# Patient Record
Sex: Female | Born: 1990
Health system: Southern US, Community
[De-identification: ages and names within clinical notes are randomized; demographics above are authoritative.]

## PROBLEM LIST (undated history)

## (undated) DIAGNOSIS — G43909 Migraine, unspecified, not intractable, without status migrainosus: Secondary | ICD-10-CM

## (undated) DIAGNOSIS — F32A Depression, unspecified: Secondary | ICD-10-CM

## (undated) DIAGNOSIS — G479 Sleep disorder, unspecified: Secondary | ICD-10-CM

## (undated) DIAGNOSIS — F329 Major depressive disorder, single episode, unspecified: Secondary | ICD-10-CM

## (undated) DIAGNOSIS — I2699 Other pulmonary embolism without acute cor pulmonale: Secondary | ICD-10-CM

## (undated) DIAGNOSIS — D219 Benign neoplasm of connective and other soft tissue, unspecified: Secondary | ICD-10-CM

## (undated) DIAGNOSIS — M549 Dorsalgia, unspecified: Secondary | ICD-10-CM

## (undated) DIAGNOSIS — K219 Gastro-esophageal reflux disease without esophagitis: Secondary | ICD-10-CM

## (undated) DIAGNOSIS — R0602 Shortness of breath: Secondary | ICD-10-CM

## (undated) DIAGNOSIS — R7303 Prediabetes: Secondary | ICD-10-CM

## (undated) DIAGNOSIS — E119 Type 2 diabetes mellitus without complications: Secondary | ICD-10-CM

## (undated) DIAGNOSIS — H43399 Other vitreous opacities, unspecified eye: Secondary | ICD-10-CM

## (undated) DIAGNOSIS — M542 Cervicalgia: Secondary | ICD-10-CM

## (undated) DIAGNOSIS — I10 Essential (primary) hypertension: Secondary | ICD-10-CM

## (undated) DIAGNOSIS — R5383 Other fatigue: Secondary | ICD-10-CM

## (undated) DIAGNOSIS — F419 Anxiety disorder, unspecified: Secondary | ICD-10-CM

## (undated) DIAGNOSIS — B999 Unspecified infectious disease: Secondary | ICD-10-CM

## (undated) HISTORY — DX: Gastro-esophageal reflux disease without esophagitis: K21.9

## (undated) HISTORY — DX: Cervicalgia: M54.2

## (undated) HISTORY — DX: Dorsalgia, unspecified: M54.9

## (undated) HISTORY — DX: Essential (primary) hypertension: I10

## (undated) HISTORY — DX: Shortness of breath: R06.02

## (undated) HISTORY — DX: Depression, unspecified: F32.A

## (undated) HISTORY — DX: Other fatigue: R53.83

## (undated) HISTORY — DX: Major depressive disorder, single episode, unspecified: F32.9

## (undated) HISTORY — DX: Other vitreous opacities, unspecified eye: H43.399

## (undated) HISTORY — DX: Prediabetes: R73.03

## (undated) HISTORY — PX: WISDOM TOOTH EXTRACTION: SHX21

## (undated) HISTORY — DX: Sleep disorder, unspecified: G47.9

## (undated) HISTORY — DX: Anxiety disorder, unspecified: F41.9

---

## 2009-08-17 ENCOUNTER — Emergency Department (HOSPITAL_BASED_OUTPATIENT_CLINIC_OR_DEPARTMENT_OTHER): Admission: EM | Admit: 2009-08-17 | Discharge: 2009-08-17 | Payer: Self-pay | Admitting: Emergency Medicine

## 2010-10-22 ENCOUNTER — Emergency Department (HOSPITAL_BASED_OUTPATIENT_CLINIC_OR_DEPARTMENT_OTHER)
Admission: EM | Admit: 2010-10-22 | Discharge: 2010-10-22 | Disposition: A | Discharge status: Home / Self Care | Payer: Self-pay | Attending: Emergency Medicine | Admitting: Emergency Medicine

## 2010-10-22 DIAGNOSIS — J45909 Unspecified asthma, uncomplicated: Secondary | ICD-10-CM | POA: Insufficient documentation

## 2010-10-22 DIAGNOSIS — R109 Unspecified abdominal pain: Secondary | ICD-10-CM | POA: Insufficient documentation

## 2010-10-22 LAB — URINALYSIS, ROUTINE W REFLEX MICROSCOPIC
Bilirubin Urine: NEGATIVE
Glucose, UA: NEGATIVE mg/dL
Hgb urine dipstick: NEGATIVE
Ketones, ur: NEGATIVE mg/dL
Protein, ur: NEGATIVE mg/dL
pH: 6 (ref 5.0–8.0)

## 2010-10-22 LAB — WET PREP, GENITAL: Trich, Wet Prep: NONE SEEN

## 2010-10-24 LAB — GC/CHLAMYDIA PROBE AMP, GENITAL
Chlamydia, DNA Probe: NEGATIVE
GC Probe Amp, Genital: NEGATIVE

## 2011-07-22 ENCOUNTER — Encounter: Payer: Self-pay | Admitting: Emergency Medicine

## 2011-07-22 ENCOUNTER — Emergency Department (HOSPITAL_BASED_OUTPATIENT_CLINIC_OR_DEPARTMENT_OTHER)
Admission: EM | Admit: 2011-07-22 | Discharge: 2011-07-22 | Disposition: A | Discharge status: Home / Self Care | Payer: Self-pay | Attending: Emergency Medicine | Admitting: Emergency Medicine

## 2011-07-22 DIAGNOSIS — A499 Bacterial infection, unspecified: Secondary | ICD-10-CM | POA: Insufficient documentation

## 2011-07-22 DIAGNOSIS — J45909 Unspecified asthma, uncomplicated: Secondary | ICD-10-CM | POA: Insufficient documentation

## 2011-07-22 DIAGNOSIS — N760 Acute vaginitis: Secondary | ICD-10-CM | POA: Insufficient documentation

## 2011-07-22 DIAGNOSIS — B9689 Other specified bacterial agents as the cause of diseases classified elsewhere: Secondary | ICD-10-CM | POA: Insufficient documentation

## 2011-07-22 LAB — URINALYSIS, ROUTINE W REFLEX MICROSCOPIC
Bilirubin Urine: NEGATIVE
Ketones, ur: 15 mg/dL — AB
Nitrite: NEGATIVE
Protein, ur: NEGATIVE mg/dL
pH: 7 (ref 5.0–8.0)

## 2011-07-22 LAB — URINE MICROSCOPIC-ADD ON

## 2011-07-22 LAB — WET PREP, GENITAL

## 2011-07-22 MED ORDER — HYDROCODONE-ACETAMINOPHEN 5-500 MG PO TABS: 1.0000 | ORAL_TABLET | Freq: Four times a day (QID) | ORAL | Status: AC | PRN

## 2011-07-22 MED ORDER — METRONIDAZOLE 500 MG PO TABS: 500.0000 mg | ORAL_TABLET | Freq: Two times a day (BID) | ORAL | Status: AC

## 2011-07-22 NOTE — ED Notes (Signed)
Pt c/o RLQ for " a month or two"; sts pain is getting worse; +vag d/c; denies urinary sx

## 2011-07-22 NOTE — ED Provider Notes (Signed)
History     CSN: [id number]  Arrival date & time 07/22/11  1043   First MD Initiated Contact with Patient 07/22/11 1202      Chief Complaint  Patient presents with  . Abdominal Pain    (Consider location/radiation/quality/duration/timing/severity/associated sxs/prior treatment) HPI Comments: Pt states that she has had intermittent rlq abdominal pain for 2 months:pt states that the pain was bad this morning and that is what made her come in today:pt states that she is having very little pain at this time:pt denies fever,diarrhea, or anorexia:pt states that she is having a white vaginal discharge  Patient is a 21 y.o. female presenting with abdominal pain. The history is provided by the patient.  Abdominal Pain The primary symptoms of the illness include abdominal pain and vaginal discharge. The primary symptoms of the illness do not include nausea, vomiting, dysuria or vaginal bleeding. The current episode started more than 2 days ago. The onset of the illness was gradual. The problem has not changed since onset. The vaginal discharge is not associated with dysuria.  The patient states that she believes she is currently not pregnant. The patient has not had a change in bowel habit. Symptoms associated with the illness do not include urgency, hematuria, frequency or back pain. Significant associated medical issues do not include PUD or liver disease.    Past Medical History  Diagnosis Date  . Asthma     Past Surgical History  Procedure Date  . Cesarean section     No family history on file.  History  Substance Use Topics  . Smoking status: Never Smoker   . Smokeless tobacco: Not on file  . Alcohol Use: No    OB History    Grav Para Term Preterm Abortions TAB SAB Ect Mult Living                  Review of Systems  Gastrointestinal: Positive for abdominal pain. Negative for nausea and vomiting.  Genitourinary: Positive for vaginal discharge. Negative for dysuria,  urgency, frequency, hematuria and vaginal bleeding.  Musculoskeletal: Negative for back pain.  All other systems reviewed and are negative.    Allergies  Review of patient's allergies indicates no known allergies.  Home Medications  No current outpatient prescriptions on file.  BP 125/75  Pulse 77  Temp(Src) 98.5 F (36.9 C) (Oral)  Resp 18  Ht 5\' 2"  (1.575 m)  Wt 202 lb (91.627 kg)  BMI 36.95 kg/m2  SpO2 100%  Physical Exam  Nursing note and vitals reviewed. Constitutional: She is oriented to person, place, and time. She appears well-developed and well-nourished.  HENT:  Head: Normocephalic and atraumatic.  Neck: Normal range of motion. Neck supple.  Cardiovascular: Normal rate and regular rhythm.   Pulmonary/Chest: Effort normal and breath sounds normal.  Abdominal: Soft. Bowel sounds are normal. There is no tenderness. There is no rebound and no guarding.  Genitourinary: Cervix exhibits no motion tenderness. Vaginal discharge found.  Musculoskeletal: Normal range of motion.  Neurological: She is alert and oriented to person, place, and time.  Skin: Skin is warm and dry.  Psychiatric: She has a normal mood and affect.    ED Course  Procedures (including critical care time)  Labs Reviewed  URINALYSIS, ROUTINE W REFLEX MICROSCOPIC - Abnormal; Notable for the following:    Color, Urine AMBER (*) BIOCHEMICALS MAY BE AFFECTED BY COLOR   APPearance CLOUDY (*)    Specific Gravity, Urine 1.031 (*)    Ketones, ur 15 (*)  Leukocytes, UA TRACE (*)    All other components within normal limits  WET PREP, GENITAL - Abnormal; Notable for the following:    Clue Cells, Wet Prep TOO NUMEROUS TO COUNT (*)    WBC, Wet Prep HPF POC MANY (*)    All other components within normal limits  URINE MICROSCOPIC-ADD ON - Abnormal; Notable for the following:    Squamous Epithelial / LPF MANY (*)    Bacteria, UA MANY (*)    All other components within normal limits  PREGNANCY, URINE    GC/CHLAMYDIA PROBE AMP, GENITAL   No results found.   1. BV (bacterial vaginosis)       MDM  Will treat for bv,urine contaminated:abdominal exam non acute:pt can follow up with [facility] for continued symptoms        Teressa Lower, NP 07/22/11 1359

## 2011-07-23 NOTE — ED Provider Notes (Signed)
Medical screening examination/treatment/procedure(s) were performed by non-physician practitioner and as supervising physician I was immediately available for consultation/collaboration.   Allante Beane A. Riot Barrick, MD 07/23/11 0701 

## 2011-07-24 LAB — GC/CHLAMYDIA PROBE AMP, GENITAL: Chlamydia, DNA Probe: NEGATIVE

## 2011-11-06 ENCOUNTER — Emergency Department (HOSPITAL_BASED_OUTPATIENT_CLINIC_OR_DEPARTMENT_OTHER)
Admission: EM | Admit: 2011-11-06 | Discharge: 2011-11-06 | Disposition: A | Discharge status: Home / Self Care | Payer: Self-pay | Attending: Emergency Medicine | Admitting: Emergency Medicine

## 2011-11-06 ENCOUNTER — Encounter (HOSPITAL_BASED_OUTPATIENT_CLINIC_OR_DEPARTMENT_OTHER): Payer: Self-pay | Admitting: Family Medicine

## 2011-11-06 DIAGNOSIS — J45909 Unspecified asthma, uncomplicated: Secondary | ICD-10-CM | POA: Insufficient documentation

## 2011-11-06 DIAGNOSIS — R109 Unspecified abdominal pain: Secondary | ICD-10-CM | POA: Insufficient documentation

## 2011-11-06 LAB — URINALYSIS, ROUTINE W REFLEX MICROSCOPIC
Bilirubin Urine: NEGATIVE
Glucose, UA: NEGATIVE mg/dL
Hgb urine dipstick: NEGATIVE
Protein, ur: NEGATIVE mg/dL
Specific Gravity, Urine: 1.031 — ABNORMAL HIGH (ref 1.005–1.030)

## 2011-11-06 MED ORDER — OXYCODONE-ACETAMINOPHEN 5-325 MG PO TABS: 1.0000 | ORAL_TABLET | ORAL | Status: AC | PRN

## 2011-11-06 MED ORDER — IBUPROFEN 200 MG PO TABS
ORAL_TABLET | ORAL | Status: AC
  Filled 2011-11-06: qty 2

## 2011-11-06 MED ORDER — ONDANSETRON 8 MG PO TBDP
8.0000 mg | ORAL_TABLET | Freq: Once | ORAL | Status: AC
  Administered 2011-11-06: 8 mg via ORAL
  Filled 2011-11-06: qty 1

## 2011-11-06 MED ORDER — IBUPROFEN 400 MG PO TABS
ORAL_TABLET | ORAL | Status: AC
  Administered 2011-11-06: 800 mg
  Filled 2011-11-06: qty 2

## 2011-11-06 MED ORDER — OXYCODONE-ACETAMINOPHEN 5-325 MG PO TABS
2.0000 | ORAL_TABLET | Freq: Once | ORAL | Status: AC
  Administered 2011-11-06: 2 via ORAL
  Filled 2011-11-06: qty 2

## 2011-11-06 NOTE — ED Provider Notes (Signed)
History     CSN: [id number]  Arrival date & time 11/06/11  2130   First MD Initiated Contact with Patient 11/06/11 [phone]      Chief Complaint  Patient presents with  . Flank Pain     Patient is a 21 y.o. female presenting with flank pain. The history is provided by the patient.  Flank Pain This is a recurrent problem. The problem occurs hourly. The problem has not changed since onset.Associated symptoms include abdominal pain. Pertinent negatives include no chest pain, no headaches and no shortness of breath. Exacerbated by: palpation. The symptoms are relieved by nothing. She has tried nothing for the symptoms.  pt reports right flank and abdominal pain for "awhile" now worse today No vomiting/diarrhea She reports currently on menstrual cycle No fever, no dysuria   Past Medical History  Diagnosis Date  . Asthma     Past Surgical History  Procedure Date  . Cesarean section     No family history on file.  History  Substance Use Topics  . Smoking status: Never Smoker   . Smokeless tobacco: Not on file  . Alcohol Use: No    OB History    Grav Para Term Preterm Abortions TAB SAB Ect Mult Living                  Review of Systems  Respiratory: Negative for shortness of breath.   Cardiovascular: Negative for chest pain.  Gastrointestinal: Positive for abdominal pain.  Genitourinary: Positive for flank pain.  Neurological: Negative for headaches.    Allergies  Review of patient's allergies indicates no known allergies.  Home Medications   Current Outpatient Rx  Name Route Sig Dispense Refill  . IBUPROFEN 800 MG PO TABS Oral Take 800 mg by mouth every 8 (eight) hours as needed.      BP 133/85  Pulse 76  Temp(Src) 98.7 F (37.1 C) (Oral)  Resp 20  Ht 5\' 2"  (1.575 m)  Wt 194 lb (87.998 kg)  BMI 35.48 kg/m2  SpO2 100%  Physical Exam CONSTITUTIONAL: Well developed/well nourished HEAD AND FACE: Normocephalic/atraumatic EYES: EOMI/PERRL ENMT: Mucous  membranes moist NECK: supple no meningeal signs SPINE:entire spine nontender CV: S1/S2 noted, no murmurs/rubs/gallops noted LUNGS: Lungs are clear to auscultation bilaterally, no apparent distress ABDOMEN: soft, nontender, no rebound or guarding GU:no cva tenderness Chaperone present.  No cmt.  No adnexal mass/tenderness noted.  No vag bleeding NEURO: Pt is awake/alert, moves all extremitiesx4 EXTREMITIES: pulses normal, full ROM SKIN: warm, color normal PSYCH: no abnormalities of mood noted  ED Course  Procedures Labs Reviewed  URINALYSIS, ROUTINE W REFLEX MICROSCOPIC - Abnormal; Notable for the following:    Specific Gravity, Urine 1.031 (*)    All other components within normal limits  PREGNANCY, URINE   9:43 AM Pt resting comfortably watching Tv Suspicion for acute abd/gyn process is low  The patient appears reasonably screened and/or stabilized for discharge and I doubt any other medical condition or other [facility] requiring further screening, evaluation, or treatment in the ED at this time prior to discharge.  Pt did not take percocet, order changed to ibuprofen  MDM  Nursing notes reviewed and considered in documentation All labs/vitals reviewed and considered Previous records reviewed and considered         Joya Gaskins, MD 11/06/11 [phone]

## 2011-11-06 NOTE — Discharge Instructions (Signed)
Abdominal (belly) pain can be caused by many things. any cases can be observed and treated at home after initial evaluation in the emergency department. Even though you are being discharged home, abdominal pain can be unpredictable. Therefore, you need a repeated exam if your pain does not resolve, returns, or worsens. Most patients with abdominal pain don't have to be admitted to the hospital or have surgery, but serious problems like appendicitis and gallbladder attacks can start out as nonspecific pain. Many abdominal conditions cannot be diagnosed in one visit, so follow-up evaluations are very important. SEEK IMMEDIATE MEDICAL ATTENTION IF: The pain does not go away or becomes severe, particularly over the next 8-12 hours.  A temperature above 100.56F develops.  Repeated vomiting occurs (multiple episodes).  The pain becomes localized to portions of the abdomen. The right side could possibly be appendicitis. In an adult, the left lower portion of the abdomen could be colitis or diverticulitis.  Blood is being passed in stools or vomit (bright red or black tarry stools).  Return also if you develop chest pain, difficulty breathing, dizziness or fainting, or become confused, poorly responsive, or inconsolable (young children).  RESOURCE GUIDE  Dental Problems  Patients with Medicaid: [facility] [phone] [address]                                           [phone] [address] Phone:  [phone]                                                  Phone:  [phone]  If unable to pay or uninsured, contact:  [facility] or [facility]. to become qualified for the adult dental clinic.  Chronic Pain Problems Contact [facility]  [phone] Patients need to be referred by their primary care doctor.  Insufficient Money for Medicine Contact [employer]:  call "211" or [facility] [phone].  No Primary Care  Doctor Call [facility]  [phone] Other agencies that provide inexpensive medical care    [facility]  [phone]    [facility]  [phone]    [facility]  [phone]    [facility]  [phone]    [facility]  [phone]    [facility]  [phone]  Psychological Services [facility]  [phone] [facility]  [phone] [facility]   [phone] (emergency services [phone])  Substance Abuse Resources [facility]  [phone] [facility] 7058282639 [facility] [phone] Floydene Flock [phone] Residential & Outpatient Substance Abuse Program  [phone]  Abuse/Neglect [facility] Child Abuse Hotline 279-525-6061 [facility] Child Abuse Hotline [phone] (After Hours)  Emergency Shelter [facility] [phone]  Maternity Homes [facility] [place] [facility] [phone] [facility] [phone]  MRSA Hotline #:   (520) 503-5172    [facility]  [facility] Dept. [address]      [place] Phone:  [phone]                                   Phone:  [phone]                 Phone:  [place] Phone:  [place] [phone] (After Hours)

## 2011-11-06 NOTE — ED Notes (Signed)
Pt had percocet ordered package opened pt tell RN she is driving and doesn't have a ride verified with MD order changed to ibuprofen 800 mg

## 2011-11-06 NOTE — ED Notes (Signed)
Pt c/o right flank pain "for a while" but worse today. Pt sts she stopped depo and started her period on Saturday. Pt reports "cramping type pain intermittent".

## 2011-11-07 LAB — GC/CHLAMYDIA PROBE AMP, GENITAL
Chlamydia, DNA Probe: NEGATIVE
GC Probe Amp, Genital: NEGATIVE

## 2013-01-27 ENCOUNTER — Emergency Department (HOSPITAL_BASED_OUTPATIENT_CLINIC_OR_DEPARTMENT_OTHER)
Admission: EM | Admit: 2013-01-27 | Discharge: 2013-01-27 | Disposition: A | Discharge status: Home / Self Care | Payer: Self-pay | Attending: Emergency Medicine | Admitting: Emergency Medicine

## 2013-01-27 ENCOUNTER — Encounter (HOSPITAL_BASED_OUTPATIENT_CLINIC_OR_DEPARTMENT_OTHER): Payer: Self-pay | Admitting: Family Medicine

## 2013-01-27 DIAGNOSIS — N949 Unspecified condition associated with female genital organs and menstrual cycle: Secondary | ICD-10-CM | POA: Insufficient documentation

## 2013-01-27 DIAGNOSIS — M549 Dorsalgia, unspecified: Secondary | ICD-10-CM | POA: Insufficient documentation

## 2013-01-27 DIAGNOSIS — J45909 Unspecified asthma, uncomplicated: Secondary | ICD-10-CM | POA: Insufficient documentation

## 2013-01-27 DIAGNOSIS — R102 Pelvic and perineal pain: Secondary | ICD-10-CM

## 2013-01-27 DIAGNOSIS — Z9889 Other specified postprocedural states: Secondary | ICD-10-CM | POA: Insufficient documentation

## 2013-01-27 DIAGNOSIS — N898 Other specified noninflammatory disorders of vagina: Secondary | ICD-10-CM | POA: Insufficient documentation

## 2013-01-27 DIAGNOSIS — Z3202 Encounter for pregnancy test, result negative: Secondary | ICD-10-CM | POA: Insufficient documentation

## 2013-01-27 DIAGNOSIS — R112 Nausea with vomiting, unspecified: Secondary | ICD-10-CM | POA: Insufficient documentation

## 2013-01-27 LAB — WET PREP, GENITAL: Yeast Wet Prep HPF POC: NONE SEEN

## 2013-01-27 LAB — URINALYSIS, ROUTINE W REFLEX MICROSCOPIC
Nitrite: NEGATIVE
Specific Gravity, Urine: 1.031 — ABNORMAL HIGH (ref 1.005–1.030)
Urobilinogen, UA: 1 mg/dL (ref 0.0–1.0)
pH: 5.5 (ref 5.0–8.0)

## 2013-01-27 LAB — PREGNANCY, URINE: Preg Test, Ur: NEGATIVE

## 2013-01-27 MED ORDER — FLUCONAZOLE 50 MG PO TABS
150.0000 mg | ORAL_TABLET | Freq: Once | ORAL | Status: AC
  Administered 2013-01-27: 150 mg via ORAL
  Filled 2013-01-27: qty 1

## 2013-01-27 NOTE — ED Notes (Signed)
Pt c/o cramping x 3 wks to abdomen and low back.

## 2013-01-27 NOTE — ED Provider Notes (Addendum)
History     This chart was scribed for Danielle Sprout, MD by Jiles Prows, ED Scribe. The patient was seen in room MH03/MH03 and the patient's care was started at 4:02 PM.  CSN: [id number] Arrival date & time 01/27/13  1544   Chief Complaint  Patient presents with  . Abdominal Cramping   The history is provided by the patient and medical records. No language interpreter was used.   HPI Comments: Danielle Garcia is a 22 y.o. female who presents to the Emergency Department complaining of intermittent, moderate cramping to abdomen that radiates to lower back onset 3 weeks ago.  Pt reports that episodes last about 15 minutes and have about an hour between episodes.  Pt reports LMP in October after her Depo shot.  Pt reports she was supposed to get another Depo shot in January, but has not.  Pt reports she vomited once last week.  She states that she had a negative pregnancy test last week.  She has one child, (2 years ago), with normal pregnancy.  Pt denies headache, diaphoresis, fever, chills, diarrhea, weakness, cough, SOB and any other pain.   Past Medical History  Diagnosis Date  . Asthma    Past Surgical History  Procedure Laterality Date  . Cesarean section     No family history on file. History  Substance Use Topics  . Smoking status: Never Smoker   . Smokeless tobacco: Not on file  . Alcohol Use: No   OB History   Grav Para Term Preterm Abortions TAB SAB Ect Mult Living                 Review of Systems  Gastrointestinal: Positive for nausea, vomiting and abdominal pain.  Genitourinary: Positive for flank pain.  Musculoskeletal: Positive for back pain.   A complete 10 system review of systems was obtained and all systems are negative except as noted in the HPI and PMH.   Allergies  Review of patient's allergies indicates no known allergies.  Home Medications   Current Outpatient Rx  Name  Route  Sig  Dispense  Refill  . ibuprofen (ADVIL,MOTRIN) 800 MG tablet    Oral   Take 800 mg by mouth every 8 (eight) hours as needed.          BP 137/83  Pulse 88  Temp(Src) 98.6 F (37 C) (Oral)  Resp 18  Ht 5\' 2"  (1.575 m)  Wt 200 lb (90.719 kg)  BMI 36.57 kg/m2  SpO2 100% Physical Exam  Nursing note and vitals reviewed. Constitutional: She is oriented to person, place, and time. She appears well-developed and well-nourished. No distress.  HENT:  Head: Normocephalic and atraumatic.  Eyes: EOM are normal.  Neck: Neck supple. No tracheal deviation present.  Cardiovascular: Normal rate.   Pulmonary/Chest: Effort normal. No respiratory distress.  Abdominal: She exhibits no mass. There is tenderness (lower pelvic). There is no rebound.  Mild superpubic tenderness.  Genitourinary: Uterus normal. Cervix exhibits motion tenderness and friability. Right adnexum displays no mass, no tenderness and no fullness. Left adnexum displays no mass, no tenderness and no fullness. Vaginal discharge found.  Curd-like vaginal discharge  Musculoskeletal: Normal range of motion.  Neurological: She is alert and oriented to person, place, and time.  Skin: Skin is warm and dry.  Psychiatric: She has a normal mood and affect. Her behavior is normal.    ED Course  Procedures (including critical care time) DIAGNOSTIC STUDIES: Oxygen Saturation is 100% on RA, normal  by my interpretation.    COORDINATION OF CARE: 4:05 PM - Discussed ED treatment with pt at bedside including urinalysis and pelvic and pt agrees.   4:31 PM - Pelvic Exam: Chaperone present.  Irritation of vaginal canal.  Mild white curd like discharge with no bleeding.  No adnexal masses or tenderness.  No cervical motion tenderness.  Diffuse mild pelvic tenderness.  Labs Reviewed  WET PREP, GENITAL - Abnormal; Notable for the following:    WBC, Wet Prep HPF POC FEW (*)    All other components within normal limits  URINALYSIS, ROUTINE W REFLEX MICROSCOPIC - Abnormal; Notable for the following:    Specific  Gravity, Urine 1.031 (*)    All other components within normal limits  GC/CHLAMYDIA PROBE AMP  PREGNANCY, URINE   No results found. 1. Pelvic pain     MDM   Patient with intermittent lower abdominal cramping that's been ongoing for the last 3 weeks. She had depo in October and has not had a menstrual cycle since even though she did not get a repeat Depakote shot in January. She has only one sexual partner which is only occasionally productive. She denies any vaginal discharge or dysuria. On pelvic exam she has irritation in curd-like discharge in her vaginal vault consistent with yeast but no other significant findings. She has no findings suggestive of PID.  Prep without any clue cells and only few white blood cells low suspicion for STD at this time. We'll treat with Diflucan and Monistat. We'll outpatient followup with OB/GYN if symptoms continue  I personally performed the services described in this documentation, which was scribed in my presence.  The recorded information has been reviewed and considered.   Danielle Sprout, MD 01/27/13 1610  Danielle Sprout, MD 01/27/13 1710

## 2016-09-07 DIAGNOSIS — E559 Vitamin D deficiency, unspecified: Secondary | ICD-10-CM | POA: Diagnosis not present

## 2016-09-07 DIAGNOSIS — I10 Essential (primary) hypertension: Secondary | ICD-10-CM | POA: Diagnosis not present

## 2016-09-07 DIAGNOSIS — R51 Headache: Secondary | ICD-10-CM | POA: Diagnosis not present

## 2016-09-13 MED FILL — LISINOPRIL-HCTZ 20-12.5 MG: 20-12.5 | 90 days supply | Qty: 90 | Fill #0

## 2016-09-25 ENCOUNTER — Encounter: Payer: Self-pay | Admitting: Medical

## 2016-09-25 ENCOUNTER — Ambulatory Visit (INDEPENDENT_AMBULATORY_CARE_PROVIDER_SITE_OTHER): Payer: Self-pay | Admitting: Medical

## 2016-09-25 VITALS — BP 108/72 | HR 84 | Temp 98.1°F | Resp 16 | Ht 62.0 in | Wt 217.0 lb

## 2016-09-25 DIAGNOSIS — R110 Nausea: Secondary | ICD-10-CM

## 2016-09-25 DIAGNOSIS — R142 Eructation: Secondary | ICD-10-CM

## 2016-09-25 DIAGNOSIS — H9201 Otalgia, right ear: Secondary | ICD-10-CM

## 2016-09-25 DIAGNOSIS — R42 Dizziness and giddiness: Secondary | ICD-10-CM

## 2016-09-25 DIAGNOSIS — R1013 Epigastric pain: Secondary | ICD-10-CM

## 2016-09-25 LAB — CBC WITH DIFFERENTIAL/PLATELET
Basophils Absolute: 0 10*3/uL (ref 0.0–0.1)
Basophils Relative: 0.4 % (ref 0.0–3.0)
EOS PCT: 1 % (ref 0.0–5.0)
Eosinophils Absolute: 0 10*3/uL (ref 0.0–0.7)
HCT: 43 % (ref 36.0–46.0)
Hemoglobin: 14.2 g/dL (ref 12.0–15.0)
LYMPHS ABS: 2.6 10*3/uL (ref 0.7–4.0)
Lymphocytes Relative: 61.2 % — ABNORMAL HIGH (ref 12.0–46.0)
MCHC: 33.1 g/dL (ref 30.0–36.0)
MCV: 92 fl (ref 78.0–100.0)
MONO ABS: 0.5 10*3/uL (ref 0.1–1.0)
MONOS PCT: 10.9 % (ref 3.0–12.0)
NEUTROS ABS: 1.1 10*3/uL — AB (ref 1.4–7.7)
Neutrophils Relative %: 26.5 % — ABNORMAL LOW (ref 43.0–77.0)
PLATELETS: 315 10*3/uL (ref 150.0–400.0)
RBC: 4.67 Mil/uL (ref 3.87–5.11)
RDW: 13.6 % (ref 11.5–15.5)
WBC: 4.2 10*3/uL (ref 4.0–10.5)

## 2016-09-25 LAB — COMPREHENSIVE METABOLIC PANEL
ALK PHOS: 44 U/L (ref 39–117)
ALT: 13 U/L (ref 0–35)
AST: 19 U/L (ref 0–37)
Albumin: 4.3 g/dL (ref 3.5–5.2)
BUN: 11 mg/dL (ref 6–23)
CHLORIDE: 99 meq/L (ref 96–112)
CO2: 28 meq/L (ref 19–32)
Calcium: 9.6 mg/dL (ref 8.4–10.5)
Creatinine, Ser: 0.85 mg/dL (ref 0.40–1.20)
GFR: 103.51 mL/min (ref >60.00)
GLUCOSE: 105 mg/dL — AB (ref 70–99)
POTASSIUM: 3.2 meq/L — AB (ref 3.5–5.1)
SODIUM: 135 meq/L (ref 135–145)
Total Bilirubin: 0.6 mg/dL (ref 0.2–1.2)
Total Protein: 7.6 g/dL (ref 6.0–8.3)

## 2016-09-25 LAB — POC URINALSYSI DIPSTICK (AUTOMATED)
Blood, UA: NEGATIVE
GLUCOSE UA: NEGATIVE
LEUKOCYTES UA: NEGATIVE
Nitrite, UA: NEGATIVE
UROBILINOGEN UA: 4 — AB
pH, UA: 6

## 2016-09-25 LAB — POCT URINE PREGNANCY: PREG TEST UR: NEGATIVE

## 2016-09-25 LAB — LIPASE: Lipase: 26 U/L (ref 11.0–59.0)

## 2016-09-25 LAB — AMYLASE: Amylase: 39 U/L (ref 27–131)

## 2016-09-25 MED ORDER — ONDANSETRON 8 MG PO TBDP: 8.0000 mg | ORAL_TABLET | Freq: Three times a day (TID) | ORAL | 0 refills | Status: DC | PRN

## 2016-09-25 MED ORDER — RANITIDINE HCL 150 MG PO CAPS: 150.0000 mg | ORAL_CAPSULE | Freq: Two times a day (BID) | ORAL | 0 refills | Status: DC

## 2016-09-25 MED ORDER — GI COCKTAIL ~~LOC~~
30.0000 mL | Freq: Once | ORAL | Status: AC
  Administered 2016-09-25: 30 mL via ORAL

## 2016-09-25 MED ORDER — AMOXICILLIN-POT CLAVULANATE 875-125 MG PO TABS: 1.0000 | ORAL_TABLET | Freq: Two times a day (BID) | ORAL | 0 refills | Status: DC

## 2016-09-25 MED ORDER — FLUTICASONE PROPIONATE 50 MCG/ACT NA SUSP: 2.0000 | Freq: Every day | NASAL | 1 refills | Status: DC

## 2016-09-25 MED FILL — raNITIdine HCL 150 MG TABS: 150 | 30 days supply | Qty: 60 | Fill #0

## 2016-09-25 MED FILL — ONDANSETRON ODT 8 MG TABLET: 8 | 6 days supply | Qty: 20 | Fill #0

## 2016-09-25 MED FILL — FLUTICASONE PROP 50 MCG SPR: 50 | 30 days supply | Qty: 16 | Fill #0

## 2016-09-25 NOTE — Progress Notes (Signed)
Pre visit review using our clinic review tool, if applicable. No additional management support is needed unless otherwise documented below in the visit note/SLS  

## 2016-09-25 NOTE — Patient Instructions (Addendum)
Your abdomen pain has features of gerd. You mentioned getting labs to [facility] and I think may be good idea as well. Will get cbc, cmp amylase, lipase and h pylori breath test.  GI cocktail today. Rx ranitidine to take twice daily. If not improving by mid week we could switch to ppi.(eat very healthy diet)  For your ear pain this may be related to pressure in the eustachian tube as you appear to have some swelling inside nose and pnd. Rx flonase. If you have severe ear pain again would advise starting augmentin as upper 1/3 of membrane has faint red appearance(But entire membrane not red presently).  Follow up in 7-10 days or as needed

## 2016-09-25 NOTE — Progress Notes (Signed)
Subjective:    Patient ID: Danielle Garcia, female    DOB: 1991/01/07, 26 y.o.   MRN: [id number]  HPI  Pt in with some recent abdomen pain since Saturday. Pain on eating(regardless of food type). Pt feels nausea even without eating. No vomiting. Nausea started this am.(lmp- 09-18-2016 and little early/lasted about the same and seemed normal). No vomiting. Pt states pregnant(6 yrs ago) with daughter had heart burn/reflux.  Pt does states episodes of faint mild epigastric pain with burping. Today on exam burping a lot.   Pt has not taken anything otc for her symptoms.  No constipation or diarrhea. No black or bloody stools.   Some ear pain rt ear pain since yesterday with some dizziness that has been transient on and off. Worse with changing positions sitting to standing. Regains balance after 10 seconds. During exam sitting feels no dizziness. No recent nasal congestion. But then states some sneezing. No history of ear infections.  Pt states ear pain severe last night but faint now.  Review of Systems  Constitutional: Negative for chills and fever.  HENT: Positive for ear pain and sneezing. Negative for congestion and hearing loss.   Respiratory: Negative for cough, chest tightness, shortness of breath and wheezing.   Cardiovascular: Negative for chest pain and palpitations.  Gastrointestinal: Positive for abdominal pain and nausea. Negative for anal bleeding, blood in stool, constipation and diarrhea.       Belching.  Genitourinary: Negative for difficulty urinating, dyspareunia, flank pain, frequency, pelvic pain, urgency and vaginal pain.  Musculoskeletal: Negative for back pain.  Skin: Negative for rash.  Neurological: Positive for dizziness. Negative for weakness and headaches.       Faint and transient. See hpi.  Hematological: Negative for adenopathy. Does not bruise/bleed easily.  Psychiatric/Behavioral: Negative for behavioral problems and confusion.    Past Medical History:    Diagnosis Date  . Asthma   . HTN (hypertension)      Social History   Social History  . Marital status: Single    Spouse name: N/A  . Number of children: N/A  . Years of education: N/A   Occupational History  . Not on file.   Social History Main Topics  . Smoking status: Never Smoker  . Smokeless tobacco: Never Used  . Alcohol use No  . Drug use: No  . Sexual activity: Yes    Birth control/ protection: Implant   Other Topics Concern  . Not on file   Social History Narrative  . No narrative on file    Past Surgical History:  Procedure Laterality Date  . CESAREAN SECTION      Family History  Problem Relation Age of Onset  . Hypertension Mother   . Heart attack Father   . Hypertension Maternal Grandmother   . Diabetes Maternal Grandmother   . Lung cancer Maternal Grandfather   . Diabetes Paternal Grandmother   . Allergies Daughter     No Known Allergies  No current outpatient prescriptions on file prior to visit.   No current facility-administered medications on file prior to visit.     BP 108/72 (BP Location: Left Arm, Patient Position: Sitting, Cuff Size: Large)   Pulse 84   Temp 98.1 F (36.7 C) (Oral)   Resp 16   Ht 5\' 2"  (1.575 m)   Wt 217 lb (98.4 kg)   LMP 09/18/2016   SpO2 99%   BMI 39.69 kg/m       Objective:   Physical Exam  General  Mental Status - Alert. General Appearance - Well groomed. Not in acute distress.  Skin Rashes- No Rashes.  HEENT Head- Normal. Ear Auditory Canal - Left- Normal. Right - Normal.Tympanic Membrane- Left- Normal. Right- upper 1/3 portion tm faint red. Remainder normal and good light reflex. Eye Sclera/Conjunctiva- Left- Normal. Right- Normal. Nose & Sinuses Nasal Mucosa- Left-  Boggy and Congested. Right-  Boggy and  Congested.Bilateral  No maxillary and no  frontal sinus pressure. Mouth & Throat Lips: Upper Lip- Normal: no dryness, cracking, pallor, cyanosis, or vesicular eruption. Lower  Lip-Normal: no dryness, cracking, pallor, cyanosis or vesicular eruption. Buccal Mucosa- Bilateral- No Aphthous ulcers. Oropharynx- No Discharge or Erythema. Tonsils: Characteristics- Bilateral- No Erythema or Congestion. Size/Enlargement- Bilateral- No enlargement. Discharge- bilateral-None.  Neck Neck- Supple. No Masses.   Chest and Lung Exam Auscultation: Breath Sounds:-Clear even and unlabored.  Cardiovascular Auscultation:Rythm- Regular, rate and rhythm. Murmurs & Other Heart Sounds:Ausculatation of the heart reveal- No Murmurs.  Lymphatic Head & Neck General Head & Neck Lymphatics: Bilateral: Description- No Localized lymphadenopathy.    Abdomen Inspection:-Inspection Normal.  Palpation/Perucssion: Palpation and Percussion of the abdomen reveal- faint epigastric Tender(no ruq pain), No Rebound tenderness, No rigidity(Guarding) and No Palpable abdominal masses.  Liver:-Normal.  Spleen:- Normal.       Assessment & Plan:  Your abdomen pain has features of gerd. You mentioned getting labs to [facility] and I think may be good idea as well. Will get cbc, cmp amylase, lipase and h pylori breath test.  GI cocktail today. Rx ranitidine to take twice daily. If not improving by mid week we could switch to ppi.(eat very healthy diet)  For your ear pain this may be related to pressure in the eustachian tube as you appear to have some swelling inside nose and pnd. Rx flonase. If you have severe ear pain again would advise starting augmentin as upper 1/3 of membrane has faint red appearance(But entire membrane not red presently).  Follow up in 7-10 days or as needed  Considered using antivert for dizziness if you get more constant dizziness. Currently I think it would oversedate you.  Will follow labs and pain level. Consider getting [place] this week if symptoms persist.  Labron Bloodgood, Percell Miller, PA-C

## 2016-09-26 ENCOUNTER — Telehealth: Payer: Self-pay | Admitting: Medical

## 2016-09-26 ENCOUNTER — Encounter: Payer: Self-pay | Admitting: *Deleted

## 2016-09-26 LAB — CULTURE, URINE COMPREHENSIVE

## 2016-09-26 LAB — H. PYLORI BREATH TEST: H. PYLORI BREATH TEST: NOT DETECTED

## 2016-09-26 MED ORDER — POTASSIUM CHLORIDE ER 10 MEQ PO TBCR: 10.0000 meq | EXTENDED_RELEASE_TABLET | Freq: Every day | ORAL | 0 refills | Status: DC

## 2016-09-26 MED FILL — POTASSIUM CL 10 MEQ TAB SA: 10 | 5 days supply | Qty: 5 | Fill #0

## 2016-09-26 NOTE — Telephone Encounter (Signed)
Patient informed, understood & agreed/SLS 03/13

## 2016-09-26 NOTE — Telephone Encounter (Signed)
Pt wbc was not elevated. But neutrophils were decrease would recommend repeating cbc in 2 wks when feeling better. Amylase and lipase normal. H pylori was negative. Pt sugar was mild increased and k was mild low. I sent in 5 tabs of kdur to bring k up quickly. How is her stomach pain. If by wed not improved with ranitidine could switch to omeprazole. Also if abdomen pain persists by next week or if even worse now could order fasting [place] evaluate gallbladder.

## 2016-09-28 ENCOUNTER — Telehealth: Payer: Self-pay | Admitting: *Deleted

## 2016-09-28 NOTE — Telephone Encounter (Signed)
Patient requested new MyChart activation information. She misplaced AVS that was given at office visit earlier this week. MyChart activation code sent via text.

## 2016-10-19 MED FILL — PHENTERMINE 37.5 MG TABLET: 37.5 | 30 days supply | Qty: 30 | Fill #0

## 2016-10-26 ENCOUNTER — Telehealth: Payer: Self-pay | Admitting: Medical

## 2016-10-26 ENCOUNTER — Other Ambulatory Visit (INDEPENDENT_AMBULATORY_CARE_PROVIDER_SITE_OTHER): Payer: 59

## 2016-10-26 DIAGNOSIS — R739 Hyperglycemia, unspecified: Secondary | ICD-10-CM | POA: Diagnosis not present

## 2016-10-26 LAB — HEMOGLOBIN A1C: Hgb A1c MFr Bld: 5.4 % (ref 4.6–6.5)

## 2016-10-26 NOTE — Telephone Encounter (Signed)
Sugar earlier today was 160. Will get a1c. Order placed.

## 2016-11-02 MED FILL — IBUPROFEN 800 MG TABLET: 800 | 10 days supply | Qty: 30 | Fill #0

## 2016-11-10 ENCOUNTER — Ambulatory Visit (INDEPENDENT_AMBULATORY_CARE_PROVIDER_SITE_OTHER): Payer: 59 | Admitting: Family Medicine

## 2016-11-10 ENCOUNTER — Encounter: Payer: Self-pay | Admitting: Family Medicine

## 2016-11-10 VITALS — BP 121/87 | HR 60 | Temp 97.9°F | Ht 62.0 in | Wt 217.5 lb

## 2016-11-10 DIAGNOSIS — N3090 Cystitis, unspecified without hematuria: Secondary | ICD-10-CM | POA: Diagnosis not present

## 2016-11-10 DIAGNOSIS — G43809 Other migraine, not intractable, without status migrainosus: Secondary | ICD-10-CM

## 2016-11-10 LAB — POC URINALSYSI DIPSTICK (AUTOMATED)
BILIRUBIN UA: NEGATIVE
GLUCOSE UA: NEGATIVE
Ketones, UA: NEGATIVE
LEUKOCYTES UA: NEGATIVE
NITRITE UA: NEGATIVE
PH UA: 6 (ref 5.0–8.0)
Protein, UA: NEGATIVE
Spec Grav, UA: >=1.03 — AB (ref 1.010–1.025)
Urobilinogen, UA: 0.2 E.U./dL

## 2016-11-10 MED ORDER — KETOROLAC TROMETHAMINE 60 MG/2ML IM SOLN
60.0000 mg | Freq: Once | INTRAMUSCULAR | Status: AC
  Administered 2016-11-10: 60 mg via INTRAMUSCULAR

## 2016-11-10 MED ORDER — SUMATRIPTAN SUCCINATE 100 MG PO TABS: 100.0000 mg | ORAL_TABLET | ORAL | 1 refills | Status: DC | PRN

## 2016-11-10 MED ORDER — NITROFURANTOIN MONOHYD MACRO 100 MG PO CAPS: 100.0000 mg | ORAL_CAPSULE | Freq: Two times a day (BID) | ORAL | 0 refills | Status: DC

## 2016-11-10 MED FILL — NITROFURANTOIN MONO-MCR 100: 100 | 5 days supply | Qty: 10 | Fill #0

## 2016-11-10 MED FILL — SUMATRIPTAN SUCC 100 MG TAB: 100 | 30 days supply | Qty: 9 | Fill #0

## 2016-11-10 NOTE — Progress Notes (Signed)
Chief Complaint  Patient presents with  . Headache    Danielle Garcia is a 26 y.o. female here for possible UTI.  Duration: 1 day. Symptoms: urinary frequency and pain with urination Denies: hematuria, urinary retention, fever, urinary incontinence and flank pain, vaginal discharge Has had UTI's in past, this is typical presentation Hx of recurrent UTI? No Denies new sexual partners.  The patient also has a history of migraines. She has her typical bilateral frontal headache with associated nausea. No numbness, tingling, vision changes, or balance issues. She's tried over-the-counter measures without relief. She used to be on Imitrex in the past, however has not had her prescription recently.  ROS:  Constitutional: denies fever GU: As noted in HPI MSK: Denies back pain Abd: Denies constipation or abdominal pain  Past Medical History:  Diagnosis Date  . Asthma   . HTN (hypertension)    Family History  Problem Relation Age of Onset  . Hypertension Mother   . Heart attack Father   . Hypertension Maternal Grandmother   . Diabetes Maternal Grandmother   . Lung cancer Maternal Grandfather   . Diabetes Paternal Grandmother   . Allergies Daughter    Social History   Social History  . Marital status: Single   Social History Main Topics  . Smoking status: Never Smoker  . Smokeless tobacco: Never Used  . Alcohol use No  . Drug use: No  . Sexual activity: Yes    Birth control/ protection: Implant    BP 121/87   Pulse 60   Temp 97.9 F (36.6 C) (Other (Comment))   Ht 5\' 2"  (1.575 m)   Wt 217 lb 8 oz (98.7 kg)   SpO2 100%   BMI 39.78 kg/m  General: Awake, alert, appears stated age HEENT: MMM Heart: RRR, no murmurs Lungs: CTAB, normal respiratory effort, no accessory muscle usage Abd: BS+, soft, NT, ND, no masses or organomegaly MSK: No CVA tenderness, neg Lloyd's sign, no cerv paraspinal muscle TTP Psych: Age appropriate judgment and insight  Cystitis - Plan:  nitrofurantoin, macrocrystal-monohydrate, (MACROBID) 100 MG capsule  Other migraine without status migrainosus, not intractable - Plan: SUMAtriptan (IMITREX) 100 MG tablet, ketorolac (TORADOL) injection 60 mg  Orders as above. Will treat given symptoms, will culture urine for thoroughness. Toradol today, will give Imitrex for abortive therapy. If having more frequent headaches, will discuss starting prophylaxis. F/u in 7 days if symptoms fail to improve, sooner if things worsen. The patient voiced understanding and agreement to the plan.  [place], DO 11/10/16 4:07 PM

## 2016-11-10 NOTE — Progress Notes (Signed)
Pre visit review using our clinic tool,if applicable. No additional management support is needed unless otherwise documented below in the visit note.  

## 2016-11-12 LAB — URINE CULTURE

## 2016-11-18 DIAGNOSIS — R110 Nausea: Secondary | ICD-10-CM | POA: Diagnosis not present

## 2016-11-18 DIAGNOSIS — J45909 Unspecified asthma, uncomplicated: Secondary | ICD-10-CM | POA: Diagnosis not present

## 2016-11-18 DIAGNOSIS — Z7951 Long term (current) use of inhaled steroids: Secondary | ICD-10-CM | POA: Diagnosis not present

## 2016-11-18 DIAGNOSIS — Z87891 Personal history of nicotine dependence: Secondary | ICD-10-CM | POA: Diagnosis not present

## 2016-11-18 DIAGNOSIS — R42 Dizziness and giddiness: Secondary | ICD-10-CM | POA: Diagnosis not present

## 2016-11-18 DIAGNOSIS — R51 Headache: Secondary | ICD-10-CM | POA: Diagnosis not present

## 2016-11-18 DIAGNOSIS — I10 Essential (primary) hypertension: Secondary | ICD-10-CM | POA: Diagnosis not present

## 2016-11-18 DIAGNOSIS — Z79899 Other long term (current) drug therapy: Secondary | ICD-10-CM | POA: Diagnosis not present

## 2016-11-20 ENCOUNTER — Ambulatory Visit (INDEPENDENT_AMBULATORY_CARE_PROVIDER_SITE_OTHER): Payer: 59 | Admitting: Medical

## 2016-11-20 VITALS — BP 116/72 | HR 79 | Temp 97.9°F | Ht 62.0 in | Wt 216.4 lb

## 2016-11-20 DIAGNOSIS — J301 Allergic rhinitis due to pollen: Secondary | ICD-10-CM

## 2016-11-20 DIAGNOSIS — J0100 Acute maxillary sinusitis, unspecified: Secondary | ICD-10-CM

## 2016-11-20 DIAGNOSIS — H9203 Otalgia, bilateral: Secondary | ICD-10-CM | POA: Diagnosis not present

## 2016-11-20 DIAGNOSIS — R0981 Nasal congestion: Secondary | ICD-10-CM | POA: Diagnosis not present

## 2016-11-20 MED ORDER — AMOXICILLIN-POT CLAVULANATE 875-125 MG PO TABS: 1.0000 | ORAL_TABLET | Freq: Two times a day (BID) | ORAL | 0 refills | Status: DC

## 2016-11-20 MED ORDER — FLUTICASONE PROPIONATE 50 MCG/ACT NA SUSP: 2.0000 | Freq: Every day | NASAL | 0 refills | Status: DC

## 2016-11-20 MED ORDER — LEVOCETIRIZINE DIHYDROCHLORIDE 5 MG PO TABS: 5.0000 mg | ORAL_TABLET | Freq: Every evening | ORAL | 0 refills | Status: DC

## 2016-11-20 MED FILL — FLUTICASONE PROP 50 MCG SPR: 50 | 30 days supply | Qty: 16 | Fill #0

## 2016-11-20 MED FILL — LEVOCETIRIZINE 5 MG TABLET: 5 | 30 days supply | Qty: 30 | Fill #0

## 2016-11-20 MED FILL — AMOX-CLAV 875-125 MG TABLET: 875-125 | 10 days supply | Qty: 20 | Fill #0

## 2016-11-20 NOTE — Progress Notes (Signed)
Subjective:    Patient ID: Danielle Garcia, female    DOB: Oct 11, 1990, 26 y.o.   MRN: [id number]  HPI  Pt sick 4 days. Has had ear pressure , sinus pressure and post nasal drainage. Pt went to [facility]. They have steroid injection and iv hydration.   Pressure in sinus and ear is worse.  lmp- late this month. But 2 days ago at urgent care checked and preg test negative. She declines rechecking today.   Review of Systems  Constitutional: Negative for chills and fatigue.  HENT: Positive for congestion, ear pain and sinus pressure.   Respiratory: Negative for cough, chest tightness, shortness of breath and wheezing.   Cardiovascular: Negative for chest pain and palpitations.  Skin: Negative for rash.  Neurological: Negative for dizziness and headaches.  Hematological: Negative for adenopathy. Does not bruise/bleed easily.  Psychiatric/Behavioral: Negative for behavioral problems and confusion.    Past Medical History:  Diagnosis Date  . Asthma   . HTN (hypertension)      Social History   Social History  . Marital status: Single    Spouse name: N/A  . Number of children: N/A  . Years of education: N/A   Occupational History  . Not on file.   Social History Main Topics  . Smoking status: Never Smoker  . Smokeless tobacco: Never Used  . Alcohol use No  . Drug use: No  . Sexual activity: Yes    Birth control/ protection: Implant   Other Topics Concern  . Not on file   Social History Narrative  . No narrative on file    Past Surgical History:  Procedure Laterality Date  . CESAREAN SECTION      Family History  Problem Relation Age of Onset  . Hypertension Mother   . Heart attack Father   . Hypertension Maternal Grandmother   . Diabetes Maternal Grandmother   . Lung cancer Maternal Grandfather   . Diabetes Paternal Grandmother   . Allergies Daughter     No Known Allergies  Current Outpatient Prescriptions on File Prior to Visit    Medication Sig Dispense Refill  . fluticasone (FLONASE) 50 MCG/ACT nasal spray Place 2 sprays into both nostrils daily. 16 g 1  . LISINOPRIL PO Take by mouth.    . nitrofurantoin, macrocrystal-monohydrate, (MACROBID) 100 MG capsule Take 1 capsule (100 mg total) by mouth 2 (two) times daily. 10 capsule 0  . ondansetron (ZOFRAN ODT) 8 MG disintegrating tablet Take 1 tablet (8 mg total) by mouth every 8 (eight) hours as needed for nausea or vomiting. 20 tablet 0  . phentermine 37.5 MG capsule Take 37.5 mg by mouth every morning.    . potassium chloride (K-DUR) 10 MEQ tablet Take 1 tablet (10 mEq total) by mouth daily. 5 tablet 0  . ranitidine (ZANTAC) 150 MG capsule Take 1 capsule (150 mg total) by mouth 2 (two) times daily. 60 capsule 0  . SUMAtriptan (IMITREX) 100 MG tablet Take 1 tablet (100 mg total) by mouth every 2 (two) hours as needed for migraine. May repeat in 2 hours if headache persists or recurs. 10 tablet 1   No current facility-administered medications on file prior to visit.     BP 116/72 (BP Location: Right Arm, Patient Position: Sitting, Cuff Size: Large)   Pulse 79   Temp 97.9 F (36.6 C) (Oral)   Ht 5\' 2"  (1.575 m)   Wt 216 lb 6 oz (98.1 kg)   BMI 39.58 kg/m  Objective:   Physical Exam   General  Mental Status - Alert. General Appearance - Well groomed. Not in acute distress.  Skin Rashes- No Rashes.  HEENT Head- Normal. Ear Auditory Canal - Left- Normal. Right - Normal.Tympanic Membrane- Left- moderate dulll. Right- moderate dull. Eye Sclera/Conjunctiva- Left- Normal. Right- Normal. Nose & Sinuses Nasal Mucosa- .Bilateral moderate maxillary pain on palpation but no  frontal sinus pressure. Mouth & Throat Lips: Upper Lip- Normal: no dryness, cracking, pallor, cyanosis, or vesicular eruption. Lower Lip-Normal: no dryness, cracking, pallor, cyanosis or vesicular eruption. Buccal Mucosa- Bilateral- No Aphthous ulcers. Oropharynx- No Discharge or  Erythema. +pnd. Tonsils: Characteristics- Bilateral- No Erythema or Congestion. Size/Enlargement- Bilateral- No enlargement. Discharge- bilateral-None.  Neck Neck- Supple. No Masses.   Chest and Lung Exam Auscultation: Breath Sounds:-Clear even and unlabored.  Cardiovascular Auscultation:Rythm- Regular, rate and rhythm. Murmurs & Other Heart Sounds:Ausculatation of the heart reveal- No Murmurs.  Lymphatic Head & Neck General Head & Neck Lymphatics: Bilateral: Description- No Localized lymphadenopathy.      Assessment & Plan:  Probable allergic rhinitis with associated  sinus infection and ear pressure/eusutachian tube dysfunction.   Prescribed xyzal antihistamine tabs and flonase nasal spray.  Rx augmentin sent to pharmacy.   Follow up in 7-10 days or as needed  Gracielynn Birkel, Percell Miller, [employer]

## 2016-11-20 NOTE — Patient Instructions (Addendum)
Probable allergic rhinitis with associated  sinus infection and ear pressure/eusutachian tube dysfunction.   Prescribed xyzal antihistamine tabs and flonase nasal spray.  Rx augmentin sent to pharmacy.   Follow up in 7-10 days or as needed

## 2016-11-22 ENCOUNTER — Telehealth: Payer: Self-pay | Admitting: *Deleted

## 2016-11-22 NOTE — Telephone Encounter (Signed)
If augmentin too rough on her stomach. Then stop and if she ok with then send in keflex 500 mg twice daily for 10 days to her pharmacy(will you send in script for me)  Remind her to use probiotics. If get severe watery diarrhea then need to test for c dif.

## 2016-11-22 NOTE — Telephone Encounter (Signed)
Pt states she started augmentin yesterday. Started having diarrhea today and has had 3 episodes today. States stomach is constantly gurgling.  Please advise?

## 2016-11-23 MED ORDER — CEPHALEXIN 500 MG PO CAPS: 500.0000 mg | ORAL_CAPSULE | Freq: Two times a day (BID) | ORAL | 0 refills | Status: DC

## 2016-11-23 NOTE — Telephone Encounter (Signed)
SIgned Rx given to patient.

## 2016-11-23 NOTE — Telephone Encounter (Signed)
Copy of phone note given to patient. States she cannot take Augmentin WIll print Keflex Rx for signature.

## 2016-11-28 MED FILL — PHENTERMINE 37.5 MG TABLET: 37.5 | 30 days supply | Qty: 30 | Fill #1

## 2016-12-15 ENCOUNTER — Ambulatory Visit (INDEPENDENT_AMBULATORY_CARE_PROVIDER_SITE_OTHER): Payer: 59 | Admitting: Family Medicine

## 2016-12-15 VITALS — BP 117/78 | HR 92 | Temp 98.0°F | Ht 62.0 in | Wt 206.6 lb

## 2016-12-15 DIAGNOSIS — B9689 Other specified bacterial agents as the cause of diseases classified elsewhere: Secondary | ICD-10-CM

## 2016-12-15 DIAGNOSIS — Z3009 Encounter for other general counseling and advice on contraception: Secondary | ICD-10-CM | POA: Diagnosis not present

## 2016-12-15 DIAGNOSIS — N760 Acute vaginitis: Secondary | ICD-10-CM

## 2016-12-15 MED ORDER — METRONIDAZOLE 500 MG PO TABS: 500.0000 mg | ORAL_TABLET | Freq: Two times a day (BID) | ORAL | 0 refills | Status: DC

## 2016-12-15 MED FILL — metroNIDAZOLE 500 MG TABS: 500 | 7 days supply | Qty: 14 | Fill #0

## 2016-12-15 NOTE — Progress Notes (Signed)
Pre visit review using our clinic review tool, if applicable. No additional management support is needed unless otherwise documented below in the visit note. 

## 2016-12-15 NOTE — Patient Instructions (Addendum)
No alcohol while on this medication and for at least 72 hours after finishing it.  If you fail to improve or if new symptoms arise, let me know.  Let's plan on the Depo shot for now, I will reach out to a colleague to see if there are any other options. Outlook for other options that you haven't already tried is bleak.

## 2016-12-15 NOTE — Progress Notes (Signed)
Chief Complaint  Patient presents with  . Vaginitis    pt. reported vaginal discharge x 5 days; no color; pt. would like to initiate birth control    Danielle Garcia is a 26 y.o. female here for vaginal discharge.  Duration: 5 days Description of discharge: whitish Odor: Yes New sexual partner: No Urinary complaints: No IUD? No Denies fevers, bleeding, pregnancy abdominal pain.  Pt also wished to discuss birth control. She would like something that she doesn't need to remember to take. She has been on depo shot in past but didn't like associated weight gain. She has also had Implanon and the Mirena and did not like them either.  ROS:  GU: +discharge, denies pain with urination  Past Medical History:  Diagnosis Date  . Asthma   . HTN (hypertension)    Family History  Problem Relation Age of Onset  . Hypertension Mother   . Heart attack Father   . Hypertension Maternal Grandmother   . Diabetes Maternal Grandmother   . Lung cancer Maternal Grandfather   . Diabetes Paternal Grandmother   . Allergies Daughter     BP 117/78 (BP Location: Left Arm, Cuff Size: Large)   Pulse 92   Temp 98 F (36.7 C) (Oral)   Ht 5\' 2"  (1.575 m)   Wt 206 lb 9.6 oz (93.7 kg)   LMP 12/03/2016   SpO2 100%   BMI 37.79 kg/m  Gen: Awake, alert, appears stated age Heart: RRR Lungs: CTAB, no accessory muscle use Abd: BS+, soft, TTP in suprapubic region, ND, no masses or organomegaly GU: not done Psych: Age appropriate judgment and insight, nml mood and affect  Bacterial vaginosis - Plan: metroNIDAZOLE (FLAGYL) 500 MG tablet  Birth control counseling  Orders as above. Pt has episode of VD similar to prev episodes of BV. Will tx. Discussed avoiding alcohol while on this medicine. Discussed BC options, wants kids in future, LARC preferable given poor memory. Will cont with planning for depo shot for now. Recheck preg test in 2 weeks and start injection. Use protection until then. F/u in 1  week if symptoms worsen or fail to improve. Pt voiced understanding and agreement to the plan.  [place], DO 12/15/16 2:47 PM

## 2016-12-21 ENCOUNTER — Other Ambulatory Visit: Payer: Self-pay | Admitting: Family Medicine

## 2016-12-21 MED ORDER — ETONOGESTREL-ETHINYL ESTRADIOL 0.12-0.015 MG/24HR VA RING: VAGINAL_RING | VAGINAL | 12 refills | Status: DC

## 2016-12-21 MED FILL — NUVARING VAGINAL RING: 0.12-0.015 | 84 days supply | Qty: 3 | Fill #0

## 2016-12-21 NOTE — Progress Notes (Signed)
Nuvaring called in per patient's request. TY.

## 2017-01-03 ENCOUNTER — Other Ambulatory Visit (HOSPITAL_COMMUNITY)
Admission: RE | Admit: 2017-01-03 | Discharge: 2017-01-03 | Disposition: A | Discharge status: Home / Self Care | Payer: 59 | Source: Ambulatory Visit | Attending: Family Medicine | Admitting: Family Medicine

## 2017-01-03 ENCOUNTER — Other Ambulatory Visit (INDEPENDENT_AMBULATORY_CARE_PROVIDER_SITE_OTHER): Payer: 59

## 2017-01-03 ENCOUNTER — Telehealth: Payer: Self-pay | Admitting: *Deleted

## 2017-01-03 DIAGNOSIS — Z114 Encounter for screening for human immunodeficiency virus [HIV]: Secondary | ICD-10-CM

## 2017-01-03 DIAGNOSIS — Z113 Encounter for screening for infections with a predominantly sexual mode of transmission: Secondary | ICD-10-CM

## 2017-01-03 LAB — HIV ANTIBODY (ROUTINE TESTING W REFLEX): HIV: NONREACTIVE

## 2017-01-03 NOTE — Telephone Encounter (Signed)
Patient requesting to have lab orders for HIV and STD testing to be completed today prior to OV scheduled for 01/04/17; Thanks/SLS 06/20

## 2017-01-03 NOTE — Telephone Encounter (Signed)
Orders placed.

## 2017-01-04 ENCOUNTER — Ambulatory Visit (INDEPENDENT_AMBULATORY_CARE_PROVIDER_SITE_OTHER): Payer: 59 | Admitting: Family Medicine

## 2017-01-04 ENCOUNTER — Encounter: Payer: Self-pay | Admitting: Family Medicine

## 2017-01-04 ENCOUNTER — Other Ambulatory Visit: Payer: Self-pay

## 2017-01-04 VITALS — BP 124/68 | HR 86 | Temp 98.3°F | Resp 14 | Ht 62.0 in | Wt 198.0 lb

## 2017-01-04 DIAGNOSIS — Z23 Encounter for immunization: Secondary | ICD-10-CM

## 2017-01-04 DIAGNOSIS — L910 Hypertrophic scar: Secondary | ICD-10-CM | POA: Diagnosis not present

## 2017-01-04 DIAGNOSIS — B353 Tinea pedis: Secondary | ICD-10-CM | POA: Diagnosis not present

## 2017-01-04 DIAGNOSIS — B079 Viral wart, unspecified: Secondary | ICD-10-CM

## 2017-01-04 MED ORDER — KETOCONAZOLE 2 % EX CREA: 1.0000 "application " | TOPICAL_CREAM | Freq: Every day | CUTANEOUS | 0 refills | Status: AC

## 2017-01-04 MED FILL — KETOCONAZOLE 2% CREAM: 2 | 30 days supply | Qty: 60 | Fill #0

## 2017-01-04 NOTE — Patient Instructions (Signed)
File down the area on your toe. Use duct tape or compound W. Let us know if you would like [place] to freeze it.  Call [facility] at [phone] for an appointment.  They are located at [address], [place], [place], [address] (right across the hall from our office).

## 2017-01-04 NOTE — Progress Notes (Signed)
Pre visit review using our clinic review tool, if applicable. No additional management support is needed unless otherwise documented below in the visit note. 

## 2017-01-04 NOTE — Progress Notes (Signed)
Chief Complaint  Patient presents with  . Foot Pain    R foot    Subjective: Patient is a 26 y.o. female here for R foot pain and itching.  Patient has had pain on her right pinky toe and itching between her toes for several months. She will generally have to shave down the skin over the area on her pinky toe to keep it from hurting. It does not drain. She states that her feet do sweat a lot. She has never used duct tape or Compound W and has not had cryotherapy.  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 It is cosmetically bothering her and she is back to have it removed. She does not wish to see a specialist at this time. She's never had injections or try to have it removed before.   ROS: Skin: As noted in HPI MSK: +R pinky toe pain  Family History  Problem Relation Age of Onset  . Hypertension Mother   . Heart attack Father   . Hypertension Maternal Grandmother   . Diabetes Maternal Grandmother   . Lung cancer Maternal Grandfather   . Diabetes Paternal Grandmother   . Allergies Daughter    Past Medical History:  Diagnosis Date  . Asthma   . HTN (hypertension)    No Known Allergies  Current Outpatient Prescriptions:  .  ibuprofen (ADVIL,MOTRIN) 800 MG tablet, 800 mg every 8 (eight) hours as needed. , Disp: , Rfl:  .  levocetirizine (XYZAL) 5 MG tablet, Take 1 tablet (5 mg total) by mouth every evening., Disp: 30 tablet, Rfl: 0 .  LISINOPRIL PO, Take by mouth., Disp: , Rfl:  .  phentermine 37.5 MG capsule, Take 37.5 mg by mouth every morning., Disp: , Rfl:  .  ranitidine (ZANTAC) 150 MG capsule, Take 1 capsule (150 mg total) by mouth 2 (two) times daily., Disp: 60 capsule, Rfl: 0 .  ketoconazole (NIZORAL) 2 % cream, Apply 1 application topically daily., Disp: 60 g, Rfl: 0  Objective: BP 124/68 (BP Location: Left Arm, Patient Position: Sitting, Cuff Size: Normal)   Pulse 86   Temp 98.3 F (36.8 C) (Oral)   Resp 14   Ht 5\' 2"  (1.575  m)   Wt 198 lb (89.8 kg)   LMP 01/02/2017 (Exact Date)   SpO2 93%   BMI 36.21 kg/m  General: Awake, appears stated age Lungs: No accessory muscle use MSK: Gait normal Skin: Over apex of L ear helix, there is a small growth with hyperpigmented skin over the top. It is freely moveable. It is non-TTP. No fluctuance or drainage. Between her toes on R and 3-4, 4-5 digits on L, there is macerated skin and scaling. Medial R 5th digit, there is a hyperkeratinized build up that is TTP. No drainage, fluctuance, or erythema.  Psych: Age appropriate judgment and insight, normal affect and mood  Procedure note; keloid injection Verbal consent obtained. The areas of interest was cleaned with alcohol. 0.25 mL were injected at each trigger point with a 50:50 mix of 1% lidocaine w/o epi and Kenalog 40 mg/mL. I could only get in around 0.1 mL. The area was then bandaged. There were no complications noted. The patient tolerated the procedure well.  Assessment and Plan: Tinea pedis of both feet - Plan: ketoconazole (NIZORAL) 2 % cream  Keloid  Viral warts, unspecified type  Need for Tdap vaccination - Plan: Tdap vaccine greater than or equal to 7yo IM  Orders as above. 6 weeks of treatment, if no better will reconsider dx. Tried to inject keloid, but unable to get full volume in area. If no improvement, will try to remove despite high recurrence at patient's request.  For wart, shave down and use duct tape and/or Compound W. Can also freeze or try to cut out, but it is in a difficult spot.  F/u in 4 weeks.  The patient voiced understanding and agreement to the plan.  [place], DO 01/04/17  4:45 PM

## 2017-01-05 DIAGNOSIS — L910 Hypertrophic scar: Secondary | ICD-10-CM | POA: Diagnosis not present

## 2017-01-05 DIAGNOSIS — Z23 Encounter for immunization: Secondary | ICD-10-CM | POA: Diagnosis not present

## 2017-01-05 DIAGNOSIS — B353 Tinea pedis: Secondary | ICD-10-CM | POA: Diagnosis not present

## 2017-01-05 DIAGNOSIS — B079 Viral wart, unspecified: Secondary | ICD-10-CM | POA: Diagnosis not present

## 2017-01-05 LAB — URINE CYTOLOGY ANCILLARY ONLY
Chlamydia: NEGATIVE
NEISSERIA GONORRHEA: NEGATIVE
TRICH (WINDOWPATH): NEGATIVE

## 2017-01-05 MED ORDER — TRIAMCINOLONE ACETONIDE 40 MG/ML IJ SUSP
10.0000 mg | Freq: Once | INTRAMUSCULAR | Status: AC
  Administered 2017-01-05: 10 mg

## 2017-01-05 MED ORDER — LIDOCAINE HCL 1 % IJ SOLN
0.2500 mL | Freq: Once | INTRAMUSCULAR | Status: AC
  Administered 2017-01-05: 0.25 mL

## 2017-01-05 NOTE — Addendum Note (Signed)
Addended by: Harl Bowie on: 01/05/2017 09:56 AM   Modules accepted: Orders

## 2017-01-08 ENCOUNTER — Telehealth: Payer: Self-pay | Admitting: Family Medicine

## 2017-01-08 LAB — URINE CYTOLOGY ANCILLARY ONLY
Bacterial vaginitis: POSITIVE — AB
CANDIDA VAGINITIS: NEGATIVE

## 2017-01-08 MED ORDER — METRONIDAZOLE 500 MG PO TABS: 500.0000 mg | ORAL_TABLET | Freq: Two times a day (BID) | ORAL | 0 refills | Status: DC

## 2017-01-08 MED FILL — metroNIDAZOLE 500 MG TABS: 500 | 7 days supply | Qty: 14 | Fill #0

## 2017-01-08 NOTE — Telephone Encounter (Signed)
Spoke with pt again and she is having symptoms. Will tx with Flagyl 500 mg BID for 7 days. Warned pt not to consume alcohol while on this medicine. Pt voiced understanding and agreement.

## 2017-01-18 ENCOUNTER — Ambulatory Visit: Payer: 59 | Admitting: Obstetrics & Gynecology

## 2017-01-23 ENCOUNTER — Encounter: Payer: Self-pay | Admitting: Family

## 2017-01-23 ENCOUNTER — Ambulatory Visit (INDEPENDENT_AMBULATORY_CARE_PROVIDER_SITE_OTHER): Payer: 59 | Admitting: Family

## 2017-01-23 VITALS — BP 121/70 | HR 52 | Temp 98.2°F | Resp 16 | Ht 62.0 in

## 2017-01-23 DIAGNOSIS — F329 Major depressive disorder, single episode, unspecified: Secondary | ICD-10-CM | POA: Diagnosis not present

## 2017-01-23 DIAGNOSIS — F32A Depression, unspecified: Secondary | ICD-10-CM

## 2017-01-23 DIAGNOSIS — I10 Essential (primary) hypertension: Secondary | ICD-10-CM | POA: Diagnosis not present

## 2017-01-23 MED ORDER — LISINOPRIL 20 MG PO TABS: 20.0000 mg | ORAL_TABLET | Freq: Every day | ORAL | 3 refills | Status: DC

## 2017-01-23 MED ORDER — ESCITALOPRAM OXALATE 10 MG PO TABS
ORAL_TABLET | ORAL | 0 refills | Status: DC
Start: 2017-01-23 — End: 2017-03-15

## 2017-01-23 MED FILL — LISINOPRIL 20 MG TABLET: 20 | 30 days supply | Qty: 30 | Fill #0

## 2017-01-23 MED FILL — ESCITALOPRAM 10 MG TABLET: 10 | 30 days supply | Qty: 30 | Fill #0

## 2017-01-23 NOTE — Progress Notes (Signed)
Subjective:    Patient ID: Danielle Garcia, female    DOB: 11-09-1990, 26 y.o.   MRN: [id number]  HPI  Patient presents today with 2 concerns.  Depression- reports that she does not want to be around people. Everything irritates her.  Drinking 3-4 glasses of wine a night.  Had a friend pass in December. Aunt passed last year.  She is not sleeping at all.  Has used hydoxyzine in the past for sleep. She is taking phentermine but has not taken in 1 week.  Has not been treated for depression/anxiety.  No family hx of anxiety/depression. Had some uncles on the Mom's side with ETOH abuse.  Denies SI/HI.  Sister had a suicide attempt this past weekend.  Denies family hx of bipolar disorder.    Hypertension-reports that she had increased frequency of urination while taking lisinopril hydrochlorothiazide. She denies associated dysuria. She reports that when she stopped her medication for 2 weeks the urinary frequency resolved.Yesterday she reports that she developed a headache. She took 1 tablet by mouth yesterday.  BP Readings from Last 3 Encounters:  01/23/17 121/70  12/15/16 117/78  11/20/16 116/72   Reports BP yesterday was 120/95 off medication.     Review of Systems See HPI  Past Medical History:  Diagnosis Date  . Asthma   . HTN (hypertension)      Social History   Social History  . Marital status: Single    Spouse name: N/A  . Number of children: N/A  . Years of education: N/A   Occupational History  . Not on file.   Social History Main Topics  . Smoking status: Never Smoker  . Smokeless tobacco: Never Used  . Alcohol use No  . Drug use: No  . Sexual activity: Yes    Birth control/ protection: Implant   Other Topics Concern  . Not on file   Social History Narrative  . No narrative on file    Past Surgical History:  Procedure Laterality Date  . CESAREAN SECTION      Family History  Problem Relation Age of Onset  . Hypertension Mother   . Heart attack  Father   . Hypertension Maternal Grandmother   . Diabetes Maternal Grandmother   . Lung cancer Maternal Grandfather   . Diabetes Paternal Grandmother   . Allergies Daughter     No Known Allergies  Current Outpatient Prescriptions on File Prior to Visit  Medication Sig Dispense Refill  . ibuprofen (ADVIL,MOTRIN) 800 MG tablet 800 mg every 8 (eight) hours as needed.     Marland Kitchen ketoconazole (NIZORAL) 2 % cream Apply 1 application topically daily. 60 g 0  . levocetirizine (XYZAL) 5 MG tablet Take 1 tablet (5 mg total) by mouth every evening. 30 tablet 0  . phentermine 37.5 MG capsule Take 37.5 mg by mouth every morning.    . ranitidine (ZANTAC) 150 MG capsule Take 1 capsule (150 mg total) by mouth 2 (two) times daily. 60 capsule 0   No current facility-administered medications on file prior to visit.     BP 121/70 (BP Location: Right Arm, Cuff Size: Large)   Pulse (!) 52   Temp 98.2 F (36.8 C) (Oral)   Resp 16   Ht 5\' 2"  (1.575 m)   LMP 01/02/2017 (Exact Date)   SpO2 100%       Objective:   Physical Exam  Constitutional: She is oriented to person, place, and time. She appears well-developed and well-nourished.  Cardiovascular: Normal  rate, regular rhythm and normal heart sounds.   No murmur heard. Pulmonary/Chest: Effort normal and breath sounds normal. No respiratory distress. She has no wheezes.  Musculoskeletal: She exhibits no edema.  Neurological: She is alert and oriented to person, place, and time.  Psychiatric: Her behavior is normal. Judgment and thought content normal.  Slightly flat affect          Assessment & Plan:  HTN- will remove hctz component and place her on plain lisinopril 20mg  once daily.She is advised to check her blood pressure next week and let me know how it looks. She was reminded not to become pregnant while taking an ACE inhibitor. She is maintained on a Nuvaring.   Depression-new. Patient scored 17 on pH Q9.  We did discuss her use of  phentermine could interfere with her sleep. Discussed cutting back on alcohol use. We discussed establishing with a counselor either through [facility] program at work or through with our behavioral medicine. I've advised her to schedule an appointment. Will also  initiate Lexapro 10mg .  I instructed pt to start 1/2 tablet once daily for 1 week and then increase to a full tablet once daily on week two as tolerated.  We discussed common side effects such as nausea, drowsiness and weight gain.  Also discussed rare but serious side effect of suicide ideation.  She is instructed to discontinue medication go directly to ED if this occurs.  Pt verbalizes understanding.  Plan follow up in 1 month to evaluate progress.

## 2017-01-23 NOTE — Patient Instructions (Addendum)
Please begin lexapro 10mg  1/2 tab once weekly for 1 month, then increase to a full tablet once daily on week two. Stop lisinopril hctz, start lisinopril 20mg  once daily. Schedule an appointment with a counselor- either through [facility] or [place].  Please call 911 in the event that you develop thoughts of hurting yourself or others.

## 2017-01-25 ENCOUNTER — Encounter: Payer: Self-pay | Admitting: Family Medicine

## 2017-01-25 ENCOUNTER — Ambulatory Visit (INDEPENDENT_AMBULATORY_CARE_PROVIDER_SITE_OTHER): Payer: 59 | Admitting: Family Medicine

## 2017-01-25 DIAGNOSIS — M545 Low back pain, unspecified: Secondary | ICD-10-CM

## 2017-01-25 MED ORDER — NAPROXEN 500 MG PO TBEC: 500.0000 mg | DELAYED_RELEASE_TABLET | Freq: Two times a day (BID) | ORAL | 0 refills | Status: DC

## 2017-01-25 MED ORDER — KETOROLAC TROMETHAMINE 60 MG/2ML IM SOLN
60.0000 mg | Freq: Once | INTRAMUSCULAR | Status: AC
  Administered 2017-01-25: 60 mg via INTRAMUSCULAR

## 2017-01-25 MED ORDER — HYDROCODONE-ACETAMINOPHEN 5-325 MG PO TABS: 1.0000 | ORAL_TABLET | Freq: Four times a day (QID) | ORAL | 0 refills | Status: DC | PRN

## 2017-01-25 MED ORDER — CYCLOBENZAPRINE HCL 10 MG PO TABS
10.0000 mg | ORAL_TABLET | Freq: Three times a day (TID) | ORAL | 0 refills | Status: DC | PRN
Start: 2017-01-25 — End: 2017-03-15

## 2017-01-25 MED FILL — NAPROXEN 500 MG TABLET: 500 | 15 days supply | Qty: 30 | Fill #0

## 2017-01-25 MED FILL — CYCLOBENZAPRINE 10 MG TABLE: 10 | 7 days supply | Qty: 20 | Fill #0

## 2017-01-25 MED FILL — HYDROCODON-APAP 5-325: 5-325 | 3 days supply | Qty: 10 | Fill #0

## 2017-01-25 NOTE — Patient Instructions (Signed)
Ice/cold pack over area for 10-15 min every 2-3 hours while awake.  Heat (pad or rice pillow in microwave) over affected area, 10-15 minutes every 2-3 hours while awake.   Take muscle relaxant (Flexeril/cyclobenzaprine) 2-3 hours before planned bedtime. If it makes you drowsy, don't take during the day.  Let us know if you need anything.

## 2017-01-25 NOTE — Progress Notes (Signed)
Pre visit review using our clinic review tool, if applicable. No additional management support is needed unless otherwise documented below in the visit note. 
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
## 2017-01-25 NOTE — Addendum Note (Signed)
Addended by: Sharon Seller B on: 01/25/2017 02:36 PM   Modules accepted: Orders

## 2017-01-26 ENCOUNTER — Ambulatory Visit: Payer: 59 | Admitting: Family Medicine

## 2017-01-29 MED FILL — IBUPROFEN 800 MG TABLET: 800 | 10 days supply | Qty: 30 | Fill #1

## 2017-02-05 ENCOUNTER — Other Ambulatory Visit: Payer: Self-pay

## 2017-02-05 ENCOUNTER — Other Ambulatory Visit: Payer: Self-pay | Admitting: Family Medicine

## 2017-02-05 DIAGNOSIS — Z308 Encounter for other contraceptive management: Secondary | ICD-10-CM

## 2017-02-05 LAB — POCT URINE PREGNANCY: Preg Test, Ur: NEGATIVE

## 2017-02-09 ENCOUNTER — Ambulatory Visit (INDEPENDENT_AMBULATORY_CARE_PROVIDER_SITE_OTHER): Payer: 59 | Admitting: Family Medicine

## 2017-02-09 DIAGNOSIS — L910 Hypertrophic scar: Secondary | ICD-10-CM | POA: Diagnosis not present

## 2017-02-09 MED ORDER — METHYLPREDNISOLONE ACETATE 40 MG/ML IJ SUSP
20.0000 mg | Freq: Once | INTRAMUSCULAR | Status: AC
  Administered 2017-02-09: 20 mg

## 2017-02-09 NOTE — Addendum Note (Signed)
Addended by: Harl Bowie on: 02/09/2017 04:43 PM   Modules accepted: Orders

## 2017-02-09 NOTE — Progress Notes (Signed)
Pt here for f/u Keloid tx. She received steroid injection on 6/21 and reports improvement in size. She would like it again today. No issues with injection in the past.   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  Procedure: Verbal consent obtained. 50:50 mixture of Depomedrol 20 mg and 0.5 cc of 1% lidocaine without epinephrine used. Around 0.25 cc injected into keloid. A bandage was placed. The pt tolerated the procedure well. There were no complications noted.   Keloid  Injection well tolerated. F/u in 4 weeks to inject. Pt voiced understanding and agreement to the plan.  [place], DO 02/09/17 10:01 AM

## 2017-02-14 ENCOUNTER — Other Ambulatory Visit: Payer: Self-pay | Admitting: *Deleted

## 2017-02-14 ENCOUNTER — Ambulatory Visit (INDEPENDENT_AMBULATORY_CARE_PROVIDER_SITE_OTHER): Payer: 59

## 2017-02-14 DIAGNOSIS — Z3040 Encounter for surveillance of contraceptives, unspecified: Secondary | ICD-10-CM | POA: Diagnosis not present

## 2017-02-14 DIAGNOSIS — E669 Obesity, unspecified: Secondary | ICD-10-CM

## 2017-02-14 MED ORDER — MEDROXYPROGESTERONE ACETATE 150 MG/ML IM SUSP
150.0000 mg | Freq: Once | INTRAMUSCULAR | Status: AC
  Administered 2017-02-14: 150 mg via INTRAMUSCULAR

## 2017-02-14 NOTE — Progress Notes (Signed)
Pre visit review using our clinic tool,if applicable. No additional management support is needed unless otherwise documented below in the visit note.   Patient in for Depo-Provera injection and pregnancy testing per order from Dr. Deloris Ping.  Pregnancy test given results Negatve. Provera injection 150 mg IM right hip.  Patient tolerated well.

## 2017-03-12 ENCOUNTER — Ambulatory Visit: Payer: 59 | Admitting: Family Medicine

## 2017-03-14 ENCOUNTER — Other Ambulatory Visit (INDEPENDENT_AMBULATORY_CARE_PROVIDER_SITE_OTHER): Payer: 59

## 2017-03-14 ENCOUNTER — Telehealth: Payer: Self-pay

## 2017-03-14 ENCOUNTER — Ambulatory Visit: Payer: 59 | Admitting: Family Medicine

## 2017-03-14 DIAGNOSIS — Z8639 Personal history of other endocrine, nutritional and metabolic disease: Secondary | ICD-10-CM

## 2017-03-14 LAB — VITAMIN D 25 HYDROXY (VIT D DEFICIENCY, FRACTURES): VITD: 12.33 ng/mL — AB (ref 30.00–100.00)

## 2017-03-14 NOTE — Telephone Encounter (Signed)
Patient report she has been feeling fatigue for a couple days. Per provider, enter a vitamin D lab. LB

## 2017-03-15 ENCOUNTER — Other Ambulatory Visit: Payer: Self-pay | Admitting: Family

## 2017-03-15 ENCOUNTER — Other Ambulatory Visit: Payer: Self-pay | Admitting: Family Medicine

## 2017-03-15 DIAGNOSIS — M545 Low back pain, unspecified: Secondary | ICD-10-CM

## 2017-03-15 MED ORDER — CYCLOBENZAPRINE HCL 10 MG PO TABS: 10.0000 mg | ORAL_TABLET | Freq: Three times a day (TID) | ORAL | 0 refills | Status: DC | PRN

## 2017-03-15 MED ORDER — ESCITALOPRAM OXALATE 10 MG PO TABS: 10.0000 mg | ORAL_TABLET | Freq: Every day | ORAL | 1 refills | Status: DC

## 2017-03-15 MED FILL — ESCITALOPRAM 10 MG TABLET: 10 | 30 days supply | Qty: 30 | Fill #0

## 2017-03-15 MED FILL — CYCLOBENZAPRINE 10 MG TAB: 10 | 6 days supply | Qty: 20 | Fill #0

## 2017-04-03 ENCOUNTER — Telehealth: Payer: Self-pay | Admitting: Family Medicine

## 2017-04-03 ENCOUNTER — Other Ambulatory Visit (INDEPENDENT_AMBULATORY_CARE_PROVIDER_SITE_OTHER): Payer: 59

## 2017-04-03 DIAGNOSIS — N926 Irregular menstruation, unspecified: Secondary | ICD-10-CM

## 2017-04-03 DIAGNOSIS — R110 Nausea: Secondary | ICD-10-CM

## 2017-04-03 LAB — HCG, QUANTITATIVE, PREGNANCY: QUANTITATIVE HCG: 0.07 m[IU]/mL

## 2017-04-03 NOTE — Telephone Encounter (Signed)
Patient states she does not feel well. She states she is very bloated, stomach upset nausea and irregular periods, she wants to rule out pregnancy. Is it ok to order a STAT pregnancy test for patient.   Please advise.

## 2017-04-03 NOTE — Telephone Encounter (Signed)
yes

## 2017-04-10 ENCOUNTER — Encounter (INDEPENDENT_AMBULATORY_CARE_PROVIDER_SITE_OTHER): Payer: Self-pay

## 2017-04-16 ENCOUNTER — Telehealth: Payer: 59 | Admitting: Family

## 2017-04-16 ENCOUNTER — Ambulatory Visit (INDEPENDENT_AMBULATORY_CARE_PROVIDER_SITE_OTHER): Payer: 59 | Admitting: Family Medicine

## 2017-04-16 ENCOUNTER — Encounter: Payer: Self-pay | Admitting: Family Medicine

## 2017-04-16 VITALS — BP 120/78 | HR 83 | Temp 98.9°F | Ht 63.0 in | Wt 220.0 lb

## 2017-04-16 DIAGNOSIS — R0789 Other chest pain: Secondary | ICD-10-CM | POA: Diagnosis not present

## 2017-04-16 DIAGNOSIS — R102 Pelvic and perineal pain: Secondary | ICD-10-CM

## 2017-04-16 MED ORDER — METAXALONE 400 MG PO TABS: 400.0000 mg | ORAL_TABLET | Freq: Three times a day (TID) | ORAL | 0 refills | Status: DC

## 2017-04-16 MED ORDER — MELOXICAM 15 MG PO TABS: 15.0000 mg | ORAL_TABLET | Freq: Every day | ORAL | 0 refills | Status: DC

## 2017-04-16 MED ORDER — ACETAMINOPHEN-CODEINE #3 300-30 MG PO TABS: 1.0000 | ORAL_TABLET | Freq: Three times a day (TID) | ORAL | 0 refills | Status: DC | PRN

## 2017-04-16 MED FILL — METAXALONE 800 MG TABLET: 800 | 10 days supply | Qty: 15 | Fill #0

## 2017-04-16 MED FILL — MELOXICAM 15 MG TABLET: 15 | 30 days supply | Qty: 30 | Fill #0

## 2017-04-16 MED FILL — ACETAMINOPHEN/COD #3 TABLET: 300-30 | 4 days supply | Qty: 12 | Fill #0

## 2017-04-16 NOTE — Progress Notes (Signed)
Pre visit review using our clinic review tool, if applicable. No additional management support is needed unless otherwise documented below in the visit note. 

## 2017-04-16 NOTE — Progress Notes (Signed)
Chief Complaint  Patient presents with  . Back Pain  . Chest Pain    Danielle Garcia is a 26 y.o. female here for evaluation of chest pain.  Duration of issue: 2 days Quality: sharp Palliation: ibuprofen Provocation: unknown Severity: 8.5/10 Radiation: down spine Duration of chest pain: constant Associated symptoms: none Cardiac history: HTN Family heart history: mom w HTN, dad MI Smoker? No  ROS:  Cardiac: No current chest pain Lungs: No SOB  Past Medical History:  Diagnosis Date  . Asthma   . HTN (hypertension)    Family History  Problem Relation Age of Onset  . Hypertension Mother   . Heart attack Father 34  . Hypertension Maternal Grandmother   . Diabetes Maternal Grandmother   . Lung cancer Maternal Grandfather   . Diabetes Paternal Grandmother   . Allergies Daughter    Social History   Social History  . Marital status: Single   Social History Main Topics  . Smoking status: Never Smoker  . Smokeless tobacco: Never Used  . Alcohol use No  . Drug use: No  . Sexual activity: Yes   Allergies as of 04/16/2017   No Known Allergies     Medication List       Accurate as of 04/16/17  9:46 AM. Always use your most recent med list.          acetaminophen-codeine 300-30 MG tablet Commonly known as:  TYLENOL #3 Take 1 tablet by mouth every 8 (eight) hours as needed for moderate pain.   escitalopram 10 MG tablet Commonly known as:  LEXAPRO Take 1 tablet (10 mg total) by mouth daily.   etonogestrel-ethinyl estradiol 0.12-0.015 MG/24HR vaginal ring Commonly known as:  [place] 1 each vaginally every 28 (twenty-eight) days. Insert vaginally and leave in place for 3 consecutive weeks, then remove for 1 week.   levocetirizine 5 MG tablet Commonly known as:  XYZAL Take 1 tablet (5 mg total) by mouth every evening.   lisinopril 20 MG tablet Commonly known as:  PRINIVIL,ZESTRIL Take 1 tablet (20 mg total) by mouth daily.   meloxicam 15 MG  tablet Commonly known as:  MOBIC Take 1 tablet (15 mg total) by mouth daily.   metaxalone 400 MG tablet Commonly known as:  SKELAXIN Take 1 tablet (400 mg total) by mouth 3 (three) times daily.   ranitidine 150 MG capsule Commonly known as:  ZANTAC Take 1 capsule (150 mg total) by mouth 2 (two) times daily.            Discharge Care Instructions        Start     Ordered   04/16/17 0000  meloxicam (MOBIC) 15 MG tablet  Daily     04/16/17 0915   04/16/17 0000  metaxalone (SKELAXIN) 400 MG tablet  3 times daily     04/16/17 0915   04/16/17 0000  acetaminophen-codeine (TYLENOL #3) 300-30 MG tablet  Every 8 hours PRN     04/16/17 0916      BP 120/78 (BP Location: Left Arm, Patient Position: Sitting, Cuff Size: Large)   Pulse 83   Temp 98.9 F (37.2 C) (Oral)   Ht _0  (1.6 m)   Wt 220 lb (99.8 kg)   SpO2 97%   BMI 38.97 kg/m  Gen: awake, alert, appears stated age HEENT: PERRLA, MMM Neck: No masses or asymmetry Heart: RRR, no bruits, no LE edema Lungs: CTAB, no accessory muscle use MSK: chest pain is reproducible to palpation,  TTP over paraspinal musculature in lower cervical and upper thoracics Psych: Age appropriate judgment and insight, nml mood and affect  PROCEDURE NOTE Verbal consent was obtained.  Pre-procedure diagnosis: Somatic dysfunction Post-procedure diagnosis: Same Procedure: OMT  Regions treated include thoracic: MET with the tissue response noted to be slightly improved.  The patient tolerated the procedure well, and there were no complications noted.  The patient was warned of the possibility of increased pain or stiffness of up to 48 hours duration and was asked to call with any unexpected problems.   Chest wall pain - Plan: meloxicam (MOBIC) 15 MG tablet, metaxalone (SKELAXIN) 400 MG tablet, acetaminophen-codeine (TYLENOL #3) 300-30 MG tablet  Orders as above. Heat. Stretch. Tylenol. NSAIDs. T3's for nighttime. EKG normal.  F/u  prn. The patient voiced understanding and agreement to the plan.  [place], DO 04/16/17 9:46 AM

## 2017-04-16 NOTE — Progress Notes (Signed)
Based on what you shared with me it looks like you have a serious condition that should be evaluated in a face to face office visit.  NOTE: Even if you have entered your credit card information for this eVisit, you will not be charged.   If you are having a true medical emergency please call 911.  If you need an urgent face to face visit, [place] has four urgent care centers for your convenience.  If you need care fast and have a high deductible or no insurance consider:   [email or web address]  [phone]  [address], [address] [place], [place] [address] 8 am to 8 pm Monday-Friday 10 am to 4 pm Saturday-Sunday   The following sites will take your  insurance:    . [facility]  [phone] Get Driving Directions Find a Provider at this Location  [address] [place], [place] [address] . 10 am to 8 pm Monday-Friday . 12 pm to 8 pm Saturday-Sunday   . [facility]  [phone] Get Driving Directions Find a Provider at this Location  [address], [address] [place], [place] [address] . 8 am to 8 pm Monday-Friday . 9 am to 6 pm Saturday . 11 am to 6 pm Sunday   . [facility] [place]  [phone] Get Driving Directions  [address] [place], [place] [address] . 8 am to 8 pm Monday-Friday . 8 am to 4 pm Saturday-Sunday   Your e-visit answers were reviewed by a board certified advanced clinical practitioner to complete your personal care plan.  Thank you for using e-Visits.  

## 2017-04-16 NOTE — Patient Instructions (Signed)
Don't fill rx's if too expensive.  Stretch your pecs and upper back.   Practice good posture.  Let us know if you need anything.

## 2017-04-20 ENCOUNTER — Other Ambulatory Visit (INDEPENDENT_AMBULATORY_CARE_PROVIDER_SITE_OTHER): Payer: 59

## 2017-04-20 ENCOUNTER — Other Ambulatory Visit: Payer: Self-pay | Admitting: Family Medicine

## 2017-04-20 ENCOUNTER — Other Ambulatory Visit: Payer: Self-pay

## 2017-04-20 DIAGNOSIS — K629 Disease of anus and rectum, unspecified: Secondary | ICD-10-CM

## 2017-04-20 DIAGNOSIS — N9089 Other specified noninflammatory disorders of vulva and perineum: Secondary | ICD-10-CM

## 2017-04-20 MED ORDER — DIBUCAINE 1 % RE OINT: 1.0000 "application " | TOPICAL_OINTMENT | Freq: Four times a day (QID) | RECTAL | 1 refills | Status: DC | PRN

## 2017-04-20 NOTE — Progress Notes (Signed)
Patient with painful lesion along perineum. Have ordered HSV serology testing and sent in Nupercainal to help with pain qid prn.

## 2017-04-23 ENCOUNTER — Ambulatory Visit (INDEPENDENT_AMBULATORY_CARE_PROVIDER_SITE_OTHER): Payer: 59 | Admitting: Family Medicine

## 2017-04-23 ENCOUNTER — Telehealth: Payer: Self-pay | Admitting: *Deleted

## 2017-04-23 ENCOUNTER — Encounter: Payer: Self-pay | Admitting: Family Medicine

## 2017-04-23 VITALS — BP 120/70 | HR 80 | Temp 98.1°F | Ht 63.0 in | Wt 214.0 lb

## 2017-04-23 DIAGNOSIS — F419 Anxiety disorder, unspecified: Secondary | ICD-10-CM | POA: Diagnosis not present

## 2017-04-23 DIAGNOSIS — F329 Major depressive disorder, single episode, unspecified: Secondary | ICD-10-CM | POA: Diagnosis not present

## 2017-04-23 DIAGNOSIS — F32A Depression, unspecified: Secondary | ICD-10-CM

## 2017-04-23 MED ORDER — IBUPROFEN 800 MG PO TABS: 800.0000 mg | ORAL_TABLET | Freq: Three times a day (TID) | ORAL | 2 refills | Status: DC | PRN

## 2017-04-23 MED ORDER — ESCITALOPRAM OXALATE 10 MG PO TABS: 15.0000 mg | ORAL_TABLET | Freq: Every day | ORAL | 1 refills | Status: DC

## 2017-04-23 MED FILL — IBUPROFEN 800 MG TABS: 800 | 10 days supply | Qty: 30 | Fill #0

## 2017-04-23 MED FILL — ESCITALOPRAM 10 MG TABLET: 10 | 90 days supply | Qty: 135 | Fill #0

## 2017-04-23 NOTE — Telephone Encounter (Signed)
Received results from [facility]; forwarded to provider/SLS 10/08

## 2017-04-23 NOTE — Progress Notes (Signed)
Chief Complaint  Patient presents with  . Back Pain    Is better    Subjective: Patient is a 26 y.o. female here for sleep issues.  I treated her for low back pain and give her Tylenol 3 to use on an as-needed basis for breakthrough pain. It helped the pain, however it helped her sleep more than anything. She requesting some help for sleep. She has a history of anxiety with depression treated with Lexapro. It is helpful and she is tolerating medicine well. She is largely compliant. She has racing thoughts. She normally goes to bed around 9:30 PM and will wake up around 5:45 AM. She has difficulty staying asleep. She does look at her phone before bed. She does not put it on nighttime mode. She does nap throughout the day when able. She is not following with a counselor or psychologist. She was given information for cognitive behavioral therapy.   ROS: Psych: As noted in HPI Const: No unexpected weight loss  Family History  Problem Relation Age of Onset  . Hypertension Mother   . Heart attack Father 68  . Hypertension Maternal Grandmother   . Diabetes Maternal Grandmother   . Lung cancer Maternal Grandfather   . Diabetes Paternal Grandmother   . Allergies Daughter    Past Medical History:  Diagnosis Date  . Asthma   . HTN (hypertension)    No Known Allergies  Current Outpatient Prescriptions:  .  acetaminophen-codeine (TYLENOL #3) 300-30 MG tablet, Take 1 tablet by mouth every 8 (eight) hours as needed for moderate pain., Disp: 12 tablet, Rfl: 0 .  dibucaine (NUPERCAINAL) 1 % OINT, Place 1 application rectally 4 (four) times daily as needed for hemorrhoids., Disp: 56.7 g, Rfl: 1 .  etonogestrel-ethinyl estradiol (NUVARING) 0.12-0.015 MG/24HR vaginal ring, Place 1 each vaginally every 28 (twenty-eight) days. Insert vaginally and leave in place for 3 consecutive weeks, then remove for 1 week., Disp: , Rfl:  .  levocetirizine (XYZAL) 5 MG tablet, Take 1 tablet (5 mg total) by mouth  every evening., Disp: 30 tablet, Rfl: 0 .  metaxalone (SKELAXIN) 400 MG tablet, Take 1 tablet (400 mg total) by mouth 3 (three) times daily., Disp: 30 tablet, Rfl: 0 .  ranitidine (ZANTAC) 150 MG capsule, Take 1 capsule (150 mg total) by mouth 2 (two) times daily., Disp: 60 capsule, Rfl: 0 .  escitalopram (LEXAPRO) 10 MG tablet, Take 1.5 tablets (15 mg total) by mouth daily., Disp: 135 tablet, Rfl: 1 .  ibuprofen (ADVIL,MOTRIN) 800 MG tablet, Take 1 tablet (800 mg total) by mouth every 8 (eight) hours as needed., Disp: 30 tablet, Rfl: 2  Objective: BP 120/70 (BP Location: Left Arm, Patient Position: Sitting, Cuff Size: Large)   Pulse 80   Temp 98.1 F (36.7 C) (Oral)   Ht 5\' 3"  (1.6 m)   Wt 214 lb (97.1 kg)   SpO2 97%   BMI 37.91 kg/m  General: Awake, appears stated age Lungs: No accessory muscle use Psych: Age appropriate judgment and insight, normal affect and mood  Assessment and Plan: Anxiety and depression - Plan: escitalopram (LEXAPRO) 10 MG tablet  Orders as above. Increased dose of Lexapro from 10 mg daily to 50 mg daily. Encouraged her to contact behavioral health for cognitive behavioral therapy. Sleep hygiene issues also addressed. She could improve with this. It does seem that underlying anxiety with depression is the issue. F/u in 1 mo.  The patient voiced understanding and agreement to the plan.  [employer]  Nolene Ebbs, DO 04/23/17  12:07 PM

## 2017-04-23 NOTE — Progress Notes (Signed)
Pre visit review using our clinic review tool, if applicable. No additional management support is needed unless otherwise documented below in the visit note. 

## 2017-04-23 NOTE — Patient Instructions (Signed)
Sleep Hygiene Tips:  Do not watch TV or look at screens within 1 hour of going to bed. If you do, make sure there is a blue light filter (nighttime mode) involved.  Try to go to bed around the same time every night. Wake up at the same time within 1 hour of regular time. Ex: If you wake up at 7 AM for work, do not sleep past 8 AM on days that you don't work.  Do not drink alcohol before bedtime.  Do not consume caffeine-containing beverages after noon or within 9 hours of intended bedtime.  Get regular exercise/physical activity in your life, but not within 2 hours of planned bedtime.  Do not take naps.   Do not eat within 2 hours of planned bedtime.  Melatonin, 3-5 mg 30-60 minutes before planned bedtime may be helpful.   The bed should be for sleep or sex only. If after 20-30 minutes you are unable to fall asleep, get up and do something relaxing. Do this until you feel ready to go to sleep again.   Aim to do some physical exertion for 150 minutes per week. This is typically divided into 5 days per week, 30 minutes per day. The activity should be enough to get your heart rate up. Anything is better than nothing if you have time constraints.

## 2017-04-24 ENCOUNTER — Ambulatory Visit (INDEPENDENT_AMBULATORY_CARE_PROVIDER_SITE_OTHER): Payer: 59 | Admitting: Advanced Practice Midwife

## 2017-04-24 ENCOUNTER — Encounter: Payer: Self-pay | Admitting: Advanced Practice Midwife

## 2017-04-24 VITALS — BP 130/88 | HR 75 | Ht 63.0 in | Wt 225.0 lb

## 2017-04-24 DIAGNOSIS — N765 Ulceration of vagina: Secondary | ICD-10-CM | POA: Diagnosis not present

## 2017-04-24 DIAGNOSIS — S3141XD Laceration without foreign body of vagina and vulva, subsequent encounter: Secondary | ICD-10-CM

## 2017-04-24 LAB — HSV TYPE I/II IGG, IGMW/ REFLEX: HSV 2 IgM: <1:10 {titer}

## 2017-04-24 MED ORDER — CEPHALEXIN 500 MG PO CAPS: 500.0000 mg | ORAL_CAPSULE | Freq: Four times a day (QID) | ORAL | 0 refills | Status: DC

## 2017-04-24 MED ORDER — FLUCONAZOLE 150 MG PO TABS: 150.0000 mg | ORAL_TABLET | ORAL | 3 refills | Status: DC

## 2017-04-24 MED ORDER — VALACYCLOVIR HCL 1 G PO TABS: 1000.0000 mg | ORAL_TABLET | Freq: Two times a day (BID) | ORAL | 3 refills | Status: DC

## 2017-04-24 MED FILL — valACYclovir HCL 1 GM TABS: 1 | 10 days supply | Qty: 20 | Fill #0

## 2017-04-24 MED FILL — CEPHALEXIN 500 MG CAPSULE: 500 | 7 days supply | Qty: 28 | Fill #0

## 2017-04-24 NOTE — Progress Notes (Signed)
Patient ID: Danielle Garcia, female   DOB: 09-28-1990, 26 y.o.   MRN: [id number]  Vaginal laceration/ulceration  HPI Danielle Garcia is a 26 y.o. female.  Presents with c/o pain at introitus.  Had what she thought was a laceration with intercourse which has been hurting more.  Looked at it and saw ulceration extending toward anus.  Her office doctor did HSV serum testing.  Has been using topical lidocaine for pain.    Vaginal Injury  The patient's primary symptoms include genital lesions and vaginal discharge. The patient's pertinent negatives include no genital itching, genital odor, pelvic pain or vaginal bleeding. This is a new problem. The current episode started in the past 7 days. The problem occurs constantly. The problem has been gradually worsening. The pain is severe. The problem affects both sides. She is not pregnant. Pertinent negatives include no back pain, chills, fever, nausea or vomiting. The vaginal discharge was white and thick. There has been no bleeding. She has not been passing clots. She has not been passing tissue. The symptoms are aggravated by tactile pressure and activity. She has tried warm baths (Lidocaine) for the symptoms. The treatment provided mild relief. She is sexually active. No, her partner does not have an STD.   Indication: erythema, discharge, swelling and vesicle of the vulva Symptoms:   more painful than pruritic  Location:  perineum  Past Medical History:  Diagnosis Date  . Asthma   . HTN (hypertension)     Past Surgical History:  Procedure Laterality Date  . CESAREAN SECTION      Family History  Problem Relation Age of Onset  . Hypertension Mother   . Heart attack Father 59  . Hypertension Maternal Grandmother   . Diabetes Maternal Grandmother   . Lung cancer Maternal Grandfather   . Diabetes Paternal Grandmother   . Allergies Daughter     Social History Social History  Substance Use Topics  . Smoking status: Never Smoker  .  Smokeless tobacco: Never Used  . Alcohol use No    No Known Allergies  Current Outpatient Prescriptions  Medication Sig Dispense Refill  . acetaminophen-codeine (TYLENOL #3) 300-30 MG tablet Take 1 tablet by mouth every 8 (eight) hours as needed for moderate pain. 12 tablet 0  . cyclobenzaprine (FLEXERIL) 10 MG tablet   0  . escitalopram (LEXAPRO) 10 MG tablet Take 1.5 tablets (15 mg total) by mouth daily. 135 tablet 1  . ibuprofen (ADVIL,MOTRIN) 800 MG tablet Take 1 tablet (800 mg total) by mouth every 8 (eight) hours as needed. 30 tablet 2  . levocetirizine (XYZAL) 5 MG tablet Take 1 tablet (5 mg total) by mouth every evening. 30 tablet 0  . ranitidine (ZANTAC) 150 MG capsule Take 1 capsule (150 mg total) by mouth 2 (two) times daily. 60 capsule 0  . cephALEXin (KEFLEX) 500 MG capsule Take 1 capsule (500 mg total) by mouth 4 (four) times daily. 28 capsule 0  . dibucaine (NUPERCAINAL) 1 % OINT Place 1 application rectally 4 (four) times daily as needed for hemorrhoids. (Patient not taking: Reported on 04/24/2017) 56.7 g 1  . etonogestrel-ethinyl estradiol (NUVARING) 0.12-0.015 MG/24HR vaginal ring Place 1 each vaginally every 28 (twenty-eight) days. Insert vaginally and leave in place for 3 consecutive weeks, then remove for 1 week.    . fluconazole (DIFLUCAN) 150 MG tablet Take 1 tablet (150 mg total) by mouth once a week. Take once and repeat in 1 week if necessary 1 tablet 3  . metaxalone (  SKELAXIN) 400 MG tablet Take 1 tablet (400 mg total) by mouth 3 (three) times daily. 30 tablet 0  . valACYclovir (VALTREX) 1000 MG tablet Take 1 tablet (1,000 mg total) by mouth 2 (two) times daily. Take for ten days. 20 tablet 3   No current facility-administered medications for this visit.     Review of Systems Review of Systems  Constitutional: Negative for chills and fever.  Gastrointestinal: Negative for nausea and vomiting.  Genitourinary: Positive for vaginal discharge. Negative for pelvic  pain.  Musculoskeletal: Negative for back pain.    Blood pressure 130/88, pulse 75, height 5\' 3"  (1.6 m), weight 225 lb (102.1 kg).  Physical Exam Physical Exam  Constitutional: She is oriented to person, place, and time. She appears well-developed and well-nourished. No distress.  HENT:  Head: Normocephalic.  Cardiovascular: Normal rate.   Pulmonary/Chest: Effort normal. No respiratory distress.  Abdominal: Soft. There is no tenderness.  Genitourinary:  Genitourinary Comments: Kissing ulcerations at introitus with small ones extending down toward anus.  Shape is c/w superficial laceration but edges are red and center is ulcerating. ?secondary infection vs herpetic  Neurological: She is alert and oriented to person, place, and time.  Skin: Skin is warm and dry.  Psychiatric: She has a normal mood and affect.    Data Reviewed HSV IgM pending  Assessment   Vaginal laceration with secondary infection vs herpetic ulceration  Specimen appropriately identified and sent to lab and sent for HSV culture    Plan   Rx Valtrex to cover possible HSV    rx Keflex to cover superinfection   Off work 2 days   Keep area clean and dry    HSV culture sent  Follow-up:  PRN weeks      [name] 04/24/2017, 8:30 AM

## 2017-04-24 NOTE — Patient Instructions (Signed)
Care of a Perineal Tear °A perineal tear is a cut (laceration) in the tissue between the opening of the vagina and the anus (perineum). Some women naturally develop a perineal tear during a vaginal birth. This can happen as the baby emerges from the birth canal and the perineum is stretched. Perineal tears are graded based on how deep and long the laceration is. The grading for perineal tears is as follows: °· First degree. This involves a shallow tear at the edge of the vaginal opening that extends slightly into the perineal skin. °· Second degree. This involves tearing described in a first degree perineal tear and also a deeper tear of the vaginal opening and perineal tissues. It may also include tearing of a muscle just under the perineal skin. °· Third degree. This involves tearing described in a first and second degree perineal tear, with the tear extending into the muscle of the anus (anal sphincter). °· Fourth degree. This involves all levels of tear described for first, second, and third degree perineal tear, with the tear extending into the rectum. ° °First degree perineal tears may or may not be stitched closed, depending on their location and appearance. Second, third, and fourth degree perineal tears are stitched closed immediately after the baby’s birth. °What are the risks? °Depending on the type of perineal tear you have, you may be at risk for the following: °· Bleeding. °· Developing a collection of blood in the perineal tear area (hematoma). °· Pain. This may include pain with urination or bowel movements. °· Infection at the site of the tear. °· Fever. °· Trouble controlling your bowels (fecal incontinence). °· Painful sexual intercourse. ° °How to care for a perineal tear °· The first day, put ice on the area of the tear. °? Put ice in a plastic bag. °? Place a towel between your skin and the bag. °? Leave the ice on for 20 minutes, 2-3 times a day. °· Bathe using a warm sitz bath as directed by  your health care provider. This can speed up healing. Sitz baths can be performed in your bathtub or using a sitz bath kit that fits over your toilet. °? Place 3-4 in. (7.6-10 cm) of warm water in your bathtub or fill the sitz bath over-the-toilet container with warm water. Make sure the water is not too hot by placing a drop on your wrist. °? Sit in the warm water for 20-30 minutes. °? After bathing, pat your perineum dry with a clean towel. Do not scrub the perineum as this could cause pain, irritation, or open any stitches you may have. °? Keep the over-the-toilet sitz bath container clean by rinsing it thoroughly after each use. Ask for help in keeping the bathtub clean with diluted bleach and water (2 Tbsp [30 mL] of bleach to ½ gal [1.9 L] of water). °? Repeat the sitz bath as often as you would like to relieve perineal pain, itching, or discomfort. °· Apply a numbing spray to the perineal tear site as directed by your health care provider. This may help with discomfort. °· Wash your hands before and after applying medicine to the area. °· Put about 3 witch hazel-containing hemorrhoid treatment pads on top of your sanitary pad. The witch hazel in the hemorrhoid pads helps with discomfort and swelling. °· Get a squeeze bottle to squeeze warm water on your perineum when urinating, spraying the area from front to back. Pat the area to dry it. °· Sitting on an inflatable   ring or pillow may provide comfort.  Take medicines only as directed by your health care provider.  Do not have sexual intercourse or use tampons until your health care provider says it is okay. Typically, you must wait at least 6 weeks.  Keep all postpartum appointments as directed by your health care provider. Contact a health care provider if:  Your pain is not relieved with medicines.  You have painful urination.  You have a fever. Get help right away if:  You have redness, swelling, or increasing pain in the area of the  tear.  You have pus coming from the area of the tear.  You notice a bad smell coming from the area of the tear.  Your tear opens.  You notice swelling in the area of the tear that is larger than when you left the hospital.  You cannot urinate. This information is not intended to replace advice given to you by your health care provider. Make sure you discuss any questions you have with your health care provider. Document Released: 11/17/2013 Document Revised: 12/15/2015 Document Reviewed: 04/08/2013 Elsevier Interactive Patient Education  2017 [employer].

## 2017-04-24 NOTE — Progress Notes (Signed)
Patient complaining of a vaginal tear that has been there for approximately a week. Patient states that the area is swollen. Kathrene Alu RNBSN

## 2017-04-27 LAB — HERPES SIMPLEX VIRUS CULTURE

## 2017-04-30 ENCOUNTER — Other Ambulatory Visit: Payer: Self-pay

## 2017-04-30 MED ORDER — ONDANSETRON 8 MG PO TBDP: 8.0000 mg | ORAL_TABLET | Freq: Three times a day (TID) | ORAL | 0 refills | Status: DC | PRN

## 2017-04-30 MED FILL — ONDANSETRON ODT 8 MG TABLET: 8 | 6 days supply | Qty: 20 | Fill #0

## 2017-05-01 ENCOUNTER — Encounter: Payer: Self-pay | Admitting: Advanced Practice Midwife

## 2017-05-01 ENCOUNTER — Other Ambulatory Visit: Payer: Self-pay | Admitting: Advanced Practice Midwife

## 2017-05-01 DIAGNOSIS — B009 Herpesviral infection, unspecified: Secondary | ICD-10-CM | POA: Insufficient documentation

## 2017-05-01 MED ORDER — VALACYCLOVIR HCL 1 G PO TABS: 1000.0000 mg | ORAL_TABLET | Freq: Every day | ORAL | 5 refills | Status: DC

## 2017-05-01 NOTE — Progress Notes (Signed)
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

## 2017-05-02 ENCOUNTER — Other Ambulatory Visit: Payer: Self-pay | Admitting: Family Medicine

## 2017-05-02 DIAGNOSIS — R0789 Other chest pain: Secondary | ICD-10-CM

## 2017-05-03 ENCOUNTER — Ambulatory Visit (INDEPENDENT_AMBULATORY_CARE_PROVIDER_SITE_OTHER): Payer: 59 | Admitting: Family Medicine

## 2017-05-03 ENCOUNTER — Other Ambulatory Visit: Payer: Self-pay | Admitting: Family Medicine

## 2017-05-03 VITALS — BP 126/63 | HR 73 | Temp 98.4°F | Ht 63.0 in | Wt 217.0 lb

## 2017-05-03 DIAGNOSIS — Z0000 Encounter for general adult medical examination without abnormal findings: Secondary | ICD-10-CM

## 2017-05-03 DIAGNOSIS — G44209 Tension-type headache, unspecified, not intractable: Secondary | ICD-10-CM

## 2017-05-03 LAB — CBC
HEMATOCRIT: 39.6 % (ref 36.0–46.0)
HEMOGLOBIN: 13 g/dL (ref 12.0–15.0)
MCHC: 32.9 g/dL (ref 30.0–36.0)
MCV: 93.7 fl (ref 78.0–100.0)
PLATELETS: 300 10*3/uL (ref 150.0–400.0)
RBC: 4.23 Mil/uL (ref 3.87–5.11)
RDW: 12.5 % (ref 11.5–15.5)
WBC: 5.9 10*3/uL (ref 4.0–10.5)

## 2017-05-03 LAB — COMPREHENSIVE METABOLIC PANEL
ALBUMIN: 3.8 g/dL (ref 3.5–5.2)
ALK PHOS: 39 U/L (ref 39–117)
ALT: 14 U/L (ref 0–35)
AST: 18 U/L (ref 0–37)
BILIRUBIN TOTAL: 0.4 mg/dL (ref 0.2–1.2)
BUN: 15 mg/dL (ref 6–23)
CALCIUM: 9.1 mg/dL (ref 8.4–10.5)
CO2: 28 mEq/L (ref 19–32)
Chloride: 104 mEq/L (ref 96–112)
Creatinine, Ser: 0.68 mg/dL (ref 0.40–1.20)
GFR: 133.31 mL/min (ref >60.00)
GLUCOSE: 93 mg/dL (ref 70–99)
POTASSIUM: 3.8 meq/L (ref 3.5–5.1)
Sodium: 137 mEq/L (ref 135–145)
TOTAL PROTEIN: 6.9 g/dL (ref 6.0–8.3)

## 2017-05-03 LAB — LIPID PANEL
CHOLESTEROL: 134 mg/dL (ref 0–200)
HDL: 46.9 mg/dL (ref >39.00)
LDL Cholesterol: 77 mg/dL (ref 0–99)
NONHDL: 87.46
Total CHOL/HDL Ratio: 3
Triglycerides: 53 mg/dL (ref 0.0–149.0)
VLDL: 10.6 mg/dL (ref 0.0–40.0)

## 2017-05-03 MED ORDER — ACETAMINOPHEN-CODEINE #3 300-30 MG PO TABS: 1.0000 | ORAL_TABLET | Freq: Three times a day (TID) | ORAL | 0 refills | Status: DC | PRN

## 2017-05-03 MED FILL — ACETAMINOPHEN/COD #3 TABLET: 300-30 | 4 days supply | Qty: 12 | Fill #0

## 2017-05-03 MED FILL — valACYclovir HCL 1 GM TABS: 1 | 5 days supply | Qty: 5 | Fill #0

## 2017-05-03 MED FILL — FLUCONAZOLE 150 MG TABLET: 150 | 1 days supply | Qty: 1 | Fill #0

## 2017-05-03 NOTE — Patient Instructions (Signed)
EXERCISES RANGE OF MOTION (ROM) AND STRETCHING EXERCISES  These exercises may help you when beginning to rehabilitate your issue. In order to successfully resolve your symptoms, you must improve your posture. These exercises are designed to help reduce the forward-head and rounded-shoulder posture which contributes to this condition. Your symptoms may resolve with or without further involvement from your physician, physical therapist or athletic trainer. While completing these exercises, remember:   Restoring tissue flexibility helps normal motion to return to the joints. This allows healthier, less painful movement and activity.  An effective stretch should be held for at least 20 seconds, although you may need to begin with shorter hold times for comfort.  A stretch should never be painful. You should only feel a gentle lengthening or release in the stretched tissue.  Do not do any stretch or exercise that you cannot tolerate.  STRETCH- Axial Extensors  Lie on your back on the floor. You may bend your knees for comfort. Place a rolled-up hand towel or dish towel, about 2 inches in diameter, under the part of your head that makes contact with the floor.  Gently tuck your chin, as if trying to make a "double chin," until you feel a gentle stretch at the base of your head.  Hold 15-20 seconds. Repeat 2-3 times. Complete this exercise 1 time per day.   STRETCH - Axial Extension   Stand or sit on a firm surface. Assume a good posture: chest up, shoulders drawn back, abdominal muscles slightly tense, knees unlocked (if standing) and feet hip width apart.  Slowly retract your chin so your head slides back and your chin slightly lowers. Continue to look straight ahead.  You should feel a gentle stretch in the back of your head. Be certain not to feel an aggressive stretch since this can cause headaches later.  Hold for 15-20 seconds. Repeat 2-3 times. Complete this exercise 1 time per  day.  STRETCH - Cervical Side Bend   Stand or sit on a firm surface. Assume a good posture: chest up, shoulders drawn back, abdominal muscles slightly tense, knees unlocked (if standing) and feet hip width apart.  Without letting your nose or shoulders move, slowly tip your right / left ear to your shoulder until your feel a gentle stretch in the muscles on the opposite side of your neck.  Hold 15-20 seconds. Repeat 2-3 times. Complete this exercise 1-2 times per day.  STRETCH - Cervical Rotators   Stand or sit on a firm surface. Assume a good posture: chest up, shoulders drawn back, abdominal muscles slightly tense, knees unlocked (if standing) and feet hip width apart.  Keeping your eyes level with the ground, slowly turn your head until you feel a gentle stretch along the back and opposite side of your neck.  Hold 15-20 seconds. Repeat 2-3 times. Complete this exercise 1-2 times per day.  RANGE OF MOTION - Neck Circles   Stand or sit on a firm surface. Assume a good posture: chest up, shoulders drawn back, abdominal muscles slightly tense, knees unlocked (if standing) and feet hip width apart.  Gently roll your head down and around from the back of one shoulder to the back of the other. The motion should never be forced or painful.  Repeat the motion 10-20 times, or until you feel the neck muscles relax and loosen. Repeat 2-3 times. Complete the exercise 1-2 times per day. STRENGTHENING EXERCISES - Cervical Strain and Sprain These exercises may help you when beginning to  rehabilitate your injury. They may resolve your symptoms with or without further involvement from your physician, physical therapist, or athletic trainer. While completing these exercises, remember:   Muscles can gain both the endurance and the strength needed for everyday activities through controlled exercises.  Complete these exercises as instructed by your physician, physical therapist, or athletic trainer.  Progress the resistance and repetitions only as guided.  You may experience muscle soreness or fatigue, but the pain or discomfort you are trying to eliminate should never worsen during these exercises. If this pain does worsen, stop and make certain you are following the directions exactly. If the pain is still present after adjustments, discontinue the exercise until you can discuss the trouble with your clinician.  STRENGTH - Cervical Flexors, Isometric  Face a wall, standing about 6 inches away. Place a small pillow, a ball about 6-8 inches in diameter, or a folded towel between your forehead and the wall.  Slightly tuck your chin and gently push your forehead into the soft object. Push only with mild to moderate intensity, building up tension gradually. Keep your jaw and forehead relaxed.  Hold 10 to 20 seconds. Keep your breathing relaxed.  Release the tension slowly. Relax your neck muscles completely before you start the next repetition. Repeat 2-3 times. Complete this exercise 1 time per day.  STRENGTH- Cervical Lateral Flexors, Isometric   Stand about 6 inches away from a wall. Place a small pillow, a ball about 6-8 inches in diameter, or a folded towel between the side of your head and the wall.  Slightly tuck your chin and gently tilt your head into the soft object. Push only with mild to moderate intensity, building up tension gradually. Keep your jaw and forehead relaxed.  Hold 10 to 20 seconds. Keep your breathing relaxed.  Release the tension slowly. Relax your neck muscles completely before you start the next repetition. Repeat 2-3 times. Complete this exercise 1 time per day.  STRENGTH - Cervical Extensors, Isometric   Stand about 6 inches away from a wall. Place a small pillow, a ball about 6-8 inches in diameter, or a folded towel between the back of your head and the wall.  Slightly tuck your chin and gently tilt your head back into the soft object. Push only with  mild to moderate intensity, building up tension gradually. Keep your jaw and forehead relaxed.  Hold 10 to 20 seconds. Keep your breathing relaxed.  Release the tension slowly. Relax your neck muscles completely before you start the next repetition. Repeat 2-3 times. Complete this exercise 1 time per day.  POSTURE AND BODY MECHANICS CONSIDERATIONS Keeping correct posture when sitting, standing or completing your activities will reduce the stress put on different body tissues, allowing injured tissues a chance to heal and limiting painful experiences. The following are general guidelines for improved posture. Your physician or physical therapist will provide you with any instructions specific to your needs. While reading these guidelines, remember:  The exercises prescribed by your provider will help you have the flexibility and strength to maintain correct postures.  The correct posture provides the optimal environment for your joints to work. All of your joints have less wear and tear when properly supported by a spine with good posture. This means you will experience a healthier, less painful body.  Correct posture must be practiced with all of your activities, especially prolonged sitting and standing. Correct posture is as important when doing repetitive low-stress activities (typing) as it is when  doing a single heavy-load activity (lifting).  PROLONGED STANDING WHILE SLIGHTLY LEANING FORWARD When completing a task that requires you to lean forward while standing in one place for a long time, place either foot up on a stationary 2- to 4-inch high object to help maintain the best posture. When both feet are on the ground, the low back tends to lose its slight inward curve. If this curve flattens (or becomes too large), then the back and your other joints will experience too much stress, fatigue more quickly, and can cause pain.   RESTING POSITIONS Consider which positions are most painful for  you when choosing a resting position. If you have pain with flexion-based activities (sitting, bending, stooping, squatting), choose a position that allows you to rest in a less flexed posture. You would want to avoid curling into a fetal position on your side. If your pain worsens with extension-based activities (prolonged standing, working overhead), avoid resting in an extended position such as sleeping on your stomach. Most people will find more comfort when they rest with their spine in a more neutral position, neither too rounded nor too arched. Lying on a non-sagging bed on your side with a pillow between your knees, or on your back with a pillow under your knees will often provide some relief. Keep in mind, being in any one position for a prolonged period of time, no matter how correct your posture, can still lead to stiffness.  WALKING Walk with an upright posture. Your ears, shoulders, and hips should all line up. OFFICE WORK When working at a desk, create an environment that supports good, upright posture. Without extra support, muscles fatigue and lead to excessive strain on joints and other tissues.  CHAIR:  A chair should be able to slide under your desk when your back makes contact with the back of the chair. This allows you to work closely.  The chair's height should allow your eyes to be level with the upper part of your monitor and your hands to be slightly lower than your elbows.  Body position: ? Your feet should make contact with the floor. If this is not possible, use a foot rest. ? Keep your ears over your shoulders. This will reduce stress on your neck and low back.   Healthy Eating Plan Many factors influence your heart health, including eating and exercise habits. Heart (coronary) risk increases with abnormal blood fat (lipid) levels. Heart-healthy meal planning includes limiting unhealthy fats, increasing healthy fats, and making other small dietary changes. This  includes maintaining a healthy body weight to help keep lipid levels within a normal range.  WHAT IS MY PLAN?  Your health care provider recommends that you:  Drink a glass of water before meals to help with satiety.  Eat slowly.  An alternative to the water is to add Metamucil. This will help with satiety as well. It does contain calories, unlike water.  WHAT TYPES OF FAT SHOULD I CHOOSE?  Choose healthy fats more often. Choose monounsaturated and polyunsaturated fats, such as olive oil and canola oil, flaxseeds, walnuts, almonds, and seeds.  Eat more omega-3 fats. Good choices include salmon, mackerel, sardines, tuna, flaxseed oil, and ground flaxseeds. Aim to eat fish at least two times each week.  Avoid foods with partially hydrogenated oils in them. These contain trans fats. Examples of foods that contain trans fats are stick margarine, some tub margarines, cookies, crackers, and other baked goods. If you are going to avoid a fat, this  is the one to avoid!  WHAT GENERAL GUIDELINES DO I NEED TO FOLLOW?  Check food labels carefully to identify foods with trans fats. Avoid these types of options when possible.  Fill one half of your plate with vegetables and green salads. Eat 4-5 servings of vegetables per day. A serving of vegetables equals 1 cup of raw leafy vegetables,  cup of raw or cooked cut-up vegetables, or  cup of vegetable juice.  Fill one fourth of your plate with whole grains. Look for the word "whole" as the first word in the ingredient list.  Fill one fourth of your plate with lean protein foods.  Eat 4-5 servings of fruit per day. A serving of fruit equals one medium whole fruit,  cup of dried fruit,  cup of fresh, frozen, or canned fruit. Try to avoid fruits in cups/syrups as the sugar content can be high.  Eat more foods that contain soluble fiber. Examples of foods that contain this type of fiber are apples, broccoli, carrots, beans, peas, and barley. Aim to get  20-30 g of fiber per day.  Eat more home-cooked food and less restaurant, buffet, and fast food.  Limit or avoid alcohol.  Limit foods that are high in starch and sugar.  Avoid fried foods when able.  Cook foods by using methods other than frying. Baking, boiling, grilling, and broiling are all great options. Other fat-reducing suggestions include: ? Removing the skin from poultry. ? Removing all visible fats from meats. ? Skimming the fat off of stews, soups, and gravies before serving them. ? Steaming vegetables in water or broth.  Lose weight if you are overweight. Losing just 5-10% of your initial body weight can help your overall health and prevent diseases such as diabetes and heart disease.  Increase your consumption of nuts, legumes, and seeds to 4-5 servings per week. One serving of dried beans or legumes equals  cup after being cooked, one serving of nuts equals 1 ounces, and one serving of seeds equals  ounce or 1 tablespoon.  WHAT ARE GOOD FOODS CAN I EAT? Grains Grainy breads (try to find bread that is 3 g of fiber per slice or greater), oatmeal, light popcorn. Whole-grain cereals. Rice and pasta, including brown rice and those that are made with whole wheat. Edamame pasta is a great alternative to grain pasta. It has a higher protein content. Try to avoid significant consumption of white bread, sugary cereals, or pastries/baked goods.  Vegetables All vegetables. Cooked white potatoes do not count as vegetables.  Fruits All fruits, but limit pineapple and bananas as these fruits have a higher sugar content.  Meats and Other Protein Sources Lean, well-trimmed beef, veal, pork, and lamb. Chicken and [place] without skin. All fish and shellfish. Wild duck, rabbit, pheasant, and venison. Egg whites or low-cholesterol egg substitutes. Dried beans, peas, lentils, and tofu.Seeds and most nuts.  Dairy Low-fat or nonfat cheeses, including ricotta, string, and mozzarella. Skim  or 1% milk that is liquid, powdered, or evaporated. Buttermilk that is made with low-fat milk. Nonfat or low-fat yogurt. Soy/Almond milk are good alternatives if you cannot handle dairy.  Beverages Water is the best for you. Sports drinks with less sugar are more desirable unless you are a highly active athlete.  Sweets and Desserts Sherbets and fruit ices. Honey, jam, marmalade, jelly, and syrups. Dark chocolate.  Eat all sweets and desserts in moderation.  Fats and Oils Nonhydrogenated (trans-free) margarines. Vegetable oils, including soybean, sesame, sunflower, olive, peanut, safflower,  corn, canola, and cottonseed. Salad dressings or mayonnaise that are made with a vegetable oil. Limit added fats and oils that you use for cooking, baking, salads, and as spreads.  Other Cocoa powder. Coffee and tea. Most condiments.  The items listed above may not be a complete list of recommended foods or beverages. Contact your dietitian for more options.

## 2017-05-03 NOTE — Telephone Encounter (Signed)
Pt is requesting refill on Tylenol #3 

## 2017-05-03 NOTE — Progress Notes (Signed)
Chief Complaint  Patient presents with  . Annual Exam     Well Woman Danielle Garcia is here for a complete physical.   Her last physical was >1 year ago.  Current diet: in general, an "unhealthy" diet. Current exercise: none. Weight is stable and she + daytime fatigue. No LMP recorded. Patient has had an injection..  Home safety concerns? No. Seatbelt? Yes  Health Maintenance Pap/HPV- No, >3 years ago Tetanus- Yes   +HA today and neck pain. Tried Advil w little relief. Feels mostly pain when she wakes up, thinks she needs a new pillow. No neurologic s/s's.   Past Medical History:  Diagnosis Date  . Asthma   . HTN (hypertension)     Past Surgical History:  Procedure Laterality Date  . CESAREAN SECTION     Medications  Current Outpatient Prescriptions on File Prior to Visit  Medication Sig Dispense Refill  . acetaminophen-codeine (TYLENOL #3) 300-30 MG tablet Take 1 tablet by mouth every 8 (eight) hours as needed for moderate pain. 12 tablet 0  . cyclobenzaprine (FLEXERIL) 10 MG tablet   0  . dibucaine (NUPERCAINAL) 1 % OINT Place 1 application rectally 4 (four) times daily as needed for hemorrhoids. 56.7 g 1  . escitalopram (LEXAPRO) 10 MG tablet Take 1.5 tablets (15 mg total) by mouth daily. 135 tablet 1  . etonogestrel-ethinyl estradiol (NUVARING) 0.12-0.015 MG/24HR vaginal ring Place 1 each vaginally every 28 (twenty-eight) days. Insert vaginally and leave in place for 3 consecutive weeks, then remove for 1 week.    . fluconazole (DIFLUCAN) 150 MG tablet Take 1 tablet (150 mg total) by mouth once a week. Take once and repeat in 1 week if necessary 1 tablet 3  . ibuprofen (ADVIL,MOTRIN) 800 MG tablet Take 1 tablet (800 mg total) by mouth every 8 (eight) hours as needed. 30 tablet 2  . levocetirizine (XYZAL) 5 MG tablet Take 1 tablet (5 mg total) by mouth every evening. 30 tablet 0  . metaxalone (SKELAXIN) 400 MG tablet Take 1 tablet (400 mg total) by mouth 3 (three) times  daily. 30 tablet 0  . ondansetron (ZOFRAN ODT) 8 MG disintegrating tablet Take 1 tablet (8 mg total) by mouth every 8 (eight) hours as needed for nausea or vomiting. 20 tablet 0  . ranitidine (ZANTAC) 150 MG capsule Take 1 capsule (150 mg total) by mouth 2 (two) times daily. 60 capsule 0  . valACYclovir (VALTREX) 1000 MG tablet Take 1 tablet (1,000 mg total) by mouth 2 (two) times daily. Take for ten days. 20 tablet 3  . valACYclovir (VALTREX) 1000 MG tablet Take 1 tablet (1,000 mg total) by mouth daily. PRN for recurrent HSV outbreaks 5 tablet 5   Allergies No Known Allergies  Review of Systems: Constitutional:  no unexpected change in weight, no weakness, no unexplained fevers, sweats, or chills Eye:  no recent significant change in vision Ear/Nose/Mouth/Throat:  Ears:  no tinnitus or vertigo and no recent change in hearing, Nose/Mouth/Throat:  no complaints of nasal congestion or discharge, no sore throat and no recent change in voice or hoarseness Cardiovascular:  no exercise intolerance, no chest pain, no palpitations Respiratory:  no chronic cough, sputum, or hemoptysis and no shortness of breath Gastrointestinal:  no abdominal pain, no change in bowel habits, no significant change in appetite, no nausea, vomiting, diarrhea, or constipation and no black or bloody stool GU:  Female: negative for dysuria, frequency, and incontinence, Normal menses; no abnormal bleeding, pelvic pain, or discharge Musculoskeletal/Extremities:+back/neck  pain; otherwise no pain, redness, or swelling of the joints Integumentary (Skin/Breast):  no abnormal skin lesions reported, no new breast lumps or masses Neurologic: +current headache, otherwise no chronic headaches, no numbness, tingling, or tremor Psychiatric: +anxiety, no depression Endocrine:  denies fatigue, weight changes, heat/cold intolerance, bowel or skin changes, or cardiovascular system symptoms Hematologic/Lymphatic:  no abnormal  bleeding  Exam BP 126/63 (BP Location: Left Arm, Patient Position: Sitting, Cuff Size: Normal)   Pulse 73   Temp 98.4 F (36.9 C) (Oral)   Ht 5\' 3"  (1.6 m)   Wt 217 lb (98.4 kg)   SpO2 100%   BMI 38.44 kg/m  General:  well developed, well nourished, in no apparent distress Skin:  no significant moles, warts, or growths Head:  no masses, lesions, or tenderness Eyes:  pupils equal and round, sclera anicteric without injection Ears:  canals without lesions, TMs shiny without retraction, no obvious effusion, no erythema Nose:  nares patent, septum midline, mucosa normal, and no drainage or sinus tenderness Throat/Pharynx:  lips and gingiva without lesion; tongue and uvula midline; non-inflamed pharynx; no exudates or postnasal drainage Neck: neck supple without adenopathy, thyromegaly, or masses Breasts: Not done Thorax:  nontender Lungs:  clear to auscultation, breath sounds equal bilaterally, no respiratory distress Cardio:  regular rate and rhythm without murmurs, heart sounds without clicks or rubs, point of maximal impulse normal; no lifts, heaves, or thrills Abdomen:  abdomen soft, nontender; bowel sounds normal; no masses or organomegaly Genital: Defer to GYN Musculoskeletal: +TTP over thoracic paraspinal msc and cervical paraspinal msc b/l; symmetrical muscle groups noted without atrophy or deformity Extremities:  no clubbing, cyanosis, or edema, no deformities, no skin discoloration Neuro:  gait normal; deep tendon reflexes normal and symmetric Psych: well oriented with normal range of affect and appropriate judgment/insight  PROCEDURE NOTE After discussing the procedure and risks, including but not limited to increased pain or stiffness and rarely nausea or dizziness, verbal consent was obtained.  Pre-procedure diagnosis: Somatic dysfunction Post-procedure diagnosis: Same Procedure: OMT  Regions treated include cranial, cervical spine thoracic: Soft tissue and HVLA with  the tissue response noted to be Improved.  The patient tolerated the procedure well, and there were no complications noted.  The patient was warned of the possibility of increased pain or stiffness of up to 48 hours duration and was asked to call with any unexpected problems.   Assessment and Plan  Well adult exam - Plan: CBC, Comprehensive metabolic panel, Lipid panel  Tension headache   Well 26 y.o. female. Counseled on diet and exercise. Healthy diet handout given. For HA, stretches/exercises, heat, NSAIDs. Other orders as above- labs drawn this AM. Follow up as originally scheduled. The patient voiced understanding and agreement to the plan.  [place], DO 05/03/17 12:25 PM;

## 2017-05-03 NOTE — Telephone Encounter (Signed)
OK 

## 2017-05-11 ENCOUNTER — Ambulatory Visit (INDEPENDENT_AMBULATORY_CARE_PROVIDER_SITE_OTHER): Payer: 59 | Admitting: Psychology

## 2017-05-11 ENCOUNTER — Other Ambulatory Visit: Payer: Self-pay | Admitting: Family Medicine

## 2017-05-11 DIAGNOSIS — F4323 Adjustment disorder with mixed anxiety and depressed mood: Secondary | ICD-10-CM

## 2017-05-11 MED ORDER — GI COCKTAIL ~~LOC~~: 30.0000 mL | Freq: Two times a day (BID) | ORAL | 0 refills | Status: DC

## 2017-05-11 MED FILL — GERI-LANTA LIQUID: 200-200-20 | 10 days supply | Qty: 355 | Fill #0

## 2017-05-11 NOTE — Progress Notes (Unsigned)
Patient called and stated at home she was having upset stomach, abdominal pain, heartburn. States she wants GI cocktail sent to pharmacy downstairs

## 2017-05-11 NOTE — Progress Notes (Signed)
Will try a small amount of GI cocktail if no relief needs to be seen

## 2017-05-14 ENCOUNTER — Other Ambulatory Visit (INDEPENDENT_AMBULATORY_CARE_PROVIDER_SITE_OTHER): Payer: 59

## 2017-05-14 ENCOUNTER — Other Ambulatory Visit: Payer: Self-pay | Admitting: Family Medicine

## 2017-05-14 DIAGNOSIS — R5383 Other fatigue: Secondary | ICD-10-CM

## 2017-05-14 DIAGNOSIS — R42 Dizziness and giddiness: Secondary | ICD-10-CM

## 2017-05-14 DIAGNOSIS — J00 Acute nasopharyngitis [common cold]: Secondary | ICD-10-CM | POA: Diagnosis not present

## 2017-05-14 DIAGNOSIS — IMO0001 Reserved for inherently not codable concepts without codable children: Secondary | ICD-10-CM

## 2017-05-14 LAB — T4, FREE: FREE T4: 0.71 ng/dL (ref 0.60–1.60)

## 2017-05-14 LAB — TSH: TSH: 0.47 u[IU]/mL (ref 0.35–4.50)

## 2017-05-16 ENCOUNTER — Ambulatory Visit: Payer: 59

## 2017-05-17 ENCOUNTER — Ambulatory Visit: Payer: 59

## 2017-05-23 ENCOUNTER — Ambulatory Visit (INDEPENDENT_AMBULATORY_CARE_PROVIDER_SITE_OTHER): Payer: 59 | Admitting: Family Medicine

## 2017-05-23 ENCOUNTER — Encounter: Payer: Self-pay | Admitting: Family Medicine

## 2017-05-23 VITALS — BP 122/82 | HR 78 | Temp 98.6°F | Ht 63.0 in | Wt 225.0 lb

## 2017-05-23 DIAGNOSIS — L0292 Furuncle, unspecified: Secondary | ICD-10-CM

## 2017-05-23 MED ORDER — DOXYCYCLINE HYCLATE 100 MG PO TABS: 100.0000 mg | ORAL_TABLET | Freq: Two times a day (BID) | ORAL | 0 refills | Status: DC

## 2017-05-23 MED FILL — DOXYCYCLINE HYCLATE 100 MG: 100 | 10 days supply | Qty: 20 | Fill #0

## 2017-05-23 NOTE — Progress Notes (Signed)
CC: Skin lesion on face  Niamh Rada is a 26 y.o. female here for a skin complaint.  Duration: 3 days Location: chin Pruritic? No Painful? Yes Drainage? No New soaps/lotions/topicals/detergents? No Sick contacts? No Other associated symptoms: swelling, some redness Therapies tried thus far: TAO She has been picking at it  ROS:  Const: No fevers Skin: As noted in HPI  Past Medical History:  Diagnosis Date  . Asthma   . HTN (hypertension)    No Known Allergies Allergies as of 05/23/2017   No Known Allergies     Medication List        Accurate as of 05/23/17  1:53 PM. Always use your most recent med list.          doxycycline 100 MG tablet Commonly known as:  VIBRA-TABS Take 1 tablet (100 mg total) 2 (two) times daily by mouth.   escitalopram 10 MG tablet Commonly known as:  LEXAPRO Take 1.5 tablets (15 mg total) by mouth daily.   ranitidine 150 MG capsule Commonly known as:  ZANTAC Take 1 capsule (150 mg total) by mouth 2 (two) times daily.   valACYclovir 1000 MG tablet Commonly known as:  VALTREX Take 1 tablet (1,000 mg total) by mouth daily. PRN for recurrent HSV outbreaks       BP 122/82 (BP Location: Left Arm, Patient Position: Sitting, Cuff Size: Large)   Pulse 78   Temp 98.6 F (37 C) (Oral)   Ht 5\' 3"  (1.6 m)   Wt 225 lb (102.1 kg)   SpO2 98%   BMI 39.86 kg/m  Gen: awake, alert, appearing stated age Lungs: No accessory muscle use Skin: see below.  +mild warmth and TTPNo drainage, fluctuance Psych: Age appropriate judgment and insight  Media Information     Furuncle  Tx w PO abx, doxy 100 mg BID. Home care and things to look out for verbalized and written down. F/u prn. The patient voiced understanding and agreement to the plan.  [place], DO 05/23/17 1:53 PM

## 2017-05-23 NOTE — Patient Instructions (Signed)
Things to look out for: drainage (pus), increasing pain, spreading redness, fevers  Ice/cold pack over area for 10-15 min every 2-3 hours while awake.  Ibuprofen 400-600 mg (2-3 over the counter strength tabs) every 6 hours as needed for pain.  OK to take Tylenol 1000 mg (2 extra strength tabs) or 975 mg (3 regular strength tabs) every 6 hours as needed.

## 2017-06-05 ENCOUNTER — Ambulatory Visit (INDEPENDENT_AMBULATORY_CARE_PROVIDER_SITE_OTHER): Payer: 59 | Admitting: Psychology

## 2017-06-05 DIAGNOSIS — F4323 Adjustment disorder with mixed anxiety and depressed mood: Secondary | ICD-10-CM

## 2017-06-06 ENCOUNTER — Ambulatory Visit (INDEPENDENT_AMBULATORY_CARE_PROVIDER_SITE_OTHER): Payer: 59 | Admitting: Family Medicine

## 2017-06-06 ENCOUNTER — Encounter: Payer: Self-pay | Admitting: Family Medicine

## 2017-06-06 VITALS — BP 118/76 | HR 68 | Temp 98.5°F | Ht 63.0 in | Wt 225.0 lb

## 2017-06-06 DIAGNOSIS — B070 Plantar wart: Secondary | ICD-10-CM

## 2017-06-06 DIAGNOSIS — L74513 Primary focal hyperhidrosis, soles: Secondary | ICD-10-CM

## 2017-06-06 MED ORDER — CLOTRIMAZOLE 1 % EX CREA: 1.0000 "application " | TOPICAL_CREAM | Freq: Two times a day (BID) | CUTANEOUS | 0 refills | Status: DC

## 2017-06-06 MED FILL — CLOTRIMAZOLE 1% CREAM: 1 | 20 days supply | Qty: 57 | Fill #0

## 2017-06-06 NOTE — Patient Instructions (Signed)
Use Compound W or topical wart acid on toe. Try to get dead skin off top of area before applying. You may need another treatment.  You can use Drysol over the counter for sweating (Aluminum hydroxide) on your feet.  Let us know if you need anything.

## 2017-06-06 NOTE — Progress Notes (Signed)
Chief Complaint  Patient presents with  . Toe Pain    Danielle Garcia is a 26 y.o. female here for a skin complaint.  Duration: several months Location: R 5th toe Pruritic? No Painful? Yes Drainage? No Other associated symptoms: callus buildup Therapies tried thus far: none  She has also been having sweating of feet and is wondering if anything can be done.   ROS:  Const: No fevers Skin: As noted in HPI  Past Medical History:  Diagnosis Date  . Asthma   . HTN (hypertension)    No Known Allergies Allergies as of 06/06/2017   No Known Allergies     Medication List        Accurate as of 06/06/17 12:53 PM. Always use your most recent med list.          clotrimazole 1 % cream Commonly known as:  LOTRIMIN Apply 1 application topically 2 (two) times daily. Use over areas on foot twice daily for 6 weeks.   doxycycline 100 MG tablet Commonly known as:  VIBRA-TABS Take 1 tablet (100 mg total) 2 (two) times daily by mouth.   escitalopram 10 MG tablet Commonly known as:  LEXAPRO Take 1.5 tablets (15 mg total) by mouth daily.   ranitidine 150 MG capsule Commonly known as:  ZANTAC Take 1 capsule (150 mg total) by mouth 2 (two) times daily.   valACYclovir 1000 MG tablet Commonly known as:  VALTREX Take 1 tablet (1,000 mg total) by mouth daily. PRN for recurrent HSV outbreaks       BP 118/76 (BP Location: Left Arm, Patient Position: Sitting, Cuff Size: Large)   Pulse 68   Temp 98.5 F (36.9 C) (Oral)   Ht 5\' 3"  (1.6 m)   Wt 225 lb (102.1 kg)   SpO2 97%   BMI 39.86 kg/m  Gen: awake, alert, appearing stated age Lungs: No accessory muscle use Skin: Medial surface of 5th digit on R, there is a thickened area with a central umbilication. +TTP. No drainage, erythema, fluctuance, excoriation Psych: Age appropriate judgment and insight  Procedure note, cryotherapy Verbal consent obtained. The area of interest was pared down using a 15 blade scalpel. Liquid  nitrogen was used until blanching occurred. The patient tolerated the procedure well.   There were no immediate complications noted.  Plantar wart - Plan: PR DESTRUC BENIGN/PREMAL,FIRST LESION  Hyperhidrosis of feet  Orders as above. Compound W vs salicylic acid, donut pads, filing/paring down.  Drysol.  F/u prn. The patient voiced understanding and agreement to the plan.  [place], DO 06/06/17 12:53 PM

## 2017-06-06 NOTE — Progress Notes (Signed)
Pre visit review using our clinic review tool, if applicable. No additional management support is needed unless otherwise documented below in the visit note. 

## 2017-06-11 ENCOUNTER — Other Ambulatory Visit (INDEPENDENT_AMBULATORY_CARE_PROVIDER_SITE_OTHER): Payer: 59

## 2017-06-11 ENCOUNTER — Other Ambulatory Visit: Payer: Self-pay | Admitting: Family Medicine

## 2017-06-11 ENCOUNTER — Telehealth: Payer: Self-pay

## 2017-06-11 DIAGNOSIS — R110 Nausea: Secondary | ICD-10-CM

## 2017-06-11 DIAGNOSIS — R5383 Other fatigue: Secondary | ICD-10-CM

## 2017-06-11 DIAGNOSIS — R0683 Snoring: Secondary | ICD-10-CM

## 2017-06-11 LAB — HCG, QUANTITATIVE, PREGNANCY: QUANTITATIVE HCG: 0.12 m[IU]/mL

## 2017-06-11 MED FILL — valACYclovir HCL 1 GM TABS: 1 | 5 days supply | Qty: 5 | Fill #1

## 2017-06-11 NOTE — Progress Notes (Signed)
Patient states she can not keep any food down. Wants to rule out pregnancy

## 2017-06-11 NOTE — Telephone Encounter (Signed)
Order placed

## 2017-06-11 NOTE — Telephone Encounter (Signed)
PT requesting sleep study.  Please advise

## 2017-06-12 ENCOUNTER — Other Ambulatory Visit: Payer: Self-pay | Admitting: Family

## 2017-06-12 ENCOUNTER — Encounter: Payer: Self-pay | Admitting: Family

## 2017-06-12 ENCOUNTER — Ambulatory Visit (INDEPENDENT_AMBULATORY_CARE_PROVIDER_SITE_OTHER): Payer: 59 | Admitting: Family

## 2017-06-12 ENCOUNTER — Ambulatory Visit (HOSPITAL_BASED_OUTPATIENT_CLINIC_OR_DEPARTMENT_OTHER)
Admission: RE | Admit: 2017-06-12 | Discharge: 2017-06-12 | Disposition: A | Discharge status: Home / Self Care | Payer: 59 | Source: Ambulatory Visit | Attending: Family | Admitting: Family

## 2017-06-12 VITALS — BP 122/61 | HR 73 | Temp 98.6°F | Resp 16 | Ht 63.0 in | Wt 225.0 lb

## 2017-06-12 DIAGNOSIS — M2550 Pain in unspecified joint: Secondary | ICD-10-CM

## 2017-06-12 DIAGNOSIS — R0989 Other specified symptoms and signs involving the circulatory and respiratory systems: Secondary | ICD-10-CM

## 2017-06-12 DIAGNOSIS — R52 Pain, unspecified: Secondary | ICD-10-CM | POA: Diagnosis not present

## 2017-06-12 DIAGNOSIS — M5410 Radiculopathy, site unspecified: Secondary | ICD-10-CM | POA: Diagnosis not present

## 2017-06-12 DIAGNOSIS — R110 Nausea: Secondary | ICD-10-CM | POA: Diagnosis not present

## 2017-06-12 DIAGNOSIS — R05 Cough: Secondary | ICD-10-CM | POA: Diagnosis not present

## 2017-06-12 LAB — CBC WITH DIFFERENTIAL/PLATELET
BASOS ABS: 0 10*3/uL (ref 0.0–0.1)
BASOS PCT: 0.3 % (ref 0.0–3.0)
Eosinophils Absolute: 0.1 10*3/uL (ref 0.0–0.7)
Eosinophils Relative: 1.7 % (ref 0.0–5.0)
HEMATOCRIT: 42 % (ref 36.0–46.0)
Hemoglobin: 14 g/dL (ref 12.0–15.0)
LYMPHS PCT: 42.4 % (ref 12.0–46.0)
Lymphs Abs: 2.4 10*3/uL (ref 0.7–4.0)
MCHC: 33.4 g/dL (ref 30.0–36.0)
MCV: 93.4 fl (ref 78.0–100.0)
MONO ABS: 0.4 10*3/uL (ref 0.1–1.0)
Monocytes Relative: 6.2 % (ref 3.0–12.0)
NEUTROS ABS: 2.8 10*3/uL (ref 1.4–7.7)
Neutrophils Relative %: 49.4 % (ref 43.0–77.0)
PLATELETS: 298 10*3/uL (ref 150.0–400.0)
RBC: 4.49 Mil/uL (ref 3.87–5.11)
RDW: 13.1 % (ref 11.5–15.5)
WBC: 5.7 10*3/uL (ref 4.0–10.5)

## 2017-06-12 LAB — SEDIMENTATION RATE: Sed Rate: 10 mm/hr (ref 0–20)

## 2017-06-12 LAB — COMPREHENSIVE METABOLIC PANEL
ALT: 14 U/L (ref 0–35)
AST: 18 U/L (ref 0–37)
Albumin: 4 g/dL (ref 3.5–5.2)
Alkaline Phosphatase: 42 U/L (ref 39–117)
BILIRUBIN TOTAL: 0.9 mg/dL (ref 0.2–1.2)
BUN: 14 mg/dL (ref 6–23)
CHLORIDE: 104 meq/L (ref 96–112)
CO2: 29 meq/L (ref 19–32)
CREATININE: 0.82 mg/dL (ref 0.40–1.20)
Calcium: 9.6 mg/dL (ref 8.4–10.5)
GFR: 107.32 mL/min (ref >60.00)
GLUCOSE: 134 mg/dL — AB (ref 70–99)
Potassium: 3.8 mEq/L (ref 3.5–5.1)
Sodium: 138 mEq/L (ref 135–145)
Total Protein: 7.1 g/dL (ref 6.0–8.3)

## 2017-06-12 LAB — POC INFLUENZA A&B (BINAX/QUICKVUE)
INFLUENZA A, POC: NEGATIVE
Influenza B, POC: NEGATIVE

## 2017-06-12 MED ORDER — ALBUTEROL SULFATE HFA 108 (90 BASE) MCG/ACT IN AERS: 2.0000 | INHALATION_SPRAY | Freq: Four times a day (QID) | RESPIRATORY_TRACT | 0 refills | Status: DC | PRN

## 2017-06-12 MED ORDER — MELOXICAM 7.5 MG PO TABS: 7.5000 mg | ORAL_TABLET | Freq: Every day | ORAL | 0 refills | Status: DC

## 2017-06-12 NOTE — Patient Instructions (Signed)
Please begin meloxicam for leg pain/numbness.  Complete lab work prior to leaving. You may use zofran as needed for nausea. Call if new/worsening symptoms or if symptoms are not improved in 3 days.

## 2017-06-12 NOTE — Progress Notes (Signed)
Subjective:    Patient ID: Danielle Garcia, female    DOB: 1991-04-17, 26 y.o.   MRN: [id number]  HPI  Pt is a 26 yr old female who presents today with c/o body aches, HA and nausea. Reports that she has chronic body aches "for months." Reports daily HA since Saturday (frontal).  Today she woke up with numbness in the toes right foot and pain shooting down the back of the right leg. Reports mild low back pain.  Last depo due 10/31 declined. Neg preg test yesterday.  Mood is up and down. She has been taking 2 tabs of lexapro recently.  Feels "very tired all the time, like I could fall asleep right now."    Review of Systems See HPI  Past Medical History:  Diagnosis Date  . Asthma   . HTN (hypertension)      Social History   Socioeconomic History  . Marital status: Single    Spouse name: Not on file  . Number of children: Not on file  . Years of education: Not on file  . Highest education level: Not on file  Social Needs  . Financial resource strain: Not on file  . Food insecurity - worry: Not on file  . Food insecurity - inability: Not on file  . Transportation needs - medical: Not on file  . Transportation needs - non-medical: Not on file  Occupational History  . Not on file  Tobacco Use  . Smoking status: Never Smoker  . Smokeless tobacco: Never Used  Substance and Sexual Activity  . Alcohol use: No  . Drug use: No  . Sexual activity: Yes    Birth control/protection: Implant  Other Topics Concern  . Not on file  Social History Narrative  . Not on file    Past Surgical History:  Procedure Laterality Date  . CESAREAN SECTION      Family History  Problem Relation Age of Onset  . Hypertension Mother   . Heart attack Father 60  . Hypertension Maternal Grandmother   . Diabetes Maternal Grandmother   . Lung cancer Maternal Grandfather   . Diabetes Paternal Grandmother   . Allergies Daughter     No Known Allergies  Current Outpatient Medications on File  Prior to Visit  Medication Sig Dispense Refill  . clotrimazole (LOTRIMIN) 1 % cream Apply 1 application topically 2 (two) times daily. Use over areas on foot twice daily for 6 weeks. 60 g 0  . doxycycline (VIBRA-TABS) 100 MG tablet Take 1 tablet (100 mg total) 2 (two) times daily by mouth. 20 tablet 0  . escitalopram (LEXAPRO) 10 MG tablet Take 1.5 tablets (15 mg total) by mouth daily. 135 tablet 1  . ranitidine (ZANTAC) 150 MG capsule Take 1 capsule (150 mg total) by mouth 2 (two) times daily. 60 capsule 0  . valACYclovir (VALTREX) 1000 MG tablet Take 1 tablet (1,000 mg total) by mouth daily. PRN for recurrent HSV outbreaks 5 tablet 5   No current facility-administered medications on file prior to visit.     BP 122/61 (BP Location: Right Arm, Patient Position: Sitting, Cuff Size: Large)   Pulse 73   Temp 98.6 F (37 C) (Oral)   Resp 16   Ht 5\' 3"  (1.6 m)   Wt 225 lb (102.1 kg)   SpO2 100%   BMI 39.86 kg/m       Objective:   Physical Exam  Constitutional: She is oriented to person, place, and time. She appears  well-developed and well-nourished.  HENT:  Head: Normocephalic and atraumatic.  Right Ear: Tympanic membrane and ear canal normal.  Left Ear: Tympanic membrane and ear canal normal.  Neck: No thyromegaly present.  Cardiovascular: Normal rate, regular rhythm and normal heart sounds.  No murmur heard. Pulmonary/Chest: Effort normal. No respiratory distress. She has no wheezes. She has rhonchi in the left upper field, the left middle field and the left lower field.  Abdominal: Soft. There is no tenderness. There is no rigidity and no guarding.  Lymphadenopathy:    She has no cervical adenopathy.  Neurological: She is alert and oriented to person, place, and time.  Reflex Scores:      Patellar reflexes are 2+ on the right side and 2+ on the left side. Bilateral LE pain is 5/5  Psychiatric: She has a normal mood and affect. Her behavior is normal. Judgment and thought  content normal.          Assessment & Plan:  Low back pain with radiculopathy-will treat with meloxicam.  Consider further imaging if symptoms worsen or fail to improve.  Arthralgia-will repeat CBC and complete metabolic panel.  We will also check autoimmune panel including ANA, rheumatoid factor and sed rate.  Nausea-continue Zofran as needed.  Could represent viral etiology as well.  Follow-up if symptoms worsen or fail to improve.  Rhonchi- left sided- will obtain chest x ray to rule out pneumonia.  Flu swab is negative today.

## 2017-06-13 ENCOUNTER — Other Ambulatory Visit (INDEPENDENT_AMBULATORY_CARE_PROVIDER_SITE_OTHER): Payer: 59

## 2017-06-13 DIAGNOSIS — R739 Hyperglycemia, unspecified: Secondary | ICD-10-CM

## 2017-06-13 LAB — ANA: Anti Nuclear Antibody(ANA): NEGATIVE

## 2017-06-13 LAB — HEMOGLOBIN A1C: Hgb A1c MFr Bld: 5.5 % (ref 4.6–6.5)

## 2017-06-13 LAB — RHEUMATOID FACTOR: Rhuematoid fact SerPl-aCnc: <14 IU/mL (ref <14)

## 2017-06-19 ENCOUNTER — Encounter: Payer: Self-pay | Admitting: Family Medicine

## 2017-06-19 ENCOUNTER — Other Ambulatory Visit: Payer: Self-pay

## 2017-06-19 MED ORDER — VALACYCLOVIR HCL 1 G PO TABS: 1000.0000 mg | ORAL_TABLET | Freq: Every day | ORAL | 11 refills | Status: DC

## 2017-06-19 MED FILL — valACYclovir HCL 1 GM TABS: 1 | 30 days supply | Qty: 30 | Fill #0

## 2017-06-27 ENCOUNTER — Encounter: Payer: Self-pay | Admitting: Family Medicine

## 2017-06-27 ENCOUNTER — Ambulatory Visit (INDEPENDENT_AMBULATORY_CARE_PROVIDER_SITE_OTHER): Payer: 59 | Admitting: Family Medicine

## 2017-06-27 VITALS — BP 133/83 | HR 89 | Temp 98.3°F | Resp 16

## 2017-06-27 DIAGNOSIS — J020 Streptococcal pharyngitis: Secondary | ICD-10-CM

## 2017-06-27 LAB — POCT RAPID STREP A (OFFICE): Rapid Strep A Screen: POSITIVE — AB

## 2017-06-27 MED ORDER — AMOXICILLIN 500 MG PO TABS: 1000.0000 mg | ORAL_TABLET | Freq: Every day | ORAL | 0 refills | Status: DC

## 2017-06-27 MED FILL — AMOXICILLIN 500 MG CAPSULE: 500 | 10 days supply | Qty: 20 | Fill #0

## 2017-06-27 NOTE — Patient Instructions (Addendum)
Throw out toothbrush after 24 hours of being on antibiotic.  Sore Throat A sore throat is pain, burning, irritation, or scratchiness in the throat. When you have a sore throat, you may feel pain or tenderness in your throat when you swallow or talk. Many things can cause a sore throat, including:  An infection.  Seasonal allergies.  Dryness in the air.  Irritants, such as smoke or pollution.  Gastroesophageal reflux disease (GERD).  A tumor.  A sore throat is often the first sign of another sickness. It may happen with other symptoms, such as coughing, sneezing, fever, and swollen neck glands. Most sore throats go away without medical treatment. Follow these instructions at home:  Take over-the-counter medicines only as told by your health care provider.  Drink enough fluids to keep your urine clear or pale yellow.  Rest as needed.  To help with pain, try: ? Sipping warm liquids, such as broth, herbal tea, or warm water. ? Eating or drinking cold or frozen liquids, such as frozen ice pops. ? Gargling with a salt-water mixture 3-4 times a day or as needed. To make a salt-water mixture, completely dissolve -1 tsp of salt in 1 cup of warm water. ? Sucking on hard candy or throat lozenges. ? Putting a cool-mist humidifier in your bedroom at night to moisten the air. ? Sitting in the bathroom with the door closed for 5-10 minutes while you run hot water in the shower.  Do not use any tobacco products, such as cigarettes, chewing tobacco, and e-cigarettes. If you need help quitting, ask your health care provider. Contact a health care provider if:  You have a fever for more than 2-3 days.  You have symptoms that last (are persistent) for more than 2-3 days.  Your throat does not get better within 7 days.  You have a fever and your symptoms suddenly get worse. Get help right away if:  You have difficulty breathing.  You cannot swallow fluids, soft foods, or your  saliva.  You have increased swelling in your throat or neck.  You have persistent nausea and vomiting. This information is not intended to replace advice given to you by your health care provider. Make sure you discuss any questions you have with your health care provider. Document Released: 08/10/2004 Document Revised: 02/27/2016 Document Reviewed: 04/23/2015 Elsevier Interactive Patient Education  [employer].

## 2017-06-27 NOTE — Progress Notes (Signed)
SUBJECTIVE:   Danielle Garcia is a 26 y.o. female presents to the clinic for:  Chief Complaint  Patient presents with  . Sore Throat    Complains of sore throat for 4 days.  Other associated symptoms: L ear pain, myalgia and ST.  Denies: sinus congestion, sinus pain, wheezing, shortness of breath, fevers/rigors, and cough Sick Contacts: none known Therapy to date: Keflex, Ibuprofen  Social History   Tobacco Use  Smoking Status Never Smoker  Smokeless Tobacco Never Used    ROS: Pertinent items are noted in HPI  Patient's medications, allergies, past medical, surgical, social and family histories were reviewed and updated as appropriate.  OBJECTIVE:  BP 133/83 (BP Location: Left Arm, Cuff Size: Large)   Pulse 89   Temp 98.3 F (36.8 C) (Oral)   Resp 16   SpO2 100%  General: Awake, alert, appearing stated age Eyes: conjunctivae and sclerae clear Ears: normal TMs bilaterally Nose: no visible exudate Oropharynx: tonsils mildly erythematous, b/l exudate present Neck: supple, no significant adenopathy Lungs: clear to auscultation, no wheezes, rales or rhonchi, symmetric air entry, normal effort Heart: rate and rhythm regular Skin:reveals no rash Psych: Age appropriate judgment and insight  ASSESSMENT/PLAN:  Strep throat - Plan: amoxicillin (AMOXIL) 500 MG tablet, POCT rapid strep A  Orders as above. Amxn 1000 mg qd for 10 d.  Continue to practice good hand hygiene and push fluids. Ibuprofen and acetaminophen for pain. Replace toothbrush after 24 hours of being on abx. F/u prn. Pt voiced understanding and agreement to the plan.  [place], DO 06/27/17 10:13 AM

## 2017-07-12 ENCOUNTER — Encounter: Payer: Self-pay | Admitting: Obstetrics & Gynecology

## 2017-07-12 ENCOUNTER — Other Ambulatory Visit: Payer: Self-pay | Admitting: Medical

## 2017-07-12 ENCOUNTER — Other Ambulatory Visit: Payer: Self-pay | Admitting: Family Medicine

## 2017-07-12 ENCOUNTER — Ambulatory Visit (INDEPENDENT_AMBULATORY_CARE_PROVIDER_SITE_OTHER): Payer: 59 | Admitting: Obstetrics & Gynecology

## 2017-07-12 VITALS — BP 124/78 | HR 63 | Wt 242.0 lb

## 2017-07-12 DIAGNOSIS — F32A Depression, unspecified: Secondary | ICD-10-CM

## 2017-07-12 DIAGNOSIS — Z124 Encounter for screening for malignant neoplasm of cervix: Secondary | ICD-10-CM | POA: Diagnosis not present

## 2017-07-12 DIAGNOSIS — Z113 Encounter for screening for infections with a predominantly sexual mode of transmission: Secondary | ICD-10-CM | POA: Diagnosis not present

## 2017-07-12 DIAGNOSIS — F419 Anxiety disorder, unspecified: Principal | ICD-10-CM

## 2017-07-12 DIAGNOSIS — R102 Pelvic and perineal pain: Secondary | ICD-10-CM

## 2017-07-12 DIAGNOSIS — F329 Major depressive disorder, single episode, unspecified: Secondary | ICD-10-CM

## 2017-07-12 DIAGNOSIS — Z0000 Encounter for general adult medical examination without abnormal findings: Secondary | ICD-10-CM

## 2017-07-12 MED ORDER — LIDOCAINE HCL 2 % EX GEL: 1.0000 "application " | CUTANEOUS | 2 refills | Status: DC | PRN

## 2017-07-12 MED ORDER — ESCITALOPRAM OXALATE 20 MG PO TABS: 20.0000 mg | ORAL_TABLET | Freq: Every day | ORAL | 1 refills | Status: DC

## 2017-07-12 MED FILL — LIDOCAINE HCL 2% JELLY: 2 | 30 days supply | Qty: 30 | Fill #0

## 2017-07-12 MED FILL — ESCITALOPRAM 20 MG TABLET: 20 | 90 days supply | Qty: 90 | Fill #0

## 2017-07-12 MED FILL — raNITIdine HCL 150 MG TABS: 150 | 90 days supply | Qty: 180 | Fill #0

## 2017-07-12 NOTE — Telephone Encounter (Signed)
Patient needs lexapro refill, but is taking 2 per day--change instructions ??

## 2017-07-12 NOTE — Addendum Note (Signed)
Addended by: Emily Filbert on: 07/12/2017 01:32 PM   Modules accepted: Orders

## 2017-07-12 NOTE — Progress Notes (Signed)
Patient ID: Danielle Garcia, female   DOB: February 12, 1991, 26 y.o.   MRN: [id number]  Chief Complaint  Patient presents with  . Gynecologic Exam    pt states she is having vaginal burning/irritation only after intercourse.  pt states frequent urination- has dipped urine at work and has been clear.    HPI Danielle Garcia is a 26 y.o. female.  Single P1 (53 yo daughter) here today with the issue of post coital vulvar pain and a "sore" in the inside. "feels like carpet burn". 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 She is taking the valtrex daily 1 gram. She reports that she is getting lubricated. No new toys/lubricants. He does not use condoms. This started about last month. It will last about 1 hour after sex. She is not using contraception as she wants a pregnancy, taking PNVs. She has tried topical lidocaine on the vulva with the HSV outbreak. HPI  Past Medical History:  Diagnosis Date  . Asthma   . HTN (hypertension)     Past Surgical History:  Procedure Laterality Date  . CESAREAN SECTION      Family History  Problem Relation Age of Onset  . Hypertension Mother   . Heart attack Father 62  . Hypertension Maternal Grandmother   . Diabetes Maternal Grandmother   . Lung cancer Maternal Grandfather   . Diabetes Paternal Grandmother   . Allergies Daughter     Social History Social History   Tobacco Use  . Smoking status: Never Smoker  . Smokeless tobacco: Never Used  Substance Use Topics  . Alcohol use: No  . Drug use: No    No Known Allergies  Current Outpatient Medications  Medication Sig Dispense Refill  . cyclobenzaprine (FLEXERIL) 10 MG tablet Take 10 mg by mouth 3 (three) times daily as needed for muscle spasms.    Marland Kitchen escitalopram (LEXAPRO) 10 MG tablet Take 1.5 tablets (15 mg total) by mouth daily. 135 tablet 1  . ranitidine (ZANTAC) 150 MG capsule Take 1 capsule (150 mg total) by mouth 2 (two) times daily. 60 capsule 0  . valACYclovir (VALTREX)  1000 MG tablet Take 1 tablet (1,000 mg total) by mouth daily. PRN for recurrent HSV outbreaks 30 tablet 11  . albuterol (VENTOLIN HFA) 108 (90 Base) MCG/ACT inhaler Inhale 2 puffs into the lungs every 6 (six) hours as needed for wheezing or shortness of breath. 1 Inhaler 0  . clotrimazole (LOTRIMIN) 1 % cream Apply 1 application topically 2 (two) times daily. Use over areas on foot twice daily for 6 weeks. 60 g 0  . meloxicam (MOBIC) 7.5 MG tablet Take 1 tablet (7.5 mg total) by mouth daily. (Patient not taking: Reported on 07/12/2017) 14 tablet 0   No current facility-administered medications for this visit.     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  Blood pressure 124/78, pulse 63, weight 242 lb (109.8 kg).  Physical Exam Physical Exam  Well nourished, well hydrated Black female, no apparent distress Breathing, conversing, and ambulating normally Vulva, vagina, cervix appear normal  Data Reviewed   Assessment    Preventative care Post coital vulvovaginal pain- I suspect that this is due to HSV    Plan    Pap smear obtained Rec postcoital lidocaine prn       Chord Takahashi C Kenyatta Gloeckner 07/12/2017, 10:13 AM

## 2017-07-12 NOTE — Telephone Encounter (Signed)
Updated list per PCP instructions--change lexapro 10 mg to lexapro 20 mg once daily Sent in #90 to her local pharmacy

## 2017-07-13 ENCOUNTER — Telehealth: Payer: Self-pay

## 2017-07-13 MED ORDER — FLUOXETINE HCL 20 MG PO TABS: 20.0000 mg | ORAL_TABLET | Freq: Every day | ORAL | 3 refills | Status: DC

## 2017-07-13 MED FILL — FLUoxetine HCL 20 MG TABS: 20 | 30 days supply | Qty: 30 | Fill #0

## 2017-07-13 NOTE — Telephone Encounter (Signed)
Lexapro d/c'd. Prozac called in. Do not take both at same time. TY.

## 2017-07-13 NOTE — Telephone Encounter (Signed)
Pt is having side effects to Leapro pt states that she is having an decrease in her sex drive.

## 2017-07-18 ENCOUNTER — Telehealth: Payer: Self-pay | Admitting: *Deleted

## 2017-07-18 ENCOUNTER — Encounter: Payer: Self-pay | Admitting: Obstetrics & Gynecology

## 2017-07-18 LAB — CYTOLOGY - PAP
ADEQUACY: ABSENT
Chlamydia: NEGATIVE
Diagnosis: NEGATIVE
Neisseria Gonorrhea: NEGATIVE

## 2017-07-18 MED ORDER — FLUCONAZOLE 150 MG PO TABS: 150.0000 mg | ORAL_TABLET | Freq: Once | ORAL | 0 refills | Status: AC

## 2017-07-18 MED FILL — FLUCONAZOLE 150 MG TABLET: 150 | 1 days supply | Qty: 1 | Fill #0

## 2017-07-18 NOTE — Telephone Encounter (Signed)
Pt sent an email inquiring about her pap smear results.  She does have vaginal yeast and a RX for Diflucan was sent to [facility]

## 2017-07-19 ENCOUNTER — Telehealth: Payer: Self-pay

## 2017-07-19 NOTE — Telephone Encounter (Signed)
No simple thing. Would have to see a gyn and consider a treatment to stimulate ovulation. For now keep stress down, exercise regulaly, eat a heart healthy diet and minimize processed food because these things increase ability to ovulate.

## 2017-07-19 NOTE — Telephone Encounter (Signed)
Pt wants to know if there is anything she can take or do to start ovulation after being on depo. Pt an her partner are trying to conceive, and is becoming a little impatient waiting for menstrual cycle to return. Please Advise.

## 2017-07-20 ENCOUNTER — Ambulatory Visit (INDEPENDENT_AMBULATORY_CARE_PROVIDER_SITE_OTHER): Payer: 59 | Admitting: Psychology

## 2017-07-20 DIAGNOSIS — F4323 Adjustment disorder with mixed anxiety and depressed mood: Secondary | ICD-10-CM

## 2017-07-23 NOTE — Telephone Encounter (Signed)
Patient notified

## 2017-07-26 ENCOUNTER — Other Ambulatory Visit: Payer: Self-pay | Admitting: Family Medicine

## 2017-07-26 ENCOUNTER — Other Ambulatory Visit (INDEPENDENT_AMBULATORY_CARE_PROVIDER_SITE_OTHER): Payer: 59

## 2017-07-26 ENCOUNTER — Telehealth: Payer: Self-pay | Admitting: Family Medicine

## 2017-07-26 DIAGNOSIS — R195 Other fecal abnormalities: Secondary | ICD-10-CM

## 2017-07-26 DIAGNOSIS — R110 Nausea: Secondary | ICD-10-CM

## 2017-07-26 LAB — HCG, TOTAL, QUANTITATIVE: hCG, Beta Chain, Quant, S: <2 m[IU]/mL

## 2017-07-26 NOTE — Telephone Encounter (Signed)
Orders placed.

## 2017-07-26 NOTE — Addendum Note (Signed)
Addended by: Magdalene Molly A on: 07/26/2017 01:14 PM   Modules accepted: Orders

## 2017-07-26 NOTE — Addendum Note (Signed)
Addended by: Caffie Pinto on: 07/26/2017 03:55 PM   Modules accepted: Orders

## 2017-07-26 NOTE — Telephone Encounter (Signed)
Patient called in and stated she wants to have her  gallbladder checked she says shes in pain feel like she has to throw up and loss stools for about a week and a half    Please advise

## 2017-07-26 NOTE — Addendum Note (Signed)
Addended by: Caffie Pinto on: 07/26/2017 02:47 PM   Modules accepted: Orders

## 2017-07-26 NOTE — Telephone Encounter (Signed)
Without being able to examine her, I feel that a stool culture would be more beneficial than a scan.

## 2017-07-27 ENCOUNTER — Encounter: Payer: Self-pay | Admitting: Family Medicine

## 2017-07-27 ENCOUNTER — Other Ambulatory Visit (HOSPITAL_BASED_OUTPATIENT_CLINIC_OR_DEPARTMENT_OTHER): Payer: Self-pay

## 2017-07-27 ENCOUNTER — Other Ambulatory Visit: Payer: Self-pay

## 2017-07-27 ENCOUNTER — Ambulatory Visit (INDEPENDENT_AMBULATORY_CARE_PROVIDER_SITE_OTHER): Payer: 59 | Admitting: Family Medicine

## 2017-07-27 VITALS — BP 130/88 | HR 97 | Temp 98.3°F | Ht 63.0 in | Wt 242.0 lb

## 2017-07-27 DIAGNOSIS — R1011 Right upper quadrant pain: Secondary | ICD-10-CM | POA: Diagnosis not present

## 2017-07-27 DIAGNOSIS — R142 Eructation: Secondary | ICD-10-CM | POA: Diagnosis not present

## 2017-07-27 MED ORDER — OMEPRAZOLE 20 MG PO CPDR: 20.0000 mg | DELAYED_RELEASE_CAPSULE | Freq: Two times a day (BID) | ORAL | 1 refills | Status: DC

## 2017-07-27 MED ORDER — GI COCKTAIL ~~LOC~~: 30.0000 mL | Freq: Two times a day (BID) | ORAL | 0 refills | Status: DC

## 2017-07-27 MED FILL — GERI-LANTA LIQUID: 200-200-20 | 6 days supply | Qty: 355 | Fill #0

## 2017-07-27 MED FILL — OMEPRAZOLE 20 MG CAP: 20 | 30 days supply | Qty: 60 | Fill #0

## 2017-07-27 NOTE — Progress Notes (Signed)
Chief Complaint  Patient presents with  . Nausea    Subjective: Patient is a 27 y.o. female here for bloating/nausea.  With patient is a 1 month history of bloating, nausea, gas, and increased frequency of stools.  She did leave a stool culture and tested negative for pregnancy both with urine and blood.  Over the past week, her symptoms have gotten worse.  She has been on Zofran and Zantac, neither of which have been particularly helpful.  She was also given a prescription for a GI cocktail that was marginally helpful.  She has not vomited.  She is not having any bleeding, fevers, nighttime awakenings, or unintentional weight loss.  She notes the pain is worse after eating meals in the right upper quadrant.  She has never had a cholecystectomy.  She once ate fried chicken and had the worst pain in the right upper quadrant and has not been eating greasy foods since then.  She was given some information on the FODMAP elimination diet.   ROS: Const: No fevers GI: As noted in HPI  Family History  Problem Relation Age of Onset  . Hypertension Mother   . Heart attack Father 42  . Hypertension Maternal Grandmother   . Diabetes Maternal Grandmother   . Lung cancer Maternal Grandfather   . Diabetes Paternal Grandmother   . Allergies Daughter    Past Medical History:  Diagnosis Date  . Asthma   . HTN (hypertension)    No Known Allergies  Current Outpatient Medications:  .  Alum & Mag Hydroxide-Simeth (GI COCKTAIL) SUSP suspension, Take 30 mLs by mouth 2 (two) times daily. Shake well., Disp: 150 mL, Rfl: 0 .  FLUoxetine (PROZAC) 20 MG tablet, Take 1 tablet (20 mg total) by mouth daily., Disp: 30 tablet, Rfl: 3 .  lidocaine (XYLOCAINE) 2 % jelly, Apply 1 application topically as needed., Disp: 30 mL, Rfl: 2 .  valACYclovir (VALTREX) 1000 MG tablet, Take 1 tablet (1,000 mg total) by mouth daily. PRN for recurrent HSV outbreaks, Disp: 30 tablet, Rfl: 11 .  omeprazole (PRILOSEC) 20 MG capsule,  Take 1 capsule (20 mg total) by mouth 2 (two) times daily before a meal., Disp: 60 capsule, Rfl: 1  Objective: BP 130/88 (BP Location: Left Arm, Patient Position: Sitting, Cuff Size: Large)   Pulse 97   Temp 98.3 F (36.8 C) (Oral)   Ht 5\' 3"  (1.6 m)   Wt 242 lb (109.8 kg)   SpO2 98%   BMI 42.87 kg/m  General: Awake, appears stated age HEENT: MMM, EOMi Heart: RRR Lungs: CTAB, no rales, wheezes or rhonchi. No accessory muscle use Abd: BS+, soft, ttp in epigastric region, neg Murphy's, ND, no masses or organomegaly Psych: Age appropriate judgment and insight, normal affect and mood  Assessment and Plan: Postprandial RUQ pain - Plan: US Abdomen Limited RUQ  Belching  Orders as above. Ck gall bladder. Sounds like some portion of this could be related to reflux, will try PPI. Elevate HOB. Wt loss encouraged. Eructation diet info given. Try fiber and stay well hydrated with water for frequent stools.  F/u prn. The patient voiced understanding and agreement to the plan.  [place], DO 07/27/17  12:21 PM

## 2017-07-27 NOTE — Patient Instructions (Addendum)
Try some Metamucil, drink lots of water. Elevate the head of your bed. Continue with your weight loss efforts.  Stop chewing gum, drinking carbonated beverages, gulping liquids, and drinking alcohol to help with belching.  These foods may cause you to belch more: Wheat, barley, rye, onion, leek, white part of spring onion, garlic, shallots, artichokes, beetroot, fennel, peas, chicory, pistachio, cashews, legumes, lentils, and chickpeas; Milk, custard, ice cream, and yogurt; Apples, pears, mangoes, cherries, watermelon, asparagus, sugar snap peas, honey, high-fructose corn syrup; Apricots, nectarines, peaches, plums, mushrooms, cauliflower, artificially sweetened chewing gum and confectionery  Let us know if you need anything.

## 2017-07-28 ENCOUNTER — Ambulatory Visit (HOSPITAL_BASED_OUTPATIENT_CLINIC_OR_DEPARTMENT_OTHER)
Admission: RE | Admit: 2017-07-28 | Discharge: 2017-07-28 | Disposition: A | Discharge status: Home / Self Care | Payer: 59 | Source: Ambulatory Visit | Attending: Family Medicine | Admitting: Family Medicine

## 2017-07-28 DIAGNOSIS — R110 Nausea: Secondary | ICD-10-CM | POA: Diagnosis not present

## 2017-07-28 DIAGNOSIS — R1011 Right upper quadrant pain: Secondary | ICD-10-CM | POA: Insufficient documentation

## 2017-07-30 LAB — STOOL CULTURE
MICRO NUMBER: 90040562
MICRO NUMBER:: 90040560
MICRO NUMBER:: 90040561
SHIGA RESULT: NOT DETECTED
SPECIMEN QUALITY: ADEQUATE
SPECIMEN QUALITY:: ADEQUATE
SPECIMEN QUALITY:: ADEQUATE

## 2017-07-30 LAB — OVA AND PARASITE EXAMINATION
CONCENTRATE RESULT:: NONE SEEN
SPECIMEN QUALITY:: ADEQUATE
TRICHROME RESULT:: NONE SEEN
VKL: 90040572

## 2017-08-06 ENCOUNTER — Telehealth: Payer: Self-pay | Admitting: Family Medicine

## 2017-08-06 NOTE — Telephone Encounter (Signed)
We can discuss some options this week. TY.

## 2017-08-06 NOTE — Telephone Encounter (Signed)
Patient is struggling to lose weight on her own, but has not been successful. She would like to know if PCP would be willing to prescribe weight loss medication for her.  She has tried phentermine in the past, 2 years ago.  She did well on that, but had to stop due to insurance changing. Advise please

## 2017-08-07 ENCOUNTER — Ambulatory Visit (HOSPITAL_BASED_OUTPATIENT_CLINIC_OR_DEPARTMENT_OTHER): Payer: 59 | Attending: Family Medicine | Admitting: Internal Medicine

## 2017-08-07 VITALS — Ht 63.0 in | Wt 240.0 lb

## 2017-08-07 DIAGNOSIS — R0683 Snoring: Secondary | ICD-10-CM | POA: Insufficient documentation

## 2017-08-07 DIAGNOSIS — R5383 Other fatigue: Secondary | ICD-10-CM | POA: Insufficient documentation

## 2017-08-07 NOTE — Telephone Encounter (Signed)
Patient informed of PCP instructions. 

## 2017-08-09 MED FILL — PHENTERMINE HCL 15 MG CAPS: 15 | 30 days supply | Qty: 30 | Fill #0

## 2017-08-10 ENCOUNTER — Ambulatory Visit (INDEPENDENT_AMBULATORY_CARE_PROVIDER_SITE_OTHER): Payer: 59 | Admitting: Family Medicine

## 2017-08-10 ENCOUNTER — Ambulatory Visit (HOSPITAL_BASED_OUTPATIENT_CLINIC_OR_DEPARTMENT_OTHER)
Admission: RE | Admit: 2017-08-10 | Discharge: 2017-08-10 | Disposition: A | Discharge status: Home / Self Care | Payer: 59 | Source: Ambulatory Visit | Attending: Family Medicine | Admitting: Family Medicine

## 2017-08-10 ENCOUNTER — Encounter: Payer: Self-pay | Admitting: Family Medicine

## 2017-08-10 VITALS — BP 120/90 | HR 72 | Temp 99.2°F | Ht 63.0 in | Wt 247.0 lb

## 2017-08-10 DIAGNOSIS — M25572 Pain in left ankle and joints of left foot: Secondary | ICD-10-CM

## 2017-08-10 DIAGNOSIS — S99912A Unspecified injury of left ankle, initial encounter: Secondary | ICD-10-CM | POA: Diagnosis not present

## 2017-08-10 DIAGNOSIS — M7989 Other specified soft tissue disorders: Secondary | ICD-10-CM | POA: Diagnosis not present

## 2017-08-10 MED ORDER — HYDROCODONE-ACETAMINOPHEN 5-325 MG PO TABS: 1.0000 | ORAL_TABLET | Freq: Four times a day (QID) | ORAL | 0 refills | Status: DC | PRN

## 2017-08-10 NOTE — Patient Instructions (Addendum)
Ice/cold pack over area for 10-15 min every 2-3 hours while awake.  Compression.  Ibuprofen 400-600 mg (2-3 over the counter strength tabs) every 6 hours as needed for pain.  Ankle Exercises It is normal to feel mild stretching, pulling, tightness, or discomfort as you do these exercises, but you should stop right away if you feel sudden pain or your pain gets worse.  Stretching and range of motion exercises These exercises warm up your muscles and joints and improve the movement and flexibility of your ankle. These exercises also help to relieve pain, numbness, and tingling. Exercise A: Dorsiflexion/Plantar Flexion   1. Sit with your affected knee straight or bent. Do not rest your foot on anything. 2. Flex your affected ankle to tilt the top of your foot toward your shin. 3. Hold this position for 5 seconds. 4. Point your toes downward to tilt the top of your foot away from your shin. 5. Hold this position for 5 seconds. Repeat 2 times. Complete this exercise 3 times per week. Exercise B: Ankle Alphabet   1. Sit with your affected foot supported at your lower leg. ? Do not rest your foot on anything. ? Make sure your foot has room to move freely. 2. Think of your affected foot as a paintbrush, and move your foot to trace each letter of the alphabet in the air. Keep your hip and knee still while you trace. Make the letters as large as you can without increasing any discomfort. 3. Trace every letter from A to Z. Repeat 2 times. Complete this exercise 3 times per week. Strengthening exercises These exercises build strength and endurance in your ankle. Endurance is the ability to use your muscles for a long time, even after they get tired. Exercise D: Dorsiflexors   1. Secure a rubber exercise band or tube to an object, such as a table leg, that will stay still when the band is pulled. Secure the other end around your affected foot. 2. Sit on the floor, facing the object with your  affected leg extended. The band or tube should be slightly tense when your foot is relaxed. 3. Slowly flex your affected ankle and toes to bring your foot toward you. 4. Hold this position for 3 seconds.  5. Slowly return your foot to the starting position, controlling the band as you do that. Do a total of 10 repetitions. Repeat 2 times. Complete this exercise 3 times per week. Exercise E: Plantar Flexors   1. Sit on the floor with your affected leg extended. 2. Loop a rubber exercise band or tube around the ball of your affected foot. The ball of your foot is on the walking surface, right under your toes. The band or tube should be slightly tense when your foot is relaxed. 3. Slowly point your toes downward, pushing them away from you. 4. Hold this position for 3 seconds. 5. Slowly release the tension in the band or tube, controlling smoothly until your foot is back in the starting position. Repeat for a total of 10 repetitions. Repeat 2 times. Complete this exercise 3 times per week. Exercise F: Towel Curls   1. Sit in a chair on a non-carpeted surface, and put your feet on the floor. 2. Place a towel in front of your feet.  3. Keeping your heel on the floor, put your affected foot on the towel. 4. Pull the towel toward you by grabbing the towel with your toes and curling them under. Keep your  heel on the floor. 5. Let your toes relax. 6. Grab the towel again. Keep going until the towel is completely underneath your foot. Repeat for a total of 10 repetitions. Repeat 2 times. Complete this exercise 3 times per week. Exercise G: Heel Raise ( Plantar Flexors, Standing)    1. Stand with your feet shoulder-width apart. 2. Keep your weight spread evenly over the width of your feet while you rise up on your toes. Use a wall or table to steady yourself, but try not to use it for support. 3. If this exercise is too easy, try these options: ? Shift your weight toward your affected leg  until you feel challenged. ? If told by your health care provider, lift your uninjured leg off the floor. 4. Hold this position for 3 seconds. Repeat for a total of 10 repetitions. Repeat 2 times. Complete this exercise 3 times per week. Exercise H: Tandem Walking 1. Stand with one foot directly in front of the other. 2. Slowly raise your back foot up, lifting your heel before your toes, and place it directly in front of your other foot. 3. Continue to walk in this heel-to-toe way for 10 steps or for as long as told by your health care provider. Have a countertop or wall nearby to use if needed to keep your balance, but try not to hold onto anything for support. Repeat 2 times. Complete this exercises 3 times per week. Make sure you discuss any questions you have with your health care provider. Document Released: 05/17/2005 Document Revised: 03/02/2016 Document Reviewed: 03/21/2015 Elsevier Interactive Patient Education  2018 [employer].

## 2017-08-10 NOTE — Progress Notes (Addendum)
Musculoskeletal Exam  Patient: Danielle Garcia DOB: 1991-02-18  DOS: 08/10/2017  SUBJECTIVE:  Chief Complaint:   Chief Complaint  Patient presents with  . Ankle Pain    Danielle Garcia is a 27 y.o.  female for evaluation and treatment of L ankle pain.   Onset:  4 hrs ago. She suddenly fell and hit her R knee and twisted her L ankle.  Location: L lat ankle Character:  sharp  Progression of issue:  has worsened slightly Associated symptoms: swelling Treatment: to date has been ice and bracing.   Neurovascular symptoms: no  ROS: Musculoskeletal/Extremities: +L ankle pain  Past Medical History:  Diagnosis Date  . Asthma   . HTN (hypertension)     Objective: VITAL SIGNS: BP 120/90 (BP Location: Left Arm, Patient Position: Sitting, Cuff Size: Large)   Pulse 72   Temp 99.2 F (37.3 C) (Oral)   Ht 5\' 3"  (1.6 m)   Wt 247 lb (112 kg)   SpO2 99%   BMI 43.75 kg/m  Constitutional: Well formed, well developed. No acute distress. Cardiovascular: Brisk cap refill Thorax & Lungs: No accessory muscle use Musculoskeletal: L ankle.   Normal active range of motion: no.   Normal passive range of motion: no Tenderness to palpation: Yes over L lateral malleolus Deformity: no Ecchymosis: no Tests negative: Squeeze, anterior drawer Antalgic gait No TTP over prox fibular head. No TTP over medial malleolus, prox 5th MT or navicular bone Neurologic: Normal sensory function. No focal deficits noted. Psychiatric: Normal mood. Age appropriate judgment and insight. Alert & oriented x 3.    Assessment:  Acute left ankle pain - Plan: DG Ankle Complete Left  Plan: Orders as above. XR unofficially shows no acute process, await official read. Ice, NSAIDs, Tylenol, home stretches/exercises. Norco for breakthru pain.  F/u prn. The patient voiced understanding and agreement to the plan.   [place], DO 08/10/17  4:47 PM

## 2017-08-10 NOTE — Addendum Note (Signed)
Addended by: Ames Coupe on: 08/10/2017 07:51 PM   Modules accepted: Orders

## 2017-08-14 MED FILL — valACYclovir HCL 1 GM TABS: 1 | 30 days supply | Qty: 30 | Fill #1

## 2017-08-14 MED FILL — FLUoxetine HCL 20 MG TABS: 20 | 30 days supply | Qty: 30 | Fill #1

## 2017-08-17 ENCOUNTER — Ambulatory Visit (INDEPENDENT_AMBULATORY_CARE_PROVIDER_SITE_OTHER): Payer: 59 | Admitting: Psychology

## 2017-08-17 DIAGNOSIS — F4323 Adjustment disorder with mixed anxiety and depressed mood: Secondary | ICD-10-CM | POA: Diagnosis not present

## 2017-08-18 DIAGNOSIS — R0683 Snoring: Secondary | ICD-10-CM

## 2017-08-18 NOTE — Procedures (Signed)
    Patient Name: Danielle Garcia, Danielle Garcia Date: 08/07/2017 Gender: Female D.O.B: 1990/08/17 Age (years): 26 Referring Provider: Riki Sheer DO Height (inches): 30 Interpreting Physician: Baird Lyons MD, ABSM Weight (lbs): 240 RPSGT: Laren Everts BMI: 43 MRN: [id number] Neck Size: 17.00 <br> <br> CLINICAL INFORMATION Sleep Study Type: NPSG Indication for sleep study: Daytime Fatigue, Depression, Fatigue, Hypertension, Insomnia, Morning Headaches, Non-refreshing Sleep, Obesity, Restless Sleep with Limb Movments, Snoring, Witnesses Apnea / Gasping During Sleep  Epworth Sleepiness Score: 16  SLEEP STUDY TECHNIQUE As per the AASM Manual for the Scoring of Sleep and Associated Events v2.3 (April 2016) with a hypopnea requiring 4% desaturations.  The channels recorded and monitored were frontal, central and occipital EEG, electrooculogram (EOG), submentalis EMG (chin), nasal and oral airflow, thoracic and abdominal wall motion, anterior tibialis EMG, snore microphone, electrocardiogram, and pulse oximetry.  MEDICATIONS Medications self-administered by patient taken the night of the study : none reported  SLEEP ARCHITECTURE The study was initiated at 9:43:34 PM and ended at 4:42:28 AM.  Sleep onset time was 13.7 minutes and the sleep efficiency was 90.9%. The total sleep time was 380.7 minutes.  Stage REM latency was 107.5 minutes.  The patient spent 6.75% of the night in stage N1 sleep, 68.03% in stage N2 sleep, 0.00% in stage N3 and 25.22% in REM.  Alpha intrusion was absent.  Supine sleep was 22.21%.  RESPIRATORY PARAMETERS The overall apnea/hypopnea index (AHI) was 0.2 per hour. There were 0 total apneas, including 0 obstructive, 0 central and 0 mixed apneas. There were 1 hypopneas and 7 RERAs.  The AHI during Stage REM sleep was 0.0 per hour.  AHI while supine was 0.0 per hour.  The mean oxygen saturation was 96.96%. The minimum SpO2 during sleep was  93.00%.  loud snoring was noted during this study.  CARDIAC DATA The 2 lead EKG demonstrated sinus rhythm. The mean heart rate was 69.00 beats per minute. Other EKG findings include: None.  LEG MOVEMENT DATA The total PLMS were 0 with a resulting PLMS index of 0.00. Associated arousal with leg movement index was 0.0 .  IMPRESSIONS - No significant obstructive sleep apnea occurred during this study (AHI = 0.2/h). - No significant central sleep apnea occurred during this study (CAI = 0.0/h). - Insignificant oxygen desaturation was noted during this study (Min O2 = 93.00%). - The patient snored with loud snoring volume. - No cardiac abnormalities were noted during this study. - Clinically significant periodic limb movements did not occur during sleep. No significant associated arousals.  DIAGNOSIS - Primary snoring  RECOMMENDATIONS - Be careful with alcohol, sedatives and other CNS depressants that may worsen sleep apnea and disrupt normal sleep architecture. - Sleep hygiene should be reviewed to assess factors that may improve sleep quality. - Weight management and regular exercise should be initiated or continued if appropriate.  [Electronically signed] 08/18/2017 11:17 AM  Baird Lyons MD, ABSM Diplomate, American Board of Sleep Medicine   NPI: [id number]                         [place] [place] [facility]  ELECTRONICALLY SIGNED ON:  08/18/2017, 11:15 AM [place] PH: [phone]   FX: [phone] [place]

## 2017-08-31 ENCOUNTER — Ambulatory Visit (INDEPENDENT_AMBULATORY_CARE_PROVIDER_SITE_OTHER): Payer: 59 | Admitting: Psychology

## 2017-08-31 DIAGNOSIS — F4323 Adjustment disorder with mixed anxiety and depressed mood: Secondary | ICD-10-CM

## 2017-09-13 ENCOUNTER — Telehealth: Payer: Self-pay | Admitting: Family Medicine

## 2017-09-13 MED ORDER — PHENTERMINE HCL 37.5 MG PO TABS: 37.5000 mg | ORAL_TABLET | Freq: Every day | ORAL | 1 refills | Status: DC

## 2017-09-13 MED FILL — PHENTERMINE 37.5 MG TABLET: 37.5 | 30 days supply | Qty: 30 | Fill #0

## 2017-09-13 MED FILL — FLUoxetine HCL 20 MG TABS: 20 | 30 days supply | Qty: 30 | Fill #2

## 2017-09-13 MED FILL — OMEPRAZOLE 20 MG CAP: 20 | 30 days supply | Qty: 60 | Fill #1

## 2017-09-13 NOTE — Telephone Encounter (Signed)
Pt is down 13 lbs thus far, will cont for 60 more days.

## 2017-09-13 NOTE — Telephone Encounter (Signed)
Patient wants to get phentermine 37.5 mg sent to the pharmacy

## 2017-09-19 ENCOUNTER — Telehealth: Payer: Self-pay | Admitting: Family Medicine

## 2017-09-19 MED ORDER — MOMETASONE FUROATE 50 MCG/ACT NA SUSP: 2.0000 | Freq: Every day | NASAL | 12 refills | Status: DC

## 2017-09-19 MED FILL — MOMETASONE FUROATE 50 MCG S: 50 | 60 days supply | Qty: 17 | Fill #0

## 2017-09-19 NOTE — Telephone Encounter (Signed)
Patient called in stating has a lot of drainage in the back of her throat.  Mornings need to cough a lot. She has no fever and no throat pain. This has been going on for months. Please send in prescription to [facility]

## 2017-09-21 ENCOUNTER — Other Ambulatory Visit (INDEPENDENT_AMBULATORY_CARE_PROVIDER_SITE_OTHER): Payer: 59

## 2017-09-21 ENCOUNTER — Other Ambulatory Visit: Payer: Self-pay | Admitting: Family Medicine

## 2017-09-21 ENCOUNTER — Ambulatory Visit (INDEPENDENT_AMBULATORY_CARE_PROVIDER_SITE_OTHER): Payer: 59 | Admitting: Psychology

## 2017-09-21 ENCOUNTER — Telehealth: Payer: Self-pay | Admitting: Family Medicine

## 2017-09-21 DIAGNOSIS — N926 Irregular menstruation, unspecified: Secondary | ICD-10-CM

## 2017-09-21 DIAGNOSIS — F4323 Adjustment disorder with mixed anxiety and depressed mood: Secondary | ICD-10-CM | POA: Diagnosis not present

## 2017-09-21 LAB — HCG, TOTAL, QUANTITATIVE: hCG, Beta Chain, Quant, S: <2 m[IU]/mL

## 2017-09-21 NOTE — Telephone Encounter (Signed)
[facility]-   Hcg- total quant- less than 2

## 2017-10-05 ENCOUNTER — Ambulatory Visit: Payer: 59 | Admitting: Psychology

## 2017-10-08 MED FILL — valACYclovir HCL 1 GM TABS: 1 | 30 days supply | Qty: 30 | Fill #2

## 2017-10-09 ENCOUNTER — Telehealth: Payer: Self-pay | Admitting: Family Medicine

## 2017-10-09 ENCOUNTER — Other Ambulatory Visit: Payer: Self-pay | Admitting: *Deleted

## 2017-10-09 ENCOUNTER — Other Ambulatory Visit (INDEPENDENT_AMBULATORY_CARE_PROVIDER_SITE_OTHER): Payer: 59

## 2017-10-09 DIAGNOSIS — N912 Amenorrhea, unspecified: Secondary | ICD-10-CM

## 2017-10-09 LAB — HCG, TOTAL, QUANTITATIVE: hCG, Beta Chain, Quant, S: <2 m[IU]/mL

## 2017-10-09 NOTE — Telephone Encounter (Addendum)
Danielle Garcia from [facility] to give verbal report: HCG total quantitative is less than 2. Result read back. Routing to PCP office. Called PCP office and spoke to [place] and gave her results. Results are also in Epic.

## 2017-10-09 NOTE — Progress Notes (Signed)
hcg 

## 2017-10-10 ENCOUNTER — Telehealth: Payer: Self-pay | Admitting: Family Medicine

## 2017-10-10 MED ORDER — NORETHINDRONE ACETATE 5 MG PO TABS: 5.0000 mg | ORAL_TABLET | Freq: Every day | ORAL | 0 refills | Status: DC

## 2017-10-10 MED FILL — NORETHINDRONE 5 MG TABLET: 5 | 7 days supply | Qty: 7 | Fill #0

## 2017-10-10 NOTE — Telephone Encounter (Signed)
Patient states no menstrual cycle this month--all pregnancy test are negative---patient having extreme fatigue and nausea---Advise please on what

## 2017-10-11 ENCOUNTER — Other Ambulatory Visit: Payer: Self-pay | Admitting: Family Medicine

## 2017-10-11 ENCOUNTER — Ambulatory Visit (INDEPENDENT_AMBULATORY_CARE_PROVIDER_SITE_OTHER): Payer: 59 | Admitting: Family Medicine

## 2017-10-11 ENCOUNTER — Encounter: Payer: Self-pay | Admitting: Family Medicine

## 2017-10-11 VITALS — BP 116/77 | HR 85 | Temp 98.2°F | Wt 240.0 lb

## 2017-10-11 DIAGNOSIS — M791 Myalgia, unspecified site: Secondary | ICD-10-CM

## 2017-10-11 LAB — COMPREHENSIVE METABOLIC PANEL
ALT: 17 U/L (ref 0–35)
AST: 20 U/L (ref 0–37)
Albumin: 3.6 g/dL (ref 3.5–5.2)
Alkaline Phosphatase: 38 U/L — ABNORMAL LOW (ref 39–117)
BUN: 13 mg/dL (ref 6–23)
CO2: 29 meq/L (ref 19–32)
CREATININE: 0.63 mg/dL (ref 0.40–1.20)
Calcium: 8.9 mg/dL (ref 8.4–10.5)
Chloride: 104 mEq/L (ref 96–112)
GFR: 145.12 mL/min (ref >60.00)
GLUCOSE: 113 mg/dL — AB (ref 70–99)
Potassium: 3.6 mEq/L (ref 3.5–5.1)
SODIUM: 138 meq/L (ref 135–145)
Total Bilirubin: 0.4 mg/dL (ref 0.2–1.2)
Total Protein: 6.6 g/dL (ref 6.0–8.3)

## 2017-10-11 LAB — CBC
HCT: 39.1 % (ref 36.0–46.0)
Hemoglobin: 13 g/dL (ref 12.0–15.0)
MCHC: 33.3 g/dL (ref 30.0–36.0)
MCV: 94 fl (ref 78.0–100.0)
Platelets: 314 10*3/uL (ref 150.0–400.0)
RBC: 4.15 Mil/uL (ref 3.87–5.11)
RDW: 13.2 % (ref 11.5–15.5)
WBC: 5.4 10*3/uL (ref 4.0–10.5)

## 2017-10-11 LAB — VITAMIN D 25 HYDROXY (VIT D DEFICIENCY, FRACTURES): VITD: 12.12 ng/mL — ABNORMAL LOW (ref 30.00–100.00)

## 2017-10-11 MED ORDER — VITAMIN D (ERGOCALCIFEROL) 1.25 MG (50000 UNIT) PO CAPS: 50000.0000 [IU] | ORAL_CAPSULE | ORAL | 0 refills | Status: DC

## 2017-10-11 MED ORDER — IBUPROFEN 600 MG PO TABS: 600.0000 mg | ORAL_TABLET | Freq: Three times a day (TID) | ORAL | 0 refills | Status: DC | PRN

## 2017-10-11 MED FILL — VIT D2 1.25 MG (50,000 UNIT: 1.25 MG | 84 days supply | Qty: 12 | Fill #0

## 2017-10-11 MED FILL — IBUPROFEN 600 MG TABLET: 600 | 10 days supply | Qty: 30 | Fill #0

## 2017-10-11 NOTE — Progress Notes (Signed)
Chief Complaint  Patient presents with  . Generalized Body Aches    Subjective: Patient is a 27 y.o. female here for myalgias.  Over the past 2 days, the patient has been experiencing upper back, middle of the back, and lower back pain.  She also has some pain in her lower extremities bilaterally.  She denies any injury or change in activity.  It is equal on both sides.  She has not tried any medication for this other than a muscle relaxant which was not helpful.  She maintains that she is drinking adequate amounts of fluids.  She is not having any rashes, fevers, URI symptoms, abdominal pain, numbness, tingling, weakness, diarrhea, urinary complaints.  She is late for her cycle, however both serum and urine hCG is unremarkable.  She was recently prescribed a 7-day course of Aygestin given this.   ROS: MSK: +pain  Family History  Problem Relation Age of Onset  . Hypertension Mother   . Heart attack Father 67  . Hypertension Maternal Grandmother   . Diabetes Maternal Grandmother   . Lung cancer Maternal Grandfather   . Diabetes Paternal Grandmother   . Allergies Daughter    Past Medical History:  Diagnosis Date  . Asthma   . HTN (hypertension)    No Known Allergies  Current Outpatient Medications:  .  FLUoxetine (PROZAC) 20 MG tablet, Take 1 tablet (20 mg total) by mouth daily., Disp: 30 tablet, Rfl: 3 .  lidocaine (XYLOCAINE) 2 % jelly, Apply 1 application topically as needed., Disp: 30 mL, Rfl: 2 .  mometasone (NASONEX) 50 MCG/ACT nasal spray, Place 2 sprays into the nose daily., Disp: 17 g, Rfl: 12 .  norethindrone (AYGESTIN) 5 MG tablet, Take 1 tablet (5 mg total) by mouth daily for 7 days., Disp: 7 tablet, Rfl: 0 .  omeprazole (PRILOSEC) 20 MG capsule, Take 1 capsule (20 mg total) by mouth 2 (two) times daily before a meal., Disp: 60 capsule, Rfl: 1 .  valACYclovir (VALTREX) 1000 MG tablet, Take 1 tablet (1,000 mg total) by mouth daily. PRN for recurrent HSV outbreaks, Disp:  30 tablet, Rfl: 11 .  ibuprofen (ADVIL,MOTRIN) 600 MG tablet, Take 1 tablet (600 mg total) by mouth every 8 (eight) hours as needed., Disp: 30 tablet, Rfl: 0  Objective: BP 116/77 (BP Location: Left Arm, Patient Position: Sitting, Cuff Size: Normal)   Pulse 85   Temp 98.2 F (36.8 C) (Oral)   Wt 240 lb (108.9 kg)   SpO2 98%   BMI 42.51 kg/m  General: Awake, appears stated age HEENT: MMM, EOMi, nares are patent, ears negative Heart: RRR, no murmurs Lungs: CTAB, no rales, wheezes or rhonchi. No accessory muscle use Abd: BS+, soft, NT, ND, no masses or organomegaly MSK: +TTP over paraspinal msk throughout back, 5/5 strength throughout, gait normal Neuro: no cerebellar signs, sensation grossly intact, reflexes are normal Psych: Age appropriate judgment and insight, normal affect and mood  Assessment and Plan: Myalgia - Plan: CBC, Comprehensive metabolic panel, Vitamin D (25 hydroxy)  Orders as above. Push fluids, routine exercise encouraged, heat, ice, ibuprofen, Tylenol. This could be multifactorial.  Fibromyalgia is a consideration for the future. Follow-up pending the above. The patient voiced understanding and agreement to the plan.  [place], DO 10/11/17  12:24 PM

## 2017-10-11 NOTE — Patient Instructions (Addendum)
Ibuprofen 400-600 mg (2-3 over the counter strength tabs) every 6 hours as needed for pain.  OK to take Tylenol 1000 mg (2 extra strength tabs) or 975 mg (3 regular strength tabs) every 6 hours as needed.  Heat (pad or rice pillow in microwave) over affected area, 10-15 minutes twice daily.   Ice/cold pack over area for 10-15 min twice daily.  Make sure you find a good mattress. Make Sonia Side get you a Tempurpedic or some similar memory foam.    Yoga- Check out Youtube for videos.   Let us know if you need anything.

## 2017-10-17 ENCOUNTER — Encounter: Payer: Self-pay | Admitting: Obstetrics & Gynecology

## 2017-10-17 ENCOUNTER — Ambulatory Visit (INDEPENDENT_AMBULATORY_CARE_PROVIDER_SITE_OTHER): Payer: 59 | Admitting: Obstetrics & Gynecology

## 2017-10-17 VITALS — BP 134/89 | HR 100 | Ht 63.0 in | Wt 232.0 lb

## 2017-10-17 DIAGNOSIS — Z3202 Encounter for pregnancy test, result negative: Secondary | ICD-10-CM

## 2017-10-17 DIAGNOSIS — N914 Secondary oligomenorrhea: Secondary | ICD-10-CM

## 2017-10-17 LAB — POCT URINE PREGNANCY: Preg Test, Ur: NEGATIVE

## 2017-10-17 NOTE — Progress Notes (Signed)
Patient states that she missed period in March and then started having menstraul cramps two days ago. Negative pregnancy test last week and negative HCG last week.  Kathrene Alu RNBSN

## 2017-10-17 NOTE — Progress Notes (Signed)
History:  27 y.o. G1P1001 here today for eval of oligomenorrhea. LMP 09/03/2017.  Pt was on Depo Provera x 1 dose. Last dose was 02/2017.  Prev pregnancy 7 years prev. Pt now married and wants to conceive. No contraception since Depo provera.  Pt has always had irreg cycles. Prior to Depo pt was having cycles q 20-24 days. Pt definitely has one monthly but, she cannot pinpoint the day.  Pts husband has a 38 yo daughter.   Pt was not aware initially that she was pregnant with her daughter due to irreg cycles.   The following portions of the patient's history were reviewed and updated as appropriate: allergies, current medications, past family history, past medical history, past social history, past surgical history and problem list.  Review of Systems:  Pertinent items are noted in HPI.    Objective:  Physical Exam Blood pressure 134/89, pulse 100, height 5\' 3"  (1.6 m), weight 232 lb (105.2 kg), last menstrual period 09/03/2017.  CONSTITUTIONAL: Well-developed, well-nourished female in no acute distress.  HENT:  Normocephalic, atraumatic EYES: Conjunctivae and EOM are normal. No scleral icterus.  NECK: Normal range of motion; black velvety lines horizontally across the back of the neck SKIN: Skin is warm and dry. No rash noted. Not diaphoretic.No pallor. [place]: Alert and oriented to person, place, and time. Normal coordination.   Labs and Imaging 9d ago 3wk ago 55mo ago    hCG, Beta Chain, Quant, S mIU/mL <2  <2 CM <2 CM     Assessment & Plan:  Oligomenorrhea. I suspect that pt actually has PCOS however, since she was prev on Depo provera her abnormal menses is probably exacerbated by this fact. She does have a h/o irreg cycles and does have Acanthosis nigricans.   D/w pt the above rec f/u 35months Menstrual calendar to be brought in at next visit.  Total face-to-face time with patient was 20 min.  Greater than 50% was spent in counseling and coordination of care with the patient.      Pinchos Topel L. Harraway-Smith, M.D., Cherlynn June

## 2017-10-17 NOTE — Patient Instructions (Signed)
Polycystic Ovarian Syndrome °Polycystic ovarian syndrome (PCOS) is a common hormonal disorder among women of reproductive age. In most women with PCOS, many small fluid-filled sacs (cysts) grow on the ovaries, and the cysts are not part of a normal menstrual cycle. PCOS can cause problems with your menstrual periods and make it difficult to get pregnant. It can also cause an increased risk of miscarriage with pregnancy. If it is not treated, PCOS can lead to serious health problems, such as diabetes and heart disease. °What are the causes? °The cause of PCOS is not known, but it may be the result of a combination of certain factors, such as: °· Irregular menstrual cycle. °· High levels of certain hormones (androgens). °· Problems with the hormone that helps to control blood sugar (insulin resistance). °· Certain genes. ° °What increases the risk? °This condition is more likely to develop in women who have a family history of PCOS. °What are the signs or symptoms? °Symptoms of PCOS may include: °· Multiple ovarian cysts. °· Infrequent periods or no periods. °· Periods that are too frequent or too heavy. °· Unpredictable periods. °· Inability to get pregnant (infertility) because of not ovulating. °· Increased growth of hair on the face, chest, stomach, back, thumbs, thighs, or toes. °· Acne or oily skin. Acne may develop during adulthood, and it may not respond to treatment. °· Pelvic pain. °· Weight gain or obesity. °· Patches of thickened and dark brown or black skin on the neck, arms, breasts, or thighs (acanthosis nigricans). °· Excess hair growth on the face, chest, abdomen, or upper thighs (hirsutism). ° °How is this diagnosed? °This condition is diagnosed based on: °· Your medical history. °· A physical exam, including a pelvic exam. Your health care provider may look for areas of increased hair growth on your skin. °· Tests, such as: °? Ultrasound. This may be used to examine the ovaries and the lining of the  uterus (endometrium) for cysts. °? Blood tests. These may be used to check levels of sugar (glucose), female hormone (testosterone), and female hormones (estrogen and progesterone) in your blood. ° °How is this treated? °There is no cure for PCOS, but treatment can help to manage symptoms and prevent more health problems from developing. Treatment varies depending on: °· Your symptoms. °· Whether you want to have a baby or whether you need birth control (contraception). ° °Treatment may include nutrition and lifestyle changes along with: °· Progesterone hormone to start a menstrual period. °· Birth control pills to help you have regular menstrual periods. °· Medicines to make you ovulate, if you want to get pregnant. °· Medicine to reduce excessive hair growth. °· Surgery, in severe cases. This may involve making small holes in one or both of your ovaries. This decreases the amount of testosterone that your body produces. ° °Follow these instructions at home: °· Take over-the-counter and prescription medicines only as told by your health care provider. °· Follow a healthy meal plan. This can help you reduce the effects of PCOS. °? Eat a healthy diet that includes lean proteins, complex carbohydrates, fresh fruits and vegetables, low-fat dairy products, and healthy fats. Make sure to eat enough fiber. °· If you are overweight, lose weight as told by your health care provider. °? Losing 10% of your body weight may improve symptoms. °? Your health care provider can determine how much weight loss is best for you and can help you lose weight safely. °· Keep all follow-up visits as told by   your health care provider. This is important. °Contact a health care provider if: °· Your symptoms do not get better with medicine. °· You develop new symptoms. °This information is not intended to replace advice given to you by your health care provider. Make sure you discuss any questions you have with your health care  provider. °Document Released: 10/27/2004 Document Revised: 02/29/2016 Document Reviewed: 12/19/2015 °Elsevier Interactive Patient Education © 2018 [employer] ° °

## 2017-10-26 ENCOUNTER — Ambulatory Visit (INDEPENDENT_AMBULATORY_CARE_PROVIDER_SITE_OTHER): Payer: 59 | Admitting: Psychology

## 2017-10-26 DIAGNOSIS — F4323 Adjustment disorder with mixed anxiety and depressed mood: Secondary | ICD-10-CM | POA: Diagnosis not present

## 2017-10-30 ENCOUNTER — Other Ambulatory Visit: Payer: Self-pay

## 2017-10-30 MED ORDER — VALACYCLOVIR HCL 1 G PO TABS: 1000.0000 mg | ORAL_TABLET | Freq: Every day | ORAL | 1 refills | Status: DC

## 2017-11-12 ENCOUNTER — Ambulatory Visit (INDEPENDENT_AMBULATORY_CARE_PROVIDER_SITE_OTHER): Payer: 59 | Admitting: Family Medicine

## 2017-11-12 ENCOUNTER — Telehealth: Payer: Self-pay | Admitting: Family Medicine

## 2017-11-12 ENCOUNTER — Encounter: Payer: Self-pay | Admitting: Family Medicine

## 2017-11-12 VITALS — BP 118/76 | HR 82 | Temp 98.3°F | Ht 63.0 in | Wt 243.1 lb

## 2017-11-12 DIAGNOSIS — G43109 Migraine with aura, not intractable, without status migrainosus: Secondary | ICD-10-CM

## 2017-11-12 DIAGNOSIS — M542 Cervicalgia: Secondary | ICD-10-CM | POA: Diagnosis not present

## 2017-11-12 DIAGNOSIS — J301 Allergic rhinitis due to pollen: Secondary | ICD-10-CM | POA: Diagnosis not present

## 2017-11-12 MED ORDER — LEVOCETIRIZINE DIHYDROCHLORIDE 5 MG PO TABS: 5.0000 mg | ORAL_TABLET | Freq: Every evening | ORAL | 2 refills | Status: DC

## 2017-11-12 MED ORDER — METHYLPREDNISOLONE ACETATE 80 MG/ML IJ SUSP
80.0000 mg | Freq: Once | INTRAMUSCULAR | Status: AC
  Administered 2017-11-12: 80 mg via INTRAMUSCULAR

## 2017-11-12 MED ORDER — RIZATRIPTAN BENZOATE 5 MG PO TBDP: 5.0000 mg | ORAL_TABLET | ORAL | 2 refills | Status: DC | PRN

## 2017-11-12 MED FILL — LEVOCETIRIZINE 5 MG TABLET: 5 | 30 days supply | Qty: 30 | Fill #0

## 2017-11-12 MED FILL — RIZATRIPTAN 5 MG ODT: 5 | 30 days supply | Qty: 10 | Fill #0

## 2017-11-12 NOTE — Patient Instructions (Signed)
Heat (pad or rice pillow in microwave) over affected area, 10-15 minutes twice daily.   Have your husband apply pressure over that area on your neck.  Continue Xyzal and fluticasone nasal spray. The shot today should help calm things down a bit.   Mind the gum chewing.   Maxalt should only be used with your migraine type headaches.   EXERCISES RANGE OF MOTION (ROM) AND STRETCHING EXERCISES  These exercises may help you when beginning to rehabilitate your issue. In order to successfully resolve your symptoms, you must improve your posture. These exercises are designed to help reduce the forward-head and rounded-shoulder posture which contributes to this condition. Your symptoms may resolve with or without further involvement from your physician, physical therapist or athletic trainer. While completing these exercises, remember:   Restoring tissue flexibility helps normal motion to return to the joints. This allows healthier, less painful movement and activity.  An effective stretch should be held for at least 20 seconds, although you may need to begin with shorter hold times for comfort.  A stretch should never be painful. You should only feel a gentle lengthening or release in the stretched tissue.  Do not do any stretch or exercise that you cannot tolerate.  STRETCH- Axial Extensors  Lie on your back on the floor. You may bend your knees for comfort. Place a rolled-up hand towel or dish towel, about 2 inches in diameter, under the part of your head that makes contact with the floor.  Gently tuck your chin, as if trying to make a "double chin," until you feel a gentle stretch at the base of your head.  Hold 15-20 seconds. Repeat 2-3 times. Complete this exercise 1 time per day.   STRETCH - Axial Extension   Stand or sit on a firm surface. Assume a good posture: chest up, shoulders drawn back, abdominal muscles slightly tense, knees unlocked (if standing) and feet hip width  apart.  Slowly retract your chin so your head slides back and your chin slightly lowers. Continue to look straight ahead.  You should feel a gentle stretch in the back of your head. Be certain not to feel an aggressive stretch since this can cause headaches later.  Hold for 15-20 seconds. Repeat 2-3 times. Complete this exercise 1 time per day.  STRETCH - Cervical Side Bend   Stand or sit on a firm surface. Assume a good posture: chest up, shoulders drawn back, abdominal muscles slightly tense, knees unlocked (if standing) and feet hip width apart.  Without letting your nose or shoulders move, slowly tip your right / left ear to your shoulder until your feel a gentle stretch in the muscles on the opposite side of your neck.  Hold 15-20 seconds. Repeat 2-3 times. Complete this exercise 1-2 times per day.  STRETCH - Cervical Rotators   Stand or sit on a firm surface. Assume a good posture: chest up, shoulders drawn back, abdominal muscles slightly tense, knees unlocked (if standing) and feet hip width apart.  Keeping your eyes level with the ground, slowly turn your head until you feel a gentle stretch along the back and opposite side of your neck.  Hold 15-20 seconds. Repeat 2-3 times. Complete this exercise 1-2 times per day.  RANGE OF MOTION - Neck Circles   Stand or sit on a firm surface. Assume a good posture: chest up, shoulders drawn back, abdominal muscles slightly tense, knees unlocked (if standing) and feet hip width apart.  Gently roll your head  down and around from the back of one shoulder to the back of the other. The motion should never be forced or painful.  Repeat the motion 10-20 times, or until you feel the neck muscles relax and loosen. Repeat 2-3 times. Complete the exercise 1-2 times per day. STRENGTHENING EXERCISES - Cervical Strain and Sprain These exercises may help you when beginning to rehabilitate your injury. They may resolve your symptoms with or without  further involvement from your physician, physical therapist, or athletic trainer. While completing these exercises, remember:   Muscles can gain both the endurance and the strength needed for everyday activities through controlled exercises.  Complete these exercises as instructed by your physician, physical therapist, or athletic trainer. Progress the resistance and repetitions only as guided.  You may experience muscle soreness or fatigue, but the pain or discomfort you are trying to eliminate should never worsen during these exercises. If this pain does worsen, stop and make certain you are following the directions exactly. If the pain is still present after adjustments, discontinue the exercise until you can discuss the trouble with your clinician.  STRENGTH - Cervical Flexors, Isometric  Face a wall, standing about 6 inches away. Place a small pillow, a ball about 6-8 inches in diameter, or a folded towel between your forehead and the wall.  Slightly tuck your chin and gently push your forehead into the soft object. Push only with mild to moderate intensity, building up tension gradually. Keep your jaw and forehead relaxed.  Hold 10 to 20 seconds. Keep your breathing relaxed.  Release the tension slowly. Relax your neck muscles completely before you start the next repetition. Repeat 2-3 times. Complete this exercise 1 time per day.  STRENGTH- Cervical Lateral Flexors, Isometric   Stand about 6 inches away from a wall. Place a small pillow, a ball about 6-8 inches in diameter, or a folded towel between the side of your head and the wall.  Slightly tuck your chin and gently tilt your head into the soft object. Push only with mild to moderate intensity, building up tension gradually. Keep your jaw and forehead relaxed.  Hold 10 to 20 seconds. Keep your breathing relaxed.  Release the tension slowly. Relax your neck muscles completely before you start the next repetition. Repeat 2-3  times. Complete this exercise 1 time per day.  STRENGTH - Cervical Extensors, Isometric   Stand about 6 inches away from a wall. Place a small pillow, a ball about 6-8 inches in diameter, or a folded towel between the back of your head and the wall.  Slightly tuck your chin and gently tilt your head back into the soft object. Push only with mild to moderate intensity, building up tension gradually. Keep your jaw and forehead relaxed.  Hold 10 to 20 seconds. Keep your breathing relaxed.  Release the tension slowly. Relax your neck muscles completely before you start the next repetition. Repeat 2-3 times. Complete this exercise 1 time per day.  POSTURE AND BODY MECHANICS CONSIDERATIONS Keeping correct posture when sitting, standing or completing your activities will reduce the stress put on different body tissues, allowing injured tissues a chance to heal and limiting painful experiences. The following are general guidelines for improved posture. Your physician or physical therapist will provide you with any instructions specific to your needs. While reading these guidelines, remember:  The exercises prescribed by your provider will help you have the flexibility and strength to maintain correct postures.  The correct posture provides the optimal environment  for your joints to work. All of your joints have less wear and tear when properly supported by a spine with good posture. This means you will experience a healthier, less painful body.  Correct posture must be practiced with all of your activities, especially prolonged sitting and standing. Correct posture is as important when doing repetitive low-stress activities (typing) as it is when doing a single heavy-load activity (lifting).  PROLONGED STANDING WHILE SLIGHTLY LEANING FORWARD When completing a task that requires you to lean forward while standing in one place for a long time, place either foot up on a stationary 2- to 4-inch high  object to help maintain the best posture. When both feet are on the ground, the low back tends to lose its slight inward curve. If this curve flattens (or becomes too large), then the back and your other joints will experience too much stress, fatigue more quickly, and can cause pain.   RESTING POSITIONS Consider which positions are most painful for you when choosing a resting position. If you have pain with flexion-based activities (sitting, bending, stooping, squatting), choose a position that allows you to rest in a less flexed posture. You would want to avoid curling into a fetal position on your side. If your pain worsens with extension-based activities (prolonged standing, working overhead), avoid resting in an extended position such as sleeping on your stomach. Most people will find more comfort when they rest with their spine in a more neutral position, neither too rounded nor too arched. Lying on a non-sagging bed on your side with a pillow between your knees, or on your back with a pillow under your knees will often provide some relief. Keep in mind, being in any one position for a prolonged period of time, no matter how correct your posture, can still lead to stiffness.  WALKING Walk with an upright posture. Your ears, shoulders, and hips should all line up. OFFICE WORK When working at a desk, create an environment that supports good, upright posture. Without extra support, muscles fatigue and lead to excessive strain on joints and other tissues.  CHAIR:  A chair should be able to slide under your desk when your back makes contact with the back of the chair. This allows you to work closely.  The chair's height should allow your eyes to be level with the upper part of your monitor and your hands to be slightly lower than your elbows.  Body position: ? Your feet should make contact with the floor. If this is not possible, use a foot rest. ? Keep your ears over your shoulders. This will  reduce stress on your neck and low back.

## 2017-11-12 NOTE — Telephone Encounter (Signed)
Sent Xyzal in as rx.

## 2017-11-12 NOTE — Progress Notes (Signed)
Chief Complaint  Patient presents with  . Allergies    Marylou Flesher here for URI complaints.  Duration: 1 day after being outside Associated symptoms: sinus congestion, rhinorrhea, itchy watery eyes and scratchy throat Denies: sinus pain, ST, SOB, fevers, ear pain/drainage, dental pain Treatment to date: Xyzal, Flonase dose yesterday Sick contacts: No   Hx of migraines. Can usually feel them coming on. Ibuprofen to help, sometimes doesn't work. Gets around 4/mo. Will also have ha's that don't feel like migraines. Has assoc b/l neck pain and chewing gum on R.   ROS:  Const: Denies fevers HEENT: As noted in HPI Lungs: No SOB  Past Medical History:  Diagnosis Date  . Asthma   . HTN (hypertension)    Family History  Problem Relation Age of Onset  . Hypertension Mother   . Heart attack Father 79  . Hypertension Maternal Grandmother   . Diabetes Maternal Grandmother   . Lung cancer Maternal Grandfather   . Diabetes Paternal Grandmother   . Allergies Daughter     BP 118/76 (BP Location: Left Arm, Patient Position: Sitting, Cuff Size: Large)   Pulse 82   Temp 98.3 F (36.8 C) (Oral)   Ht 5\' 3"  (1.6 m)   Wt 243 lb 2 oz (110.3 kg)   SpO2 99%   BMI 43.07 kg/m  General: Awake, alert, appears stated age HEENT: AT, [place], ears patent b/l and TM's neg, nares patent w/o discharge, pharynx pink and without exudates, MMM Neck: No masses or asymmetry Heart: RRR Lungs: CTAB, no accessory muscle use Neuro: DTR's equal and symmetric b/l, no cerebellar signs MSK: 5/5 strength throughout; +TTP over subocc triangle b/l Psych: Age appropriate judgment and insight, normal mood and affect  Allergic rhinitis due to pollen, unspecified seasonality - Plan: methylPREDNISolone acetate (DEPO-MEDROL) injection 80 mg  Migraine with aura and without status migrainosus, not intractable - Plan: rizatriptan (MAXALT-MLT) 5 MG disintegrating tablet  Neck pain  Orders as above. Cont Xyzal and INCS,  this could take a while to set in. Try Maxalt as she did not do well with Imitrex. Does not seem freq enough to start a prophylactic at this time. Heat, stretches/exercises, taught subocc release for neck. Mind gum chewing. F/u 1 mo. Pt voiced understanding and agreement to the plan.  [place], DO 11/12/17 8:07 AM

## 2017-11-12 NOTE — Progress Notes (Signed)
Pre visit review using our clinic review tool, if applicable. No additional management support is needed unless otherwise documented below in the visit note. 

## 2017-11-13 ENCOUNTER — Other Ambulatory Visit: Payer: Self-pay | Admitting: Family Medicine

## 2017-11-13 MED ORDER — MONTELUKAST SODIUM 10 MG PO TABS: 10.0000 mg | ORAL_TABLET | Freq: Every day | ORAL | 0 refills | Status: DC

## 2017-11-13 MED FILL — MONTELUKAST SOD 10 MG TAB: 10 | 30 days supply | Qty: 30 | Fill #0

## 2017-11-16 ENCOUNTER — Ambulatory Visit (INDEPENDENT_AMBULATORY_CARE_PROVIDER_SITE_OTHER): Payer: 59 | Admitting: Psychology

## 2017-11-16 DIAGNOSIS — F4323 Adjustment disorder with mixed anxiety and depressed mood: Secondary | ICD-10-CM

## 2017-11-26 ENCOUNTER — Ambulatory Visit (INDEPENDENT_AMBULATORY_CARE_PROVIDER_SITE_OTHER): Payer: 59 | Admitting: Family Medicine

## 2017-11-26 ENCOUNTER — Encounter: Payer: Self-pay | Admitting: Family Medicine

## 2017-11-26 VITALS — BP 114/80 | HR 69 | Temp 98.3°F | Ht 63.0 in | Wt 243.4 lb

## 2017-11-26 DIAGNOSIS — F419 Anxiety disorder, unspecified: Secondary | ICD-10-CM | POA: Diagnosis not present

## 2017-11-26 DIAGNOSIS — F329 Major depressive disorder, single episode, unspecified: Secondary | ICD-10-CM

## 2017-11-26 DIAGNOSIS — N946 Dysmenorrhea, unspecified: Secondary | ICD-10-CM | POA: Diagnosis not present

## 2017-11-26 DIAGNOSIS — F32A Depression, unspecified: Secondary | ICD-10-CM | POA: Insufficient documentation

## 2017-11-26 MED ORDER — BUPROPION HCL ER (XL) 150 MG PO TB24: 150.0000 mg | ORAL_TABLET | Freq: Every day | ORAL | 2 refills | Status: DC

## 2017-11-26 MED ORDER — NAPROXEN 500 MG PO TABS
500.0000 mg | ORAL_TABLET | Freq: Two times a day (BID) | ORAL | 0 refills | Status: DC
Start: 2017-11-26 — End: 2018-02-05

## 2017-11-26 MED FILL — NAPROXEN 500 MG TABLET: 500 | 30 days supply | Qty: 30 | Fill #0

## 2017-11-26 MED FILL — buPROPion HCL ER (XL) 150 M: 150 | 30 days supply | Qty: 30 | Fill #0

## 2017-11-26 NOTE — Patient Instructions (Addendum)
OK to take Tylenol 1000 mg (2 extra strength tabs) or 975 mg (3 regular strength tabs) every 6 hours as needed.  Take naproxen twice daily that we have called in during your cycles. Do not take it with ibuprofen.  Let me know if the medicine is too expensive.    Let us know if you need anything.

## 2017-11-26 NOTE — Progress Notes (Signed)
Pre visit review using our clinic review tool, if applicable. No additional management support is needed unless otherwise documented below in the visit note. 

## 2017-11-26 NOTE — Progress Notes (Signed)
Chief Complaint  Patient presents with  . Back Pain  . Dysmenorrhea    Subjective: Patient is a 27 y.o. female here for dysmenorrhea.  She started her period 2 d ago and has been having awful cramping like never before. She has tried heating pads and ibuprofen with minimal relief. Bleeding is less concerning than cramping. Her GYN thinks she may have endometriosis. She does want to try to have another child. No injury.  Has been very moody, Prozac decreased her sex drive. Lexapro did the same thing. She feels her depression is bothering her more than anxiety side of things at this time. She has not failed any other therapies other than listed above. She is following with a Social worker.  ROS: Psych: As noted in HPI GU: As noted in HPI  Family History  Problem Relation Age of Onset  . Hypertension Mother   . Heart attack Father 47  . Hypertension Maternal Grandmother   . Diabetes Maternal Grandmother   . Lung cancer Maternal Grandfather   . Diabetes Paternal Grandmother   . Allergies Daughter    Past Medical History:  Diagnosis Date  . Asthma   . HTN (hypertension)    No Known Allergies  Current Outpatient Medications:  .  levocetirizine (XYZAL) 5 MG tablet, Take 1 tablet (5 mg total) by mouth every evening., Disp: 30 tablet, Rfl: 2 .  mometasone (NASONEX) 50 MCG/ACT nasal spray, Place 2 sprays into the nose daily., Disp: 17 g, Rfl: 12 .  rizatriptan (MAXALT-MLT) 5 MG disintegrating tablet, Take 1 tablet (5 mg total) by mouth as needed for migraine. May repeat in 2 hours if needed, Disp: 10 tablet, Rfl: 2 .  valACYclovir (VALTREX) 1000 MG tablet, Take 1 tablet (1,000 mg total) by mouth daily., Disp: 90 tablet, Rfl: 1 .  buPROPion (WELLBUTRIN XL) 150 MG 24 hr tablet, Take 1 tablet (150 mg total) by mouth daily., Disp: 30 tablet, Rfl: 2 .  naproxen (NAPROSYN) 500 MG tablet, Take 1 tablet (500 mg total) by mouth 2 (two) times daily with a meal., Disp: 30 tablet, Rfl: 0 .  Vitamin D,  Ergocalciferol, (DRISDOL) 50000 units CAPS capsule, Take 1 capsule (50,000 Units total) by mouth every 7 (seven) days. (Patient not taking: Reported on 11/26/2017), Disp: 12 capsule, Rfl: 0  Objective: BP 114/80 (BP Location: Left Arm, Patient Position: Sitting, Cuff Size: Large)   Pulse 69   Temp 98.3 F (36.8 C) (Oral)   Ht 5\' 3"  (1.6 m)   Wt 243 lb 6 oz (110.4 kg)   SpO2 99%   BMI 43.11 kg/m  General: Awake, appears stated age HEENT: MMM, EOMi Heart: RRR, no murmurs Lungs: CTAB, no rales, wheezes or rhonchi. No accessory muscle use Psych: Age appropriate judgment and insight, normal affect and mood  Assessment and Plan: Dysmenorrhea - Plan: naproxen (NAPROSYN) 500 MG tablet  Anxiety and depression - Plan: buPROPion (WELLBUTRIN XL) 150 MG 24 hr tablet  Orders as above. Naproxen + Tylenol for pain. If no improvement consider OCP vs discussion with GYN as she does wish to conceive. I did bring up possibility of endometriosis and it sounds like her GYN thinks this is a possibility also? Change SSRI to Wellbutrin. F/u in 6 weeks. The patient voiced understanding and agreement to the plan.  [place], DO 11/26/17  12:03 PM

## 2017-11-29 MED FILL — LIDOCAINE HCL 2% JELLY: 2 | 30 days supply | Qty: 30 | Fill #1

## 2017-11-29 MED FILL — valACYclovir HCL 1 GM TABS: 1 | 90 days supply | Qty: 90 | Fill #0

## 2017-12-06 ENCOUNTER — Other Ambulatory Visit: Payer: Self-pay

## 2017-12-06 DIAGNOSIS — R1011 Right upper quadrant pain: Secondary | ICD-10-CM

## 2017-12-17 ENCOUNTER — Telehealth: Payer: 59 | Admitting: Family

## 2017-12-17 ENCOUNTER — Telehealth: Payer: Self-pay

## 2017-12-17 DIAGNOSIS — R112 Nausea with vomiting, unspecified: Secondary | ICD-10-CM | POA: Diagnosis not present

## 2017-12-17 MED ORDER — ONDANSETRON 4 MG PO TBDP: 4.0000 mg | ORAL_TABLET | Freq: Three times a day (TID) | ORAL | 0 refills | Status: DC | PRN

## 2017-12-17 NOTE — Telephone Encounter (Signed)
Copied from [place] [id number]. Topic: Inquiry >> Dec 17, 2017  2:59 PM Conception Chancy, NT wrote: Reason for CRM: Carolyne Fiscal is calling from centralized scheduling and states that there was a order for a new med study scan and Urban Gibson signed it. She said a MD or PC needs to sign it. Please advise.   Cb# [phone]  Signed before 3pm on 12/18/17

## 2017-12-17 NOTE — Telephone Encounter (Signed)
Will you look and see if she needs hida scan

## 2017-12-17 NOTE — Telephone Encounter (Signed)
Patient no longer wishes for this test. OK to cancel. TY.

## 2017-12-17 NOTE — Progress Notes (Signed)
Thank you for the details you included in the comment boxes. Those details are very helpful in determining the best course of treatment for you and help [place] to provide the best care.  We are sorry that you are not feeling well.  Here is how we plan to help!  Based on what you have shared with me it looks like you have Acute Infectious Diarrhea.  Most cases of acute diarrhea are due to infections with virus and bacteria and are self-limited conditions lasting less than 14 days.  For your symptoms you may take Imodium 2 mg tablets that are over the counter at your local pharmacy. Take two tablet now and then one after each loose stool up to 6 a day.  Antibiotics are not needed for most people with diarrhea.  Optional: Zofran 4 mg 1 tablet every 8 hours as needed for nausea and vomiting   HOME CARE  We recommend changing your diet to help with your symptoms for the next few days.  Drink plenty of fluids that contain water salt and sugar. Sports drinks such as Gatorade may help.   You may try broths, soups, bananas, applesauce, soft breads, mashed potatoes or crackers.   You are considered infectious for as long as the diarrhea continues. Hand washing or use of alcohol based hand sanitizers is recommend.  It is best to stay out of work or school until your symptoms stop.   GET HELP RIGHT AWAY  If you have dark yellow colored urine or do not pass urine frequently you should drink more fluids.    If your symptoms worsen   If you feel like you are going to pass out (faint)  You have a new problem  MAKE SURE YOU   Understand these instructions.  Will watch your condition.  Will get help right away if you are not doing well or get worse.  Your e-visit answers were reviewed by a board certified advanced clinical practitioner to complete your personal care plan.  Depending on the condition, your plan could have included both over the counter or prescription medications.  If there is  a problem please reply  once you have received a response from your provider.  Your safety is important to [place].  If you have drug allergies check your prescription carefully.    You can use MyChart to ask questions about today's visit, request a non-urgent call back, or ask for a work or school excuse for 24 hours related to this e-Visit. If it has been greater than 24 hours you will need to follow up with your provider, or enter a new e-Visit to address those concerns.   You will get an e-mail in the next two days asking about your experience.  I hope that your e-visit has been valuable and will speed your recovery. Thank you for using e-visits.

## 2017-12-19 ENCOUNTER — Other Ambulatory Visit: Payer: Self-pay | Admitting: Family Medicine

## 2017-12-19 ENCOUNTER — Ambulatory Visit (HOSPITAL_COMMUNITY): Payer: 59

## 2017-12-19 ENCOUNTER — Other Ambulatory Visit (INDEPENDENT_AMBULATORY_CARE_PROVIDER_SITE_OTHER): Payer: 59

## 2017-12-19 DIAGNOSIS — Z3200 Encounter for pregnancy test, result unknown: Secondary | ICD-10-CM

## 2017-12-19 DIAGNOSIS — Z3490 Encounter for supervision of normal pregnancy, unspecified, unspecified trimester: Secondary | ICD-10-CM

## 2017-12-20 LAB — HCG, QUANTITATIVE, PREGNANCY: Quantitative HCG: <0.6 m[IU]/mL

## 2017-12-21 ENCOUNTER — Ambulatory Visit (INDEPENDENT_AMBULATORY_CARE_PROVIDER_SITE_OTHER): Payer: 59 | Admitting: Psychology

## 2017-12-21 DIAGNOSIS — F4323 Adjustment disorder with mixed anxiety and depressed mood: Secondary | ICD-10-CM | POA: Diagnosis not present

## 2018-01-03 ENCOUNTER — Other Ambulatory Visit: Payer: Self-pay | Admitting: Family Medicine

## 2018-01-03 ENCOUNTER — Other Ambulatory Visit (INDEPENDENT_AMBULATORY_CARE_PROVIDER_SITE_OTHER): Payer: 59

## 2018-01-03 DIAGNOSIS — B009 Herpesviral infection, unspecified: Secondary | ICD-10-CM

## 2018-01-04 ENCOUNTER — Ambulatory Visit (INDEPENDENT_AMBULATORY_CARE_PROVIDER_SITE_OTHER): Payer: 59 | Admitting: Psychology

## 2018-01-04 DIAGNOSIS — F4323 Adjustment disorder with mixed anxiety and depressed mood: Secondary | ICD-10-CM | POA: Diagnosis not present

## 2018-01-09 LAB — HSV 1/2 AB (IGM), IFA W/RFLX TITER
HSV 1 IGM SCREEN: NEGATIVE
HSV 2 IGM SCREEN: NEGATIVE

## 2018-01-09 LAB — HSV(HERPES SIMPLEX VRS) I + II AB-IGG
HAV 1 IGG,TYPE SPECIFIC AB: <0.9 index
HSV 2 IGG,TYPE SPECIFIC AB: 8.36 index — ABNORMAL HIGH

## 2018-01-14 ENCOUNTER — Encounter: Payer: Self-pay | Admitting: Obstetrics & Gynecology

## 2018-01-14 ENCOUNTER — Ambulatory Visit (INDEPENDENT_AMBULATORY_CARE_PROVIDER_SITE_OTHER): Payer: 59 | Admitting: Obstetrics & Gynecology

## 2018-01-14 VITALS — BP 108/66 | HR 75 | Resp 16 | Wt 244.1 lb

## 2018-01-14 DIAGNOSIS — N914 Secondary oligomenorrhea: Secondary | ICD-10-CM | POA: Diagnosis not present

## 2018-01-14 DIAGNOSIS — A6000 Herpesviral infection of urogenital system, unspecified: Secondary | ICD-10-CM | POA: Diagnosis not present

## 2018-01-14 MED ORDER — VALACYCLOVIR HCL 500 MG PO TABS: 500.0000 mg | ORAL_TABLET | Freq: Every day | ORAL | 12 refills | Status: DC

## 2018-01-14 MED FILL — VALACYCLOVIR HCL 500 MG TAB: 500 | 15 days supply | Qty: 30 | Fill #0

## 2018-01-14 NOTE — Progress Notes (Signed)
History:  27 y.o. G1P0 here today for eval of oligomenorrhea. Pt was prev seen for oligomenorrhea however, this was though to be due to her prev Depo Provera. Pt was asked to keep a calendar of her cycles. She is here for f/u. Since her last visit, she had reg cycles in May and Jun ~ 28 days apart. They wre 5-7 days in duration. They were a bit heavier than her prev cycles but, otherwise WNL.   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 She would like to be on suppression.    The following portions of the patient's history were reviewed and updated as appropriate: allergies, current medications, past family history, past medical history, past social history, past surgical history and problem list.  Review of Systems:  Pertinent items are noted in HPI.    Objective:  Physical Exam Blood pressure 108/66, pulse 75, resp. rate 16, weight 244 lb 1.3 oz (110.7 kg), last menstrual period 12/19/2017.  CONSTITUTIONAL: Well-developed, well-nourished female in no acute distress.  HENT:  Normocephalic, atraumatic EYES: Conjunctivae and EOM are normal. No scleral icterus.  NECK: Normal range of motion SKIN: Skin is warm and dry. No rash noted. Not diaphoretic.No pallor. [place]: Alert and oriented to person, place, and time. Normal coordination.   Labs and Imaging No results found.  Assessment & Plan:  Oligomenorrhea- improved at present. Pt declines contraception as she wants to try to conceive.   PNV 1 po q day  We discussed timing of intercourse for conception. All questions answered   h/o hsv with recurrent outbreaks. Pt desired suppression  Valtrex 500mg  daily  Total face-to-face time with patient was15 min.  Greater than 50% was spent in counseling and coordination of care with the patient.   Liddy Deam L. Harraway-Smith, M.D., Cherlynn June

## 2018-01-14 NOTE — Patient Instructions (Signed)
Preparing for Pregnancy If you are considering becoming pregnant, make an appointment to see your regular health care provider to learn how to prepare for a safe and healthy pregnancy (preconception care). During a preconception care visit, your health care provider will:  Do a complete physical exam, including a Pap test.  Take a complete medical history.  Give you information, answer your questions, and help you resolve problems.  Preconception checklist Medical history  Tell your health care provider about any current or past medical conditions. Your pregnancy or your ability to become pregnant may be affected by chronic conditions, such as diabetes, chronic hypertension, and thyroid problems.  Include your family's medical history as well as your partner's medical history.  Tell your health care provider about any history of STIs (sexually transmitted infections).These can affect your pregnancy. In some cases, they can be passed to your baby. Discuss any concerns that you have about STIs.  If indicated, discuss the benefits of genetic testing. This testing will show whether there are any genetic conditions that may be passed from you or your partner to your baby.  Tell your health care provider about: ? Any problems you have had with conception or pregnancy. ? Any medicines you take. These include vitamins, herbal supplements, and over-the-counter medicines. ? Your history of immunizations. Discuss any vaccinations that you may need.  Diet  Ask your health care provider what to include in a healthy diet that has a balance of nutrients. This is especially important when you are pregnant or preparing to become pregnant.  Ask your health care provider to help you reach a healthy weight before pregnancy. ? If you are overweight, you may be at higher risk for certain complications, such as high blood pressure, diabetes, and preterm birth. ? If you are underweight, you are more likely  to have a baby who has a low birth weight.  Lifestyle, work, and home  Let your health care provider know: ? About any lifestyle habits that you have, such as alcohol use, drug use, or smoking. ? About recreational activities that may put you at risk during pregnancy, such as downhill skiing and certain exercise programs. ? Tell your health care provider about any international travel, especially any travel to places with an active Zika virus outbreak. ? About harmful substances that you may be exposed to at work or at home. These include chemicals, pesticides, radiation, or even litter boxes. ? If you do not feel safe at home.  Mental health  Tell your health care provider about: ? Any history of mental health conditions, including feelings of depression, sadness, or anxiety. ? Any medicines that you take for a mental health condition. These include herbs and supplements.  Home instructions to prepare for pregnancy Lifestyle  Eat a balanced diet. This includes fresh fruits and vegetables, whole grains, lean meats, low-fat dairy products, healthy fats, and foods that are high in fiber. Ask to meet with a nutritionist or registered dietitian for assistance with meal planning and goals.  Get regular exercise. Try to be active for at least 30 minutes a day on most days of the week. Ask your health care provider which activities are safe during pregnancy.  Do not use any products that contain nicotine or tobacco, such as cigarettes and e-cigarettes. If you need help quitting, ask your health care provider.  Do not drink alcohol.  Do not take illegal drugs.  Maintain a healthy weight. Ask your health care provider what weight range is   right for you.  General instructions  Keep an accurate record of your menstrual periods. This makes it easier for your health care provider to determine your baby's due date.  Begin taking prenatal vitamins and folic acid supplements daily as directed by  your health care provider.  Manage any chronic conditions, such as high blood pressure and diabetes, as told by your health care provider. This is important.  How do I know that I am pregnant? You may be pregnant if you have been sexually active and you miss your period. Symptoms of early pregnancy include:  Mild cramping.  Very light vaginal bleeding (spotting).  Feeling unusually tired.  Nausea and vomiting (morning sickness).  If you have any of these symptoms and you suspect that you might be pregnant, you can take a home pregnancy test. These tests check for a hormone in your urine (human chorionic gonadotropin, or hCG). A woman's body begins to make this hormone during early pregnancy. These tests are very accurate. Wait until at least the first day after you miss your period to take one. If the test shows that you are pregnant (you get a positive result), call your health care provider to make an appointment for prenatal care. What should I do if I become pregnant?  Make an appointment with your health care provider as soon as you suspect you are pregnant.  Do not use any products that contain nicotine, such as cigarettes, chewing tobacco, and e-cigarettes. If you need help quitting, ask your health care provider.  Do not drink alcoholic beverages. Alcohol is related to a number of birth defects.  Avoid toxic odors and chemicals.  You may continue to have sexual intercourse if it does not cause pain or other problems, such as vaginal bleeding. This information is not intended to replace advice given to you by your health care provider. Make sure you discuss any questions you have with your health care provider. Document Released: 06/15/2008 Document Revised: 02/29/2016 Document Reviewed: 01/23/2016 Elsevier Interactive Patient Education  2018 [employer]  

## 2018-01-19 ENCOUNTER — Other Ambulatory Visit: Payer: Self-pay

## 2018-01-19 ENCOUNTER — Emergency Department (HOSPITAL_BASED_OUTPATIENT_CLINIC_OR_DEPARTMENT_OTHER)
Admission: EM | Admit: 2018-01-19 | Discharge: 2018-01-19 | Disposition: A | Discharge status: Home / Self Care | Payer: 59 | Attending: Emergency Medicine | Admitting: Emergency Medicine

## 2018-01-19 ENCOUNTER — Encounter (HOSPITAL_BASED_OUTPATIENT_CLINIC_OR_DEPARTMENT_OTHER): Payer: Self-pay | Admitting: Emergency Medicine

## 2018-01-19 DIAGNOSIS — J45909 Unspecified asthma, uncomplicated: Secondary | ICD-10-CM | POA: Insufficient documentation

## 2018-01-19 DIAGNOSIS — I10 Essential (primary) hypertension: Secondary | ICD-10-CM | POA: Insufficient documentation

## 2018-01-19 DIAGNOSIS — Z79899 Other long term (current) drug therapy: Secondary | ICD-10-CM | POA: Diagnosis not present

## 2018-01-19 DIAGNOSIS — R51 Headache: Secondary | ICD-10-CM | POA: Diagnosis present

## 2018-01-19 DIAGNOSIS — G43809 Other migraine, not intractable, without status migrainosus: Secondary | ICD-10-CM | POA: Insufficient documentation

## 2018-01-19 HISTORY — DX: Migraine, unspecified, not intractable, without status migrainosus: G43.909

## 2018-01-19 MED ORDER — METOCLOPRAMIDE HCL 5 MG/ML IJ SOLN
10.0000 mg | Freq: Once | INTRAMUSCULAR | Status: AC
  Administered 2018-01-19: 10 mg via INTRAVENOUS
  Filled 2018-01-19: qty 2

## 2018-01-19 MED ORDER — KETOROLAC TROMETHAMINE 30 MG/ML IJ SOLN
15.0000 mg | Freq: Once | INTRAMUSCULAR | Status: AC
  Administered 2018-01-19: 15 mg via INTRAVENOUS
  Filled 2018-01-19: qty 1

## 2018-01-19 MED ORDER — DEXAMETHASONE SODIUM PHOSPHATE 10 MG/ML IJ SOLN
10.0000 mg | Freq: Once | INTRAMUSCULAR | Status: AC
  Administered 2018-01-19: 10 mg via INTRAVENOUS
  Filled 2018-01-19: qty 1

## 2018-01-19 NOTE — ED Triage Notes (Signed)
Headache with nausea x 4 hours.

## 2018-01-19 NOTE — ED Provider Notes (Signed)
[facility] Provider Note   CSN: [id number] Arrival date & time: 01/19/18  1609     History   Chief Complaint Chief Complaint  Patient presents with  . Headache    HPI Danielle Garcia is a 27 y.o. female with history of hypertension, asthma, migraines who presents with a headache that began this morning.  She reports a gradually worsened and has been worse over the past 4 hours.  It is typical of her normal, but a little worse.  She reports her whole head hurts.  She denies any vision changes, numbness or tingling, but has had nausea but no vomiting.  She denies any chest pain, shortness of breath, abdominal pain.  She took ibuprofen 600 mg about an hour PTA.  HPI  Past Medical History:  Diagnosis Date  . Asthma   . HTN (hypertension)   . Migraine     Patient Active Problem List   Diagnosis Date Noted  . Anxiety and depression 11/26/2017  . 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 CESAREAN SECTION       OB History    Gravida  1   Para      Term      Preterm      AB      Living        SAB      TAB      Ectopic      Multiple      Live Births  1            Home Medications    Prior to Admission medications   Medication Sig Start Date End Date Taking? Authorizing Provider  buPROPion (WELLBUTRIN XL) 150 MG 24 hr tablet Take 1 tablet (150 mg total) by mouth daily. 11/26/17  Yes [name], DO  levocetirizine (XYZAL) 5 MG tablet Take 1 tablet (5 mg total) by mouth every evening. 11/12/17  Yes [name], DO  lidocaine (XYLOCAINE) 2 % jelly Apply 1 application topically as needed. 11/29/17  Yes [provider]  mometasone (NASONEX) 50 MCG/ACT nasal spray Place 2 sprays into the nose daily. 09/19/17  Yes [name], DO  valACYclovir (VALTREX) 1000 MG tablet Take 1 tablet (1,000 mg total) by mouth daily. 10/30/17   Yes Mosie Lukes, MD  naproxen (NAPROSYN) 500 MG tablet Take 1 tablet (500 mg total) by mouth 2 (two) times daily with a meal. 11/26/17   Wendling, Crosby Oyster, DO  valACYclovir (VALTREX) 500 MG tablet Take 1 tablet (500 mg total) by mouth daily. Can increase to twice a day for 5 days in the event of a recurrence 01/14/18   Lavonia Drafts, MD    Family History Family History  Problem Relation Age of Onset  . Hypertension Mother   . Heart attack Father 69  . Hypertension Maternal Grandmother   . Diabetes Maternal Grandmother   . Lung cancer Maternal Grandfather   . Diabetes Paternal Grandmother   . Allergies Daughter     Social History Social History   Tobacco Use  . Smoking status: Never Smoker  . Smokeless tobacco: Never Used  Substance Use Topics  . Alcohol use: No  . Drug use: No     Allergies   Patient has no known allergies.   Review of Systems Review of Systems  Constitutional: Negative for chills and fever.  HENT: Negative for facial swelling  and sore throat.   Respiratory: Negative for shortness of breath.   Cardiovascular: Negative for chest pain.  Gastrointestinal: Positive for nausea. Negative for abdominal pain and vomiting.  Genitourinary: Negative for dysuria.  Musculoskeletal: Negative for back pain, neck pain and neck stiffness.  Skin: Negative for rash and wound.  Neurological: Positive for headaches. Negative for dizziness and light-headedness.  Psychiatric/Behavioral: The patient is not nervous/anxious.      Physical Exam Updated Vital Signs BP (!) 135/91 (BP Location: Left Arm)   Pulse 74   Temp 98.1 F (36.7 C) (Oral)   Resp 16   Ht 5\' 3"  (1.6 m)   Wt 110.7 kg (244 lb)   SpO2 100%   BMI 43.22 kg/m   Physical Exam  Constitutional: She appears well-developed and well-nourished. No distress.  HENT:  Head: Normocephalic and atraumatic.  Mouth/Throat: Oropharynx is clear and moist. No oropharyngeal exudate.  Eyes: Pupils  are equal, round, and reactive to light. Conjunctivae and EOM are normal. Right eye exhibits no discharge. Left eye exhibits no discharge. No scleral icterus.  Neck: Normal range of motion. Neck supple. No neck rigidity. No thyromegaly present.  Chin to chest without difficulty  Cardiovascular: Normal rate, regular rhythm, normal heart sounds and intact distal pulses. Exam reveals no gallop and no friction rub.  No murmur heard. Pulmonary/Chest: Effort normal and breath sounds normal. No stridor. No respiratory distress. She has no wheezes. She has no rales.  Abdominal: Soft. Bowel sounds are normal. She exhibits no distension. There is no tenderness. There is no rebound and no guarding.  Musculoskeletal: She exhibits no edema.  Lymphadenopathy:    She has no cervical adenopathy.  Neurological: She is alert. Coordination normal. GCS eye subscore is 4. GCS verbal subscore is 5. GCS motor subscore is 6.  CN 3-12 intact; normal sensation throughout; 5/5 strength in all 4 extremities; equal bilateral grip strength; no ataxia on finger-to-nose  Skin: Skin is warm and dry. No rash noted. She is not diaphoretic. No pallor.  Psychiatric: She has a normal mood and affect.  Nursing note and vitals reviewed.    ED Treatments / Results  Labs (all labs ordered are listed, but only abnormal results are displayed) Labs Reviewed - No data to display  EKG None  Radiology No results found.  Procedures Procedures (including critical care time)  Medications Ordered in ED Medications  ketorolac (TORADOL) 30 MG/ML injection 15 mg (15 mg Intravenous Given 01/19/18 1644)  metoCLOPramide (REGLAN) injection 10 mg (10 mg Intravenous Given 01/19/18 1645)  dexamethasone (DECADRON) injection 10 mg (10 mg Intravenous Given 01/19/18 1646)     Initial Impression / Assessment and Plan / ED Course  I have reviewed the triage vital signs and the nursing notes.  Pertinent labs & imaging results that were available  during my care of the patient were reviewed by me and considered in my medical decision making (see chart for details).     Pt HA treated and improved while in ED.  Presentation is like pts typical HA and non concerning for [facility], ICH, Meningitis, or temporal arteritis. Pt is afebrile with no focal neuro deficits, nuchal rigidity, or change in vision. Pt is to follow up with PCP or neurology to discuss prophylactic medication.  Patient understands and agrees with plan.  Patient vitals stable throughout ED course and discharged in satisfactory condition.  Pt verbalizes understanding and is agreeable with plan to dc.    Final Clinical Impressions(s) / ED Diagnoses  Final diagnoses:  Other migraine without status migrainosus, not intractable    ED Discharge Orders    None       Frederica Kuster, PA-C 01/19/18 1718    Quintella Reichert, MD 01/20/18 [phone]

## 2018-01-19 NOTE — Discharge Instructions (Addendum)
Please talk to your doctor about your increasing migraines lately.  You may want to follow-up with a neurologist.  Try to keep a journal of situations surrounding your headaches such as things you are eating/drinking in the weather outside.  Please return to the emergency department if you develop any new or worsening symptoms.

## 2018-01-19 NOTE — ED Notes (Signed)
ED Provider at bedside. 

## 2018-01-21 ENCOUNTER — Encounter: Payer: Self-pay | Admitting: Family Medicine

## 2018-01-21 MED ORDER — PROPRANOLOL HCL ER 60 MG PO CP24: 60.0000 mg | ORAL_CAPSULE | Freq: Every day | ORAL | 2 refills | Status: DC

## 2018-01-21 MED FILL — PROPRANOLOL ER 60 MG CAP: 60 | 30 days supply | Qty: 30 | Fill #0

## 2018-01-21 MED FILL — buPROPion HCL ER (XL) 150 M: 150 | 30 days supply | Qty: 30 | Fill #1

## 2018-01-21 NOTE — Telephone Encounter (Signed)
Propranolol sent in for migraine prophylaxis. Pt notified.

## 2018-01-22 ENCOUNTER — Other Ambulatory Visit: Payer: Self-pay | Admitting: Family Medicine

## 2018-01-22 DIAGNOSIS — G43109 Migraine with aura, not intractable, without status migrainosus: Secondary | ICD-10-CM

## 2018-01-22 MED ORDER — RIZATRIPTAN BENZOATE 5 MG PO TBDP: 5.0000 mg | ORAL_TABLET | ORAL | 2 refills | Status: DC | PRN

## 2018-01-22 MED FILL — RIZATRIPTAN 5 MG ODT: 5 | 30 days supply | Qty: 10 | Fill #0

## 2018-01-24 ENCOUNTER — Telehealth: Payer: Self-pay | Admitting: *Deleted

## 2018-01-24 NOTE — Telephone Encounter (Signed)
Received FMLA/STD paperwork from [employer]; completed as much as possible; forwarded to provider/SLS 07/11

## 2018-01-29 ENCOUNTER — Ambulatory Visit: Payer: 59 | Admitting: Psychology

## 2018-01-31 ENCOUNTER — Encounter (INDEPENDENT_AMBULATORY_CARE_PROVIDER_SITE_OTHER): Payer: Medicaid Other

## 2018-01-31 ENCOUNTER — Other Ambulatory Visit: Payer: 59

## 2018-01-31 ENCOUNTER — Other Ambulatory Visit: Payer: Self-pay | Admitting: Family Medicine

## 2018-01-31 DIAGNOSIS — J029 Acute pharyngitis, unspecified: Secondary | ICD-10-CM

## 2018-01-31 NOTE — Progress Notes (Signed)
Patient c/o sore throat orders placed per Dr. [name]

## 2018-02-01 ENCOUNTER — Other Ambulatory Visit: Payer: Self-pay | Admitting: Family Medicine

## 2018-02-01 MED ORDER — AMOXICILLIN 500 MG PO CAPS: 500.0000 mg | ORAL_CAPSULE | Freq: Two times a day (BID) | ORAL | 0 refills | Status: DC

## 2018-02-01 MED FILL — AMOXICILLIN 500 MG CAPSULE: 500 | 10 days supply | Qty: 20 | Fill #0

## 2018-02-03 LAB — CULTURE, GROUP A STREP
MICRO NUMBER:: 90851995
SPECIMEN QUALITY:: ADEQUATE

## 2018-02-05 ENCOUNTER — Ambulatory Visit (INDEPENDENT_AMBULATORY_CARE_PROVIDER_SITE_OTHER): Payer: 59 | Admitting: Family Medicine

## 2018-02-05 ENCOUNTER — Encounter (INDEPENDENT_AMBULATORY_CARE_PROVIDER_SITE_OTHER): Payer: Self-pay | Admitting: Family Medicine

## 2018-02-05 VITALS — BP 123/79 | HR 63 | Temp 97.7°F | Ht 63.0 in | Wt 243.0 lb

## 2018-02-05 DIAGNOSIS — Z0289 Encounter for other administrative examinations: Secondary | ICD-10-CM

## 2018-02-05 DIAGNOSIS — Z9189 Other specified personal risk factors, not elsewhere classified: Secondary | ICD-10-CM | POA: Diagnosis not present

## 2018-02-05 DIAGNOSIS — Z6841 Body Mass Index (BMI) 40.0 and over, adult: Secondary | ICD-10-CM | POA: Diagnosis not present

## 2018-02-05 DIAGNOSIS — G43809 Other migraine, not intractable, without status migrainosus: Secondary | ICD-10-CM | POA: Diagnosis not present

## 2018-02-05 DIAGNOSIS — R0602 Shortness of breath: Secondary | ICD-10-CM | POA: Insufficient documentation

## 2018-02-05 DIAGNOSIS — I10 Essential (primary) hypertension: Secondary | ICD-10-CM | POA: Diagnosis not present

## 2018-02-05 DIAGNOSIS — G43909 Migraine, unspecified, not intractable, without status migrainosus: Secondary | ICD-10-CM | POA: Insufficient documentation

## 2018-02-05 DIAGNOSIS — R5383 Other fatigue: Secondary | ICD-10-CM | POA: Insufficient documentation

## 2018-02-05 DIAGNOSIS — F418 Other specified anxiety disorders: Secondary | ICD-10-CM | POA: Diagnosis not present

## 2018-02-05 NOTE — Progress Notes (Signed)
Office: [phone]  /  Fax: [phone]   Dear [name],   Thank you for referring Danielle Garcia to our clinic. The following note includes my evaluation and treatment recommendations.  HPI:   Chief Complaint: OBESITY    [name] has been referred by [name], DO for consultation regarding her obesity and obesity related comorbidities.    Danielle Garcia (MR# [id number]) is a 27 y.o. female who presents on 02/05/2018 for obesity evaluation and treatment. Current BMI is Body mass index is 43.05 kg/m.Danielle Garcia has been struggling with her weight for many years and has been unsuccessful in either losing weight, maintaining weight loss, or reaching her healthy weight goal. [name] was previously on phentermine approximately two years ago (lost around 40 lbs).     [place] attended our information session and states she is currently in the action stage of change and ready to dedicate time achieving and maintaining a healthier weight. Danielle Garcia is interested in becoming our patient and working on intensive lifestyle modifications including (but not limited to) diet, exercise and weight loss.    Danielle Garcia states her family eats meals together she thinks her family will eat healthier with  her her desired weight loss is 79 lbs she has been heavy most of  her life she started gaining weight after her baby her heaviest weight ever was 249 lbs. she has significant food cravings issues  she snacks frequently in the evenings she wakes up frequently in the middle of the night to eat she is frequently drinking liquids with calories she frequently makes poor food choices she has problems with excessive hunger  she frequently eats larger portions than normal  she has binge eating behaviors she struggles with emotional eating    Fatigue [name] feels her energy is lower than it should be. This has worsened with weight gain and has not worsened recently. [name] admits to  daytime somnolence and admits to waking up still tired. Patient is at risk for obstructive sleep apnea. Patent has a history of symptoms of daytime fatigue, morning fatigue, morning headache and hypertension. Patient generally gets 4 to 6 hours of sleep per night, and states they generally have restless sleep. Snoring is present. Apneic episodes are not present. Epworth Sleepiness Score is 8  EKG was ordered today and shows normal sinus rhythm (70 BPM) with rate variation.  Dyspnea on exertion [name] notes increasing shortness of breath with exercising and seems to be worsening over time with weight gain. She notes getting out of breath sooner with activity than she used to. This has not gotten worse recently. EKG was ordered today and shows normal sinus rhythm (70 BPM) with rate variation. [name] denies orthopnea.  Migraines [name] has a diagnosis of migraines and was just started on propanolol. She previously had migraines like, every other day.  Hypertension Danielle Garcia is a 27 y.o. female with a previous diagnosis of hypertension. She was previously on Lisinopril. Danielle Garcia denies chest pain. She is working weight loss to help control her blood pressure with the goal of decreasing her risk of heart attack and stroke. Danielle Garcia blood pressure is controlled today.  Depression with anxiety Jaydalyn was just started on Wellbutrin approximately 2 months ago by her PCP. She is seeing a Social worker. She shows no sign of suicidal or homicidal ideations.  At risk for diabetes Danielle Garcia is at higher than average risk for developing diabetes due to her obesity. She currently denies polyuria or polydipsia.  Depression screen [facility] 2/9 02/05/2018  01/23/2017  Decreased Interest 2 3  Down, Depressed, Hopeless 3 2  PHQ - 2 Score 5 5  Altered sleeping 2 3  Tired, decreased energy 3 3  Change in appetite 3 1  Feeling bad or failure about yourself  1 2  Trouble concentrating 2 2  Moving slowly or  fidgety/restless 0 1  Suicidal thoughts 0 0  PHQ-9 Score 16 17  Difficult doing work/chores Very difficult -       Depression Screen Danielle Garcia's Food and Mood (modified PHQ-9) score was  Depression screen PHQ 2/9 02/05/2018  Decreased Interest 2  Down, Depressed, Hopeless 3  PHQ - 2 Score 5  Altered sleeping 2  Tired, decreased energy 3  Change in appetite 3  Feeling bad or failure about yourself  1  Trouble concentrating 2  Moving slowly or fidgety/restless 0  Suicidal thoughts 0  PHQ-9 Score 16  Difficult doing work/chores Very difficult    ALLERGIES: No Known Allergies  MEDICATIONS: Current Outpatient Medications on File Prior to Visit  Medication Sig Dispense Refill  . buPROPion (WELLBUTRIN XL) 150 MG 24 hr tablet Take 1 tablet (150 mg total) by mouth daily. 30 tablet 2  . propranolol ER (INDERAL LA) 60 MG 24 hr capsule Take 1 capsule (60 mg total) by mouth daily. 30 capsule 2   No current facility-administered medications on file prior to visit.     PAST MEDICAL HISTORY: Past Medical History:  Diagnosis Date  . Anxiety   . Asthma   . Back pain   . Depression   . Fatigue   . Floaters   . GERD (gastroesophageal reflux disease)   . HTN (hypertension)   . Migraine   . Neck pain   . Pre-diabetes   . Shortness of breath   . Trouble in sleeping     PAST SURGICAL HISTORY: Past Surgical History:  Procedure Laterality Date  . CESAREAN SECTION      SOCIAL HISTORY: Social History   Tobacco Use  . Smoking status: Never Smoker  . Smokeless tobacco: Never Used  Substance Use Topics  . Alcohol use: No  . Drug use: No    FAMILY HISTORY: Family History  Problem Relation Age of Onset  . Hypertension Mother   . Heart attack Father 13  . Obesity Father   . Hypertension Maternal Grandmother   . Diabetes Maternal Grandmother   . Lung cancer Maternal Grandfather   . Diabetes Paternal Grandmother   . Allergies Daughter     ROS: Review of Systems    Constitutional: Positive for malaise/fatigue.  Eyes:       Wear Glasses or Contacts Floaters  Cardiovascular: Negative for chest pain and orthopnea.  Gastrointestinal: Positive for heartburn.  Musculoskeletal: Positive for neck pain.  Neurological: Positive for headaches.  Psychiatric/Behavioral: Positive for depression. Negative for suicidal ideas. The patient is nervous/anxious (anxiety) and has insomnia.     PHYSICAL EXAM: Blood pressure 123/79, pulse 63, temperature 97.7 F (36.5 C), temperature source Oral, height 5\' 3"  (1.6 m), weight 243 lb (110.2 kg), SpO2 100 %. Body mass index is 43.05 kg/m. Physical Exam  Constitutional: She is oriented to person, place, and time. She appears well-developed and well-nourished.  HENT:  Head: Normocephalic and atraumatic.  Nose: Nose normal.  Eyes: No scleral icterus.  Neck: Normal range of motion. Neck supple. No thyromegaly present.  Cardiovascular: Regular rhythm.  Positive for rate variation  Pulmonary/Chest: Effort normal. No respiratory distress.  Abdominal: Soft. There is no  tenderness.  + obesity  Musculoskeletal: Normal range of motion.  Range of Motion normal in all 4 extremities  Neurological: She is alert and oriented to person, place, and time. Coordination normal.  Skin: Skin is warm and dry.  Psychiatric: She has a normal mood and affect. Her behavior is normal.  Vitals reviewed.   RECENT LABS AND TESTS: BMET    Component Value Date/Time   NA 138 10/11/2017 1122   K 3.6 10/11/2017 1122   CL 104 10/11/2017 1122   CO2 29 10/11/2017 1122   GLUCOSE 113 (H) 10/11/2017 1122   BUN 13 10/11/2017 1122   CREATININE 0.63 10/11/2017 1122   CALCIUM 8.9 10/11/2017 1122   Lab Results  Component Value Date   HGBA1C 5.5 06/13/2017   No results found for: INSULIN CBC    Component Value Date/Time   WBC 5.4 10/11/2017 1122   RBC 4.15 10/11/2017 1122   HGB 13.0 10/11/2017 1122   HCT 39.1 10/11/2017 1122   PLT 314.0  10/11/2017 1122   MCV 94.0 10/11/2017 1122   MCHC 33.3 10/11/2017 1122   RDW 13.2 10/11/2017 1122   LYMPHSABS 2.4 06/12/2017 0927   MONOABS 0.4 06/12/2017 0927   EOSABS 0.1 06/12/2017 0927   BASOSABS 0.0 06/12/2017 0927   Iron/TIBC/Ferritin/ %Sat No results found for: IRON, TIBC, FERRITIN, IRONPCTSAT Lipid Panel     Component Value Date/Time   CHOL 134 05/03/2017 0808   TRIG 53.0 05/03/2017 0808   HDL 46.90 05/03/2017 0808   CHOLHDL 3 05/03/2017 0808   VLDL 10.6 05/03/2017 0808   LDLCALC 77 05/03/2017 0808   Hepatic Function Panel     Component Value Date/Time   PROT 6.6 10/11/2017 1122   ALBUMIN 3.6 10/11/2017 1122   AST 20 10/11/2017 1122   ALT 17 10/11/2017 1122   ALKPHOS 38 (L) 10/11/2017 1122   BILITOT 0.4 10/11/2017 1122      Component Value Date/Time   TSH 0.47 05/14/2017 1344    ECG  shows NSR with a rate of 70 BPM INDIRECT CALORIMETER done today shows a VO2 of 253 and a REE of 1761.  Her calculated basal metabolic rate is 4097 thus her basal metabolic rate is worse than expected.    ASSESSMENT AND PLAN: Other fatigue - Plan: EKG 12-Lead, Vitamin B12, CBC With Differential, Comprehensive metabolic panel, Folate, Hemoglobin A1c, Insulin, random, Lipid Panel With LDL/HDL Ratio, T3, T4, free, TSH, VITAMIN D 25 Hydroxy (Vit-D Deficiency, Fractures)  Shortness of breath on exertion - Plan: CBC With Differential  Other migraine without status migrainosus, not intractable  Essential hypertension  Depression with anxiety  At risk for diabetes mellitus  Class 3 severe obesity with serious comorbidity and body mass index (BMI) of 40.0 to 44.9 in adult, unspecified obesity type (HCC)  PLAN: Fatigue Amorette was informed that her fatigue may be related to obesity, depression or many other causes. Labs will be ordered, and in the meanwhile Arlen has agreed to work on diet, exercise and weight loss to help with fatigue. Proper sleep hygiene was discussed including  the need for 7-8 hours of quality sleep each night. A sleep study was not ordered based on symptoms and Epworth score. We will order indirect calorimetry today.  Dyspnea on exertion Saamiya's shortness of breath appears to be obesity related and exercise induced. She has agreed to work on weight loss and gradually increase exercise to treat her exercise induced shortness of breath. If Elayna follows our instructions and loses weight without  improvement of her shortness of breath, we will plan to refer to pulmonology. We will order labs and indirect calorimetry today and will monitor this condition regularly. Carynn agrees to this plan.  Migraines We will follow up at the next appointment. Maeson agrees to follow up at the agreed upon time.  Hypertension We discussed sodium restriction, working on healthy weight loss, and a regular exercise program as the means to achieve improved blood pressure control. Heavan agreed with this plan and agreed to follow up as directed. We will order EKG and CMP today and will continue to monitor her blood pressure as well as her progress with the above lifestyle modifications. She will watch for signs of hypotension as she continues her lifestyle modifications.  Depression with anxiety Marlyn will continue to take Wellbutrin XL 150 mg daily. We will follow up at the next appointment.  Depression Screen Adasyn had a strongly positive depression screening. Depression is commonly associated with obesity and often results in emotional eating behaviors. We will monitor this closely and work on CBT to help improve the non-hunger eating patterns. Referral to Psychology may be required if no improvement is seen as she continues in our clinic. Armine will continue Wellbutrin and we will follow up at the next appointment.   Diabetes risk counseling Leslee was given extended (15 minutes) diabetes prevention counseling today. She is 27 y.o. female and has risk factors  for diabetes including obesity. We discussed intensive lifestyle modifications today with an emphasis on weight loss as well as increasing exercise and decreasing simple carbohydrates in her diet.  Obesity Lanea is currently in the action stage of change and her goal is to continue with weight loss efforts. I recommend Jordan begin the structured treatment plan as follows:  She has agreed to follow the Category 3 plan Reggie has been instructed to eventually work up to a goal of 150 minutes of combined cardio and strengthening exercise per week for weight loss and overall health benefits. We discussed the following Behavioral Modification Strategies today: planning for success, increasing lean protein intake, increasing vegetables and work on meal planning and easy cooking plans   She was informed of the importance of frequent follow up visits to maximize her success with intensive lifestyle modifications for her multiple health conditions. She was informed we would discuss her lab results at her next visit unless there is a critical issue that needs to be addressed sooner. Elonda agreed to keep her next visit at the agreed upon time to discuss these results.    OBESITY BEHAVIORAL INTERVENTION VISIT  Today's visit was # 1 out of 22.  Starting weight: 243 lbs Starting date: 02/05/18 Today's weight : 243 lbs Today's date: 02/05/2018 Total lbs lost to date: 0 (Patients must lose 7 lbs in the first 6 months to continue with counseling)   ASK: We discussed the diagnosis of obesity with Marylou Flesher today and [place] agreed to give [place] permission to discuss obesity behavioral modification therapy today.  ASSESS: Ting has the diagnosis of obesity and her BMI today is 43.06 Aarthi is in the action stage of change   ADVISE: Astria was educated on the multiple health risks of obesity as well as the benefit of weight loss to improve her health. She was advised of the need for long  term treatment and the importance of lifestyle modifications.  AGREE: Multiple dietary modification options and treatment options were discussed and  Myleigh agreed to the above obesity treatment plan.  I, Doreene Nest, am acting as transcriptionist for  Eber Jones, MD   I have reviewed the above documentation for accuracy and completeness, and I agree with the above. - Ilene Qua, MD

## 2018-02-06 ENCOUNTER — Encounter (INDEPENDENT_AMBULATORY_CARE_PROVIDER_SITE_OTHER): Payer: Self-pay | Admitting: Family Medicine

## 2018-02-06 LAB — COMPREHENSIVE METABOLIC PANEL
ALBUMIN: 4 g/dL (ref 3.5–5.5)
ALT: 12 IU/L (ref 0–32)
AST: 19 IU/L (ref 0–40)
Albumin/Globulin Ratio: 1.4 (ref 1.2–2.2)
Alkaline Phosphatase: 50 IU/L (ref 39–117)
BUN / CREAT RATIO: 15 (ref 9–23)
BUN: 10 mg/dL (ref 6–20)
Bilirubin Total: 0.6 mg/dL (ref 0.0–1.2)
CALCIUM: 8.9 mg/dL (ref 8.7–10.2)
CO2: 23 mmol/L (ref 20–29)
CREATININE: 0.68 mg/dL (ref 0.57–1.00)
Chloride: 102 mmol/L (ref 96–106)
GFR, EST AFRICAN AMERICAN: 139 mL/min/{1.73_m2} (ref >59)
GFR, EST NON AFRICAN AMERICAN: 120 mL/min/{1.73_m2} (ref >59)
GLOBULIN, TOTAL: 2.8 g/dL (ref 1.5–4.5)
GLUCOSE: 93 mg/dL (ref 65–99)
Potassium: 4.2 mmol/L (ref 3.5–5.2)
Sodium: 138 mmol/L (ref 134–144)
TOTAL PROTEIN: 6.8 g/dL (ref 6.0–8.5)

## 2018-02-06 LAB — LIPID PANEL WITH LDL/HDL RATIO
CHOLESTEROL TOTAL: 138 mg/dL (ref 100–199)
HDL: 47 mg/dL (ref >39)
LDL CALC: 74 mg/dL (ref 0–99)
LDl/HDL Ratio: 1.6 ratio (ref 0.0–3.2)
Triglycerides: 85 mg/dL (ref 0–149)
VLDL Cholesterol Cal: 17 mg/dL (ref 5–40)

## 2018-02-06 LAB — CBC WITH DIFFERENTIAL
BASOS ABS: 0 10*3/uL (ref 0.0–0.2)
Basos: 0 %
EOS (ABSOLUTE): 0.1 10*3/uL (ref 0.0–0.4)
EOS: 2 %
HEMATOCRIT: 39.3 % (ref 34.0–46.6)
HEMOGLOBIN: 13.1 g/dL (ref 11.1–15.9)
IMMATURE GRANS (ABS): 0 10*3/uL (ref 0.0–0.1)
IMMATURE GRANULOCYTES: 0 %
LYMPHS ABS: 2.2 10*3/uL (ref 0.7–3.1)
LYMPHS: 40 %
MCH: 31 pg (ref 26.6–33.0)
MCHC: 33.3 g/dL (ref 31.5–35.7)
MCV: 93 fL (ref 79–97)
MONOCYTES: 8 %
Monocytes Absolute: 0.5 10*3/uL (ref 0.1–0.9)
Neutrophils Absolute: 2.7 10*3/uL (ref 1.4–7.0)
Neutrophils: 50 %
RBC: 4.22 x10E6/uL (ref 3.77–5.28)
RDW: 13.4 % (ref 12.3–15.4)
WBC: 5.5 10*3/uL (ref 3.4–10.8)

## 2018-02-06 LAB — VITAMIN B12: VITAMIN B 12: 497 pg/mL (ref 232–1245)

## 2018-02-06 LAB — HEMOGLOBIN A1C
Est. average glucose Bld gHb Est-mCnc: 117 mg/dL
Hgb A1c MFr Bld: 5.7 % — ABNORMAL HIGH (ref 4.8–5.6)

## 2018-02-06 LAB — VITAMIN D 25 HYDROXY (VIT D DEFICIENCY, FRACTURES): VIT D 25 HYDROXY: 19.1 ng/mL — AB (ref 30.0–100.0)

## 2018-02-06 LAB — T4, FREE: FREE T4: 1.04 ng/dL (ref 0.82–1.77)

## 2018-02-06 LAB — FOLATE: FOLATE: 17.6 ng/mL (ref >3.0)

## 2018-02-06 LAB — T3: T3 TOTAL: 125 ng/dL (ref 71–180)

## 2018-02-06 LAB — INSULIN, RANDOM: INSULIN: 11.8 u[IU]/mL (ref 2.6–24.9)

## 2018-02-06 LAB — TSH: TSH: 1.06 u[IU]/mL (ref 0.450–4.500)

## 2018-02-11 ENCOUNTER — Other Ambulatory Visit: Payer: Self-pay | Admitting: Family Medicine

## 2018-02-11 ENCOUNTER — Other Ambulatory Visit (INDEPENDENT_AMBULATORY_CARE_PROVIDER_SITE_OTHER): Payer: 59

## 2018-02-11 DIAGNOSIS — N926 Irregular menstruation, unspecified: Secondary | ICD-10-CM

## 2018-02-11 LAB — HCG, QUANTITATIVE, PREGNANCY: HCG, Total, QN: <2 m[IU]/mL

## 2018-02-11 NOTE — Addendum Note (Signed)
Addended by: Magdalene Molly A on: 02/11/2018 08:34 AM   Modules accepted: Orders

## 2018-02-11 NOTE — Addendum Note (Signed)
Addended by: Caffie Pinto on: 02/11/2018 08:46 AM   Modules accepted: Orders

## 2018-02-11 NOTE — Addendum Note (Signed)
Addended by: Caffie Pinto on: 02/11/2018 09:19 AM   Modules accepted: Orders

## 2018-02-13 ENCOUNTER — Other Ambulatory Visit: Payer: Self-pay

## 2018-02-13 ENCOUNTER — Other Ambulatory Visit (INDEPENDENT_AMBULATORY_CARE_PROVIDER_SITE_OTHER): Payer: 59

## 2018-02-13 DIAGNOSIS — N926 Irregular menstruation, unspecified: Secondary | ICD-10-CM

## 2018-02-13 LAB — HCG, QUANTITATIVE, PREGNANCY: HCG, TOTAL, QN: 2 m[IU]/mL

## 2018-02-15 ENCOUNTER — Ambulatory Visit (INDEPENDENT_AMBULATORY_CARE_PROVIDER_SITE_OTHER): Payer: 59 | Admitting: Psychology

## 2018-02-15 DIAGNOSIS — F4323 Adjustment disorder with mixed anxiety and depressed mood: Secondary | ICD-10-CM | POA: Diagnosis not present

## 2018-02-18 ENCOUNTER — Ambulatory Visit (INDEPENDENT_AMBULATORY_CARE_PROVIDER_SITE_OTHER): Payer: 59 | Admitting: Obstetrics & Gynecology

## 2018-02-18 ENCOUNTER — Encounter: Payer: Self-pay | Admitting: Obstetrics & Gynecology

## 2018-02-18 VITALS — BP 124/71 | HR 76 | Ht 63.0 in | Wt 243.0 lb

## 2018-02-18 DIAGNOSIS — N914 Secondary oligomenorrhea: Secondary | ICD-10-CM | POA: Diagnosis not present

## 2018-02-18 NOTE — Progress Notes (Signed)
GYNECOLOGY OFFICE VISIT NOTE  History:  27 y.o. G1P0 here today for continued evaluation of secondary oligomenorrhea.  Since last visit on 01/14/18, she reports having a short three day period (usual is six days) and having three positive UPT. She had a serum BHCG of 2 five days ago at her PCP's office. Today, she wants to repeat the test and discuss management of her oligomenorrhea knowing that she wants to conceive soon. She is taking prenatal vitamins, has started a weight loss program.  She denies any abnormal vaginal discharge, bleeding, pelvic pain or other concerns.   Past Medical History:  Diagnosis Date  . Anxiety   . Asthma   . Back pain   . Depression   . Fatigue   . Floaters   . GERD (gastroesophageal reflux disease)   . HTN (hypertension)   . Migraine   . Neck pain   . Pre-diabetes   . Shortness of breath   . Trouble in sleeping     Past Surgical History:  Procedure Laterality Date  . CESAREAN SECTION      The following portions of the patient's history were reviewed and updated as appropriate: allergies, current medications, past family history, past medical history, past social history, past surgical history and problem list.   Health Maintenance:  Normal pap on 07/12/2017.  Review of Systems:  Pertinent items noted in HPI and remainder of comprehensive ROS otherwise negative.  Objective:  Physical Exam BP 124/71   Pulse 76   Ht 5\' 3"  (1.6 m)   Wt 243 lb (110.2 kg)   BMI 43.05 kg/m  CONSTITUTIONAL: Well-developed, well-nourished female in no acute distress.  MUSCULOSKELETAL: Normal range of motion. No edema noted. NEUROLOGIC: Alert and oriented to person, place, and time. Normal muscle tone coordination. No cranial nerve deficit noted. PSYCHIATRIC: Normal mood and affect. Normal behavior. Normal judgment and thought content. CARDIOVASCULAR: Normal heart rate noted RESPIRATORY: Effort and breath sounds normal, no problems with respiration noted ABDOMEN:  Soft, no distention noted.   PELVIC: Deferred  Labs and Imaging Results for orders placed or performed in visit on 02/13/18 (from the past 168 hour(s))  hCG, quantitative, pregnancy   Collection Time: 02/13/18  8:44 AM  Result Value Ref Range   HCG, Total, QN 2 mIU/mL  Results for orders placed or performed in visit on 02/11/18 (from the past 168 hour(s))  hCG, quantitative, pregnancy   Collection Time: 02/11/18  9:20 AM  Result Value Ref Range   HCG, Total, QN <2 mIU/mL     Assessment & Plan:  1. Secondary oligomenorrhea Repeat HCG done, also did tests to evaluate for PCOS and other etiologies.  Normal TSH recently, HgA1C 5.7. - Prolactin - Testosterone,Free and Total - DHEA-sulfate - Follicle stimulating hormone - Beta hCG quant (ref lab) If patient does have PCOS, her weight loss plan will be most beneficial.  Also discussed treatment with Metformin +/- Letrozole, information given to her to review at home.  Will follow up labs and manage accordingly. Also discussed need for semen analysis and HSG in the future if she does not conceive.    Routine preventative health maintenance measures emphasized. Please refer to After Visit Summary for other counseling recommendations.   Return in about 2 weeks (around 03/04/2018) for Followup with Dr. Ihor Dow.   Total face-to-face time with patient: 15 minutes.  Over 50% of encounter was spent on counseling and coordination of care.   Verita Schneiders, MD, [place],  Product/process development scientist for [employer], [place]

## 2018-02-18 NOTE — Patient Instructions (Addendum)
   Counseled patient about management of PCOS, recommended weight loss which helps with restoring ovulatory cycles, decreases glucose intolerance with improvement of metabolic risk, improves fertility/pregnancy rates and helps with overall health.  Even modest weight loss (5 to 10 percent reduction in body weight) in women with PCOS may result in these effects.  OCPs are also the mainstay of pharmacologic therapy for women with PCOS for managing hyperandrogenism and menstrual dysfunction and for providing contraception.    PCOS is also treated with Metformin given its association with glucose intolerance and insulin resistance.  Over 50% of PCOS patients on 1500mg  of Metformin daily have been shown to ovulate successfully. Common side effects include GI intolerance, kidney and liver enzyme irregularities, lactic acidosis.  She will need baseline BUN,Cr, LFTs prior to starting therapy; CMET checked today. HgA1C and TSH also checked.  She will also be instructed to stop therapy if she anticipates major stresses, such as surgery or IVF, in order to avoid lactic acidosis  The patient was presented with management with clomiphene citrate (Clomid) or Letrozole (Femara) in addition to Metformin to maximize chances of conception.   The risls including ovarian hyperstimulation with possible risk of ovarian cancer as well as multiple gestation were discussed with patient.  Patient also advised to continue frequent intercourse especially around her ovulation date, ovulation predictor kits will help with this.    For women with PCOS and BMI ?30 kg/m2, letrozole can result in higher cumulative live birth rates than Clomid. Some experts now suggest letrozole over clomiphene for ovulation induction in PCOS patients, but not in patients with a BMI <30 kg/m2. Patents should be advised that ovulation induction is an off-label use of letrozole.    Next Steps HSG Semen Analysis

## 2018-02-19 ENCOUNTER — Ambulatory Visit (INDEPENDENT_AMBULATORY_CARE_PROVIDER_SITE_OTHER): Payer: 59 | Admitting: Family Medicine

## 2018-02-19 VITALS — BP 115/74 | HR 82 | Temp 98.0°F | Ht 63.0 in | Wt 239.0 lb

## 2018-02-19 DIAGNOSIS — Z9189 Other specified personal risk factors, not elsewhere classified: Secondary | ICD-10-CM | POA: Diagnosis not present

## 2018-02-19 DIAGNOSIS — E559 Vitamin D deficiency, unspecified: Secondary | ICD-10-CM | POA: Diagnosis not present

## 2018-02-19 DIAGNOSIS — Z6841 Body Mass Index (BMI) 40.0 and over, adult: Secondary | ICD-10-CM

## 2018-02-19 DIAGNOSIS — R7303 Prediabetes: Secondary | ICD-10-CM

## 2018-02-19 LAB — DHEA-SULFATE: DHEA SO4: 160 ug/dL (ref 84.8–378.0)

## 2018-02-19 LAB — TESTOSTERONE,FREE AND TOTAL
Testosterone, Free: 1.3 pg/mL (ref 0.0–4.2)
Testosterone: 31 ng/dL (ref 8–48)

## 2018-02-19 LAB — BETA HCG QUANT (REF LAB): hCG Quant: <1 m[IU]/mL

## 2018-02-19 LAB — FOLLICLE STIMULATING HORMONE: FSH: 4.8 m[IU]/mL

## 2018-02-19 LAB — PROLACTIN: Prolactin: 8.2 ng/mL (ref 4.8–23.3)

## 2018-02-19 MED ORDER — METFORMIN HCL 500 MG PO TABS: 500.0000 mg | ORAL_TABLET | Freq: Every day | ORAL | 0 refills | Status: DC

## 2018-02-19 MED ORDER — VITAMIN D (ERGOCALCIFEROL) 1.25 MG (50000 UNIT) PO CAPS: 50000.0000 [IU] | ORAL_CAPSULE | ORAL | 0 refills | Status: DC

## 2018-02-19 MED FILL — VIT D2 1.25 MG (50,000 UNIT: 1.25 MG | 28 days supply | Qty: 4 | Fill #0

## 2018-02-19 MED FILL — metFORMIN HCL 500 MG TABS: 500 | 30 days supply | Qty: 30 | Fill #0

## 2018-02-19 NOTE — Progress Notes (Signed)
Office: [phone]  /  Fax: [phone]   HPI:   Chief Complaint: OBESITY Danielle Garcia is here to discuss her progress with her obesity treatment plan. She is on the Category 3 plan and is following her eating plan approximately 80 to 85 % of the time. She states she is exercising 0 minutes 0 times per week. Danielle Garcia has occasional hunger between meals. She has used laughing cow on her sandwich for cheese. Danielle Garcia is getting in 6 to 7 ounces of meet at dinner. Her weight is 239 lb (108.4 kg) today and has had a weight loss of 4 pounds over a period of 2 weeks since her last visit. She has lost 4 lbs since starting treatment with [place].  Vitamin D deficiency Danielle Garcia has a diagnosis of vitamin D deficiency. She is currently taking vit D and admits fatigue, but denies nausea, vomiting or muscle weakness.  Pre-Diabetes Danielle Garcia has a diagnosis of prediabetes. This is not a new diagnosis. She has been prediabetic for three years and was informed this puts her at greater risk of developing diabetes. She is not taking metformin currently and continues to work on diet and exercise to decrease risk of diabetes. She denies nausea or hypoglycemia.  At risk for diabetes Danielle Garcia is at higher than average risk for developing diabetes due to her obesity and prediabetes. She currently denies polyuria or polydipsia.  ALLERGIES: No Known Allergies  MEDICATIONS: Current Outpatient Medications on File Prior to Visit  Medication Sig Dispense Refill  . buPROPion (WELLBUTRIN XL) 150 MG 24 hr tablet Take 1 tablet (150 mg total) by mouth daily. 30 tablet 2  . propranolol ER (INDERAL LA) 60 MG 24 hr capsule Take 1 capsule (60 mg total) by mouth daily. 30 capsule 2   No current facility-administered medications on file prior to visit.     PAST MEDICAL HISTORY: Past Medical History:  Diagnosis Date  . Anxiety   . Asthma   . Back pain   . Depression   . Fatigue   . Floaters   . GERD (gastroesophageal reflux  disease)   . HTN (hypertension)   . Migraine   . Neck pain   . Pre-diabetes   . Shortness of breath   . Trouble in sleeping     PAST SURGICAL HISTORY: Past Surgical History:  Procedure Laterality Date  . CESAREAN SECTION      SOCIAL HISTORY: Social History   Tobacco Use  . Smoking status: Never Smoker  . Smokeless tobacco: Never Used  Substance Use Topics  . Alcohol use: No  . Drug use: No    FAMILY HISTORY: Family History  Problem Relation Age of Onset  . Hypertension Mother   . Heart attack Father 39  . Obesity Father   . Hypertension Maternal Grandmother   . Diabetes Maternal Grandmother   . Lung cancer Maternal Grandfather   . Diabetes Paternal Grandmother   . Allergies Daughter     ROS: Review of Systems  Constitutional: Positive for malaise/fatigue and weight loss.  Gastrointestinal: Negative for nausea and vomiting.  Genitourinary: Negative for frequency.  Musculoskeletal:       Negative for muscle weakness  Endo/Heme/Allergies: Negative for polydipsia.    PHYSICAL EXAM: Blood pressure 115/74, pulse 82, temperature 98 F (36.7 C), temperature source Oral, height 5\' 3"  (1.6 m), weight 239 lb (108.4 kg), last menstrual period 02/11/2018, SpO2 98 %. Body mass index is 42.34 kg/m. Physical Exam  Constitutional: She is oriented to person, place, and  time. She appears well-developed and well-nourished.  Cardiovascular: Normal rate.  Pulmonary/Chest: Effort normal.  Musculoskeletal: Normal range of motion.  Neurological: She is oriented to person, place, and time.  Skin: Skin is warm and dry.  Psychiatric: She has a normal mood and affect. Her behavior is normal.  Vitals reviewed.   RECENT LABS AND TESTS: BMET    Component Value Date/Time   NA 138 02/05/2018 1120   K 4.2 02/05/2018 1120   CL 102 02/05/2018 1120   CO2 23 02/05/2018 1120   GLUCOSE 93 02/05/2018 1120   GLUCOSE 113 (H) 10/11/2017 1122   BUN 10 02/05/2018 1120   CREATININE 0.68  02/05/2018 1120   CALCIUM 8.9 02/05/2018 1120   GFRNONAA 120 02/05/2018 1120   GFRAA 139 02/05/2018 1120   Lab Results  Component Value Date   HGBA1C 5.7 (H) 02/05/2018   HGBA1C 5.5 06/13/2017   HGBA1C 5.4 10/26/2016   Lab Results  Component Value Date   INSULIN 11.8 02/05/2018   CBC    Component Value Date/Time   WBC 5.5 02/05/2018 1120   WBC 5.4 10/11/2017 1122   RBC 4.22 02/05/2018 1120   RBC 4.15 10/11/2017 1122   HGB 13.1 02/05/2018 1120   HCT 39.3 02/05/2018 1120   PLT 314.0 10/11/2017 1122   MCV 93 02/05/2018 1120   MCH 31.0 02/05/2018 1120   MCHC 33.3 02/05/2018 1120   MCHC 33.3 10/11/2017 1122   RDW 13.4 02/05/2018 1120   LYMPHSABS 2.2 02/05/2018 1120   MONOABS 0.4 06/12/2017 0927   EOSABS 0.1 02/05/2018 1120   BASOSABS 0.0 02/05/2018 1120   Iron/TIBC/Ferritin/ %Sat No results found for: IRON, TIBC, FERRITIN, IRONPCTSAT Lipid Panel     Component Value Date/Time   CHOL 138 02/05/2018 1120   TRIG 85 02/05/2018 1120   HDL 47 02/05/2018 1120   CHOLHDL 3 05/03/2017 0808   VLDL 10.6 05/03/2017 0808   LDLCALC 74 02/05/2018 1120   Hepatic Function Panel     Component Value Date/Time   PROT 6.8 02/05/2018 1120   ALBUMIN 4.0 02/05/2018 1120   AST 19 02/05/2018 1120   ALT 12 02/05/2018 1120   ALKPHOS 50 02/05/2018 1120   BILITOT 0.6 02/05/2018 1120      Component Value Date/Time   TSH 1.060 02/05/2018 1120   TSH 0.47 05/14/2017 1344   Results for Danielle, Garcia (MRN [id number]) as of 02/19/2018 16:27  Ref. Range 02/05/2018 11:20  Vitamin D, 25-Hydroxy Latest Ref Range: 30.0 - 100.0 ng/mL 19.1 (L)   ASSESSMENT AND PLAN: Vitamin D deficiency - Plan: Vitamin D, Ergocalciferol, (DRISDOL) 50000 units CAPS capsule  Prediabetes - Plan: metFORMIN (GLUCOPHAGE) 500 MG tablet  At risk for diabetes mellitus  Class 3 severe obesity with serious comorbidity and body mass index (BMI) of 40.0 to 44.9 in adult, unspecified obesity type ([place])  PLAN:  Vitamin D  Deficiency Danielle Garcia was informed that low vitamin D levels contributes to fatigue and are associated with obesity, breast, and colon cancer. She agrees to continue to take prescription Vit D @50 ,000 IU every week #4 with no refills and will follow up for routine testing of vitamin D, at least 2-3 times per year. She was informed of the risk of over-replacement of vitamin D and agrees to not increase her dose unless she discusses this with [place] first. Danielle Garcia agrees to follow up as directed.  Pre-Diabetes Danielle Garcia will continue to work on weight loss, exercise, and decreasing simple carbohydrates in her diet to help decrease  the risk of diabetes. We dicussed metformin including benefits and risks. She was informed that eating too many simple carbohydrates or too many calories at one sitting increases the likelihood of GI side effects. Danielle Garcia agrees to start  metformin 500 mg qAM #30 with no refills. She was encouraged to practice contraception management. Danielle Garcia agreed to follow up with [place] as directed to monitor her progress.  Diabetes risk counseling Danielle Garcia was given extended (30 minutes) diabetes prevention counseling today. She is 27 y.o. female and has risk factors for diabetes including obesity and pre-diabetes. We discussed intensive lifestyle modifications today with an emphasis on weight loss as well as increasing exercise and decreasing simple carbohydrates in her diet.  Obesity Danielle Garcia is currently in the action stage of change. As such, her goal is to continue with weight loss efforts She has agreed to follow the Category 3 plan Danielle Garcia has been instructed to work up to a goal of 150 minutes of combined cardio and strengthening exercise per week for weight loss and overall health benefits. We discussed the following Behavioral Modification Strategies today: planning for success, increasing lean protein intake, increasing vegetables and work on meal planning and easy cooking plans  Danielle Garcia  has agreed to follow up with our clinic in 2 weeks. She was informed of the importance of frequent follow up visits to maximize her success with intensive lifestyle modifications for her multiple health conditions.   OBESITY BEHAVIORAL INTERVENTION VISIT  Today's visit was # 2 out of 22.  Starting weight: 243 lbs Starting date: 02/05/18 Today's weight : 239 lbs  Today's date: 02/19/2018 Total lbs lost to date: 4    ASK: We discussed the diagnosis of obesity with Danielle Garcia today and Danielle Garcia agreed to give [place] permission to discuss obesity behavioral modification therapy today.  ASSESS: Danielle Garcia has the diagnosis of obesity and her BMI today is 42.35 Danielle Garcia is in the action stage of change   ADVISE: Danielle Garcia was educated on the multiple health risks of obesity as well as the benefit of weight loss to improve her health. She was advised of the need for long term treatment and the importance of lifestyle modifications.  AGREE: Multiple dietary modification options and treatment options were discussed and  Adaysha agreed to the above obesity treatment plan.  I, Danielle Garcia, am acting as transcriptionist for Eber Jones, MD  I have reviewed the above documentation for accuracy and completeness, and I agree with the above. - Danielle Qua, MD

## 2018-02-20 ENCOUNTER — Encounter: Payer: Self-pay | Admitting: Family Medicine

## 2018-02-20 ENCOUNTER — Ambulatory Visit (INDEPENDENT_AMBULATORY_CARE_PROVIDER_SITE_OTHER): Payer: 59 | Admitting: Family Medicine

## 2018-02-20 ENCOUNTER — Encounter (INDEPENDENT_AMBULATORY_CARE_PROVIDER_SITE_OTHER): Payer: Self-pay

## 2018-02-20 VITALS — BP 120/82 | HR 68 | Temp 97.9°F | Ht 63.0 in | Wt 239.0 lb

## 2018-02-20 DIAGNOSIS — F900 Attention-deficit hyperactivity disorder, predominantly inattentive type: Secondary | ICD-10-CM

## 2018-02-20 MED ORDER — AMPHETAMINE-DEXTROAMPHET ER 15 MG PO CP24: 15.0000 mg | ORAL_CAPSULE | ORAL | 0 refills | Status: DC

## 2018-02-20 MED FILL — ADDERALL XR 15 MG CAP SA: 15 | 30 days supply | Qty: 30 | Fill #0

## 2018-02-20 NOTE — Patient Instructions (Signed)
If you do not hear anything about your referral in the next 1-2 weeks, call our office and ask for an update.  Stay active.  Let us know if you need anything.

## 2018-02-20 NOTE — Progress Notes (Signed)
Pre visit review using our clinic review tool, if applicable. No additional management support is needed unless otherwise documented below in the visit note. 

## 2018-02-20 NOTE — Progress Notes (Signed)
Chief Complaint  Patient presents with  . problems staying focused    Subjective: Patient is a 27 y.o. female here for inattention.  Patient has a history of ADHD.  She was diagnosed in the early stages of elementary school.  Her mother chose not to start on any medication.  While her hyperactivity has improved, she continues to have issues with inattention.  She is not currently on any medication.  She does follow with a counselor and is currently being treated for depression.  She is well controlled with Wellbutrin 150 mg XL daily.  She does have a family history of ADHD.   ROS:  Psych: As noted in HPI  Past Medical History:  Diagnosis Date  . Anxiety   . Asthma   . Back pain   . Depression   . Fatigue   . Floaters   . GERD (gastroesophageal reflux disease)   . HTN (hypertension)   . Migraine   . Neck pain   . Pre-diabetes   . Shortness of breath   . Trouble in sleeping     Objective: BP 120/82 (BP Location: Left Arm, Patient Position: Sitting, Cuff Size: Normal)   Pulse 68   Temp 97.9 F (36.6 C) (Oral)   Ht 5\' 3"  (1.6 m)   Wt 239 lb (108.4 kg)   LMP 02/11/2018   SpO2 96%   BMI 42.34 kg/m  General: Awake, appears stated age 70: MMM, EOMi Heart: RRR, no murmurs Psych: Age appropriate judgment and insight, normal affect and mood  Assessment and Plan: Attention deficit hyperactivity disorder (ADHD), predominantly inattentive type - Plan: amphetamine-dextroamphetamine (ADDERALL XR) 15 MG 24 hr capsule, Ambulatory referral to Psychology  Patient with history of ADHD.  Likely has grown out of the hyperactivity aspect.  Will start medication empirically while awaiting formal evaluation. Follow-up in 6 weeks to recheck how we are doing on medication. The patient voiced understanding and agreement to the plan.  Pala, DO 02/20/18  12:53 PM

## 2018-03-05 ENCOUNTER — Ambulatory Visit (INDEPENDENT_AMBULATORY_CARE_PROVIDER_SITE_OTHER): Payer: 59 | Admitting: Physician Assistant

## 2018-03-05 VITALS — BP 106/69 | HR 69 | Temp 97.8°F | Ht 63.0 in | Wt 241.0 lb

## 2018-03-05 DIAGNOSIS — Z6841 Body Mass Index (BMI) 40.0 and over, adult: Secondary | ICD-10-CM | POA: Diagnosis not present

## 2018-03-05 DIAGNOSIS — E559 Vitamin D deficiency, unspecified: Secondary | ICD-10-CM | POA: Diagnosis not present

## 2018-03-05 NOTE — Progress Notes (Signed)
Office: [phone]  /  Fax: [phone]   HPI:   Chief Complaint: OBESITY Danielle Garcia is here to discuss her progress with her obesity treatment plan. She is on the Category 3 plan and is following her eating plan approximately 85 % of the time. She states she is exercising 0 minutes 0 times per week. Danielle Garcia reports struggling to follow the plan due to stress eating. She also states that she had a birthday recently and indulged. She reports being bored with the meal plan.  Her weight is 241 lb (109.3 kg) today and has gained 2 pounds since her last visit. She has lost 2 lbs since starting treatment with [place].  Vitamin D Deficiency Danielle Garcia has a diagnosis of vitamin D deficiency. She is on prescription Vit D and denies nausea, vomiting or muscle weakness.  ALLERGIES: No Known Allergies  MEDICATIONS: Current Outpatient Medications on File Prior to Visit  Medication Sig Dispense Refill  . amphetamine-dextroamphetamine (ADDERALL XR) 15 MG 24 hr capsule Take 1 capsule by mouth every morning. 30 capsule 0  . buPROPion (WELLBUTRIN XL) 150 MG 24 hr tablet Take 1 tablet (150 mg total) by mouth daily. 30 tablet 2  . metFORMIN (GLUCOPHAGE) 500 MG tablet Take 1 tablet (500 mg total) by mouth daily with breakfast. 30 tablet 0  . propranolol ER (INDERAL LA) 60 MG 24 hr capsule Take 1 capsule (60 mg total) by mouth daily. 30 capsule 2  . Vitamin D, Ergocalciferol, (DRISDOL) 50000 units CAPS capsule Take 1 capsule (50,000 Units total) by mouth every 7 (seven) days. 4 capsule 0   No current facility-administered medications on file prior to visit.     PAST MEDICAL HISTORY: Past Medical History:  Diagnosis Date  . Anxiety   . Asthma   . Back pain   . Depression   . Fatigue   . Floaters   . GERD (gastroesophageal reflux disease)   . HTN (hypertension)   . Migraine   . Neck pain   . Pre-diabetes   . Shortness of breath   . Trouble in sleeping     PAST SURGICAL HISTORY: Past Surgical  History:  Procedure Laterality Date  . CESAREAN SECTION      SOCIAL HISTORY: Social History   Tobacco Use  . Smoking status: Never Smoker  . Smokeless tobacco: Never Used  Substance Use Topics  . Alcohol use: No  . Drug use: No    FAMILY HISTORY: Family History  Problem Relation Age of Onset  . Hypertension Mother   . Heart attack Father 57  . Obesity Father   . Hypertension Maternal Grandmother   . Diabetes Maternal Grandmother   . Lung cancer Maternal Grandfather   . Diabetes Paternal Grandmother   . Allergies Daughter     ROS: Review of Systems  Constitutional: Negative for weight loss.  Gastrointestinal: Negative for nausea and vomiting.  Musculoskeletal:       Negative muscle weakness    PHYSICAL EXAM: Blood pressure 106/69, pulse 69, temperature 97.8 F (36.6 C), temperature source Oral, height 5\' 3"  (1.6 m), weight 241 lb (109.3 kg), last menstrual period 02/11/2018, SpO2 99 %. Body mass index is 42.69 kg/m. Physical Exam  Constitutional: She is oriented to person, place, and time. She appears well-developed and well-nourished.  Cardiovascular: Normal rate.  Pulmonary/Chest: Effort normal.  Musculoskeletal: Normal range of motion.  Neurological: She is oriented to person, place, and time.  Skin: Skin is warm and dry.  Psychiatric: She has a normal  mood and affect. Her behavior is normal.  Vitals reviewed.   RECENT LABS AND TESTS: BMET    Component Value Date/Time   NA 138 02/05/2018 1120   K 4.2 02/05/2018 1120   CL 102 02/05/2018 1120   CO2 23 02/05/2018 1120   GLUCOSE 93 02/05/2018 1120   GLUCOSE 113 (H) 10/11/2017 1122   BUN 10 02/05/2018 1120   CREATININE 0.68 02/05/2018 1120   CALCIUM 8.9 02/05/2018 1120   GFRNONAA 120 02/05/2018 1120   GFRAA 139 02/05/2018 1120   Lab Results  Component Value Date   HGBA1C 5.7 (H) 02/05/2018   HGBA1C 5.5 06/13/2017   HGBA1C 5.4 10/26/2016   Lab Results  Component Value Date   INSULIN 11.8  02/05/2018   CBC    Component Value Date/Time   WBC 5.5 02/05/2018 1120   WBC 5.4 10/11/2017 1122   RBC 4.22 02/05/2018 1120   RBC 4.15 10/11/2017 1122   HGB 13.1 02/05/2018 1120   HCT 39.3 02/05/2018 1120   PLT 314.0 10/11/2017 1122   MCV 93 02/05/2018 1120   MCH 31.0 02/05/2018 1120   MCHC 33.3 02/05/2018 1120   MCHC 33.3 10/11/2017 1122   RDW 13.4 02/05/2018 1120   LYMPHSABS 2.2 02/05/2018 1120   MONOABS 0.4 06/12/2017 0927   EOSABS 0.1 02/05/2018 1120   BASOSABS 0.0 02/05/2018 1120   Iron/TIBC/Ferritin/ %Sat No results found for: IRON, TIBC, FERRITIN, IRONPCTSAT Lipid Panel     Component Value Date/Time   CHOL 138 02/05/2018 1120   TRIG 85 02/05/2018 1120   HDL 47 02/05/2018 1120   CHOLHDL 3 05/03/2017 0808   VLDL 10.6 05/03/2017 0808   LDLCALC 74 02/05/2018 1120   Hepatic Function Panel     Component Value Date/Time   PROT 6.8 02/05/2018 1120   ALBUMIN 4.0 02/05/2018 1120   AST 19 02/05/2018 1120   ALT 12 02/05/2018 1120   ALKPHOS 50 02/05/2018 1120   BILITOT 0.6 02/05/2018 1120      Component Value Date/Time   TSH 1.060 02/05/2018 1120   TSH 0.47 05/14/2017 1344  Results for LAMICA, MCCART (MRN [id number]) as of 03/05/2018 16:35  Ref. Range 02/05/2018 11:20  Vitamin D, 25-Hydroxy Latest Ref Range: 30.0 - 100.0 ng/mL 19.1 (L)    ASSESSMENT AND PLAN: Vitamin D deficiency  Class 3 severe obesity with serious comorbidity and body mass index (BMI) of 40.0 to 44.9 in adult, unspecified obesity type ([facility])  PLAN:  Vitamin D Deficiency Danielle Garcia was informed that low vitamin D levels contributes to fatigue and are associated with obesity, breast, and colon cancer. Danielle Garcia agrees to continue taking prescription Vit D @50 ,000 IU every week and will follow up for routine testing of vitamin D, at least 2-3 times per year. She was informed of the risk of over-replacement of vitamin D and agrees to not increase her dose unless she discusses this with [place] first.  Danielle Garcia agrees to follow up with our clinic in 2 weeks.  We spent > than 50% of the 15 minute visit on the counseling as documented in the note.  Obesity Danielle Garcia is currently in the action stage of change. As such, her goal is to continue with weight loss efforts She has agreed to keep a food journal with 450-600 calories and 40+ grams of protein at supper daily and follow the Category 3 Danielle Garcia has been instructed to work up to a goal of 150 minutes of combined cardio and strengthening exercise per week for weight loss  and overall health benefits. We discussed the following Behavioral Modification Strategies today: work on meal planning and easy cooking plans and ways to avoid boredom eating   Danielle Garcia has agreed to follow up with our clinic in 2 weeks. She was informed of the importance of frequent follow up visits to maximize her success with intensive lifestyle modifications for her multiple health conditions.   OBESITY BEHAVIORAL INTERVENTION VISIT  Today's visit was # 3.  Starting weight: 243 lbs Starting date: 02/05/18 Today's weight : 241 lbs  Today's date: 03/05/2018 Total lbs lost to date: 2    ASK: We discussed the diagnosis of obesity with Danielle Garcia today and Danielle Garcia agreed to give [place] permission to discuss obesity behavioral modification therapy today.  ASSESS: Danielle Garcia has the diagnosis of obesity and her BMI today is 42.7 Danielle Garcia is in the action stage of change   ADVISE: Danielle Garcia was educated on the multiple health risks of obesity as well as the benefit of weight loss to improve her health. She was advised of the need for long term treatment and the importance of lifestyle modifications.  AGREE: Multiple dietary modification options and treatment options were discussed and  Danielle Garcia agreed to the above obesity treatment plan.  Wilhemena Durie, am acting as transcriptionist for Abby Potash, PA-C I, Abby Potash, PA-C have reviewed above note and  agree with its content

## 2018-03-07 ENCOUNTER — Ambulatory Visit: Payer: Self-pay | Admitting: Obstetrics & Gynecology

## 2018-03-15 ENCOUNTER — Ambulatory Visit (INDEPENDENT_AMBULATORY_CARE_PROVIDER_SITE_OTHER): Payer: 59 | Admitting: Psychology

## 2018-03-15 DIAGNOSIS — F4323 Adjustment disorder with mixed anxiety and depressed mood: Secondary | ICD-10-CM | POA: Diagnosis not present

## 2018-03-21 ENCOUNTER — Ambulatory Visit (INDEPENDENT_AMBULATORY_CARE_PROVIDER_SITE_OTHER): Payer: 59 | Admitting: Physician Assistant

## 2018-03-26 ENCOUNTER — Ambulatory Visit (INDEPENDENT_AMBULATORY_CARE_PROVIDER_SITE_OTHER): Payer: 59 | Admitting: Physician Assistant

## 2018-03-26 VITALS — BP 114/74 | HR 66 | Temp 97.7°F | Ht 63.0 in | Wt 238.0 lb

## 2018-03-26 DIAGNOSIS — Z6841 Body Mass Index (BMI) 40.0 and over, adult: Secondary | ICD-10-CM

## 2018-03-26 DIAGNOSIS — E559 Vitamin D deficiency, unspecified: Secondary | ICD-10-CM

## 2018-03-27 NOTE — Progress Notes (Signed)
Office: [phone]  /  Fax: [phone]   HPI:   Chief Complaint: OBESITY Danielle Garcia is here to discuss her progress with her obesity treatment plan. She is on the keep a food journal with 450-600 calories and 40+ grams of protein at supper daily and follow the Category 3 plan and is following her eating plan approximately 65 % of the time. She states she is walking for 60 minutes 5 times per week. Danielle Garcia did well with weight loss. She reports that she is not always hungry due to adderall but she is trying to get all the food in daily.  Her weight is 238 lb (108 kg) today and has had a weight loss of 3 pounds over a period of 3 weeks since her last visit. She has lost 5 lbs since starting treatment with [place].  Vitamin D Deficiency Danielle Garcia has a diagnosis of vitamin D deficiency. She is on prescription Vit D and denies nausea, vomiting or muscle weakness.  ALLERGIES: No Known Allergies  MEDICATIONS: Current Outpatient Medications on File Prior to Visit  Medication Sig Dispense Refill  . amphetamine-dextroamphetamine (ADDERALL XR) 15 MG 24 hr capsule Take 1 capsule by mouth every morning. 30 capsule 0  . buPROPion (WELLBUTRIN XL) 150 MG 24 hr tablet Take 1 tablet (150 mg total) by mouth daily. 30 tablet 2  . metFORMIN (GLUCOPHAGE) 500 MG tablet Take 1 tablet (500 mg total) by mouth daily with breakfast. 30 tablet 0  . propranolol ER (INDERAL LA) 60 MG 24 hr capsule Take 1 capsule (60 mg total) by mouth daily. 30 capsule 2  . Vitamin D, Ergocalciferol, (DRISDOL) 50000 units CAPS capsule Take 1 capsule (50,000 Units total) by mouth every 7 (seven) days. 4 capsule 0   No current facility-administered medications on file prior to visit.     PAST MEDICAL HISTORY: Past Medical History:  Diagnosis Date  . Anxiety   . Asthma   . Back pain   . Depression   . Fatigue   . Floaters   . GERD (gastroesophageal reflux disease)   . HTN (hypertension)   . Migraine   . Neck pain   .  Pre-diabetes   . Shortness of breath   . Trouble in sleeping     PAST SURGICAL HISTORY: Past Surgical History:  Procedure Laterality Date  . CESAREAN SECTION      SOCIAL HISTORY: Social History   Tobacco Use  . Smoking status: Never Smoker  . Smokeless tobacco: Never Used  Substance Use Topics  . Alcohol use: No  . Drug use: No    FAMILY HISTORY: Family History  Problem Relation Age of Onset  . Hypertension Mother   . Heart attack Father 36  . Obesity Father   . Hypertension Maternal Grandmother   . Diabetes Maternal Grandmother   . Lung cancer Maternal Grandfather   . Diabetes Paternal Grandmother   . Allergies Daughter     ROS: Review of Systems  Constitutional: Positive for weight loss.  Gastrointestinal: Negative for nausea and vomiting.  Musculoskeletal:       Negative muscle weakness    PHYSICAL EXAM: Blood pressure 114/74, pulse 66, temperature 97.7 F (36.5 C), temperature source Oral, height 5\' 3"  (1.6 m), weight 238 lb (108 kg), last menstrual period 03/08/2018, SpO2 100 %. Body mass index is 42.16 kg/m. Physical Exam  Constitutional: She is oriented to person, place, and time. She appears well-developed and well-nourished.  Cardiovascular: Normal rate.  Pulmonary/Chest: Effort normal.  Musculoskeletal:  Normal range of motion.  Neurological: She is oriented to person, place, and time.  Skin: Skin is warm and dry.  Psychiatric: She has a normal mood and affect. Her behavior is normal.  Vitals reviewed.   RECENT LABS AND TESTS: BMET    Component Value Date/Time   NA 138 02/05/2018 1120   K 4.2 02/05/2018 1120   CL 102 02/05/2018 1120   CO2 23 02/05/2018 1120   GLUCOSE 93 02/05/2018 1120   GLUCOSE 113 (H) 10/11/2017 1122   BUN 10 02/05/2018 1120   CREATININE 0.68 02/05/2018 1120   CALCIUM 8.9 02/05/2018 1120   GFRNONAA 120 02/05/2018 1120   GFRAA 139 02/05/2018 1120   Lab Results  Component Value Date   HGBA1C 5.7 (H) 02/05/2018    HGBA1C 5.5 06/13/2017   HGBA1C 5.4 10/26/2016   Lab Results  Component Value Date   INSULIN 11.8 02/05/2018   CBC    Component Value Date/Time   WBC 5.5 02/05/2018 1120   WBC 5.4 10/11/2017 1122   RBC 4.22 02/05/2018 1120   RBC 4.15 10/11/2017 1122   HGB 13.1 02/05/2018 1120   HCT 39.3 02/05/2018 1120   PLT 314.0 10/11/2017 1122   MCV 93 02/05/2018 1120   MCH 31.0 02/05/2018 1120   MCHC 33.3 02/05/2018 1120   MCHC 33.3 10/11/2017 1122   RDW 13.4 02/05/2018 1120   LYMPHSABS 2.2 02/05/2018 1120   MONOABS 0.4 06/12/2017 0927   EOSABS 0.1 02/05/2018 1120   BASOSABS 0.0 02/05/2018 1120   Iron/TIBC/Ferritin/ %Sat No results found for: IRON, TIBC, FERRITIN, IRONPCTSAT Lipid Panel     Component Value Date/Time   CHOL 138 02/05/2018 1120   TRIG 85 02/05/2018 1120   HDL 47 02/05/2018 1120   CHOLHDL 3 05/03/2017 0808   VLDL 10.6 05/03/2017 0808   LDLCALC 74 02/05/2018 1120   Hepatic Function Panel     Component Value Date/Time   PROT 6.8 02/05/2018 1120   ALBUMIN 4.0 02/05/2018 1120   AST 19 02/05/2018 1120   ALT 12 02/05/2018 1120   ALKPHOS 50 02/05/2018 1120   BILITOT 0.6 02/05/2018 1120      Component Value Date/Time   TSH 1.060 02/05/2018 1120   TSH 0.47 05/14/2017 1344  Results for TYLENA, PRISK (MRN [id number]) as of 03/27/2018 08:30  Ref. Range 02/05/2018 11:20  Vitamin D, 25-Hydroxy Latest Ref Range: 30.0 - 100.0 ng/mL 19.1 (L)    ASSESSMENT AND PLAN: Vitamin D deficiency  Class 3 severe obesity with serious comorbidity and body mass index (BMI) of 40.0 to 44.9 in adult, unspecified obesity type (HCC)  PLAN:  Vitamin D Deficiency Danielle Garcia was informed that low vitamin D levels contributes to fatigue and are associated with obesity, breast, and colon cancer. Danielle Garcia agrees to continue taking prescription Vit D @50 ,000 IU every week and will follow up for routine testing of vitamin D, at least 2-3 times per year. She was informed of the risk of  over-replacement of vitamin D and agrees to not increase her dose unless she discusses this with [place] first. Danielle Garcia agrees to follow up with our clinic in 3 weeks.  I spent > than 50% of the 15 minute visit on counseling as documented in the note.  Obesity Danielle Garcia is currently in the action stage of change. As such, her goal is to continue with weight loss efforts She has agreed to change to keep a food journal with 350-500 calories and 35 grams of protein at lunch daily and follow  the Category 3 plan Danielle Garcia has been instructed to work up to a goal of 150 minutes of combined cardio and strengthening exercise per week for weight loss and overall health benefits. We discussed the following Behavioral Modification Strategies today: work on meal planning and easy cooking plans and ways to avoid boredom eating   Danielle Garcia has agreed to follow up with our clinic in 3 weeks. She was informed of the importance of frequent follow up visits to maximize her success with intensive lifestyle modifications for her multiple health conditions.   OBESITY BEHAVIORAL INTERVENTION VISIT  Today's visit was # 4   Starting weight: 243 lbs Starting date: 02/05/18 Today's weight : 238 lbs  Today's date: 03/26/2018 Total lbs lost to date: 5    ASK: We discussed the diagnosis of obesity with Danielle Garcia today and [place] agreed to give [place] permission to discuss obesity behavioral modification therapy today.  ASSESS: Danielle Garcia has the diagnosis of obesity and her BMI today is 42.17 Ameliyah is in the action stage of change   ADVISE: Danielle Garcia was educated on the multiple health risks of obesity as well as the benefit of weight loss to improve her health. She was advised of the need for long term treatment and the importance of lifestyle modifications.  AGREE: Multiple dietary modification options and treatment options were discussed and  Danielle Garcia agreed to the above obesity treatment plan.  Danielle Garcia,  am acting as transcriptionist for Abby Potash, PA-C I, Abby Potash, PA-C have reviewed above note and agree with its content

## 2018-03-28 ENCOUNTER — Other Ambulatory Visit (INDEPENDENT_AMBULATORY_CARE_PROVIDER_SITE_OTHER): Payer: Self-pay | Admitting: Family Medicine

## 2018-03-28 ENCOUNTER — Other Ambulatory Visit: Payer: Self-pay | Admitting: Family Medicine

## 2018-03-28 DIAGNOSIS — R7303 Prediabetes: Secondary | ICD-10-CM

## 2018-03-28 DIAGNOSIS — F900 Attention-deficit hyperactivity disorder, predominantly inattentive type: Secondary | ICD-10-CM

## 2018-03-28 MED ORDER — METFORMIN HCL 500 MG PO TABS: 500.0000 mg | ORAL_TABLET | Freq: Every day | ORAL | 0 refills | Status: DC

## 2018-03-28 MED FILL — metFORMIN HCL 500 MG TABS: 500 | 19 days supply | Qty: 19 | Fill #0

## 2018-03-29 MED ORDER — AMPHETAMINE-DEXTROAMPHET ER 15 MG PO CP24: 15.0000 mg | ORAL_CAPSULE | ORAL | 0 refills | Status: DC

## 2018-03-29 MED FILL — ADDERALL XR 15 MG CAP SA: 15 | 30 days supply | Qty: 30 | Fill #0

## 2018-04-03 ENCOUNTER — Telehealth: Payer: Self-pay | Admitting: Family Medicine

## 2018-04-03 MED ORDER — MELOXICAM 15 MG PO TABS: 15.0000 mg | ORAL_TABLET | Freq: Every day | ORAL | 0 refills | Status: DC

## 2018-04-03 NOTE — Telephone Encounter (Signed)
The patient would like something sent in for her back pain.  Pain is in the lower back.  Advise please

## 2018-04-03 NOTE — Telephone Encounter (Signed)
Patient aware.

## 2018-04-03 NOTE — Telephone Encounter (Signed)
OK 

## 2018-04-09 MED FILL — buPROPion HCL ER (XL) 150 M: 150 | 30 days supply | Qty: 30 | Fill #2

## 2018-04-11 ENCOUNTER — Other Ambulatory Visit: Payer: Self-pay

## 2018-04-11 MED ORDER — MOMETASONE FUROATE 50 MCG/ACT NA SUSP: 2.0000 | Freq: Every day | NASAL | 2 refills | Status: DC

## 2018-04-11 MED FILL — MOMETASONE FUROATE 50 MCG S: 50 | 30 days supply | Qty: 17 | Fill #0

## 2018-04-12 ENCOUNTER — Encounter (INDEPENDENT_AMBULATORY_CARE_PROVIDER_SITE_OTHER): Payer: Self-pay | Admitting: Physician Assistant

## 2018-04-16 ENCOUNTER — Encounter (INDEPENDENT_AMBULATORY_CARE_PROVIDER_SITE_OTHER): Payer: Self-pay

## 2018-04-16 ENCOUNTER — Ambulatory Visit (INDEPENDENT_AMBULATORY_CARE_PROVIDER_SITE_OTHER): Payer: 59 | Admitting: Physician Assistant

## 2018-04-17 ENCOUNTER — Telehealth: Payer: Self-pay | Admitting: *Deleted

## 2018-04-17 ENCOUNTER — Ambulatory Visit (INDEPENDENT_AMBULATORY_CARE_PROVIDER_SITE_OTHER): Payer: 59 | Admitting: Psychology

## 2018-04-17 DIAGNOSIS — F4323 Adjustment disorder with mixed anxiety and depressed mood: Secondary | ICD-10-CM | POA: Diagnosis not present

## 2018-04-17 MED ORDER — CYCLOBENZAPRINE HCL 10 MG PO TABS: 10.0000 mg | ORAL_TABLET | Freq: Three times a day (TID) | ORAL | 0 refills | Status: DC | PRN

## 2018-04-17 MED FILL — CYCLOBENZAPRINE HCL 10 MG T: 10 | 10 days supply | Qty: 30 | Fill #0

## 2018-04-17 NOTE — Telephone Encounter (Signed)
Refill request for Cyclobenzaprine 10 mg TID PRN muscle spasms Last filled by MD on - 03/15/17, #20x0 Last AEX - 01/22/18 Please Advise on refills/SLS 10/02

## 2018-05-06 ENCOUNTER — Encounter: Payer: Self-pay | Admitting: Family Medicine

## 2018-05-06 ENCOUNTER — Ambulatory Visit (INDEPENDENT_AMBULATORY_CARE_PROVIDER_SITE_OTHER): Payer: 59 | Admitting: Family Medicine

## 2018-05-06 VITALS — BP 122/86 | HR 75 | Temp 98.1°F | Ht 62.5 in | Wt 240.0 lb

## 2018-05-06 DIAGNOSIS — G43809 Other migraine, not intractable, without status migrainosus: Secondary | ICD-10-CM

## 2018-05-06 MED ORDER — ELETRIPTAN HYDROBROMIDE 20 MG PO TABS: 20.0000 mg | ORAL_TABLET | ORAL | 0 refills | Status: DC | PRN

## 2018-05-06 MED ORDER — PROPRANOLOL HCL ER 80 MG PO CP24: 80.0000 mg | ORAL_CAPSULE | Freq: Every day | ORAL | 1 refills | Status: DC

## 2018-05-06 MED ORDER — KETOROLAC TROMETHAMINE 60 MG/2ML IM SOLN
60.0000 mg | Freq: Once | INTRAMUSCULAR | Status: AC
  Administered 2018-05-06: 60 mg via INTRAMUSCULAR

## 2018-05-06 NOTE — Progress Notes (Signed)
Chief Complaint  Patient presents with  . Migraine     Danielle Garcia is a 27 y.o. female here for evaluation of an acute headache.  Duration: 3 days Laterality: R Quality: achy Severity: 10/10- now 5-6/10 since shot Associated symptoms: jaw pain, nausea Therapies tried: Excedrin migraine, Maxalt, Toradol shot Hx of migraines- yes.  ROS:  Neuro: +HA MSK: + neck pain  Past Medical History:  Diagnosis Date  . Anxiety   . Asthma   . Back pain   . Depression   . Fatigue   . Floaters   . GERD (gastroesophageal reflux disease)   . HTN (hypertension)   . Migraine   . Neck pain   . Pre-diabetes   . Shortness of breath   . Trouble in sleeping    BP 122/86 (BP Location: Left Arm, Patient Position: Sitting, Cuff Size: Large)   Pulse 75   Temp 98.1 F (36.7 C) (Oral)   Ht 5' 2.5" (1.588 m)   Wt 240 lb (108.9 kg)   SpO2 98%   BMI 43.20 kg/m  Gen: awake, alert, appearing stated age Eyes: PERRLA, EOMi, no injection Heart: RRR Lungs: CTAB, no accessory muscle use Abd: BS+, soft, NT, ND Neuro: CN2-12 grossly intact, fluent and goal-oriented speech, DTR's equal and symmetric in UE's and LE's MSK: 5/5 strength throughout, +right sided TTP over cervical paraspinal musculature and occipital triangle region; +R sided jaw pain Psych: Age appropriate judgment and insight, normal affect and mood  Other migraine without status migrainosus, not intractable - Plan: ketorolac (TORADOL) injection 60 mg, propranolol ER (INDERAL LA) 80 MG 24 hr capsule, eletriptan (RELPAX) 20 MG tablet  Orders as above. Stretches/exercises given again. Heat. Tylenol.  Increase dose of BB to 80 mg/d from 60 mg/d. Change triptans.  F/u in 1 mo. The pt voiced understanding and agreement to the plan.  [place], DO 05/06/18 11:32 AM

## 2018-05-06 NOTE — Progress Notes (Signed)
Pre visit review using our clinic review tool, if applicable. No additional management support is needed unless otherwise documented below in the visit note. 

## 2018-05-06 NOTE — Patient Instructions (Signed)
Heat (pad or rice pillow in microwave) over affected area, 10-15 minutes twice daily.   OK to take Tylenol 1000 mg (2 extra strength tabs) or 975 mg (3 regular strength tabs) every 6 hours as needed.  Ibuprofen 400-600 mg (2-3 over the counter strength tabs) every 6 hours as needed for pain.  EXERCISES RANGE OF MOTION (ROM) AND STRETCHING EXERCISES  These exercises may help you when beginning to rehabilitate your issue. In order to successfully resolve your symptoms, you must improve your posture. These exercises are designed to help reduce the forward-head and rounded-shoulder posture which contributes to this condition. Your symptoms may resolve with or without further involvement from your physician, physical therapist or athletic trainer. While completing these exercises, remember:   Restoring tissue flexibility helps normal motion to return to the joints. This allows healthier, less painful movement and activity.  An effective stretch should be held for at least 20 seconds, although you may need to begin with shorter hold times for comfort.  A stretch should never be painful. You should only feel a gentle lengthening or release in the stretched tissue.  Do not do any stretch or exercise that you cannot tolerate.  STRETCH- Axial Extensors  Lie on your back on the floor. You may bend your knees for comfort. Place a rolled-up hand towel or dish towel, about 2 inches in diameter, under the part of your head that makes contact with the floor.  Gently tuck your chin, as if trying to make a "double chin," until you feel a gentle stretch at the base of your head.  Hold 15-20 seconds. Repeat 2-3 times. Complete this exercise 1 time per day.   STRETCH - Axial Extension   Stand or sit on a firm surface. Assume a good posture: chest up, shoulders drawn back, abdominal muscles slightly tense, knees unlocked (if standing) and feet hip width apart.  Slowly retract your chin so your head slides  back and your chin slightly lowers. Continue to look straight ahead.  You should feel a gentle stretch in the back of your head. Be certain not to feel an aggressive stretch since this can cause headaches later.  Hold for 15-20 seconds. Repeat 2-3 times. Complete this exercise 1 time per day.  STRETCH - Cervical Side Bend   Stand or sit on a firm surface. Assume a good posture: chest up, shoulders drawn back, abdominal muscles slightly tense, knees unlocked (if standing) and feet hip width apart.  Without letting your nose or shoulders move, slowly tip your right / left ear to your shoulder until your feel a gentle stretch in the muscles on the opposite side of your neck.  Hold 15-20 seconds. Repeat 2-3 times. Complete this exercise 1-2 times per day.  STRETCH - Cervical Rotators   Stand or sit on a firm surface. Assume a good posture: chest up, shoulders drawn back, abdominal muscles slightly tense, knees unlocked (if standing) and feet hip width apart.  Keeping your eyes level with the ground, slowly turn your head until you feel a gentle stretch along the back and opposite side of your neck.  Hold 15-20 seconds. Repeat 2-3 times. Complete this exercise 1-2 times per day.  RANGE OF MOTION - Neck Circles   Stand or sit on a firm surface. Assume a good posture: chest up, shoulders drawn back, abdominal muscles slightly tense, knees unlocked (if standing) and feet hip width apart.  Gently roll your head down and around from the back of one  shoulder to the back of the other. The motion should never be forced or painful.  Repeat the motion 10-20 times, or until you feel the neck muscles relax and loosen. Repeat 2-3 times. Complete the exercise 1-2 times per day. STRENGTHENING EXERCISES - Cervical Strain and Sprain These exercises may help you when beginning to rehabilitate your injury. They may resolve your symptoms with or without further involvement from your physician, physical  therapist, or athletic trainer. While completing these exercises, remember:   Muscles can gain both the endurance and the strength needed for everyday activities through controlled exercises.  Complete these exercises as instructed by your physician, physical therapist, or athletic trainer. Progress the resistance and repetitions only as guided.  You may experience muscle soreness or fatigue, but the pain or discomfort you are trying to eliminate should never worsen during these exercises. If this pain does worsen, stop and make certain you are following the directions exactly. If the pain is still present after adjustments, discontinue the exercise until you can discuss the trouble with your clinician.  STRENGTH - Cervical Flexors, Isometric  Face a wall, standing about 6 inches away. Place a small pillow, a ball about 6-8 inches in diameter, or a folded towel between your forehead and the wall.  Slightly tuck your chin and gently push your forehead into the soft object. Push only with mild to moderate intensity, building up tension gradually. Keep your jaw and forehead relaxed.  Hold 10 to 20 seconds. Keep your breathing relaxed.  Release the tension slowly. Relax your neck muscles completely before you start the next repetition. Repeat 2-3 times. Complete this exercise 1 time per day.  STRENGTH- Cervical Lateral Flexors, Isometric   Stand about 6 inches away from a wall. Place a small pillow, a ball about 6-8 inches in diameter, or a folded towel between the side of your head and the wall.  Slightly tuck your chin and gently tilt your head into the soft object. Push only with mild to moderate intensity, building up tension gradually. Keep your jaw and forehead relaxed.  Hold 10 to 20 seconds. Keep your breathing relaxed.  Release the tension slowly. Relax your neck muscles completely before you start the next repetition. Repeat 2-3 times. Complete this exercise 1 time per  day.  STRENGTH - Cervical Extensors, Isometric   Stand about 6 inches away from a wall. Place a small pillow, a ball about 6-8 inches in diameter, or a folded towel between the back of your head and the wall.  Slightly tuck your chin and gently tilt your head back into the soft object. Push only with mild to moderate intensity, building up tension gradually. Keep your jaw and forehead relaxed.  Hold 10 to 20 seconds. Keep your breathing relaxed.  Release the tension slowly. Relax your neck muscles completely before you start the next repetition. Repeat 2-3 times. Complete this exercise 1 time per day.  POSTURE AND BODY MECHANICS CONSIDERATIONS Keeping correct posture when sitting, standing or completing your activities will reduce the stress put on different body tissues, allowing injured tissues a chance to heal and limiting painful experiences. The following are general guidelines for improved posture. Your physician or physical therapist will provide you with any instructions specific to your needs. While reading these guidelines, remember:  The exercises prescribed by your provider will help you have the flexibility and strength to maintain correct postures.  The correct posture provides the optimal environment for your joints to work. All of your  joints have less wear and tear when properly supported by a spine with good posture. This means you will experience a healthier, less painful body.  Correct posture must be practiced with all of your activities, especially prolonged sitting and standing. Correct posture is as important when doing repetitive low-stress activities (typing) as it is when doing a single heavy-load activity (lifting).  PROLONGED STANDING WHILE SLIGHTLY LEANING FORWARD When completing a task that requires you to lean forward while standing in one place for a long time, place either foot up on a stationary 2- to 4-inch high object to help maintain the best posture. When  both feet are on the ground, the low back tends to lose its slight inward curve. If this curve flattens (or becomes too large), then the back and your other joints will experience too much stress, fatigue more quickly, and can cause pain.   RESTING POSITIONS Consider which positions are most painful for you when choosing a resting position. If you have pain with flexion-based activities (sitting, bending, stooping, squatting), choose a position that allows you to rest in a less flexed posture. You would want to avoid curling into a fetal position on your side. If your pain worsens with extension-based activities (prolonged standing, working overhead), avoid resting in an extended position such as sleeping on your stomach. Most people will find more comfort when they rest with their spine in a more neutral position, neither too rounded nor too arched. Lying on a non-sagging bed on your side with a pillow between your knees, or on your back with a pillow under your knees will often provide some relief. Keep in mind, being in any one position for a prolonged period of time, no matter how correct your posture, can still lead to stiffness.  WALKING Walk with an upright posture. Your ears, shoulders, and hips should all line up. OFFICE WORK When working at a desk, create an environment that supports good, upright posture. Without extra support, muscles fatigue and lead to excessive strain on joints and other tissues.  CHAIR:  A chair should be able to slide under your desk when your back makes contact with the back of the chair. This allows you to work closely.  The chair's height should allow your eyes to be level with the upper part of your monitor and your hands to be slightly lower than your elbows.  Body position: ? Your feet should make contact with the floor. If this is not possible, use a foot rest. ? Keep your ears over your shoulders. This will reduce stress on your neck and low back.

## 2018-05-14 ENCOUNTER — Ambulatory Visit (INDEPENDENT_AMBULATORY_CARE_PROVIDER_SITE_OTHER): Payer: 59 | Admitting: Advanced Practice Midwife

## 2018-05-14 ENCOUNTER — Encounter (HOSPITAL_BASED_OUTPATIENT_CLINIC_OR_DEPARTMENT_OTHER): Payer: Self-pay

## 2018-05-14 ENCOUNTER — Encounter: Payer: Self-pay | Admitting: Advanced Practice Midwife

## 2018-05-14 ENCOUNTER — Ambulatory Visit (HOSPITAL_BASED_OUTPATIENT_CLINIC_OR_DEPARTMENT_OTHER)
Admission: RE | Admit: 2018-05-14 | Discharge: 2018-05-14 | Disposition: A | Discharge status: Home / Self Care | Payer: 59 | Source: Ambulatory Visit | Attending: Advanced Practice Midwife | Admitting: Advanced Practice Midwife

## 2018-05-14 VITALS — BP 131/77 | HR 71 | Ht 62.5 in | Wt 244.0 lb

## 2018-05-14 DIAGNOSIS — R102 Pelvic and perineal pain: Secondary | ICD-10-CM

## 2018-05-14 DIAGNOSIS — B373 Candidiasis of vulva and vagina: Secondary | ICD-10-CM | POA: Diagnosis not present

## 2018-05-14 DIAGNOSIS — N898 Other specified noninflammatory disorders of vagina: Secondary | ICD-10-CM | POA: Diagnosis not present

## 2018-05-14 DIAGNOSIS — D251 Intramural leiomyoma of uterus: Secondary | ICD-10-CM | POA: Insufficient documentation

## 2018-05-14 DIAGNOSIS — Z113 Encounter for screening for infections with a predominantly sexual mode of transmission: Secondary | ICD-10-CM

## 2018-05-14 NOTE — Progress Notes (Signed)
GYNECOLOGY ANNUAL PREVENTATIVE CARE ENCOUNTER NOTE  Subjective:   Danielle Garcia is a 27 y.o. G1P0 female here for c/o pelvic pain, primarily inLLQ.  Thinks it might be an ovarian cyst.  Not very concerned about STDs but willing to be checked for them.   Had menses two weeks ago.  Denies abnormal vaginal bleeding, discharge, pelvic pain, problems with intercourse or other gynecologic concerns.    Gynecologic History Patient's last menstrual period was 04/28/2018. Contraception: none  Trying to get pregnant   Had eval for PCOS in August Last Pap: 07/12/17. Results were: normal   Obstetric History OB History  Gravida Para Term Preterm AB Living  1         1  SAB TAB Ectopic Multiple Live Births          1    # Outcome Date GA Lbr Len/2nd Weight Sex Delivery Anes PTL Lv  1 Gravida      CS-LTranv       Past Medical History:  Diagnosis Date  . Anxiety   . Asthma   . Back pain   . Depression   . Fatigue   . Floaters   . GERD (gastroesophageal reflux disease)   . HTN (hypertension)   . Migraine   . Neck pain   . Pre-diabetes   . Shortness of breath   . Trouble in sleeping     Past Surgical History:  Procedure Laterality Date  . CESAREAN SECTION      Current Outpatient Medications on File Prior to Visit  Medication Sig Dispense Refill  . buPROPion (WELLBUTRIN XL) 150 MG 24 hr tablet Take 1 tablet (150 mg total) by mouth daily. 30 tablet 2  . cyclobenzaprine (FLEXERIL) 10 MG tablet Take 1 tablet (10 mg total) by mouth 3 (three) times daily as needed for muscle spasms. 30 tablet 0  . ibuprofen (ADVIL,MOTRIN) 800 MG tablet     . lisinopril-hydrochlorothiazide (PRINZIDE,ZESTORETIC) 20-12.5 MG tablet     . metFORMIN (GLUCOPHAGE) 500 MG tablet Take 1 tablet (500 mg total) by mouth daily with breakfast. 19 tablet 0  . mometasone (NASONEX) 50 MCG/ACT nasal spray Place 2 sprays into the nose daily. 17 g 2  . predniSONE (DELTASONE) 20 MG tablet 3 po daily for 2 days, then 2 po  daily for 2 days, then 1 po daily for 2 days    . Vitamin D, Ergocalciferol, (DRISDOL) 50000 units CAPS capsule Take 1 capsule (50,000 Units total) by mouth every 7 (seven) days. 4 capsule 0  . eletriptan (RELPAX) 20 MG tablet Take 1 tablet (20 mg total) by mouth as needed for migraine or headache. May repeat in 2 hours if headache persists or recurs. (Patient not taking: Reported on 05/14/2018) 10 tablet 0  . meloxicam (MOBIC) 15 MG tablet Take 1 tablet (15 mg total) by mouth daily. (Patient not taking: Reported on 05/14/2018) 30 tablet 0  . propranolol ER (INDERAL LA) 80 MG 24 hr capsule Take 1 capsule (80 mg total) by mouth daily. (Patient not taking: Reported on 05/14/2018) 30 capsule 1   No current facility-administered medications on file prior to visit.     No Known Allergies  Social History   Socioeconomic History  . Marital status: Single    Spouse name: Peri Jefferson  . Number of children: 1  . Years of education: Not on file  . Highest education level: Not on file  Occupational History  . Occupation: CMA    Employer: [employer]  Social Needs  . Financial resource strain: Not on file  . Food insecurity:    Worry: Not on file    Inability: Not on file  . Transportation needs:    Medical: Not on file    Non-medical: Not on file  Tobacco Use  . Smoking status: Never Smoker  . Smokeless tobacco: Never Used  Substance and Sexual Activity  . Alcohol use: No  . Drug use: No  . Sexual activity: Yes    Birth control/protection: Implant  Lifestyle  . Physical activity:    Days per week: Not on file    Minutes per session: Not on file  . Stress: Not on file  Relationships  . Social connections:    Talks on phone: Not on file    Gets together: Not on file    Attends religious service: Not on file    Active member of club or organization: Not on file    Attends meetings of clubs or organizations: Not on file    Relationship status: Not on file  . Intimate partner  violence:    Fear of current or ex partner: Not on file    Emotionally abused: Not on file    Physically abused: Not on file    Forced sexual activity: Not on file  Other Topics Concern  . Not on file  Social History Narrative  . Not on file    Family History  Problem Relation Age of Onset  . Hypertension Mother   . Heart attack Father 78  . Obesity Father   . Hypertension Maternal Grandmother   . Diabetes Maternal Grandmother   . Lung cancer Maternal Grandfather   . Diabetes Paternal Grandmother   . Allergies Daughter     The following portions of the patient's history were reviewed and updated as appropriate: allergies, current medications, past family history, past medical history, past social history, past surgical history and problem list.  Review of Systems Pertinent items are noted in HPI.   Objective:  BP 131/77   Pulse 71   Ht 5' 2.5" (1.588 m)   Wt 110.7 kg   LMP 04/28/2018   BMI 43.92 kg/m  CONSTITUTIONAL: Well-developed, well-nourished female in no acute distress.  SKIN: Skin is warm and dry. No rash noted. Not diaphoretic. No erythema. No pallor. CARDIOVASCULAR: Normal heart rate noted, regular rhythm RESPIRATORY: Effort and breath sounds normal, no problems with respiration noted.  ABDOMEN: Soft, normal bowel sounds, no distention noted.  No tenderness, rebound or guarding.  PELVIC: Normal appearing external genitalia; normal appearing vaginal mucosa and cervix.  No abnormal discharge noted.  Normal uterine size, no other palpable masses, mild to moderate tenderness of uterus and adnexae, L>R.  No acute findings however    Assessment:  Pelvic pain, mostly LLQ History of ovarian cysts.   Plan:  Will follow up results of ancillary cytology and manage accordingly. Will order pelvic US to evaluate for source of pain  Please refer to After Visit Summary for other counseling recommendations.

## 2018-05-14 NOTE — Patient Instructions (Signed)
Pelvic Pain, Female Pelvic pain is pain in your lower abdomen, below your belly button and between your hips. The pain may start suddenly (acute), keep coming back (recurring), or last a long time (chronic). Pelvic pain that lasts longer than six months is considered chronic. Pelvic pain may affect your:  Reproductive organs.  Urinary system.  Digestive tract.  Musculoskeletal system.  There are many potential causes of pelvic pain. Sometimes, the pain can be a result of digestive or urinary conditions, strained muscles or ligaments, or even reproductive conditions. Sometimes the cause of pelvic pain is not known. Follow these instructions at home:  Take over-the-counter and prescription medicines only as told by your health care provider.  Rest as told by your health care provider.  Do not have sex it if hurts.  Keep a journal of your pelvic pain. Write down: ? When the pain started. ? Where the pain is located. ? What seems to make the pain better or worse, such as food or your menstrual cycle. ? Any symptoms you have along with the pain.  Keep all follow-up visits as told by your health care provider. This is important. Contact a health care provider if:  Medicine does not help your pain.  Your pain comes back.  You have new symptoms.  You have abnormal vaginal discharge or bleeding, including bleeding after menopause.  You have a fever or chills.  You are constipated.  You have blood in your urine or stool.  You have foul-smelling urine.  You feel weak or lightheaded. Get help right away if:  You have sudden severe pain.  Your pain gets steadily worse.  You have severe pain along with fever, nausea, vomiting, or excessive sweating.  You lose consciousness. This information is not intended to replace advice given to you by your health care provider. Make sure you discuss any questions you have with your health care provider. Document Released: 05/30/2004  Document Revised: 07/28/2015 Document Reviewed: 04/23/2015 Elsevier Interactive Patient Education  2018 [place] is pain in the lower abdomen that is felt between periods.  The pain affects one side of the abdomen. The side that it affects may change from month to month.  The pain may be mild or severe.  The pain may last from minutes to hours. It does not last longer than 1-2 days.  The pain can happen with nausea and light vaginal bleeding.  [place] is common among women. It is caused by the growth and release of an egg from an ovary, and it is a natural part of the ovulation cycle. It often happens about two weeks after a woman's period ends. Follow these instructions at home: Pay attention to any changes in your condition. Take these actions to help with your pain:  Try soaking in a hot bath.  Take over-the-counter and prescription medicines only as told by your health care provider.  Keep all follow-up visits as told by your health care provider. This is important.  Contact a health care provider if:  You have very bad pain most months.  You have abdominal pain that lasts longer than 24 hours.  Your pain medicine is not helping.  You have a fever.  You have nausea or vomiting that will not go away.  You miss your period.  You have vaginal bleeding between your periods that is heavier than spotting. This information is not intended to replace advice given to you by your health care provider. Make sure you discuss  any questions you have with your health care provider. Document Released: 06/23/2002 Document Revised: 12/09/2015 Document Reviewed: 09/28/2014 Elsevier Interactive Patient Education  [employer].

## 2018-05-15 LAB — CERVICOVAGINAL ANCILLARY ONLY
CHLAMYDIA, DNA PROBE: NEGATIVE
Candida vaginitis: POSITIVE — AB
NEISSERIA GONORRHEA: NEGATIVE
TRICH (WINDOWPATH): NEGATIVE

## 2018-05-17 ENCOUNTER — Encounter (HOSPITAL_COMMUNITY): Payer: Self-pay

## 2018-05-17 ENCOUNTER — Other Ambulatory Visit: Payer: Self-pay

## 2018-05-17 ENCOUNTER — Encounter: Payer: Self-pay | Admitting: Advanced Practice Midwife

## 2018-05-17 DIAGNOSIS — B379 Candidiasis, unspecified: Secondary | ICD-10-CM

## 2018-05-17 MED ORDER — FLUCONAZOLE 100 MG PO TABS: 100.0000 mg | ORAL_TABLET | Freq: Every day | ORAL | 0 refills | Status: DC

## 2018-05-17 MED FILL — FLUCONAZOLE 100 MG TAB: 100 | 1 days supply | Qty: 1 | Fill #0

## 2018-05-18 ENCOUNTER — Other Ambulatory Visit: Payer: Self-pay | Admitting: Family Medicine

## 2018-05-18 ENCOUNTER — Encounter: Payer: Self-pay | Admitting: Family Medicine

## 2018-05-18 DIAGNOSIS — B379 Candidiasis, unspecified: Secondary | ICD-10-CM

## 2018-05-18 MED ORDER — FLUCONAZOLE 100 MG PO TABS: ORAL_TABLET | ORAL | 0 refills | Status: DC

## 2018-05-21 ENCOUNTER — Other Ambulatory Visit: Payer: Self-pay

## 2018-05-21 ENCOUNTER — Other Ambulatory Visit (INDEPENDENT_AMBULATORY_CARE_PROVIDER_SITE_OTHER): Payer: Self-pay | Admitting: Physician Assistant

## 2018-05-21 DIAGNOSIS — R7303 Prediabetes: Secondary | ICD-10-CM

## 2018-05-21 MED ORDER — LEVOCETIRIZINE DIHYDROCHLORIDE 5 MG PO TABS: 5.0000 mg | ORAL_TABLET | Freq: Every evening | ORAL | 2 refills | Status: DC

## 2018-05-21 MED FILL — LEVOCETIRIZINE 5 MG TABLET: 5 | 30 days supply | Qty: 30 | Fill #0

## 2018-05-22 ENCOUNTER — Ambulatory Visit (INDEPENDENT_AMBULATORY_CARE_PROVIDER_SITE_OTHER): Payer: 59 | Admitting: Psychology

## 2018-05-22 DIAGNOSIS — F4323 Adjustment disorder with mixed anxiety and depressed mood: Secondary | ICD-10-CM | POA: Diagnosis not present

## 2018-05-23 ENCOUNTER — Encounter: Payer: Self-pay | Admitting: Family Medicine

## 2018-05-23 ENCOUNTER — Ambulatory Visit (INDEPENDENT_AMBULATORY_CARE_PROVIDER_SITE_OTHER): Payer: 59 | Admitting: Family Medicine

## 2018-05-23 VITALS — BP 120/84 | HR 77 | Temp 98.4°F | Ht 62.0 in | Wt 244.0 lb

## 2018-05-23 DIAGNOSIS — J0100 Acute maxillary sinusitis, unspecified: Secondary | ICD-10-CM

## 2018-05-23 MED ORDER — MOMETASONE FUROATE 50 MCG/ACT NA SUSP: 2.0000 | Freq: Every day | NASAL | 2 refills | Status: DC

## 2018-05-23 MED ORDER — METHYLPREDNISOLONE ACETATE 80 MG/ML IJ SUSP
80.0000 mg | Freq: Once | INTRAMUSCULAR | Status: AC
  Administered 2018-05-23: 80 mg via INTRAMUSCULAR

## 2018-05-23 MED ORDER — FLUCONAZOLE 150 MG PO TABS: 150.0000 mg | ORAL_TABLET | Freq: Once | ORAL | 0 refills | Status: DC

## 2018-05-23 MED ORDER — AMOXICILLIN-POT CLAVULANATE 875-125 MG PO TABS: 1.0000 | ORAL_TABLET | Freq: Two times a day (BID) | ORAL | 0 refills | Status: DC

## 2018-05-23 NOTE — Addendum Note (Signed)
Addended by: Sharon Seller B on: 05/23/2018 10:33 AM   Modules accepted: Orders

## 2018-05-23 NOTE — Progress Notes (Signed)
Chief Complaint  Patient presents with  . Allergies    facial pressure    Marylou Flesher here for URI complaints.  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 Anxiety   . Asthma   . Back pain   . Depression   . Fatigue   . Floaters   . GERD (gastroesophageal reflux disease)   . HTN (hypertension)   . Migraine   . Neck pain   . Pre-diabetes   . Shortness of breath   . Trouble in sleeping     BP 120/84 (BP Location: Right Arm, Patient Position: Sitting, Cuff Size: Large)   Pulse 77   Temp 98.4 F (36.9 C) (Oral)   Ht 5\' 2"  (1.575 m)   Wt 244 lb (110.7 kg)   LMP 04/28/2018   SpO2 98%   BMI 44.63 kg/m  General: Awake, alert, appears stated age 27: AT, [place], ears patent b/l and TM's neg, +ttp over max sinuses, nares patent w/o discharge, pharynx pink and without exudates, MMM Neck: No masses or asymmetry Heart: RRR Lungs: CTAB, no accessory muscle use Psych: Age appropriate judgment and insight, normal mood and affect  Acute maxillary sinusitis, recurrence not specified - Plan: mometasone (NASONEX) 50 MCG/ACT nasal spray, amoxicillin-clavulanate (AUGMENTIN) 875-125 MG tablet, fluconazole (DIFLUCAN) 150 MG tablet  Orders as above. Continue to push fluids, practice good hand hygiene, cover mouth when coughing. F/u prn. If starting to experience fevers, shaking, or shortness of breath, seek immediate care. Pt voiced understanding and agreement to the plan.  [place], DO 05/23/18 10:22 AM

## 2018-05-24 ENCOUNTER — Other Ambulatory Visit: Payer: Self-pay | Admitting: *Deleted

## 2018-05-24 ENCOUNTER — Other Ambulatory Visit (INDEPENDENT_AMBULATORY_CARE_PROVIDER_SITE_OTHER): Payer: 59

## 2018-05-24 ENCOUNTER — Other Ambulatory Visit: Payer: Self-pay | Admitting: Family Medicine

## 2018-05-24 DIAGNOSIS — N926 Irregular menstruation, unspecified: Secondary | ICD-10-CM | POA: Diagnosis not present

## 2018-05-24 LAB — HCG, QUANTITATIVE, PREGNANCY: QUANTITATIVE HCG: 22.29 m[IU]/mL

## 2018-05-24 NOTE — Progress Notes (Signed)
Pt reportedly pregnant, reviewed medication safety for pregnancy.

## 2018-06-10 ENCOUNTER — Ambulatory Visit (INDEPENDENT_AMBULATORY_CARE_PROVIDER_SITE_OTHER): Payer: 59 | Admitting: Obstetrics & Gynecology

## 2018-06-10 ENCOUNTER — Ambulatory Visit (INDEPENDENT_AMBULATORY_CARE_PROVIDER_SITE_OTHER): Payer: 59 | Admitting: Psychology

## 2018-06-10 ENCOUNTER — Encounter: Payer: Self-pay | Admitting: Obstetrics & Gynecology

## 2018-06-10 VITALS — BP 126/55 | HR 77 | Wt 248.0 lb

## 2018-06-10 DIAGNOSIS — O200 Threatened abortion: Secondary | ICD-10-CM

## 2018-06-10 DIAGNOSIS — F4323 Adjustment disorder with mixed anxiety and depressed mood: Secondary | ICD-10-CM

## 2018-06-10 NOTE — Patient Instructions (Signed)
Vaginal Bleeding During Pregnancy, First Trimester °A small amount of bleeding (spotting) from the vagina is common in early pregnancy. Sometimes the bleeding is normal and is not a problem, and sometimes it is a sign of something serious. Be sure to tell your doctor about any bleeding from your vagina right away. °Follow these instructions at home: °· Watch your condition for any changes. °· Follow your doctor's instructions about how active you can be. °· If you are on bed rest: °? You may need to stay in bed and only get up to use the bathroom. °? You may be allowed to do some activities. °? If you need help, make plans for someone to help you. °· Write down: °? The number of pads you use each day. °? How often you change pads. °? How soaked (saturated) your pads are. °· Do not use tampons. °· Do not douche. °· Do not have sex or orgasms until your doctor says it is okay. °· If you pass any tissue from your vagina, save the tissue so you can show it to your doctor. °· Only take medicines as told by your doctor. °· Do not take aspirin because it can make you bleed. °· Keep all follow-up visits as told by your doctor. °Contact a doctor if: °· You bleed from your vagina. °· You have cramps. °· You have labor pains. °· You have a fever that does not go away after you take medicine. °Get help right away if: °· You have very bad cramps in your back or belly (abdomen). °· You pass large clots or tissue from your vagina. °· You bleed more. °· You feel light-headed or weak. °· You pass out (faint). °· You have chills. °· You are leaking fluid or have a gush of fluid from your vagina. °· You pass out while pooping (having a bowel movement). °This information is not intended to replace advice given to you by your health care provider. Make sure you discuss any questions you have with your health care provider. °Document Released: 11/17/2013 Document Revised: 12/09/2015 Document Reviewed: 03/10/2013 °Elsevier Interactive  Patient Education © 2018 Elsevier Inc. ° °

## 2018-06-10 NOTE — Progress Notes (Signed)
History:  27 y.o. G2P1 here today for eval of bleeding in 1st trimester. LMP Oct 13th. Pt reports that she took a UPT 2 weeks prev. She took the UPT due to a late cycle and nausea similar to her prev pregnancy. She reports recent vaginal bleeding. She had nausea that is persistent and has not changed since she had the bleeding.  She denies pelvic pain. The bleeding was a bit heavy initially then was assoc with old blood. No bleeding currently.    The following portions of the patient's history were reviewed and updated as appropriate: allergies, current medications, past family history, past medical history, past social history, past surgical history and problem list.  Review of Systems:  Pertinent items are noted in HPI.    Objective:  Physical Exam Blood pressure (!) 126/55, pulse 77, weight 248 lb (112.5 kg), last menstrual period 04/28/2018.  CONSTITUTIONAL: Well-developed, well-nourished female in no acute distress.  HENT:  Normocephalic, atraumatic EYES: Conjunctivae and EOM are normal. No scleral icterus.  NECK: Normal range of motion SKIN: Skin is warm and dry. No rash noted. Not diaphoretic.No pallor. [place]: Alert and oriented to person, place, and time. Normal coordination.  Abd: Soft, nontender and nondistended Pelvic: no blood in vault.    Labs and Imaging US Pelvis Transvanginal Non-ob (tv Only)  Result Date: 05/15/2018 CLINICAL DATA:  Left-sided pelvic pain for 2 weeks. EXAM: TRANSABDOMINAL AND TRANSVAGINAL ULTRASOUND OF PELVIS TECHNIQUE: Both transabdominal and transvaginal ultrasound examinations of the pelvis were performed. Transabdominal technique was performed for global imaging of the pelvis including uterus, ovaries, adnexal regions, and pelvic cul-de-sac. It was necessary to proceed with endovaginal exam following the transabdominal exam to visualize the endometrium and ovaries. COMPARISON:  04/16/2014 FINDINGS: Uterus Measurements: 7.8 x 3.8 x 4.5 cm (volume = 70  cm^3). Small intramural fibroid is seen in the right posterior corpus measuring 1.4 cm. Endometrium Thickness: 6 mm.  No focal abnormality visualized. Right ovary Measurements: 3.0 x 1.9 x 2.4 cm (volume = 7.2 cm^3). Normal appearance/no adnexal mass. Left ovary Measurements: 3.8 x 2.5 x 3.2 cm (volume = 16 cm^3). A small corpus luteum is noted. Normal appearance/no adnexal mass. Other findings No abnormal free fluid. IMPRESSION: 1.4 cm intramural fibroid in the right posterior uterine corpus. Normal appearance of both ovaries.  No adnexal mass identified. Electronically Signed   By: Earle Gell M.D.   On: 05/15/2018 08:26   US Pelvis (transabdominal Only)  Result Date: 05/15/2018 CLINICAL DATA:  Left-sided pelvic pain for 2 weeks. EXAM: TRANSABDOMINAL AND TRANSVAGINAL ULTRASOUND OF PELVIS TECHNIQUE: Both transabdominal and transvaginal ultrasound examinations of the pelvis were performed. Transabdominal technique was performed for global imaging of the pelvis including uterus, ovaries, adnexal regions, and pelvic cul-de-sac. It was necessary to proceed with endovaginal exam following the transabdominal exam to visualize the endometrium and ovaries. COMPARISON:  04/16/2014 FINDINGS: Uterus Measurements: 7.8 x 3.8 x 4.5 cm (volume = 70 cm^3). Small intramural fibroid is seen in the right posterior corpus measuring 1.4 cm. Endometrium Thickness: 6 mm.  No focal abnormality visualized. Right ovary Measurements: 3.0 x 1.9 x 2.4 cm (volume = 7.2 cm^3). Normal appearance/no adnexal mass. Left ovary Measurements: 3.8 x 2.5 x 3.2 cm (volume = 16 cm^3). A small corpus luteum is noted. Normal appearance/no adnexal mass. Other findings No abnormal free fluid. IMPRESSION: 1.4 cm intramural fibroid in the right posterior uterine corpus. Normal appearance of both ovaries.  No adnexal mass identified. Electronically Signed   By: Jenny Reichmann  Kris Hartmann M.D.   On: 05/15/2018 08:26    Assessment & Plan:  threatened AB. [place] results as  noted. +GS and yolk sac Need to r/o TAB  Need Quant HCG today and f/u in 48 hours.  F/u in 1 week or sooner prn Reviewed a SAB with pt.   Total face-to-face time with patient was 15 min.  Greater than 50% was spent in counseling and coordination of care with the patient.   Demondre Aguas L. Harraway-Smith, M.D., Cherlynn June

## 2018-06-10 NOTE — Progress Notes (Signed)
Patient stated she had some spotting over the weekend that started on Saturday. On Sunday 06-09-18 she had some light bleeding she only notice when wiping. Patient is also complaining of cramping feeling. Kathrene Alu RN   Bedside ultrasound reveals gestational sac measuring 0.654 cm consistent with 5w 2d performed by Dr. Ihor Dow. Kathrene Alu RN

## 2018-06-11 LAB — OBSTETRIC PANEL, INCLUDING HIV
ANTIBODY SCREEN: NEGATIVE
BASOS: 0 %
Basophils Absolute: 0 10*3/uL (ref 0.0–0.2)
EOS (ABSOLUTE): 0.3 10*3/uL (ref 0.0–0.4)
Eos: 4 %
HEMATOCRIT: 38.4 % (ref 34.0–46.6)
HIV SCREEN 4TH GENERATION: NONREACTIVE
Hemoglobin: 12.8 g/dL (ref 11.1–15.9)
Hepatitis B Surface Ag: NEGATIVE
Immature Grans (Abs): 0 10*3/uL (ref 0.0–0.1)
Immature Granulocytes: 0 %
Lymphocytes Absolute: 2.5 10*3/uL (ref 0.7–3.1)
Lymphs: 33 %
MCH: 30.3 pg (ref 26.6–33.0)
MCHC: 33.3 g/dL (ref 31.5–35.7)
MCV: 91 fL (ref 79–97)
MONOCYTES: 5 %
Monocytes Absolute: 0.4 10*3/uL (ref 0.1–0.9)
NEUTROS ABS: 4.5 10*3/uL (ref 1.4–7.0)
Neutrophils: 58 %
Platelets: 346 10*3/uL (ref 150–450)
RBC: 4.23 x10E6/uL (ref 3.77–5.28)
RDW: 12.1 % — AB (ref 12.3–15.4)
RPR Ser Ql: NONREACTIVE
RUBELLA: 1.38 {index} (ref >0.99)
Rh Factor: POSITIVE
WBC: 7.7 10*3/uL (ref 3.4–10.8)

## 2018-06-11 LAB — BETA HCG QUANT (REF LAB): hCG Quant: 7089 m[IU]/mL

## 2018-06-12 ENCOUNTER — Other Ambulatory Visit: Payer: 59

## 2018-06-12 DIAGNOSIS — O200 Threatened abortion: Secondary | ICD-10-CM

## 2018-06-12 NOTE — Progress Notes (Signed)
Pt was sent to 3rd floor to have Beta Quant drawn.

## 2018-06-12 NOTE — Progress Notes (Signed)
I have reviewed the chart and agree with nursing staff's documentation of this patient's encounter. Early IUP, threatened abortion.  Verita Schneiders, MD 06/12/2018 2:02 PM

## 2018-06-13 LAB — BETA HCG QUANT (REF LAB): hCG Quant: 10499 m[IU]/mL

## 2018-06-17 ENCOUNTER — Telehealth: Payer: Self-pay | Admitting: Obstetrics & Gynecology

## 2018-06-17 NOTE — Telephone Encounter (Signed)
Called pt earlier today then received a return text to call pt back. Pt has a rising but, not doubling or rising appropriately quant HCG.  I reviewed this with her. she has had no further bleeding and she reports cont'd pregnancy sx. Pt to f/u in 1 week to have a repeat US.   All questions answered.   Tyyonna Soucy L. Harraway-Smith, M.D., Cherlynn June

## 2018-06-19 ENCOUNTER — Ambulatory Visit (INDEPENDENT_AMBULATORY_CARE_PROVIDER_SITE_OTHER): Payer: 59 | Admitting: Obstetrics & Gynecology

## 2018-06-19 VITALS — BP 120/82 | HR 82 | Wt 251.0 lb

## 2018-06-19 DIAGNOSIS — O200 Threatened abortion: Secondary | ICD-10-CM | POA: Diagnosis not present

## 2018-06-19 NOTE — Progress Notes (Signed)
Bedside ultrasound performed and gestational sac measures 5 weeks 4 days. No change from previous ultrasound.

## 2018-06-19 NOTE — Progress Notes (Signed)
History:  27 y.o. G2P0 here today for f/u of threatened abortion. Pt reports still having pregnancy sx of nausea.  She denies any bleeding since her first visit. She denies pelvic pain. She had a f/u HCG 48 hours after her first level that did not rise appropriately.   The following portions of the patient's history were reviewed and updated as appropriate: allergies, current medications, past family history, past medical history, past social history, past surgical history and problem list.  Review of Systems:  Pertinent items are noted in HPI.    Objective:  Physical Exam Blood pressure 120/82, pulse 82, weight 251 lb 0.6 oz (113.9 kg), last menstrual period 04/28/2018.  CONSTITUTIONAL: Well-developed, well-nourished female in no acute distress.  HENT:  Normocephalic, atraumatic EYES: Conjunctivae and EOM are normal. No scleral icterus.  NECK: Normal range of motion SKIN: Skin is warm and dry. No rash noted. Not diaphoretic.No pallor. [place]: Alert and oriented to person, place, and time. Normal coordination.   Abd: Soft, nontender and nondistended Pelvic: Normal appearing external genitalia; normal appearing vaginal mucosa and cervix.  Normal discharge.  Small uterus, no other palpable masses, no uterine or adnexal tenderness  Labs and Imaging Ofc [place] still measures 5 weeks 2/7 days. No change from exam 1 week prev. Yolk sac still noted.    Assessment & Plan:  threatened ab  I have explained to pt that I am concerned that this is not a normal gestation. The [facility] is rising abnormally and the GS is the same size with no fetal pole.   Rec repeat qHCG. Pt would like results via telephone.     Total face-to-face time with patient was 15 min.  Greater than 50% was spent in counseling and coordination of care with the patient.   Adeel Guiffre L. Harraway-Smith, M.D., Cherlynn June

## 2018-06-20 ENCOUNTER — Telehealth: Payer: Self-pay | Admitting: Obstetrics & Gynecology

## 2018-06-20 DIAGNOSIS — O200 Threatened abortion: Secondary | ICD-10-CM

## 2018-06-20 LAB — BETA HCG QUANT (REF LAB): hCG Quant: 26960 m[IU]/mL

## 2018-06-20 MED ORDER — MISOPROSTOL 200 MCG PO TABS: ORAL_TABLET | ORAL | 0 refills | Status: DC

## 2018-06-20 NOTE — Telephone Encounter (Signed)
TC to pt to review quant HCG results. Discussed with pt all of he signs that this is not a normal gestation. Pt offered cytotec vs expectant management. Pt wants to discuss with her husband. Cytotec sent to pharmacy instructions reviewed with pt. She will send a MyChart message with her decision.  She will f/u in the ofc in ~10 days.  All questions answered.   Della Scrivener L. Harraway-Smith, M.D., Cherlynn June

## 2018-06-21 ENCOUNTER — Telehealth: Payer: Self-pay

## 2018-06-21 ENCOUNTER — Encounter: Payer: Self-pay | Admitting: Obstetrics & Gynecology

## 2018-06-21 NOTE — Telephone Encounter (Signed)
Called pt and informed her that a message was sent to her. Pt states that she hasn't seen message but after she reads message she will call the office for an appointment if needed.

## 2018-06-21 NOTE — Telephone Encounter (Signed)
-----   Message from Lavonia Drafts, MD sent at 06/21/2018 10:53 AM EST ----- Please call pt. I sent her a note this am. However,  If she is still concerned. We can certainly repeat her [place] in 1 week.   clh-S

## 2018-06-25 ENCOUNTER — Ambulatory Visit (INDEPENDENT_AMBULATORY_CARE_PROVIDER_SITE_OTHER): Payer: 59

## 2018-06-25 DIAGNOSIS — O200 Threatened abortion: Secondary | ICD-10-CM

## 2018-06-25 NOTE — Progress Notes (Signed)
Patient present for follow up ultrasound. Threatened miscarriage. DATING AND VIABILITY SONOGRAM   Danielle Garcia is a 27 y.o. year old G2P0 with LMP Patient's last menstrual period was 04/28/2018 (exact date). which would correlate to  [redacted]w[redacted]d weeks gestation.  She has regular menstrual cycles.   She is here today for a follow up ultrasound. HCG quant is still rising.   GESTATION: none    GESTATIONAL AGE AND  BIOMETRICS:  Gestational criteria: Estimated Date of Delivery: 02/02/19 by LMP now at [redacted]w[redacted]d  Previous Scans:2  GESTATIONAL SAC           3.34 cm        8-[redacted] weeks                                   Gestational sac visualized and growth noted. Now measuring 8-3 weeks.      TECHNICIAN COMMENTS:  Patient informed that the ultrasound is considered a limited obstetric ultrasound and is not intended to be a complete ultrasound exam. Patient also informed that the ultrasound is not being completed with the intent of assessing for fetal or placental anomalies or any pelvic abnormalities. Explained that the purpose of today's ultrasound is to assess for fetal heart rate. Patient acknowledges the purpose of the exam and the limitations of the study.     Kathrene Alu 06/25/2018 10:14 AM

## 2018-06-26 ENCOUNTER — Telehealth: Payer: Self-pay | Admitting: Obstetrics & Gynecology

## 2018-06-26 NOTE — Telephone Encounter (Signed)
TC to pt. She is still debating whether she wants to use cytotec vs having a D&C.   All of her questions were answered. She will call tomorrow with her decision. She was also offered an office aspiration but, she declined.   Webb Weed L. Harraway-Smith, M.D., Cherlynn June

## 2018-06-26 NOTE — Telephone Encounter (Signed)
TC to pt re f/u US. No answer. LMTCB.  Violeta Lecount L. Harraway-Smith, M.D., Cherlynn June

## 2018-06-28 ENCOUNTER — Telehealth (HOSPITAL_COMMUNITY): Payer: Self-pay

## 2018-06-28 ENCOUNTER — Telehealth: Payer: Self-pay | Admitting: Obstetrics & Gynecology

## 2018-06-28 NOTE — Telephone Encounter (Signed)
TC to pt. LMTCB.  Mishelle Hassan L. Harraway-Smith, M.D., Cherlynn June

## 2018-06-28 NOTE — Telephone Encounter (Signed)
-----   Message from Lavonia Drafts, MD sent at 06/28/2018  8:34 AM EST ----- Please schedule pt for D&E on Monday with the OR person or the 2nd call.   Thx, Clh-S

## 2018-06-28 NOTE — Telephone Encounter (Signed)
Called patient, given surgery date/time, and pre-op instructions. Arrive at [facility] at 11:30am, surgery scheduled for 1:30pm NPO after midnight, no lotion, no powder, no perfume, a little bit of deodorant is okay. Have someone available to drive you home. Patient expressed understanding, no questions, advised patient to call back if she had any questions

## 2018-07-01 ENCOUNTER — Other Ambulatory Visit: Payer: Self-pay | Admitting: Obstetrics & Gynecology

## 2018-07-01 ENCOUNTER — Encounter: Payer: Self-pay | Admitting: Family Medicine

## 2018-07-02 ENCOUNTER — Ambulatory Visit (HOSPITAL_COMMUNITY): Payer: 59 | Admitting: Certified Registered Nurse Anesthetist

## 2018-07-02 ENCOUNTER — Encounter: Payer: Self-pay | Admitting: Obstetrics and Gynecology

## 2018-07-02 ENCOUNTER — Encounter (HOSPITAL_COMMUNITY)
Admission: RE | Disposition: A | Discharge status: Home / Self Care | Payer: Self-pay | Source: Home / Self Care | Attending: Obstetrics and Gynecology

## 2018-07-02 ENCOUNTER — Ambulatory Visit (HOSPITAL_COMMUNITY)
Admission: RE | Admit: 2018-07-02 | Discharge: 2018-07-02 | Disposition: A | Discharge status: Home / Self Care | Payer: 59 | Attending: Obstetrics and Gynecology | Admitting: Obstetrics and Gynecology

## 2018-07-02 ENCOUNTER — Other Ambulatory Visit: Payer: Self-pay

## 2018-07-02 DIAGNOSIS — Z6841 Body Mass Index (BMI) 40.0 and over, adult: Secondary | ICD-10-CM | POA: Insufficient documentation

## 2018-07-02 DIAGNOSIS — J45909 Unspecified asthma, uncomplicated: Secondary | ICD-10-CM | POA: Insufficient documentation

## 2018-07-02 DIAGNOSIS — I10 Essential (primary) hypertension: Secondary | ICD-10-CM | POA: Insufficient documentation

## 2018-07-02 DIAGNOSIS — O034 Incomplete spontaneous abortion without complication: Secondary | ICD-10-CM

## 2018-07-02 DIAGNOSIS — R7303 Prediabetes: Secondary | ICD-10-CM | POA: Insufficient documentation

## 2018-07-02 DIAGNOSIS — O021 Missed abortion: Secondary | ICD-10-CM | POA: Diagnosis not present

## 2018-07-02 HISTORY — PX: DILATION AND EVACUATION: SHX1459

## 2018-07-02 LAB — BASIC METABOLIC PANEL
ANION GAP: 7 (ref 5–15)
BUN: 12 mg/dL (ref 6–20)
CO2: 23 mmol/L (ref 22–32)
Calcium: 8.8 mg/dL — ABNORMAL LOW (ref 8.9–10.3)
Chloride: 103 mmol/L (ref 98–111)
Creatinine, Ser: 0.61 mg/dL (ref 0.44–1.00)
GFR calc Af Amer: >60 mL/min (ref >60)
GFR calc non Af Amer: >60 mL/min (ref >60)
Glucose, Bld: 87 mg/dL (ref 70–99)
Potassium: 3.5 mmol/L (ref 3.5–5.1)
Sodium: 133 mmol/L — ABNORMAL LOW (ref 135–145)

## 2018-07-02 LAB — CBC
HCT: 40.1 % (ref 36.0–46.0)
Hemoglobin: 13.4 g/dL (ref 12.0–15.0)
MCH: 30.9 pg (ref 26.0–34.0)
MCHC: 33.4 g/dL (ref 30.0–36.0)
MCV: 92.6 fL (ref 80.0–100.0)
Platelets: 328 10*3/uL (ref 150–400)
RBC: 4.33 MIL/uL (ref 3.87–5.11)
RDW: 13 % (ref 11.5–15.5)
WBC: 8.1 10*3/uL (ref 4.0–10.5)
nRBC: 0 % (ref 0.0–0.2)

## 2018-07-02 SURGERY — DILATION AND EVACUATION, UTERUS
Anesthesia: Monitor Anesthesia Care | Site: Vagina

## 2018-07-02 MED ORDER — SCOPOLAMINE 1 MG/3DAYS TD PT72
MEDICATED_PATCH | TRANSDERMAL | Status: AC
  Administered 2018-07-02: 1.5 mg via TRANSDERMAL
  Filled 2018-07-02: qty 1

## 2018-07-02 MED ORDER — FENTANYL CITRATE (PF) 100 MCG/2ML IJ SOLN
INTRAMUSCULAR | Status: AC
  Filled 2018-07-02: qty 2

## 2018-07-02 MED ORDER — LACTATED RINGERS IV SOLN
INTRAVENOUS | Status: DC
  Administered 2018-07-02: 125 mL/h via INTRAVENOUS

## 2018-07-02 MED ORDER — ACETAMINOPHEN 500 MG PO TABS: 1000.0000 mg | ORAL_TABLET | Freq: Four times a day (QID) | ORAL | 0 refills | Status: DC | PRN

## 2018-07-02 MED ORDER — HYDROMORPHONE HCL 1 MG/ML IJ SOLN
INTRAMUSCULAR | Status: AC
  Filled 2018-07-02: qty 1

## 2018-07-02 MED ORDER — SUCCINYLCHOLINE CHLORIDE 200 MG/10ML IV SOSY
PREFILLED_SYRINGE | INTRAVENOUS | Status: DC | PRN
  Administered 2018-07-02: 100 mg via INTRAVENOUS

## 2018-07-02 MED ORDER — OXYCODONE HCL 5 MG PO TABS: 5.0000 mg | ORAL_TABLET | Freq: Once | ORAL | Status: DC | PRN

## 2018-07-02 MED ORDER — DEXAMETHASONE SODIUM PHOSPHATE 10 MG/ML IJ SOLN
INTRAMUSCULAR | Status: AC
  Filled 2018-07-02: qty 1

## 2018-07-02 MED ORDER — PROMETHAZINE HCL 25 MG/ML IJ SOLN: 6.2500 mg | INTRAMUSCULAR | Status: DC | PRN

## 2018-07-02 MED ORDER — PROPOFOL 10 MG/ML IV BOLUS
INTRAVENOUS | Status: DC | PRN
  Administered 2018-07-02: 200 mg via INTRAVENOUS

## 2018-07-02 MED ORDER — DEXAMETHASONE SODIUM PHOSPHATE 10 MG/ML IJ SOLN
INTRAMUSCULAR | Status: DC | PRN
  Administered 2018-07-02: 10 mg via INTRAVENOUS

## 2018-07-02 MED ORDER — KETOROLAC TROMETHAMINE 30 MG/ML IJ SOLN
INTRAMUSCULAR | Status: AC
  Filled 2018-07-02: qty 1

## 2018-07-02 MED ORDER — MIDAZOLAM HCL 5 MG/5ML IJ SOLN
INTRAMUSCULAR | Status: DC | PRN
  Administered 2018-07-02: 2 mg via INTRAVENOUS

## 2018-07-02 MED ORDER — DOXYCYCLINE HYCLATE 100 MG IV SOLR
200.0000 mg | INTRAVENOUS | Status: AC
  Administered 2018-07-02: 200 mg via INTRAVENOUS
  Filled 2018-07-02: qty 200

## 2018-07-02 MED ORDER — LACTATED RINGERS IV SOLN: INTRAVENOUS | Status: DC

## 2018-07-02 MED ORDER — ONDANSETRON HCL 4 MG/2ML IJ SOLN
INTRAMUSCULAR | Status: DC | PRN
  Administered 2018-07-02: 4 mg via INTRAVENOUS

## 2018-07-02 MED ORDER — LIDOCAINE HCL 1 % IJ SOLN
INTRAMUSCULAR | Status: DC | PRN
  Administered 2018-07-02: 20 mL

## 2018-07-02 MED ORDER — FENTANYL CITRATE (PF) 250 MCG/5ML IJ SOLN
INTRAMUSCULAR | Status: DC | PRN
  Administered 2018-07-02: 100 ug via INTRAVENOUS

## 2018-07-02 MED ORDER — ONDANSETRON HCL 4 MG/2ML IJ SOLN
INTRAMUSCULAR | Status: AC
  Filled 2018-07-02: qty 2

## 2018-07-02 MED ORDER — LIDOCAINE HCL (CARDIAC) PF 100 MG/5ML IV SOSY
PREFILLED_SYRINGE | INTRAVENOUS | Status: AC
  Filled 2018-07-02: qty 5

## 2018-07-02 MED ORDER — SCOPOLAMINE 1 MG/3DAYS TD PT72
1.0000 | MEDICATED_PATCH | Freq: Once | TRANSDERMAL | Status: DC
  Administered 2018-07-02: 1.5 mg via TRANSDERMAL

## 2018-07-02 MED ORDER — LIDOCAINE HCL 1 % IJ SOLN
INTRAMUSCULAR | Status: AC
  Filled 2018-07-02: qty 20

## 2018-07-02 MED ORDER — HYDROMORPHONE HCL 1 MG/ML IJ SOLN
0.2500 mg | INTRAMUSCULAR | Status: DC | PRN
  Administered 2018-07-02: 0.25 mg via INTRAVENOUS

## 2018-07-02 MED ORDER — MIDAZOLAM HCL 2 MG/2ML IJ SOLN
INTRAMUSCULAR | Status: AC
  Filled 2018-07-02: qty 2

## 2018-07-02 MED ORDER — LIDOCAINE 2% (20 MG/ML) 5 ML SYRINGE
INTRAMUSCULAR | Status: DC | PRN
  Administered 2018-07-02: 60 mg via INTRAVENOUS

## 2018-07-02 MED ORDER — OXYCODONE-ACETAMINOPHEN 5-325 MG PO TABS: 1.0000 | ORAL_TABLET | Freq: Four times a day (QID) | ORAL | 0 refills | Status: DC | PRN

## 2018-07-02 MED ORDER — OXYCODONE HCL 5 MG/5ML PO SOLN: 5.0000 mg | Freq: Once | ORAL | Status: DC | PRN

## 2018-07-02 MED ORDER — KETOROLAC TROMETHAMINE 30 MG/ML IJ SOLN
INTRAMUSCULAR | Status: DC | PRN
  Administered 2018-07-02: 30 mg via INTRAVENOUS

## 2018-07-02 MED ORDER — PROPOFOL 10 MG/ML IV BOLUS
INTRAVENOUS | Status: AC
  Filled 2018-07-02: qty 20

## 2018-07-02 MED ORDER — IBUPROFEN 600 MG PO TABS: 600.0000 mg | ORAL_TABLET | Freq: Four times a day (QID) | ORAL | 3 refills | Status: DC | PRN

## 2018-07-02 MED ORDER — SUCCINYLCHOLINE CHLORIDE 200 MG/10ML IV SOSY
PREFILLED_SYRINGE | INTRAVENOUS | Status: AC
  Filled 2018-07-02: qty 10

## 2018-07-02 MED FILL — OXYCODONE-ACETAMINOPHEN 5-3: 5-325 | 1 days supply | Qty: 4 | Fill #0

## 2018-07-02 SURGICAL SUPPLY — 21 items
CATH ROBINSON RED A/P 16FR (CATHETERS) ×2 IMPLANT
DECANTER SPIKE VIAL GLASS SM (MISCELLANEOUS) ×2 IMPLANT
GLOVE BIOGEL PI IND STRL 7.0 (GLOVE) ×1 IMPLANT
GLOVE BIOGEL PI IND STRL 7.5 (GLOVE) ×1 IMPLANT
GLOVE BIOGEL PI INDICATOR 7.0 (GLOVE) ×1
GLOVE BIOGEL PI INDICATOR 7.5 (GLOVE) ×1
GLOVE NEODERM STER SZ 7 (GLOVE) ×2 IMPLANT
GOWN STRL REUS W/TWL LRG LVL3 (GOWN DISPOSABLE) ×2 IMPLANT
GOWN STRL REUS W/TWL XL LVL3 (GOWN DISPOSABLE) ×2 IMPLANT
HIBICLENS CHG 4% 4OZ BTL (MISCELLANEOUS) ×2 IMPLANT
KIT BERKELEY 1ST TRIMESTER 3/8 (MISCELLANEOUS) ×2 IMPLANT
NS IRRIG 1000ML POUR BTL (IV SOLUTION) ×2 IMPLANT
PACK VAGINAL MINOR WOMEN LF (CUSTOM PROCEDURE TRAY) ×2 IMPLANT
PAD OB MATERNITY 4.3X12.25 (PERSONAL CARE ITEMS) ×2 IMPLANT
PAD PREP 24X48 CUFFED NSTRL (MISCELLANEOUS) ×2 IMPLANT
SET BERKELEY SUCTION TUBING (SUCTIONS) ×2 IMPLANT
TOWEL OR 17X24 6PK STRL BLUE (TOWEL DISPOSABLE) ×4 IMPLANT
VACURETTE 10 RIGID CVD (CANNULA) IMPLANT
VACURETTE 7MM CVD STRL WRAP (CANNULA) ×2 IMPLANT
VACURETTE 8 RIGID CVD (CANNULA) IMPLANT
VACURETTE 9 RIGID CVD (CANNULA) IMPLANT

## 2018-07-02 NOTE — Op Note (Signed)
Operative Note   07/02/2018  PRE-OP DIAGNOSIS: Incomplete abortion at approximately 8 weeks   POST-OP DIAGNOSIS: Same.  SURGEON: Surgeon(s) and Role:    * Shaka Cardin, Eduard Clos, MD - Primary  ASSISTANT: None  PROCEDURE:  Suction dilation and curettage  ANESTHESIA: General and paracervical block  ESTIMATED BLOOD LOSS: 58mL  DRAINS: I/O cath of 65mL UOP  TOTAL IV FLUIDS: per anesthesia note  SPECIMENS: products of conception to pathology  VTE PROPHYLAXIS: SCDs to the bilateral lower extremities  ANTIBIOTICS: Doxycycline 100mg  IV x 1 pre op  COMPLICATIONS: none  DISPOSITION: PACU - hemodynamically stable.  CONDITION: stable  BLOOD TYPE: A positive. Rhogam given:not applicable  FINDINGS: Exam under anesthesia revealed 6-8 week sized uterus with no masses and bilateral adnexa without masses or fullness. Necrotic appearing products of conception were seen, with gritty texture in all four quadrants.  PROCEDURE IN DETAIL:  After informed consent was obtained, the patient was taken to the operating room where anesthesia was obtained without difficulty. The patient was positioned in the dorsal lithotomy position in [place]. The patient was examined under anesthesia, with the above noted findings.  The bi-valved speculum was placed inside the patient's vagina, and the the anterior lip of the cervix was seen and grasped with the tenaculum.  A paracervical block was achieved with 14mL of 1% lidocaine and then the cervix was progressively dilated to a 19 French-Pratt dilator.  The suction was then calibrated to 34mmHg and connected to the number 7 cannula, which was then introduced with the above noted findings. A gentle curettage was done at the end and yield no products of conception.   The suction was then done one more time to remove any remaining curettage material.   Excellent hemostasis was noted, and all instruments were removed, with excellent hemostasis noted throughout.  She  was then taken out of dorsal lithotomy. The patient tolerated the procedure well.  Sponge, lap and instrument counts were correct x2.  The patient was taken to recovery room in excellent condition.  Durene Romans MD Attending Center for [employer])

## 2018-07-02 NOTE — Discharge Instructions (Addendum)
We will discuss your surgery once again in detail at your post-op visit in two to four weeks. If you havent already done so, please call to make your appointment as soon as possible.   Post Anesthesia Home Care Instructions  Activity: Get plenty of rest for the remainder of the day. A responsible individual must stay with you for 24 hours following the procedure.  For the next 24 hours, DO NOT: -Drive a car -Paediatric nurse -Drink alcoholic beverages -Take any medication unless instructed by your physician -Make any legal decisions or sign important papers.  Meals: Start with liquid foods such as gelatin or soup. Progress to regular foods as tolerated. Avoid greasy, spicy, heavy foods. If nausea and/or vomiting occur, drink only clear liquids until the nausea and/or vomiting subsides. Call your physician if vomiting continues.  Special Instructions/Symptoms: Your throat may feel dry or sore from the anesthesia or the breathing tube placed in your throat during surgery. If this causes discomfort, gargle with warm salt water. The discomfort should disappear within 24 hours.  If you had a scopolamine patch placed behind your ear for the management of post- operative nausea and/or vomiting:  1. The medication in the patch is effective for 72 hours, after which it should be removed.  Wrap patch in a tissue and discard in the trash. Wash hands thoroughly with soap and water. 2. You may remove the patch earlier than 72 hours if you experience unpleasant side effects which may include dry mouth, dizziness or visual disturbances. 3. Avoid touching the patch. Wash your hands with soap and water after contact with the patch.  DISCHARGE INSTRUCTIONS: D&C / D&E The following instructions have been prepared to help you care for yourself upon your return home.   Personal hygiene:  Use sanitary pads for vaginal drainage, not tampons.  Shower the day after your procedure.  NO tub baths,  pools or Jacuzzis for 2-3 weeks.  Wipe front to back after using the bathroom.  Activity and limitations:  Do NOT drive or operate any equipment for 24 hours. The effects of anesthesia are still present and drowsiness may result.  Do NOT rest in bed all day.  Walking is encouraged.  Walk up and down stairs slowly.  You may resume your normal activity in one to two days or as indicated by your physician.  Sexual activity: NO intercourse for at least 2 weeks after the procedure, or as indicated by your physician.  Diet: Eat a light meal as desired this evening. You may resume your usual diet tomorrow.  Return to work: You may resume your work activities in one to two days or as indicated by your doctor.  What to expect after your surgery: Expect to have vaginal bleeding/discharge for 2-3 days and spotting for up to 10 days. It is not unusual to have soreness for up to 1-2 weeks. You may have a slight burning sensation when you urinate for the first day. Mild cramps may continue for a couple of days. You may have a regular period in 2-6 weeks.  Call your doctor for any of the following:  Excessive vaginal bleeding, saturating and changing one pad every hour.  Inability to urinate 6 hours after discharge from hospital.  Pain not relieved by pain medication.  Fever of 100.4 F or greater.  Unusual vaginal discharge or odor.  Dilation and Curettage or Vacuum Curettage, Care After These instructions give you information on caring for yourself after your procedure. Your doctor  may also give you more specific instructions. Call your doctor if you have any problems or questions after your procedure. HOME CARE  Do not drive for 24 hours.  Wait 1 week before doing any activities that wear you out.  Do not stand for a long time.  Limit stair climbing to once or twice a day.  Rest often.  Continue with your usual diet.  Drink enough fluids to keep your pee (urine) clear or pale  yellow.  If you have a hard time pooping (constipation), you may:  Take a medicine to help you go poop (laxative) as told by your doctor.  Eat more fruit and bran.  Drink more fluids.  Take showers, not baths, for as long as told by your doctor.  Do not swim or use a hot tub until your doctor says it is okay.  Have someone with you for 1day after the procedure.  Do not douche, use tampons, or have sex (intercourse) until seen by your doctor  Only take medicines as told by your doctor. Do not take aspirin. It can cause bleeding.  Keep all doctor visits. GET HELP IF:  You have cramps or pain not helped by medicine.  You have new pain in the belly (abdomen).  You have a bad smelling fluid coming from your vagina.  You have a rash.  You have problems with any medicine. GET HELP RIGHT AWAY IF:   You start to bleed more than a regular period.  You have a fever.  You have chest pain.  You have trouble breathing.  You feel dizzy or feel like passing out (fainting).  You pass out.  You have pain in the tops of your shoulders.  You have vaginal bleeding with or without clumps of blood (blood clots). MAKE SURE YOU:  Understand these instructions.  Will watch your condition.  Will get help right away if you are not doing well or get worse. Document Released: 04/11/2008 Document Revised: 07/08/2013 Document Reviewed: 01/30/2013 [facility] Patient Information 2015 [place], [place]. This information is not intended to replace advice given to you by your health care provider. Make sure you discuss any questions you have with your health care provider.

## 2018-07-02 NOTE — H&P (Signed)
Obstetrics & Gynecology H&P   Date of Surgery: 07/02/2018    Primary OBGYN: [facility] Primary Care Provider: [name]  Reason for Admission: scheduled surgery  History of Present Illness: Danielle Garcia is a 27 y.o. G2P1011 (Patient's last menstrual period was 04/28/2018 (exact date).), with the above CC. PMHx is significant for HTN, migraine, GERD, depression, c-section, BMI 40s.    Patient dx with missed AB on 12/10 and eventually decided on OR management. Had some bleeding for an hour last week but no other s/s since.   ROS: A 12-point review of systems was performed and negative, except as stated in the above HPI.  OBGYN History: As per HPI. OB History  Gravida Para Term Preterm AB Living  2 1 1    0 1  SAB TAB Ectopic Multiple Live Births          1    # Outcome Date GA Lbr Len/2nd Weight Sex Delivery Anes PTL Lv  2 Current           1 Term    4139 g F CS-LTranv   LIV     Past Medical History: Past Medical History:  Diagnosis Date  . Anxiety   . Asthma   . Back pain   . Depression   . Fatigue   . Floaters   . GERD (gastroesophageal reflux disease)   . HTN (hypertension)   . Migraine   . Neck pain   . Pre-diabetes   . Shortness of breath   . Trouble in sleeping     Past Surgical History: Past Surgical History:  Procedure Laterality Date  . CESAREAN SECTION    . WISDOM TOOTH EXTRACTION      Family History:  Family History  Problem Relation Age of Onset  . Hypertension Mother   . Heart attack Father 4  . Obesity Father   . Hypertension Maternal Grandmother   . Diabetes Maternal Grandmother   . Lung cancer Maternal Grandfather   . Diabetes Paternal Grandmother   . Allergies Daughter     Social History:  Social History   Socioeconomic History  . Marital status: Married    Spouse name: Danielle Garcia  . Number of children: 1  . Years of education: Not on file  . Highest education level: Not on file  Occupational History  . Occupation:  CMA    Employer: [employer]  . Financial resource strain: Not on file  . Food insecurity:    Worry: Not on file    Inability: Not on file  . Transportation needs:    Medical: Not on file    Non-medical: Not on file  Tobacco Use  . Smoking status: Never Smoker  . Smokeless tobacco: Never Used  Substance and Sexual Activity  . Alcohol use: No  . Drug use: No  . Sexual activity: Yes    Birth control/protection: Implant  Lifestyle  . Physical activity:    Days per week: Not on file    Minutes per session: Not on file  . Stress: Not on file  Relationships  . Social connections:    Talks on phone: Not on file    Gets together: Not on file    Attends religious service: Not on file    Active member of club or organization: Not on file    Attends meetings of clubs or organizations: Not on file    Relationship status: Not on file  . Intimate partner violence:  Fear of current or ex partner: Not on file    Emotionally abused: Not on file    Physically abused: Not on file    Forced sexual activity: Not on file  Other Topics Concern  . Not on file  Social History Narrative  . Not on file    Allergy: No Known Allergies  Current Outpatient Medications: Medications Prior to Admission  Medication Sig Dispense Refill Last Dose  . buPROPion (WELLBUTRIN XL) 150 MG 24 hr tablet Take 1 tablet (150 mg total) by mouth daily. (Patient not taking: Reported on 06/10/2018) 30 tablet 2 Not Taking  . eletriptan (RELPAX) 20 MG tablet Take 1 tablet (20 mg total) by mouth as needed for migraine or headache. May repeat in 2 hours if headache persists or recurs. (Patient not taking: Reported on 06/10/2018) 10 tablet 0 Not Taking  . levocetirizine (XYZAL) 5 MG tablet Take 1 tablet (5 mg total) by mouth every evening. (Patient not taking: Reported on 06/10/2018) 30 tablet 2 Not Taking  . misoprostol (CYTOTEC) 200 MCG tablet Place four tablets in the vagina. Repeat in 8-12 hours if no  bleeding noted. (Patient not taking: Reported on 07/01/2018) 8 tablet 0 Not Taking at Unknown time  . propranolol ER (INDERAL LA) 80 MG 24 hr capsule Take 1 capsule (80 mg total) by mouth daily. (Patient not taking: Reported on 06/10/2018) 30 capsule 1 Not Taking     Hospital Medications: Current Facility-Administered Medications  Medication Dose Route Frequency Provider Last Rate Last Dose  . doxycycline (VIBRAMYCIN) 200 mg in dextrose 5 % 250 mL IVPB  200 mg Intravenous On Call to OR Lavonia Drafts, MD      . lactated ringers infusion   Intravenous Continuous Montez Hageman, MD      . lactated ringers infusion   Intravenous Continuous Lavonia Drafts, MD 125 mL/hr at 07/02/18 1220 125 mL/hr at 07/02/18 1220  . scopolamine (TRANSDERM-SCOP) 1 MG/3DAYS 1.5 mg  1 patch Transdermal Once Montez Hageman, MD   1.5 mg at 07/02/18 1215     Physical Exam:  Current Vital Signs 24h Vital Sign Ranges  T 98.3 F (36.8 C) Temp  Avg: 98.3 F (36.8 C)  Min: 98.3 F (36.8 C)  Max: 98.3 F (36.8 C)  BP 133/87 BP  Min: 133/87  Max: 133/87  HR 83 Pulse  Avg: 83  Min: 83  Max: 83  RR 16 Resp  Avg: 16  Min: 16  Max: 16  SaO2 100 % Room Air SpO2  Avg: 100 %  Min: 100 %  Max: 100 %       24 Hour I/O Current Shift I/O  Time Ins Outs No intake/output data recorded. No intake/output data recorded.   Patient Vitals for the past 24 hrs:  BP Temp Temp src Pulse Resp SpO2  07/02/18 1216 133/87 98.3 F (36.8 C) Oral 83 16 100 %    Body mass index is 44.29 kg/m. General appearance: Well nourished, well developed female in no acute distress.  Neck:  Supple, normal appearance, and no thyromegaly  Cardiovascular: S1, S2 normal, no murmur, rub or gallop, regular rate and rhythm Respiratory:  Clear to auscultation bilateral. Normal respiratory effort Abdomen: positive bowel sounds and no masses, hernias; diffusely non tender to palpation, non distended Neuro/Psych:  Normal mood and affect.   Skin:  Warm and dry.  Extremities: no clubbing, cyanosis, or edema.   Laboratory: Recent Labs  Lab 07/02/18 1205  WBC 8.1  HGB 13.4  HCT 40.1  PLT 328   Recent Labs  Lab 07/02/18 1205  NA 133*  K 3.5  CL 103  CO2 23  BUN 12  CREATININE 0.61  CALCIUM 8.8*  GLUCOSE 87   A pos  Imaging:  Bedside u/s with GS in uterus approximately 3cm  Assessment: Danielle Garcia is a 27 y.o. G2P1011 with incomplete AB, pt stable Plan: D/w her re: options and she'd like to proceed with OR management. Can proceed when OR is ready  Durene Romans MD Attending Center for [facility])

## 2018-07-02 NOTE — Anesthesia Procedure Notes (Signed)
Procedure Name: Intubation Date/Time: 07/02/2018 1:39 PM Performed by: Talbot Grumbling, CRNA Pre-anesthesia Checklist: Patient identified, Emergency Drugs available, Suction available and Patient being monitored Patient Re-evaluated:Patient Re-evaluated prior to induction Oxygen Delivery Method: Circle system utilized Preoxygenation: Pre-oxygenation with 100% oxygen Induction Type: IV induction Ventilation: Mask ventilation without difficulty Laryngoscope Size: Mac and 4 Grade View: Grade I Tube type: Oral Tube size: 7.0 mm Number of attempts: 1 Airway Equipment and Method: Stylet Placement Confirmation: ETT inserted through vocal cords under direct vision,  positive ETCO2 and breath sounds checked- equal and bilateral Secured at: 22 cm Tube secured with: Tape Dental Injury: Teeth and Oropharynx as per pre-operative assessment

## 2018-07-02 NOTE — Anesthesia Preprocedure Evaluation (Addendum)
Anesthesia Evaluation  Patient identified by MRN, date of birth, ID band Patient awake    Reviewed: Allergy & Precautions, NPO status , Patient's Chart, lab work & pertinent test results  Airway Mallampati: II  TM Distance: >3 FB Neck ROM: Full    Dental no notable dental hx.  Unable to remove piercing to right ear:   Pulmonary asthma (Childhood) ,    Pulmonary exam normal breath sounds clear to auscultation       Cardiovascular hypertension, Normal cardiovascular exam Rhythm:Regular Rate:Normal  ECG: SR, rate 70   Neuro/Psych  Headaches, PSYCHIATRIC DISORDERS Anxiety Depression    GI/Hepatic Neg liver ROS, GERD  Controlled,  Endo/Other  Morbid obesity  Renal/GU negative Renal ROS     Musculoskeletal Back pain   Abdominal (+) + obese,   Peds  Hematology negative hematology ROS (+)   Anesthesia Other Findings MISSED AB  Reproductive/Obstetrics 8 wga                           Anesthesia Physical Anesthesia Plan  ASA: III  Anesthesia Plan: General   Post-op Pain Management:    Induction: Intravenous  PONV Risk Score and Plan: 3 and Ondansetron, Treatment may vary due to age or medical condition, Dexamethasone and Midazolam  Airway Management Planned: Oral ETT  Additional Equipment:   Intra-op Plan:   Post-operative Plan: Extubation in OR  Informed Consent: I have reviewed the patients History and Physical, chart, labs and discussed the procedure including the risks, benefits and alternatives for the proposed anesthesia with the patient or authorized representative who has indicated his/her understanding and acceptance.   Dental advisory given  Plan Discussed with: CRNA  Anesthesia Plan Comments:       Anesthesia Quick Evaluation

## 2018-07-02 NOTE — Anesthesia Postprocedure Evaluation (Signed)
Anesthesia Post Note  Patient: Danielle Garcia  Procedure(s) Performed: DILATATION AND EVACUATION (N/A Vagina )     Patient location during evaluation: PACU Anesthesia Type: General Level of consciousness: awake and alert Pain management: pain level controlled Vital Signs Assessment: post-procedure vital signs reviewed and stable Respiratory status: spontaneous breathing, nonlabored ventilation, respiratory function stable and patient connected to nasal cannula oxygen Cardiovascular status: blood pressure returned to baseline and stable Postop Assessment: no apparent nausea or vomiting Anesthetic complications: no    Last Vitals:  Vitals:   07/02/18 1445 07/02/18 1548  BP: (!) 116/59 135/71  Pulse: 84 74  Resp: (!) 21 18  Temp:  36.4 C  SpO2: 96% 100%    Last Pain:  Vitals:   07/02/18 1445  TempSrc:   PainSc: 4    Pain Goal: Patients Stated Pain Goal: 4 (07/02/18 1445)               Thurmond Butts P Ellender

## 2018-07-02 NOTE — Transfer of Care (Signed)
Immediate Anesthesia Transfer of Care Note  Patient: Danielle Garcia  Procedure(s) Performed: DILATATION AND EVACUATION (N/A Vagina )  Patient Location: PACU  Anesthesia Type:General  Level of Consciousness: awake, alert  and oriented  Airway & Oxygen Therapy: Patient Spontanous Breathing and Patient connected to nasal cannula oxygen  Post-op Assessment: Report given to RN and Post -op Vital signs reviewed and stable  Post vital signs: Reviewed and stable  Last Vitals:  Vitals Value Taken Time  BP    Temp    Pulse 112 07/02/2018  2:11 PM  Resp 19 07/02/2018  2:11 PM  SpO2 100 % 07/02/2018  2:11 PM  Vitals shown include unvalidated device data.  Last Pain:  Vitals:   07/02/18 1216  TempSrc: Oral  PainSc: 0-No pain      Patients Stated Pain Goal: 4 (22/29/79 8921)  Complications: No apparent anesthesia complications

## 2018-07-03 ENCOUNTER — Ambulatory Visit (INDEPENDENT_AMBULATORY_CARE_PROVIDER_SITE_OTHER): Payer: 59 | Admitting: Psychology

## 2018-07-03 ENCOUNTER — Encounter (HOSPITAL_COMMUNITY): Payer: Self-pay | Admitting: Obstetrics and Gynecology

## 2018-07-03 DIAGNOSIS — F4323 Adjustment disorder with mixed anxiety and depressed mood: Secondary | ICD-10-CM | POA: Diagnosis not present

## 2018-07-04 ENCOUNTER — Other Ambulatory Visit: Payer: Self-pay | Admitting: Obstetrics & Gynecology

## 2018-07-04 ENCOUNTER — Telehealth: Payer: Self-pay

## 2018-07-04 DIAGNOSIS — Z9889 Other specified postprocedural states: Secondary | ICD-10-CM

## 2018-07-04 MED ORDER — OXYCODONE-ACETAMINOPHEN 5-325 MG PO TABS: 1.0000 | ORAL_TABLET | Freq: Four times a day (QID) | ORAL | 0 refills | Status: DC | PRN

## 2018-07-04 NOTE — Telephone Encounter (Signed)
Patient came to office today complaining that she is still cramping and having pain.  Patient requests note for work and requesting more pain medication.   Informed patient I will route to provider to see if she is willing to refill pain medication. I recommend that she take the 800mg  of ibuprofen every 6-8 hours to help with cramping.   Patient also complaining of sore throat where she was intubated during surgery. Will route to provider for any recommendations.

## 2018-07-05 ENCOUNTER — Encounter (HOSPITAL_BASED_OUTPATIENT_CLINIC_OR_DEPARTMENT_OTHER): Payer: Self-pay | Admitting: Emergency Medicine

## 2018-07-05 ENCOUNTER — Emergency Department (HOSPITAL_BASED_OUTPATIENT_CLINIC_OR_DEPARTMENT_OTHER)
Admission: EM | Admit: 2018-07-05 | Discharge: 2018-07-06 | Disposition: A | Discharge status: Home / Self Care | Payer: 59 | Attending: Emergency Medicine | Admitting: Emergency Medicine

## 2018-07-05 ENCOUNTER — Other Ambulatory Visit: Payer: Self-pay

## 2018-07-05 DIAGNOSIS — F419 Anxiety disorder, unspecified: Secondary | ICD-10-CM | POA: Insufficient documentation

## 2018-07-05 DIAGNOSIS — F329 Major depressive disorder, single episode, unspecified: Secondary | ICD-10-CM | POA: Insufficient documentation

## 2018-07-05 DIAGNOSIS — Z8759 Personal history of other complications of pregnancy, childbirth and the puerperium: Secondary | ICD-10-CM | POA: Insufficient documentation

## 2018-07-05 DIAGNOSIS — N939 Abnormal uterine and vaginal bleeding, unspecified: Secondary | ICD-10-CM | POA: Diagnosis not present

## 2018-07-05 DIAGNOSIS — I10 Essential (primary) hypertension: Secondary | ICD-10-CM | POA: Insufficient documentation

## 2018-07-05 DIAGNOSIS — Z9889 Other specified postprocedural states: Secondary | ICD-10-CM

## 2018-07-05 NOTE — ED Triage Notes (Signed)
Pt states she had a D&C on Tuesday  Pt states tonight she started cramping and has passed three large blood clots  Pt states the pain is radiating down into her legs

## 2018-07-06 DIAGNOSIS — I10 Essential (primary) hypertension: Secondary | ICD-10-CM | POA: Diagnosis not present

## 2018-07-06 DIAGNOSIS — F419 Anxiety disorder, unspecified: Secondary | ICD-10-CM | POA: Diagnosis not present

## 2018-07-06 DIAGNOSIS — Z8759 Personal history of other complications of pregnancy, childbirth and the puerperium: Secondary | ICD-10-CM | POA: Diagnosis not present

## 2018-07-06 DIAGNOSIS — N939 Abnormal uterine and vaginal bleeding, unspecified: Secondary | ICD-10-CM | POA: Diagnosis not present

## 2018-07-06 DIAGNOSIS — F329 Major depressive disorder, single episode, unspecified: Secondary | ICD-10-CM | POA: Diagnosis not present

## 2018-07-06 LAB — CBC WITH DIFFERENTIAL/PLATELET
ABS IMMATURE GRANULOCYTES: 0.02 10*3/uL (ref 0.00–0.07)
Basophils Absolute: 0 10*3/uL (ref 0.0–0.1)
Basophils Relative: 0 %
Eosinophils Absolute: 0.1 10*3/uL (ref 0.0–0.5)
Eosinophils Relative: 1 %
HEMATOCRIT: 34.4 % — AB (ref 36.0–46.0)
HEMOGLOBIN: 10.9 g/dL — AB (ref 12.0–15.0)
Immature Granulocytes: 0 %
Lymphocytes Relative: 34 %
Lymphs Abs: 3.1 10*3/uL (ref 0.7–4.0)
MCH: 30 pg (ref 26.0–34.0)
MCHC: 31.7 g/dL (ref 30.0–36.0)
MCV: 94.8 fL (ref 80.0–100.0)
Monocytes Absolute: 0.7 10*3/uL (ref 0.1–1.0)
Monocytes Relative: 8 %
NEUTROS ABS: 5.3 10*3/uL (ref 1.7–7.7)
Neutrophils Relative %: 57 %
Platelets: 289 10*3/uL (ref 150–400)
RBC: 3.63 MIL/uL — ABNORMAL LOW (ref 3.87–5.11)
RDW: 13 % (ref 11.5–15.5)
WBC: 9.3 10*3/uL (ref 4.0–10.5)
nRBC: 0 % (ref 0.0–0.2)

## 2018-07-06 LAB — BASIC METABOLIC PANEL
Anion gap: 5 (ref 5–15)
BUN: 19 mg/dL (ref 6–20)
CO2: 26 mmol/L (ref 22–32)
Calcium: 8.4 mg/dL — ABNORMAL LOW (ref 8.9–10.3)
Chloride: 106 mmol/L (ref 98–111)
Creatinine, Ser: 0.89 mg/dL (ref 0.44–1.00)
GFR calc Af Amer: >60 mL/min (ref >60)
GFR calc non Af Amer: >60 mL/min (ref >60)
Glucose, Bld: 115 mg/dL — ABNORMAL HIGH (ref 70–99)
Potassium: 3.5 mmol/L (ref 3.5–5.1)
Sodium: 137 mmol/L (ref 135–145)

## 2018-07-06 MED ORDER — TRAMADOL HCL 50 MG PO TABS: 50.0000 mg | ORAL_TABLET | Freq: Four times a day (QID) | ORAL | 0 refills | Status: DC | PRN

## 2018-07-06 NOTE — ED Notes (Signed)
ED Provider at bedside. 

## 2018-07-06 NOTE — ED Provider Notes (Signed)
[facility] EMERGENCY DEPARTMENT Provider Note   CSN: [id number] Arrival date & time: 07/05/18  2339     History   Chief Complaint Chief Complaint  Patient presents with  . Abdominal Pain    HPI Danielle Garcia is a 27 y.o. female.  Patient is a 27 year old female with past medical history of hypertension, anxiety, depression.  She presents today for evaluation of vaginal bleeding.  She reports having a D&C performed 2 days ago at [facility].  This evening she began having some lower abdominal cramping, then passed 3 golf ball sized clots.  She denies any fevers or chills.  The history is provided by the patient.  Abdominal Pain   This is a new problem. The current episode started 3 to 5 hours ago. The problem occurs constantly. The problem has not changed since onset.The pain is located in the suprapubic region. The quality of the pain is cramping. The pain is moderate. Pertinent negatives include fever. Nothing aggravates the symptoms. Nothing relieves the symptoms.    Past Medical History:  Diagnosis Date  . Anxiety   . Asthma   . Back pain   . Depression   . Fatigue   . Floaters   . GERD (gastroesophageal reflux disease)   . HTN (hypertension)   . Migraine   . Neck pain   . Pre-diabetes   . Shortness of breath   . Trouble in sleeping     Patient Active Problem List   Diagnosis Date Noted  . Other fatigue 02/05/2018  . Shortness of breath on exertion 02/05/2018  . Migraine 02/05/2018  . Essential hypertension 02/05/2018  . Anxiety and depression 11/26/2017  . 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 CESAREAN SECTION    . DILATION AND EVACUATION N/A 07/02/2018   Procedure: DILATATION AND EVACUATION;  Surgeon: Aletha Halim, MD;  Location: [facility];  Service: Gynecology;  Laterality: N/A;  . WISDOM TOOTH EXTRACTION       OB History    Gravida  2   Para  1   Term  1    Preterm      AB  0   Living  1     SAB      TAB      Ectopic      Multiple      Live Births  1            Home Medications    Prior to Admission medications   Medication Sig Start Date End Date Taking? Authorizing Provider  acetaminophen (TYLENOL) 500 MG tablet Take 2 tablets (1,000 mg total) by mouth every 6 (six) hours as needed for moderate pain or fever. 07/02/18   Aletha Halim, MD  buPROPion (WELLBUTRIN XL) 150 MG 24 hr tablet Take 1 tablet (150 mg total) by mouth daily. Patient not taking: Reported on 06/10/2018 11/26/17   [name], DO  eletriptan (RELPAX) 20 MG tablet Take 1 tablet (20 mg total) by mouth as needed for migraine or headache. May repeat in 2 hours if headache persists or recurs. Patient not taking: Reported on 06/10/2018 05/06/18   [name], DO  ibuprofen (ADVIL,MOTRIN) 600 MG tablet Take 1 tablet (600 mg total) by mouth every 6 (six) hours as needed. 07/02/18   Aletha Halim, MD  levocetirizine (XYZAL) 5 MG tablet Take 1 tablet (5 mg total) by mouth every evening. Patient not taking: Reported  on 06/10/2018 05/21/18   [name], DO  oxyCODONE-acetaminophen (PERCOCET/ROXICET) 5-325 MG tablet Take 1 tablet by mouth every 6 (six) hours as needed for severe pain. 07/04/18   Lavonia Drafts, MD  propranolol ER (INDERAL LA) 80 MG 24 hr capsule Take 1 capsule (80 mg total) by mouth daily. Patient not taking: Reported on 06/10/2018 05/06/18   [name], DO    Family History Family History  Problem Relation Age of Onset  . Hypertension Mother   . Heart attack Father 53  . Obesity Father   . Hypertension Maternal Grandmother   . Diabetes Maternal Grandmother   . Lung cancer Maternal Grandfather   . Diabetes Paternal Grandmother   . Allergies Daughter     Social History Social History   Tobacco Use  . Smoking status: Never Smoker  . Smokeless tobacco: Never Used  Substance  Use Topics  . Alcohol use: No  . Drug use: No     Allergies   Patient has no known allergies.   Review of Systems Review of Systems  Constitutional: Negative for fever.  Gastrointestinal: Positive for abdominal pain.  All other systems reviewed and are negative.    Physical Exam Updated Vital Signs BP 115/71 (BP Location: Left Arm)   Pulse 90   Temp 98.5 F (36.9 C) (Oral)   Resp 18   LMP 04/28/2018 (Exact Date)   SpO2 100%   Breastfeeding Unknown Comment: Had D&C on Tuesday  Physical Exam Vitals signs and nursing note reviewed.  Constitutional:      General: She is not in acute distress.    Appearance: She is well-developed. She is not diaphoretic.  HENT:     Head: Normocephalic and atraumatic.  Neck:     Musculoskeletal: Normal range of motion and neck supple.  Cardiovascular:     Rate and Rhythm: Normal rate and regular rhythm.     Heart sounds: No murmur. No friction rub. No gallop.   Pulmonary:     Effort: Pulmonary effort is normal. No respiratory distress.     Breath sounds: Normal breath sounds. No wheezing.  Abdominal:     General: Bowel sounds are normal. There is no distension.     Palpations: Abdomen is soft.     Tenderness: There is no abdominal tenderness.  Musculoskeletal: Normal range of motion.  Skin:    General: Skin is warm and dry.  Neurological:     Mental Status: She is alert and oriented to person, place, and time.      ED Treatments / Results  Labs (all labs ordered are listed, but only abnormal results are displayed) Labs Reviewed  BASIC METABOLIC PANEL  CBC WITH DIFFERENTIAL/PLATELET    EKG None  Radiology No results found.  Procedures Procedures (including critical care time)  Medications Ordered in ED Medications - No data to display   Initial Impression / Assessment and Plan / ED Course  I have reviewed the triage vital signs and the nursing notes.  Pertinent labs & imaging results that were available during  my care of the patient were reviewed by me and considered in my medical decision making (see chart for details).  Patient presents here with lower abdominal cramping and passing clots.  She is 3 days status post D&C.  Her hemoglobin today is 11, down from 13.7.  Speculum exam reveals a small clot in the vaginal vault, however no other abnormalities.  This was discussed with Dr. Kennon Rounds from OB/GYN.  She agrees  with my assessment that the patient is appropriate for discharge and outpatient follow-up.  Final Clinical Impressions(s) / ED Diagnoses   Final diagnoses:  None    ED Discharge Orders    None       Veryl Speak, MD 07/06/18 [phone]

## 2018-07-06 NOTE — Discharge Instructions (Addendum)
Tramadol as prescribed as needed for pain.  Follow-up with your OB/GYN in the next few days.  Go to [facility] if your bleeding worsens or you develop severe abdominal pain, high fever, difficulty breathing, dizziness, or other new and concerning symptoms.

## 2018-07-15 ENCOUNTER — Ambulatory Visit (INDEPENDENT_AMBULATORY_CARE_PROVIDER_SITE_OTHER): Payer: 59 | Admitting: Obstetrics & Gynecology

## 2018-07-15 ENCOUNTER — Encounter: Payer: Self-pay | Admitting: Obstetrics & Gynecology

## 2018-07-15 VITALS — BP 129/70 | HR 73 | Wt 250.0 lb

## 2018-07-15 DIAGNOSIS — O021 Missed abortion: Secondary | ICD-10-CM

## 2018-07-15 NOTE — Progress Notes (Signed)
History:  27 y.o. G2P1001 here today for 2 week post op check following a tD&C for a missed AB. She reports some mild cramping requiring occ motrin. She last took NSAIDS 3 days. Perv. She reports very light spotting.   The following portions of the patient's history were reviewed and updated as appropriate: allergies, current medications, past family history, past medical history, past social history, past surgical history and problem list.  Review of Systems:  Pertinent items are noted in HPI.    Objective:  Physical Exam Blood pressure 129/70, pulse 73, weight 250 lb (113.4 kg), last menstrual period 04/28/2018, not currently breastfeeding.  CONSTITUTIONAL: Well-developed, well-nourished female in no acute distress.  HENT:  Normocephalic, atraumatic EYES: Conjunctivae and EOM are normal. No scleral icterus.  NECK: Normal range of motion SKIN: Skin is warm and dry. No rash noted. Not diaphoretic.No pallor. [place]: Alert and oriented to person, place, and time. Normal coordination.  Abd: Soft, nontender and nondistended Pelvic: not indicated  Labs and Imaging 07/02/2018 Diagnosis Products of Conception - CHORIONIC VILLI CONSISTENT WITH PRODUCTS OF CONCEPTION.  Assessment & Plan:  2 week post op check. Pt doing well.   Pt declines contraception  Rec continued PNV  F/u for annual in 1 year or sooner prn  Alessandro Griep L. Harraway-Smith, M.D., Cherlynn June

## 2018-07-15 NOTE — Progress Notes (Signed)
Patient post op D & E - 13 days post op. Kathrene Alu RN

## 2018-08-05 ENCOUNTER — Telehealth: Payer: Self-pay

## 2018-08-05 MED ORDER — VALACYCLOVIR HCL 1 G PO TABS: ORAL_TABLET | ORAL | 1 refills | Status: DC

## 2018-08-05 MED FILL — valACYclovir HCL 1 GM TABS: 1 | 30 days supply | Qty: 30 | Fill #0

## 2018-08-05 NOTE — Telephone Encounter (Signed)
Patient would like refill on her valtrex  Please advise

## 2018-08-07 ENCOUNTER — Telehealth: Payer: Self-pay | Admitting: *Deleted

## 2018-08-07 NOTE — Telephone Encounter (Signed)
Received FMLA/STD paperwork from [employer]; completed as much as possible; forwarded to provider/SLS

## 2018-08-08 ENCOUNTER — Ambulatory Visit: Payer: 59 | Admitting: Psychology

## 2018-08-16 NOTE — Telephone Encounter (Signed)
Faxed with confirmation to [employer] on 08/09/18//SLS

## 2018-08-19 ENCOUNTER — Ambulatory Visit (INDEPENDENT_AMBULATORY_CARE_PROVIDER_SITE_OTHER): Payer: 59 | Admitting: Family Medicine

## 2018-08-19 ENCOUNTER — Encounter: Payer: Self-pay | Admitting: Family Medicine

## 2018-08-19 VITALS — BP 128/80 | HR 68 | Temp 98.4°F | Ht 63.0 in | Wt 253.0 lb

## 2018-08-19 DIAGNOSIS — G43809 Other migraine, not intractable, without status migrainosus: Secondary | ICD-10-CM | POA: Diagnosis not present

## 2018-08-19 MED ORDER — KETOROLAC TROMETHAMINE 60 MG/2ML IM SOLN
60.0000 mg | Freq: Once | INTRAMUSCULAR | Status: AC
  Administered 2018-08-19: 60 mg via INTRAMUSCULAR

## 2018-08-19 MED ORDER — ELETRIPTAN HYDROBROMIDE 20 MG PO TABS: 20.0000 mg | ORAL_TABLET | ORAL | 0 refills | Status: DC | PRN

## 2018-08-19 NOTE — Progress Notes (Signed)
Chief Complaint  Patient presents with  . Headache     Danielle Garcia is a 28 y.o. female here for evaluation of an acute headache.  Duration: 1 week Laterality: right Quality: Currently dull Severity: 3/10 Associated symptoms: Nausea, sensitivity to light Therapies tried: Tylenol Hx of migraines- yes.  ROS:  Neuro: +HA  Past Medical History:  Diagnosis Date  . Anxiety   . Asthma   . Back pain   . Depression   . Fatigue   . Floaters   . GERD (gastroesophageal reflux disease)   . HTN (hypertension)   . Migraine   . Neck pain   . Pre-diabetes   . Shortness of breath   . Trouble in sleeping     BP 128/80 (BP Location: Left Arm, Patient Position: Sitting, Cuff Size: Large)   Pulse 68   Temp 98.4 F (36.9 C) (Oral)   Ht 5\' 3"  (1.6 m)   Wt 253 lb 0.6 oz (114.8 kg)   SpO2 98%   BMI 44.82 kg/m  Gen: awake, alert, appearing stated age Eyes: PERRLA, EOMi, no injection Lungs: no accessory muscle use Neuro: CN2-12 grossly intact, fluent and goal-oriented speech, DTR's equal and symmetric in UE's and LE's MSK: 5/5 strength throughout, no TTP over cervical paraspinal musculature or occipital triangle region Psych: Age appropriate judgment and insight, normal affect and mood  Other migraine without status migrainosus, not intractable - Plan: ketorolac (TORADOL) injection 60 mg, eletriptan (RELPAX) 20 MG tablet  Less frequency of headaches, will hold off on prophylactic medication.  Abortive therapy reordered.  Toradol injection today.  No red flag signs/symptoms. F/u prn. The pt voiced understanding and agreement to the plan.  [place], DO 08/19/18 12:16 PM

## 2018-08-19 NOTE — Patient Instructions (Signed)
Use the eletriptan for migraine type headaches only.   OK to take Tylenol 1000 mg (2 extra strength tabs) or 975 mg (3 regular strength tabs) every 6 hours as needed.  Ibuprofen 400-600 mg (2-3 over the counter strength tabs) every 6 hours as needed for pain.  Let us know if you need anything.

## 2018-08-20 ENCOUNTER — Telehealth: Payer: Self-pay

## 2018-08-20 NOTE — Telephone Encounter (Signed)
Patient contacted the office requesting to try Qsymia for weight loss. She would like this sent to [facility] downstairs. Routed to PCP.

## 2018-08-21 ENCOUNTER — Telehealth: Payer: Self-pay

## 2018-08-21 MED ORDER — PHENTERMINE-TOPIRAMATE ER 3.75-23 MG PO CP24: 1.0000 | ORAL_CAPSULE | Freq: Every day | ORAL | 0 refills | Status: DC

## 2018-08-21 NOTE — Telephone Encounter (Signed)
PA initiated via Covermymeds; KEY: [id number]. Awaiting determination.

## 2018-08-21 NOTE — Addendum Note (Signed)
Addended by: Ames Coupe on: 08/21/2018 11:13 AM   Modules accepted: Orders

## 2018-08-21 NOTE — Telephone Encounter (Signed)
Discussed w pt. Will call in. She knows to avoid pregnancy on this. 1 mo of lowest dose and will increase if she is doing well.

## 2018-08-23 ENCOUNTER — Other Ambulatory Visit: Payer: Self-pay | Admitting: Family Medicine

## 2018-08-23 MED ORDER — METFORMIN HCL ER 500 MG PO TB24: 500.0000 mg | ORAL_TABLET | Freq: Every day | ORAL | 2 refills | Status: DC

## 2018-08-23 MED FILL — metFORMIN HCL ER 500 MG TB2: 500 | 30 days supply | Qty: 30 | Fill #0

## 2018-08-23 NOTE — Progress Notes (Signed)
Spoke w pt about recent issues of increased urination and early satiety that was improved when she was placed on Metformin in past. Had diarrhea, will try XR version. Called in.

## 2018-08-26 MED FILL — QSYMIA 3.75 MG-23 MG CAP: 3.75-23 | 14 days supply | Qty: 14 | Fill #0

## 2018-08-26 NOTE — Telephone Encounter (Signed)
PA approved. Effective 08/30/2018 to 11/27/2018.

## 2018-08-29 ENCOUNTER — Ambulatory Visit (INDEPENDENT_AMBULATORY_CARE_PROVIDER_SITE_OTHER): Payer: Commercial Managed Care - PPO | Admitting: Physician Assistant

## 2018-08-29 ENCOUNTER — Encounter (INDEPENDENT_AMBULATORY_CARE_PROVIDER_SITE_OTHER): Payer: Self-pay | Admitting: Physician Assistant

## 2018-08-29 VITALS — BP 111/73 | HR 68 | Ht 63.0 in | Wt 248.0 lb

## 2018-08-29 DIAGNOSIS — R7303 Prediabetes: Secondary | ICD-10-CM

## 2018-08-29 DIAGNOSIS — Z6841 Body Mass Index (BMI) 40.0 and over, adult: Secondary | ICD-10-CM

## 2018-08-29 NOTE — Progress Notes (Signed)
Office: [phone]  /  Fax: [phone]   HPI:   Chief Complaint: OBESITY Rayleigh is here to discuss her progress with her obesity treatment plan. She is the Category 3 plan and journaling lunch and dinner. She is following her eating plan approximately 0% of the time. She states she is exercising 0 minutes 0 times per week. Syndi reports that she has been off the plan recently due to personal issues. She is now ready to get back on track.  Her weight is 248 lb (112.5 kg) today and has had a weight gain of 10 pounds since her last visit. She has lost 0 lbs since starting treatment with [place].  Pre-Diabetes Lelah has a diagnosis of prediabetes based on her elevated Hgb A1c and was informed this puts her at greater risk of developing diabetes. She is taking metformin currently and continues to work on diet and exercise to decrease risk of diabetes. She denies nausea, vomiting, diarrhea, or polyphagia.   ASSESSMENT AND PLAN:  Prediabetes  Class 3 severe obesity with serious comorbidity and body mass index (BMI) of 40.0 to 44.9 in adult, unspecified obesity type [facility])  PLAN:  Pre-Diabetes Tishara will continue to work on weight loss, exercise, and decreasing simple carbohydrates in her diet to help decrease the risk of diabetes. We dicussed metformin including benefits and risks. She was informed that eating too many simple carbohydrates or too many calories at one sitting increases the likelihood of GI side effects. Ellenie will continue on metformin for now and a prescription was not written today. Tacha agrees to follow-up with our clinic in 2 weeks. .  I spent > than 50% of the 15 minute visit on counseling as documented in the note.  Obesity Jameelah is currently in the action stage of change. As such, her goal is to get back to weightloss efforts. She has agreed to follow the Category 3 plan only. Amberlie has been instructed to work up to a goal of 150 minutes of combined  cardio and strengthening exercise per week for weight loss and overall health benefits. We discussed the following Behavioral Modification Strategies today: work on meal planning and easy cooking plans and keeping healthy foods in the home.,  Lesta has agreed to follow up with our clinic in 2 weeks. She was informed of the importance of frequent follow up visits to maximize her success with intensive lifestyle modifications for her multiple health conditions.  ALLERGIES: No Known Allergies  MEDICATIONS: Current Outpatient Medications on File Prior to Visit  Medication Sig Dispense Refill  . acetaminophen (TYLENOL) 500 MG tablet Take 2 tablets (1,000 mg total) by mouth every 6 (six) hours as needed for moderate pain or fever. 30 tablet 0  . eletriptan (RELPAX) 20 MG tablet Take 1 tablet (20 mg total) by mouth as needed for migraine or headache. May repeat in 2 hours if headache persists or recurs. 10 tablet 0  . levocetirizine (XYZAL) 5 MG tablet Take 1 tablet (5 mg total) by mouth every evening. 30 tablet 2  . metFORMIN (GLUCOPHAGE-XR) 500 MG 24 hr tablet Take 1 tablet (500 mg total) by mouth daily with breakfast. 30 tablet 2  . Phentermine-Topiramate 3.75-23 MG CP24 Take 1 capsule by mouth daily. 30 capsule 0  . valACYclovir (VALTREX) 1000 MG tablet Take 1 tab daily for 5 days during outbreaks. 30 tablet 1   No current facility-administered medications on file prior to visit.     PAST MEDICAL HISTORY: Past Medical History:  Diagnosis Date  . Anxiety   . Asthma   . Back pain   . Depression   . Fatigue   . Floaters   . GERD (gastroesophageal reflux disease)   . HTN (hypertension)   . Migraine   . Neck pain   . Pre-diabetes   . Shortness of breath   . Trouble in sleeping     PAST SURGICAL HISTORY: Past Surgical History:  Procedure Laterality Date  . CESAREAN SECTION    . DILATION AND EVACUATION N/A 07/02/2018   Procedure: DILATATION AND EVACUATION;  Surgeon: Aletha Halim, MD;  Location: [facility];  Service: Gynecology;  Laterality: N/A;  . WISDOM TOOTH EXTRACTION      SOCIAL HISTORY: Social History   Tobacco Use  . Smoking status: Never Smoker  . Smokeless tobacco: Never Used  Substance Use Topics  . Alcohol use: No  . Drug use: No    FAMILY HISTORY: Family History  Problem Relation Age of Onset  . Hypertension Mother   . Heart attack Father 17  . Obesity Father   . Hypertension Maternal Grandmother   . Diabetes Maternal Grandmother   . Lung cancer Maternal Grandfather   . Diabetes Paternal Grandmother   . Allergies Daughter    ROS: Review of Systems  Constitutional: Negative for weight loss.  Gastrointestinal: Negative for diarrhea, nausea and vomiting.  Endo/Heme/Allergies:       Negative for polyphagia.   PHYSICAL EXAM: Blood pressure 111/73, pulse 68, height 5\' 3"  (1.6 m), weight 248 lb (112.5 kg), last menstrual period 08/08/2018, SpO2 99 %. Body mass index is 43.93 kg/m. Physical Exam Vitals signs reviewed.  Constitutional:      Appearance: Normal appearance. She is obese.  Cardiovascular:     Rate and Rhythm: Normal rate.     Pulses: Normal pulses.  Pulmonary:     Effort: Pulmonary effort is normal.     Breath sounds: Normal breath sounds.  Musculoskeletal: Normal range of motion.  Skin:    General: Skin is warm and dry.  Neurological:     Mental Status: She is alert and oriented to person, place, and time.  Psychiatric:        Behavior: Behavior normal.   RECENT LABS AND TESTS: BMET    Component Value Date/Time   NA 137 07/06/2018 0030   NA 138 02/05/2018 1120   K 3.5 07/06/2018 0030   CL 106 07/06/2018 0030   CO2 26 07/06/2018 0030   GLUCOSE 115 (H) 07/06/2018 0030   BUN 19 07/06/2018 0030   BUN 10 02/05/2018 1120   CREATININE 0.89 07/06/2018 0030   CALCIUM 8.4 (L) 07/06/2018 0030   GFRNONAA >60 07/06/2018 0030   GFRAA >60 07/06/2018 0030   Lab Results  Component Value Date   HGBA1C 5.7 (H)  02/05/2018   HGBA1C 5.5 06/13/2017   HGBA1C 5.4 10/26/2016   Lab Results  Component Value Date   INSULIN 11.8 02/05/2018   CBC    Component Value Date/Time   WBC 9.3 07/06/2018 0030   RBC 3.63 (L) 07/06/2018 0030   HGB 10.9 (L) 07/06/2018 0030   HGB 12.8 06/10/2018 0929   HCT 34.4 (L) 07/06/2018 0030   HCT 38.4 06/10/2018 0929   PLT 289 07/06/2018 0030   PLT 346 06/10/2018 0929   MCV 94.8 07/06/2018 0030   MCV 91 06/10/2018 0929   MCH 30.0 07/06/2018 0030   MCHC 31.7 07/06/2018 0030   RDW 13.0 07/06/2018 0030   RDW 12.1 (  L) 06/10/2018 0929   LYMPHSABS 3.1 07/06/2018 0030   LYMPHSABS 2.5 06/10/2018 0929   MONOABS 0.7 07/06/2018 0030   EOSABS 0.1 07/06/2018 0030   EOSABS 0.3 06/10/2018 0929   BASOSABS 0.0 07/06/2018 0030   BASOSABS 0.0 06/10/2018 0929   Iron/TIBC/Ferritin/ %Sat No results found for: IRON, TIBC, FERRITIN, IRONPCTSAT Lipid Panel     Component Value Date/Time   CHOL 138 02/05/2018 1120   TRIG 85 02/05/2018 1120   HDL 47 02/05/2018 1120   CHOLHDL 3 05/03/2017 0808   VLDL 10.6 05/03/2017 0808   LDLCALC 74 02/05/2018 1120   Hepatic Function Panel     Component Value Date/Time   PROT 6.8 02/05/2018 1120   ALBUMIN 4.0 02/05/2018 1120   AST 19 02/05/2018 1120   ALT 12 02/05/2018 1120   ALKPHOS 50 02/05/2018 1120   BILITOT 0.6 02/05/2018 1120      Component Value Date/Time   TSH 1.060 02/05/2018 1120   TSH 0.47 05/14/2017 1344   Results for FELESHIA, ZUNDEL (MRN [id number]) as of 08/29/2018 16:14  Ref. Range 02/05/2018 11:20  Vitamin D, 25-Hydroxy Latest Ref Range: 30.0 - 100.0 ng/mL 19.1 (L)   OBESITY BEHAVIORAL INTERVENTION VISIT  Today's visit was #5  Starting weight: 243 lbs Starting date: 02/05/2018 Today's weight: 248 lbs Today's date: 08/29/2018 Total lbs lost to date: 0  ASK: We discussed the diagnosis of obesity with Marylou Flesher today and [place] agreed to give [place] permission to discuss obesity behavioral modification therapy  today.  ASSESS: Billy has the diagnosis of obesity and her BMI today is 43.93. Alleyah is in the action stage of change.   ADVISE: Finola was educated on the multiple health risks of obesity as well as the benefit of weight loss to improve her health. She was advised of the need for long term treatment and the importance of lifestyle modifications to improve her current health and to decrease her risk of future health problems.  AGREE: Multiple dietary modification options and treatment options were discussed and  Temima agreed to follow the recommendations documented in the above note.  ARRANGE: Richardine was educated on the importance of frequent visits to treat obesity as outlined per CMS and USPSTF guidelines and agreed to schedule her next follow up appointment today.  Migdalia Dk, am acting as transcriptionist for Abby Potash, PA-C I, Abby Potash, PA-C have reviewed above note and agree with its content

## 2018-09-04 ENCOUNTER — Ambulatory Visit (INDEPENDENT_AMBULATORY_CARE_PROVIDER_SITE_OTHER): Payer: 59 | Admitting: Psychology

## 2018-09-04 DIAGNOSIS — F4323 Adjustment disorder with mixed anxiety and depressed mood: Secondary | ICD-10-CM | POA: Diagnosis not present

## 2018-09-08 ENCOUNTER — Other Ambulatory Visit: Payer: Self-pay | Admitting: Family Medicine

## 2018-09-09 ENCOUNTER — Other Ambulatory Visit: Payer: Self-pay | Admitting: Family Medicine

## 2018-09-09 MED ORDER — PHENTERMINE-TOPIRAMATE ER 3.75-23 MG PO CP24: 1.0000 | ORAL_CAPSULE | Freq: Every day | ORAL | 0 refills | Status: DC

## 2018-09-12 ENCOUNTER — Ambulatory Visit (INDEPENDENT_AMBULATORY_CARE_PROVIDER_SITE_OTHER): Payer: 59 | Admitting: Physician Assistant

## 2018-09-12 ENCOUNTER — Encounter (INDEPENDENT_AMBULATORY_CARE_PROVIDER_SITE_OTHER): Payer: Self-pay | Admitting: Physician Assistant

## 2018-09-12 VITALS — BP 120/74 | HR 68 | Temp 98.1°F | Ht 63.0 in | Wt 246.0 lb

## 2018-09-12 DIAGNOSIS — R7303 Prediabetes: Secondary | ICD-10-CM | POA: Diagnosis not present

## 2018-09-12 DIAGNOSIS — Z9189 Other specified personal risk factors, not elsewhere classified: Secondary | ICD-10-CM | POA: Diagnosis not present

## 2018-09-12 DIAGNOSIS — E559 Vitamin D deficiency, unspecified: Secondary | ICD-10-CM

## 2018-09-12 DIAGNOSIS — Z6841 Body Mass Index (BMI) 40.0 and over, adult: Secondary | ICD-10-CM

## 2018-09-12 MED ORDER — VITAMIN D (ERGOCALCIFEROL) 1.25 MG (50000 UNIT) PO CAPS: 50000.0000 [IU] | ORAL_CAPSULE | ORAL | 0 refills | Status: DC

## 2018-09-12 MED FILL — VIT D2 1.25 MG (50,000 UNIT: 1.25 MG | 28 days supply | Qty: 4 | Fill #0

## 2018-09-15 NOTE — Progress Notes (Signed)
Office: [phone]  /  Fax: [phone]   HPI:   Chief Complaint: OBESITY Naysa is here to discuss her progress with her obesity treatment plan. She is on the Category 3 plan and is following her eating plan approximately 85 % of the time. She states she is exercising 0 minutes 0 times per week. Heidemarie did very well with weight loss. She reports that she is enjoying being on the plan. She is asking about adding mayo and wine to her plan.  Her weight is 246 lb (111.6 kg) today and has had a weight loss of 2 pounds over a period of 2 weeks since her last visit. She has lost 0 lbs since starting treatment with [place].  Vitamin D Deficiency Jacky has a diagnosis of vitamin D deficiency. She is not currently taking Vit D and denies nausea, vomiting or muscle weakness.  At risk for osteopenia and osteoporosis Shana is at higher risk of osteopenia and osteoporosis due to vitamin D deficiency.   Pre-Diabetes Devery has a diagnosis of pre-diabetes based on her elevated Hgb A1c and was informed this puts her at greater risk of developing diabetes. She is taking metformin currently and denies nausea, vomiting, or diarrhea. She continues to work on diet and exercise to decrease risk of diabetes. She denies polyphagia or hypoglycemia.  ALLERGIES: No Known Allergies  MEDICATIONS: Current Outpatient Medications on File Prior to Visit  Medication Sig Dispense Refill  . acetaminophen (TYLENOL) 500 MG tablet Take 2 tablets (1,000 mg total) by mouth every 6 (six) hours as needed for moderate pain or fever. 30 tablet 0  . eletriptan (RELPAX) 20 MG tablet Take 1 tablet (20 mg total) by mouth as needed for migraine or headache. May repeat in 2 hours if headache persists or recurs. 10 tablet 0  . levocetirizine (XYZAL) 5 MG tablet Take 1 tablet (5 mg total) by mouth every evening. 30 tablet 2  . metFORMIN (GLUCOPHAGE-XR) 500 MG 24 hr tablet Take 1 tablet (500 mg total) by mouth daily with breakfast.  30 tablet 2  . Phentermine-Topiramate 3.75-23 MG CP24 Take 1 capsule by mouth daily. 30 capsule 0  . valACYclovir (VALTREX) 1000 MG tablet Take 1 tab daily for 5 days during outbreaks. 30 tablet 1   No current facility-administered medications on file prior to visit.     PAST MEDICAL HISTORY: Past Medical History:  Diagnosis Date  . Anxiety   . Asthma   . Back pain   . Depression   . Fatigue   . Floaters   . GERD (gastroesophageal reflux disease)   . HTN (hypertension)   . Migraine   . Neck pain   . Pre-diabetes   . Shortness of breath   . Trouble in sleeping     PAST SURGICAL HISTORY: Past Surgical History:  Procedure Laterality Date  . CESAREAN SECTION    . DILATION AND EVACUATION N/A 07/02/2018   Procedure: DILATATION AND EVACUATION;  Surgeon: Aletha Halim, MD;  Location: [facility];  Service: Gynecology;  Laterality: N/A;  . WISDOM TOOTH EXTRACTION      SOCIAL HISTORY: Social History   Tobacco Use  . Smoking status: Never Smoker  . Smokeless tobacco: Never Used  Substance Use Topics  . Alcohol use: No  . Drug use: No    FAMILY HISTORY: Family History  Problem Relation Age of Onset  . Hypertension Mother   . Heart attack Father 47  . Obesity Father   . Hypertension Maternal Grandmother   .  Diabetes Maternal Grandmother   . Lung cancer Maternal Grandfather   . Diabetes Paternal Grandmother   . Allergies Daughter     ROS: Review of Systems  Constitutional: Positive for weight loss.  Gastrointestinal: Negative for diarrhea, nausea and vomiting.  Musculoskeletal:       Negative muscle weakness  Endo/Heme/Allergies:       Negative polyphagia Negative hypoglycemia    PHYSICAL EXAM: Blood pressure 120/74, pulse 68, temperature 98.1 F (36.7 C), height 5\' 3"  (1.6 m), weight 246 lb (111.6 kg), last menstrual period 09/03/2018, SpO2 99 %. Body mass index is 43.58 kg/m. Physical Exam Vitals signs reviewed.  Constitutional:      Appearance: Normal  appearance. She is obese.  Cardiovascular:     Rate and Rhythm: Normal rate.     Pulses: Normal pulses.  Pulmonary:     Effort: Pulmonary effort is normal.     Breath sounds: Normal breath sounds.  Musculoskeletal: Normal range of motion.  Skin:    General: Skin is warm and dry.  Neurological:     Mental Status: She is alert and oriented to person, place, and time.  Psychiatric:        Mood and Affect: Mood normal.        Behavior: Behavior normal.     RECENT LABS AND TESTS: BMET    Component Value Date/Time   NA 137 07/06/2018 0030   NA 138 02/05/2018 1120   K 3.5 07/06/2018 0030   CL 106 07/06/2018 0030   CO2 26 07/06/2018 0030   GLUCOSE 115 (H) 07/06/2018 0030   BUN 19 07/06/2018 0030   BUN 10 02/05/2018 1120   CREATININE 0.89 07/06/2018 0030   CALCIUM 8.4 (L) 07/06/2018 0030   GFRNONAA >60 07/06/2018 0030   GFRAA >60 07/06/2018 0030   Lab Results  Component Value Date   HGBA1C 5.7 (H) 02/05/2018   HGBA1C 5.5 06/13/2017   HGBA1C 5.4 10/26/2016   Lab Results  Component Value Date   INSULIN 11.8 02/05/2018   CBC    Component Value Date/Time   WBC 9.3 07/06/2018 0030   RBC 3.63 (L) 07/06/2018 0030   HGB 10.9 (L) 07/06/2018 0030   HGB 12.8 06/10/2018 0929   HCT 34.4 (L) 07/06/2018 0030   HCT 38.4 06/10/2018 0929   PLT 289 07/06/2018 0030   PLT 346 06/10/2018 0929   MCV 94.8 07/06/2018 0030   MCV 91 06/10/2018 0929   MCH 30.0 07/06/2018 0030   MCHC 31.7 07/06/2018 0030   RDW 13.0 07/06/2018 0030   RDW 12.1 (L) 06/10/2018 0929   LYMPHSABS 3.1 07/06/2018 0030   LYMPHSABS 2.5 06/10/2018 0929   MONOABS 0.7 07/06/2018 0030   EOSABS 0.1 07/06/2018 0030   EOSABS 0.3 06/10/2018 0929   BASOSABS 0.0 07/06/2018 0030   BASOSABS 0.0 06/10/2018 0929   Iron/TIBC/Ferritin/ %Sat No results found for: IRON, TIBC, FERRITIN, IRONPCTSAT Lipid Panel     Component Value Date/Time   CHOL 138 02/05/2018 1120   TRIG 85 02/05/2018 1120   HDL 47 02/05/2018 1120    CHOLHDL 3 05/03/2017 0808   VLDL 10.6 05/03/2017 0808   LDLCALC 74 02/05/2018 1120   Hepatic Function Panel     Component Value Date/Time   PROT 6.8 02/05/2018 1120   ALBUMIN 4.0 02/05/2018 1120   AST 19 02/05/2018 1120   ALT 12 02/05/2018 1120   ALKPHOS 50 02/05/2018 1120   BILITOT 0.6 02/05/2018 1120      Component Value Date/Time  TSH 1.060 02/05/2018 1120   TSH 0.47 05/14/2017 1344    ASSESSMENT AND PLAN: Vitamin D deficiency - Plan: Vitamin D, Ergocalciferol, (DRISDOL) 1.25 MG (50000 UT) CAPS capsule  Prediabetes  At risk for osteoporosis  Class 3 severe obesity with serious comorbidity and body mass index (BMI) of 40.0 to 44.9 in adult, unspecified obesity type ([place])  PLAN:  Vitamin D Deficiency Pheobe was informed that low vitamin D levels contributes to fatigue and are associated with obesity, breast, and colon cancer. Camella agrees to restart prescription Vit D @50 ,000 IU every week #4 with no refills. She will follow up for routine testing of vitamin D, at least 2-3 times per year. She was informed of the risk of over-replacement of vitamin D and agrees to not increase her dose unless she discusses this with [place] first. Docia agrees to follow up with our clinic in 2 to 3 weeks.  At risk for osteopenia and osteoporosis Velmer was given extended (15 minutes) osteoporosis prevention counseling today. Lauralyn is at risk for osteopenia and osteoporsis due to her vitamin D deficiency. She was encouraged to take her vitamin D and follow her higher calcium diet and increase strengthening exercise to help strengthen her bones and decrease her risk of osteopenia and osteoporosis.  Pre-Diabetes Arryanna will continue to work on weight loss, diet, exercise, and decreasing simple carbohydrates in her diet to help decrease the risk of diabetes. We dicussed metformin including benefits and risks. She was informed that eating too many simple carbohydrates or too many calories at  one sitting increases the likelihood of GI side effects. Dejanay agrees to continue taking metformin, and she agrees to follow up with our clinic in 2 to 3 weeks as directed to monitor her progress.  Obesity Carlinda is currently in the action stage of change. As such, her goal is to continue with weight loss efforts She has agreed to follow the Category 3 plan Preslie has been instructed to work up to a goal of 150 minutes of combined cardio and strengthening exercise per week for weight loss and overall health benefits. We discussed the following Behavioral Modification Strategies today: work on meal planning and easy cooking plans and ways to avoid boredom eating   Keyari has agreed to follow up with our clinic in 2 to 3 weeks. She was informed of the importance of frequent follow up visits to maximize her success with intensive lifestyle modifications for her multiple health conditions.   OBESITY BEHAVIORAL INTERVENTION VISIT  Today's visit was # 6   Starting weight: 243 lbs Starting date: 02/05/18 Today's weight : 246 lbs  Today's date: 09/12/2018 Total lbs lost to date: 0    09/12/2018  Height 5\' 3"  (1.6 m)  Weight 246 lb (111.6 kg)  BMI (Calculated) 43.59  BLOOD PRESSURE - SYSTOLIC 224  BLOOD PRESSURE - DIASTOLIC 74   Body Fat % 82.5 %  Total Body Water (lbs) 93.4 lbs     ASK: We discussed the diagnosis of obesity with Marylou Flesher today and Josepha agreed to give [place] permission to discuss obesity behavioral modification therapy today.  ASSESS: Terissa has the diagnosis of obesity and her BMI today is 43.59 Ester is in the action stage of change   ADVISE: Tally was educated on the multiple health risks of obesity as well as the benefit of weight loss to improve her health. She was advised of the need for long term treatment and the importance of lifestyle modifications.  AGREE: Multiple dietary  modification options and treatment options were discussed and   Yassmin agreed to the above obesity treatment plan.  Wilhemena Durie, am acting as transcriptionist for Abby Potash, PA-C I, Abby Potash, PA-C have reviewed above note and agree with its content

## 2018-09-18 ENCOUNTER — Telehealth: Payer: Self-pay | Admitting: *Deleted

## 2018-09-18 MED ORDER — PIMECROLIMUS 1 % EX CREA: TOPICAL_CREAM | Freq: Two times a day (BID) | CUTANEOUS | 5 refills | Status: DC

## 2018-09-18 NOTE — Telephone Encounter (Signed)
Will try bid topical for 6 mo. Sent. Pt informed.

## 2018-09-18 NOTE — Telephone Encounter (Signed)
Pt reports face is breaking out and black spots getting darker pt wants to know if something can be sent it or what can she do to clear face up. Please advise.

## 2018-09-19 ENCOUNTER — Ambulatory Visit (INDEPENDENT_AMBULATORY_CARE_PROVIDER_SITE_OTHER): Payer: 59 | Admitting: Psychology

## 2018-09-19 DIAGNOSIS — F4323 Adjustment disorder with mixed anxiety and depressed mood: Secondary | ICD-10-CM | POA: Diagnosis not present

## 2018-09-24 ENCOUNTER — Encounter: Payer: Self-pay | Admitting: Family Medicine

## 2018-09-24 ENCOUNTER — Ambulatory Visit (INDEPENDENT_AMBULATORY_CARE_PROVIDER_SITE_OTHER): Payer: 59 | Admitting: Family Medicine

## 2018-09-24 VITALS — BP 120/72 | HR 79 | Temp 98.2°F | Resp 18 | Wt 246.0 lb

## 2018-09-24 DIAGNOSIS — I10 Essential (primary) hypertension: Secondary | ICD-10-CM | POA: Diagnosis not present

## 2018-09-24 DIAGNOSIS — M549 Dorsalgia, unspecified: Secondary | ICD-10-CM

## 2018-09-24 DIAGNOSIS — R350 Frequency of micturition: Secondary | ICD-10-CM | POA: Diagnosis not present

## 2018-09-24 LAB — CBC WITH DIFFERENTIAL/PLATELET
BASOS PCT: 0.3 % (ref 0.0–3.0)
Basophils Absolute: 0 10*3/uL (ref 0.0–0.1)
Eosinophils Absolute: 0.1 10*3/uL (ref 0.0–0.7)
Eosinophils Relative: 2.1 % (ref 0.0–5.0)
HCT: 39.6 % (ref 36.0–46.0)
Hemoglobin: 13.2 g/dL (ref 12.0–15.0)
Lymphocytes Relative: 48.9 % — ABNORMAL HIGH (ref 12.0–46.0)
Lymphs Abs: 3 10*3/uL (ref 0.7–4.0)
MCHC: 33.4 g/dL (ref 30.0–36.0)
MCV: 91.9 fl (ref 78.0–100.0)
MONOS PCT: 10.2 % (ref 3.0–12.0)
Monocytes Absolute: 0.6 10*3/uL (ref 0.1–1.0)
Neutro Abs: 2.4 10*3/uL (ref 1.4–7.7)
Neutrophils Relative %: 38.5 % — ABNORMAL LOW (ref 43.0–77.0)
Platelets: 271 10*3/uL (ref 150.0–400.0)
RBC: 4.31 Mil/uL (ref 3.87–5.11)
RDW: 12.7 % (ref 11.5–15.5)
WBC: 6.2 10*3/uL (ref 4.0–10.5)

## 2018-09-24 LAB — URINALYSIS
Bilirubin Urine: NEGATIVE
Hgb urine dipstick: NEGATIVE
Ketones, ur: NEGATIVE
Leukocytes,Ua: NEGATIVE
Nitrite: NEGATIVE
Specific Gravity, Urine: 1.025 (ref 1.000–1.030)
Total Protein, Urine: NEGATIVE
UROBILINOGEN UA: 1 (ref 0.0–1.0)
Urine Glucose: 250 — AB
pH: 8 (ref 5.0–8.0)

## 2018-09-24 LAB — COMPREHENSIVE METABOLIC PANEL
ALT: 13 U/L (ref 0–35)
AST: 18 U/L (ref 0–37)
Albumin: 4.1 g/dL (ref 3.5–5.2)
Alkaline Phosphatase: 41 U/L (ref 39–117)
BUN: 12 mg/dL (ref 6–23)
CHLORIDE: 105 meq/L (ref 96–112)
CO2: 27 mEq/L (ref 19–32)
Calcium: 9.2 mg/dL (ref 8.4–10.5)
Creatinine, Ser: 0.66 mg/dL (ref 0.40–1.20)
GFR: 128.51 mL/min (ref >60.00)
Glucose, Bld: 92 mg/dL (ref 70–99)
POTASSIUM: 4 meq/L (ref 3.5–5.1)
Sodium: 139 mEq/L (ref 135–145)
Total Bilirubin: 0.5 mg/dL (ref 0.2–1.2)
Total Protein: 6.6 g/dL (ref 6.0–8.3)

## 2018-09-24 MED ORDER — NAPROXEN 500 MG PO TABS: 500.0000 mg | ORAL_TABLET | Freq: Two times a day (BID) | ORAL | 0 refills | Status: DC

## 2018-09-24 MED ORDER — TIZANIDINE HCL 4 MG PO TABS: 2.0000 mg | ORAL_TABLET | Freq: Three times a day (TID) | ORAL | 1 refills | Status: DC | PRN

## 2018-09-24 MED FILL — NAPROXEN 500 MG TABLET: 500 | 15 days supply | Qty: 30 | Fill #0

## 2018-09-24 MED FILL — tiZANidine HCL 4 MG TABS: 4 | 10 days supply | Qty: 30 | Fill #0

## 2018-09-24 NOTE — Patient Instructions (Signed)
Moist heat, followed by massage and Lidociane patch 4% to affected area Acute Back Pain, Adult Acute back pain is sudden and usually short-lived. It is often caused by an injury to the muscles and tissues in the back. The injury may result from:  A muscle or ligament getting overstretched or torn (strained). Ligaments are tissues that connect bones to each other. Lifting something improperly can cause a back strain.  Wear and tear (degeneration) of the spinal disks. Spinal disks are circular tissue that provides cushioning between the bones of the spine (vertebrae).  Twisting motions, such as while playing sports or doing yard work.  A hit to the back.  Arthritis. You may have a physical exam, lab tests, and imaging tests to find the cause of your pain. Acute back pain usually goes away with rest and home care. Follow these instructions at home: Managing pain, stiffness, and swelling  Take over-the-counter and prescription medicines only as told by your health care provider.  Your health care provider may recommend applying ice during the first 24-48 hours after your pain starts. To do this: ? Put ice in a plastic bag. ? Place a towel between your skin and the bag. ? Leave the ice on for 20 minutes, 2-3 times a day.  If directed, apply heat to the affected area as often as told by your health care provider. Use the heat source that your health care provider recommends, such as a moist heat pack or a heating pad. ? Place a towel between your skin and the heat source. ? Leave the heat on for 20-30 minutes. ? Remove the heat if your skin turns bright red. This is especially important if you are unable to feel pain, heat, or cold. You have a greater risk of getting burned. Activity   Do not stay in bed. Staying in bed for more than 1-2 days can delay your recovery.  Sit up and stand up straight. Avoid leaning forward when you sit, or hunching over when you stand. ? If you work at a  desk, sit close to it so you do not need to lean over. Keep your chin tucked in. Keep your neck drawn back, and keep your elbows bent at a right angle. Your arms should look like the letter "L." ? Sit high and close to the steering wheel when you drive. Add lower back (lumbar) support to your car seat, if needed.  Take short walks on even surfaces as soon as you are able. Try to increase the length of time you walk each day.  Do not sit, drive, or stand in one place for more than 30 minutes at a time. Sitting or standing for long periods of time can put stress on your back.  Do not drive or use heavy machinery while taking prescription pain medicine.  Use proper lifting techniques. When you bend and lift, use positions that put less stress on your back: ? [place] your knees. ? Keep the load close to your body. ? Avoid twisting.  Exercise regularly as told by your health care provider. Exercising helps your back heal faster and helps prevent back injuries by keeping muscles strong and flexible.  Work with a physical therapist to make a safe exercise program, as recommended by your health care provider. Do any exercises as told by your physical therapist. Lifestyle  Maintain a healthy weight. Extra weight puts stress on your back and makes it difficult to have good posture.  Avoid activities or situations  that make you feel anxious or stressed. Stress and anxiety increase muscle tension and can make back pain worse. Learn ways to manage anxiety and stress, such as through exercise. General instructions  Sleep on a firm mattress in a comfortable position. Try lying on your side with your knees slightly bent. If you lie on your back, put a pillow under your knees.  Follow your treatment plan as told by your health care provider. This may include: ? Cognitive or behavioral therapy. ? Acupuncture or massage therapy. ? Meditation or yoga. Contact a health care provider if:  You have pain that  is not relieved with rest or medicine.  You have increasing pain going down into your legs or buttocks.  Your pain does not improve after 2 weeks.  You have pain at night.  You lose weight without trying.  You have a fever or chills. Get help right away if:  You develop new bowel or bladder control problems.  You have unusual weakness or numbness in your arms or legs.  You develop nausea or vomiting.  You develop abdominal pain.  You feel faint. Summary  Acute back pain is sudden and usually short-lived.  Use proper lifting techniques. When you bend and lift, use positions that put less stress on your back.  Take over-the-counter and prescription medicines and apply heat or ice as directed by your health care provider. This information is not intended to replace advice given to you by your health care provider. Make sure you discuss any questions you have with your health care provider. Document Released: 07/03/2005 Document Revised: 02/07/2018 Document Reviewed: 02/14/2017 Elsevier Interactive Patient Education  2019 [employer].

## 2018-09-25 DIAGNOSIS — M549 Dorsalgia, unspecified: Secondary | ICD-10-CM | POA: Insufficient documentation

## 2018-09-25 DIAGNOSIS — R350 Frequency of micturition: Secondary | ICD-10-CM | POA: Insufficient documentation

## 2018-09-25 LAB — URINE CULTURE
MICRO NUMBER:: 299546
SPECIMEN QUALITY: ADEQUATE

## 2018-09-25 NOTE — Assessment & Plan Note (Signed)
Well controlled, no changes to meds. Encouraged heart healthy diet such as the DASH diet and exercise as tolerated.  °

## 2018-09-25 NOTE — Assessment & Plan Note (Signed)
Check labs including urine culture.

## 2018-09-25 NOTE — Progress Notes (Signed)
Subjective:    Patient ID: Danielle Garcia, female    DOB: April 21, 1991, 28 y.o.   MRN: [id number]  No chief complaint on file.   HPI Patient is in today for evaluation of back pain.  It has been bothering her for roughly a week started on the left side but has now moved to the right side.  It is bilateral but worse on the right.  It is worse with movement.  No recent falls or trauma.  No fevers or chills.  No change in bowel habits.  She does note some urinary frequency but denies dysuria or hematuria.  She is not taking any medications to manage at this time.  Past Medical History:  Diagnosis Date  . Anxiety   . Asthma   . Back pain   . Depression   . Fatigue   . Floaters   . GERD (gastroesophageal reflux disease)   . HTN (hypertension)   . Migraine   . Neck pain   . Pre-diabetes   . Shortness of breath   . Trouble in sleeping     Past Surgical History:  Procedure Laterality Date  . CESAREAN SECTION    . DILATION AND EVACUATION N/A 07/02/2018   Procedure: DILATATION AND EVACUATION;  Surgeon: Aletha Halim, MD;  Location: [facility];  Service: Gynecology;  Laterality: N/A;  . WISDOM TOOTH EXTRACTION      Family History  Problem Relation Age of Onset  . Hypertension Mother   . Heart attack Father 55  . Obesity Father   . Hypertension Maternal Grandmother   . Diabetes Maternal Grandmother   . Lung cancer Maternal Grandfather   . Diabetes Paternal Grandmother   . Allergies Daughter     Social History   Socioeconomic History  . Marital status: Married    Spouse name: Peri Jefferson  . Number of children: 1  . Years of education: Not on file  . Highest education level: Not on file  Occupational History  . Occupation: CMA    Employer: [employer]  . Financial resource strain: Not on file  . Food insecurity:    Worry: Not on file    Inability: Not on file  . Transportation needs:    Medical: Not on file    Non-medical: Not on file  Tobacco Use   . Smoking status: Never Smoker  . Smokeless tobacco: Never Used  Substance and Sexual Activity  . Alcohol use: No  . Drug use: No  . Sexual activity: Yes    Birth control/protection: Implant  Lifestyle  . Physical activity:    Days per week: Not on file    Minutes per session: Not on file  . Stress: Not on file  Relationships  . Social connections:    Talks on phone: Not on file    Gets together: Not on file    Attends religious service: Not on file    Active member of club or organization: Not on file    Attends meetings of clubs or organizations: Not on file    Relationship status: Not on file  . Intimate partner violence:    Fear of current or ex partner: Not on file    Emotionally abused: Not on file    Physically abused: Not on file    Forced sexual activity: Not on file  Other Topics Concern  . Not on file  Social History Narrative  . Not on file    Outpatient Medications Prior  to Visit  Medication Sig Dispense Refill  . acetaminophen (TYLENOL) 500 MG tablet Take 2 tablets (1,000 mg total) by mouth every 6 (six) hours as needed for moderate pain or fever. 30 tablet 0  . eletriptan (RELPAX) 20 MG tablet Take 1 tablet (20 mg total) by mouth as needed for migraine or headache. May repeat in 2 hours if headache persists or recurs. 10 tablet 0  . levocetirizine (XYZAL) 5 MG tablet Take 1 tablet (5 mg total) by mouth every evening. 30 tablet 2  . metFORMIN (GLUCOPHAGE-XR) 500 MG 24 hr tablet Take 1 tablet (500 mg total) by mouth daily with breakfast. 30 tablet 2  . Phentermine-Topiramate 3.75-23 MG CP24 Take 1 capsule by mouth daily. 30 capsule 0  . pimecrolimus (ELIDEL) 1 % cream Apply topically 2 (two) times daily. 30 g 5  . valACYclovir (VALTREX) 1000 MG tablet Take 1 tab daily for 5 days during outbreaks. 30 tablet 1  . Vitamin D, Ergocalciferol, (DRISDOL) 1.25 MG (50000 UT) CAPS capsule Take 1 capsule (50,000 Units total) by mouth every 7 (seven) days. 4 capsule 0   No  facility-administered medications prior to visit.     No Known Allergies  Review of Systems  Constitutional: Negative for fever and malaise/fatigue.  HENT: Negative for congestion.   Eyes: Negative for blurred vision.  Respiratory: Negative for shortness of breath.   Cardiovascular: Negative for chest pain, palpitations and leg swelling.  Gastrointestinal: Negative for abdominal pain, blood in stool and nausea.  Genitourinary: Positive for frequency. Negative for dysuria.  Musculoskeletal: Positive for back pain. Negative for falls.  Skin: Negative for rash.  Neurological: Negative for dizziness, loss of consciousness and headaches.  Endo/Heme/Allergies: Negative for environmental allergies.  Psychiatric/Behavioral: Negative for depression. The patient is not nervous/anxious.        Objective:    Physical Exam Vitals signs and nursing note reviewed.  Constitutional:      General: She is not in acute distress.    Appearance: She is well-developed.  HENT:     Head: Normocephalic and atraumatic.     Nose: Nose normal.  Eyes:     General:        Right eye: No discharge.        Left eye: No discharge.  Neck:     Musculoskeletal: Normal range of motion and neck supple.  Cardiovascular:     Rate and Rhythm: Normal rate and regular rhythm.     Heart sounds: No murmur.  Pulmonary:     Effort: Pulmonary effort is normal.     Breath sounds: Normal breath sounds.  Abdominal:     General: Bowel sounds are normal.     Palpations: Abdomen is soft.     Tenderness: There is no abdominal tenderness.  Musculoskeletal:        General: Tenderness present.     Comments: Spasm over back muscles laterally around L1  Skin:    General: Skin is warm and dry.  Neurological:     Mental Status: She is alert and oriented to person, place, and time.     BP 120/72 (BP Location: Left Arm, Patient Position: Sitting, Cuff Size: Normal)   Pulse 79   Temp 98.2 F (36.8 C) (Oral)   Resp 18    Wt 246 lb (111.6 kg)   LMP 09/03/2018   SpO2 98%   BMI 43.58 kg/m  Wt Readings from Last 3 Encounters:  09/24/18 246 lb (111.6 kg)  09/12/18 246 lb (  111.6 kg)  08/29/18 248 lb (112.5 kg)     Lab Results  Component Value Date   WBC 6.2 09/24/2018   HGB 13.2 09/24/2018   HCT 39.6 09/24/2018   PLT 271.0 09/24/2018   GLUCOSE 92 09/24/2018   CHOL 138 02/05/2018   TRIG 85 02/05/2018   HDL 47 02/05/2018   LDLCALC 74 02/05/2018   ALT 13 09/24/2018   AST 18 09/24/2018   NA 139 09/24/2018   K 4.0 09/24/2018   CL 105 09/24/2018   CREATININE 0.66 09/24/2018   BUN 12 09/24/2018   CO2 27 09/24/2018   TSH 1.060 02/05/2018   HGBA1C 5.7 (H) 02/05/2018    Lab Results  Component Value Date   TSH 1.060 02/05/2018   Lab Results  Component Value Date   WBC 6.2 09/24/2018   HGB 13.2 09/24/2018   HCT 39.6 09/24/2018   MCV 91.9 09/24/2018   PLT 271.0 09/24/2018   Lab Results  Component Value Date   NA 139 09/24/2018   K 4.0 09/24/2018   CO2 27 09/24/2018   GLUCOSE 92 09/24/2018   BUN 12 09/24/2018   CREATININE 0.66 09/24/2018   BILITOT 0.5 09/24/2018   ALKPHOS 41 09/24/2018   AST 18 09/24/2018   ALT 13 09/24/2018   PROT 6.6 09/24/2018   ALBUMIN 4.1 09/24/2018   CALCIUM 9.2 09/24/2018   ANIONGAP 5 07/06/2018   GFR 128.51 09/24/2018   Lab Results  Component Value Date   CHOL 138 02/05/2018   Lab Results  Component Value Date   HDL 47 02/05/2018   Lab Results  Component Value Date   LDLCALC 74 02/05/2018   Lab Results  Component Value Date   TRIG 85 02/05/2018   Lab Results  Component Value Date   CHOLHDL 3 05/03/2017   Lab Results  Component Value Date   HGBA1C 5.7 (H) 02/05/2018       Assessment & Plan:   Problem List Items Addressed This Visit    Essential hypertension    Well controlled, no changes to meds. Encouraged heart healthy diet such as the DASH diet and exercise as tolerated.       Back pain    Started on left but is now worse on the  right. Worse with movement and significant spasm noted on palpation. Encouraged moist heat and gentle stretching as tolerated. May try NSAIDs and prescription meds as directed and report if symptoms worsen or seek immediate care. Topical lidocaine patches. Naproxen 500 mg bid and Tizanidine prn. Report if worsens.       Relevant Medications   tiZANidine (ZANAFLEX) 4 MG tablet   naproxen (NAPROSYN) 500 MG tablet   Other Relevant Orders   CBC with Differential/Platelet (Completed)   Comprehensive metabolic panel (Completed)   Urinalysis (Completed)   Urine Culture   Urinary frequency - Primary    Check labs including urine culture.      Relevant Orders   CBC with Differential/Platelet (Completed)   Comprehensive metabolic panel (Completed)   Urinalysis (Completed)   Urine Culture      I am having Sury Avey start on tiZANidine and naproxen. I am also having her maintain her levocetirizine, acetaminophen, valACYclovir, eletriptan, metFORMIN, Phentermine-Topiramate, Vitamin D (Ergocalciferol), and pimecrolimus.  Meds ordered this encounter  Medications  . tiZANidine (ZANAFLEX) 4 MG tablet    Sig: Take 0.5-1 tablets (2-4 mg total) by mouth every 8 (eight) hours as needed for muscle spasms.    Dispense:  30 tablet  Refill:  1  . naproxen (NAPROSYN) 500 MG tablet    Sig: Take 1 tablet (500 mg total) by mouth 2 (two) times daily with a meal.    Dispense:  30 tablet    Refill:  0     Penni Homans, MD

## 2018-09-25 NOTE — Assessment & Plan Note (Signed)
Started on left but is now worse on the right. Worse with movement and significant spasm noted on palpation. Encouraged moist heat and gentle stretching as tolerated. May try NSAIDs and prescription meds as directed and report if symptoms worsen or seek immediate care. Topical lidocaine patches. Naproxen 500 mg bid and Tizanidine prn. Report if worsens.

## 2018-10-03 ENCOUNTER — Ambulatory Visit: Payer: 59 | Admitting: Psychology

## 2018-10-04 ENCOUNTER — Encounter (INDEPENDENT_AMBULATORY_CARE_PROVIDER_SITE_OTHER): Payer: Self-pay | Admitting: Physician Assistant

## 2018-10-07 ENCOUNTER — Encounter (INDEPENDENT_AMBULATORY_CARE_PROVIDER_SITE_OTHER): Payer: Self-pay

## 2018-10-07 ENCOUNTER — Ambulatory Visit (INDEPENDENT_AMBULATORY_CARE_PROVIDER_SITE_OTHER): Payer: 59 | Admitting: Physician Assistant

## 2018-10-10 ENCOUNTER — Encounter (INDEPENDENT_AMBULATORY_CARE_PROVIDER_SITE_OTHER): Payer: Self-pay

## 2018-10-10 ENCOUNTER — Ambulatory Visit: Payer: Self-pay | Admitting: Psychology

## 2018-10-18 ENCOUNTER — Ambulatory Visit: Payer: Self-pay | Admitting: Psychology

## 2018-10-29 ENCOUNTER — Ambulatory Visit (INDEPENDENT_AMBULATORY_CARE_PROVIDER_SITE_OTHER): Payer: 59 | Admitting: Psychology

## 2018-10-29 DIAGNOSIS — F4323 Adjustment disorder with mixed anxiety and depressed mood: Secondary | ICD-10-CM

## 2018-11-07 ENCOUNTER — Other Ambulatory Visit: Payer: Self-pay

## 2018-11-07 ENCOUNTER — Telehealth: Payer: 59 | Admitting: Medical

## 2018-11-19 ENCOUNTER — Ambulatory Visit (INDEPENDENT_AMBULATORY_CARE_PROVIDER_SITE_OTHER): Payer: 59 | Admitting: Psychology

## 2018-11-19 DIAGNOSIS — F4323 Adjustment disorder with mixed anxiety and depressed mood: Secondary | ICD-10-CM

## 2018-11-27 ENCOUNTER — Ambulatory Visit (INDEPENDENT_AMBULATORY_CARE_PROVIDER_SITE_OTHER): Payer: 59 | Admitting: Family Medicine

## 2018-11-27 ENCOUNTER — Encounter: Payer: Self-pay | Admitting: Family Medicine

## 2018-11-27 ENCOUNTER — Other Ambulatory Visit: Payer: Self-pay

## 2018-11-27 VITALS — BP 108/68 | HR 67 | Temp 98.6°F | Ht 63.0 in | Wt 253.5 lb

## 2018-11-27 DIAGNOSIS — R142 Eructation: Secondary | ICD-10-CM

## 2018-11-27 DIAGNOSIS — R194 Change in bowel habit: Secondary | ICD-10-CM

## 2018-11-27 DIAGNOSIS — K219 Gastro-esophageal reflux disease without esophagitis: Secondary | ICD-10-CM | POA: Insufficient documentation

## 2018-11-27 MED ORDER — ONDANSETRON HCL 4 MG PO TABS: 4.0000 mg | ORAL_TABLET | Freq: Three times a day (TID) | ORAL | 0 refills | Status: DC | PRN

## 2018-11-27 MED ORDER — ONDANSETRON HCL 4 MG/2ML IJ SOLN
4.0000 mg | Freq: Once | INTRAMUSCULAR | Status: AC
  Administered 2018-11-27: 4 mg via INTRAMUSCULAR

## 2018-11-27 MED ORDER — OMEPRAZOLE 20 MG PO CPDR: 20.0000 mg | DELAYED_RELEASE_CAPSULE | Freq: Two times a day (BID) | ORAL | 1 refills | Status: DC

## 2018-11-27 MED FILL — ONDANSETRON HCL 4 MG TABLET: 4 | 10 days supply | Qty: 30 | Fill #0

## 2018-11-27 MED FILL — OMEPRAZOLE 20 MG CPDR: 20 | 30 days supply | Qty: 60 | Fill #0

## 2018-11-27 NOTE — Patient Instructions (Signed)
Stay hydrated.  I am concerned you may have IBS. Consider keeping a food/symptom journal.  Take Metamucil daily.  If pain wakes you up, let me know. We have a low threshold to send you to a specialist for further evaluation.  Let us know if you need anything.

## 2018-11-27 NOTE — Progress Notes (Signed)
Chief Complaint  Patient presents with  . GI Problem    Danielle Garcia is here for epigastric abdominal pain.  Duration: 1 mo Nighttime awakenings? Yes- had one incident around 1 week ago Bleeding? No Weight loss? No Palliation: time Provocation: eating Associated symptoms: nausea and alternating loose/hard stool Denies: vomiting, fever Treatment to date: none Stress levels have increased over past month. Grandma has IBS.  ROS: Constitutional: No fevers GI: No vomiting, no bleeding + pain  Past Medical History:  Diagnosis Date  . Anxiety   . Asthma   . Back pain   . Depression   . Fatigue   . Floaters   . GERD (gastroesophageal reflux disease)   . HTN (hypertension)   . Migraine   . Neck pain   . Pre-diabetes   . Shortness of breath   . Trouble in sleeping    Family History  Problem Relation Age of Onset  . Hypertension Mother   . Heart attack Father 67  . Obesity Father   . Hypertension Maternal Grandmother   . Diabetes Maternal Grandmother   . Lung cancer Maternal Grandfather   . Diabetes Paternal Grandmother   . Allergies Daughter    Past Surgical History:  Procedure Laterality Date  . CESAREAN SECTION    . DILATION AND EVACUATION N/A 07/02/2018   Procedure: DILATATION AND EVACUATION;  Surgeon: Aletha Halim, MD;  Location: [facility];  Service: Gynecology;  Laterality: N/A;  . WISDOM TOOTH EXTRACTION      BP 108/68 (BP Location: Left Arm, Patient Position: Sitting, Cuff Size: Large)   Pulse 67   Temp 98.6 F (37 C) (Oral)   Ht 5\' 3"  (1.6 m)   Wt 253 lb 8 oz (115 kg)   SpO2 95%   BMI 44.91 kg/m  Gen.: Awake, alert, appears stated age 28: Mucous membranes moist without mucosal lesions Heart: Regular rate and rhythm without murmurs Lungs: Clear auscultation bilaterally, no rales or wheezing, normal effort without accessory muscle use. Abdomen: Bowel sounds are present. Abdomen is soft, mild ttp in epigastric region, nondistended, no masses  or organomegaly. Negative Murphy's, Rovsing's, McBurney's, and Carnett's sign. Psych: Age appropriate judgment and insight. Normal mood and affect.  Bowel habit changes - Plan: ondansetron (ZOFRAN) injection 4 mg  Gastroesophageal reflux disease, esophagitis presence not specified - Plan: ondansetron (ZOFRAN) injection 4 mg  Belching - Plan: omeprazole (PRILOSEC) 20 MG capsule, ondansetron (ZOFRAN) injection 4 mg  Orders as above. Zofran today for nausea. Restart PPI. Daily metamucil. Food/symptom journal. If more nighttime awakenings occur, will refer to GI.  F/u in 4-6 weeks. Pt voiced understanding and agreement to the plan.  [place], DO 11/27/18 10:28 AM

## 2018-12-10 IMAGING — US US ABDOMEN LIMITED
1 series · 14 of 25 positions shown · non-contrast
Comparison: None.

CLINICAL DATA: Right upper quadrant pain and nausea.

EXAM:
ULTRASOUND ABDOMEN LIMITED RIGHT UPPER QUADRANT

[Series 1: us abdomen limited · 0.20mm/px · 14 of 30 slices shown]
[im 1/30]
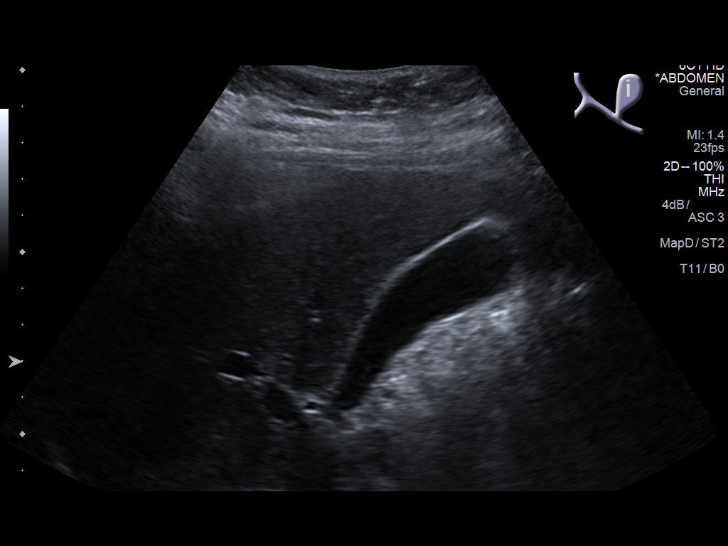
[im 3/30]
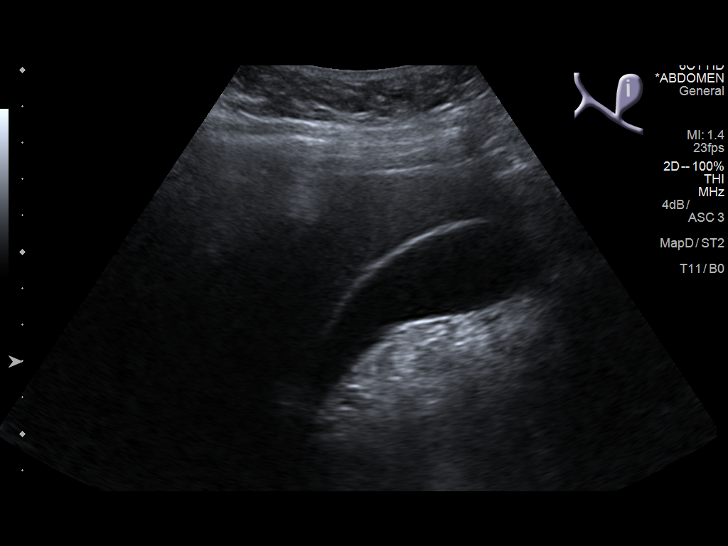
[im 5/30]
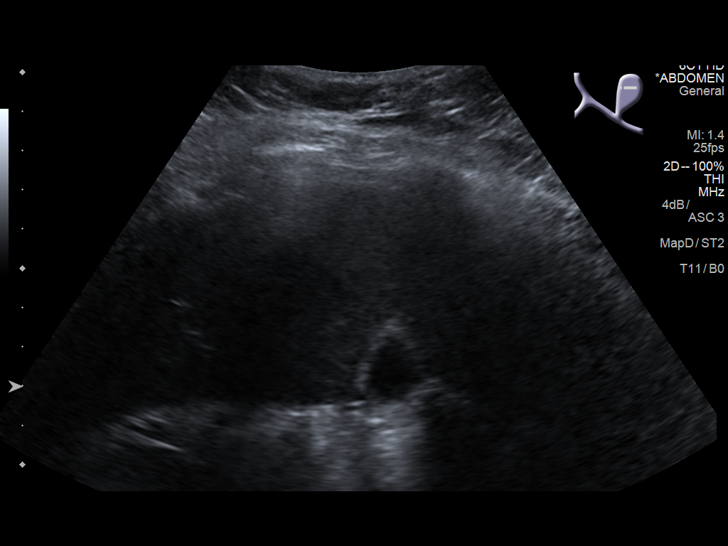
[im 8/30]
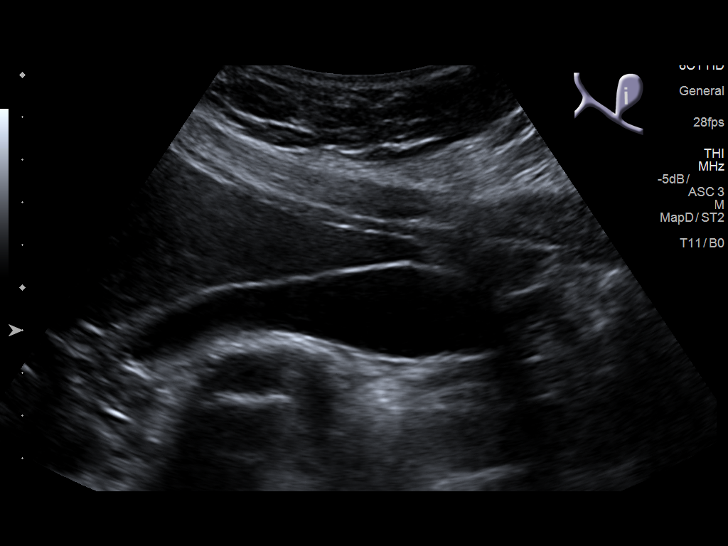
[im 10/30]
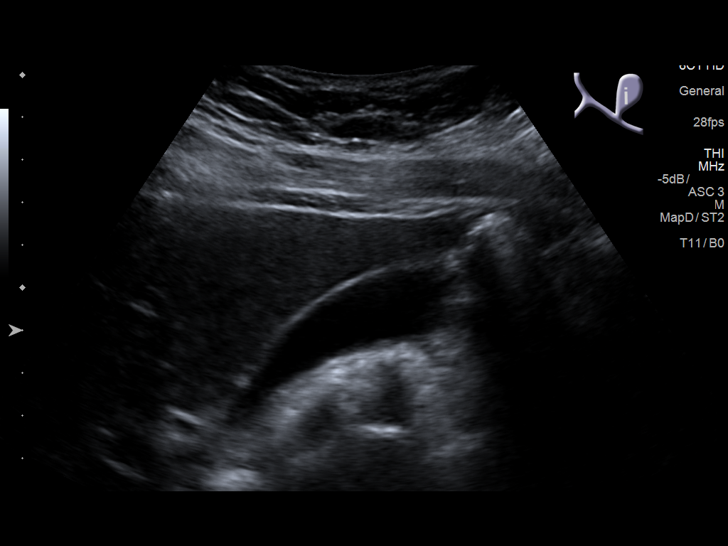
[im 11/30]
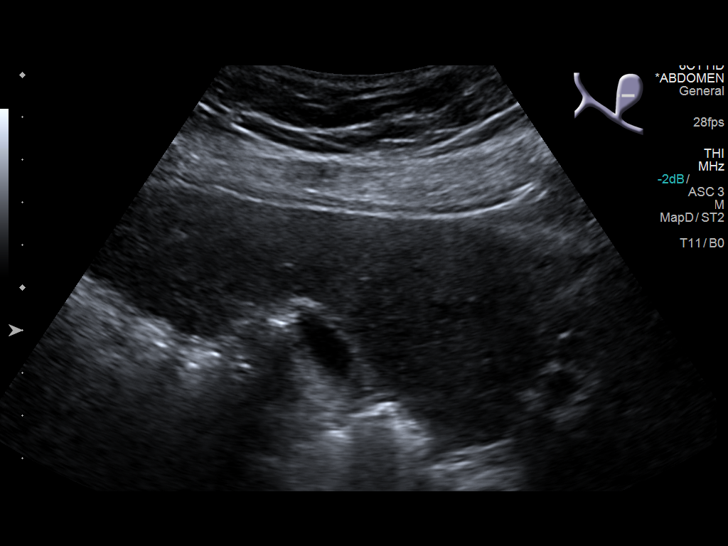
[im 14/30]
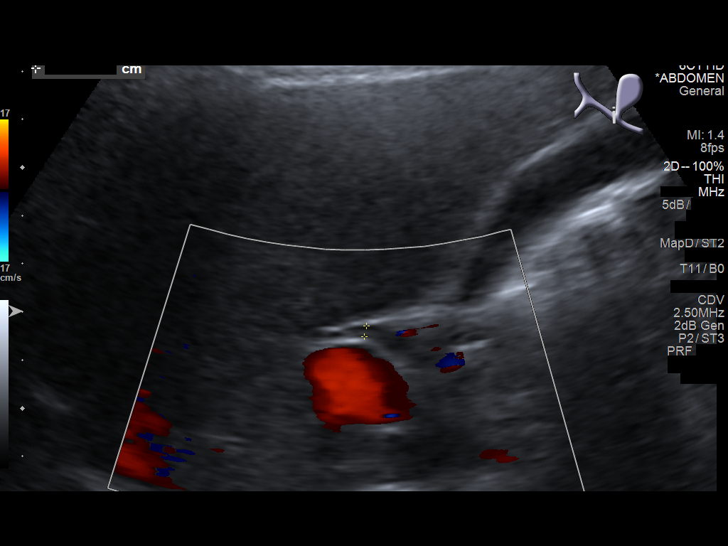
[im 16/30]
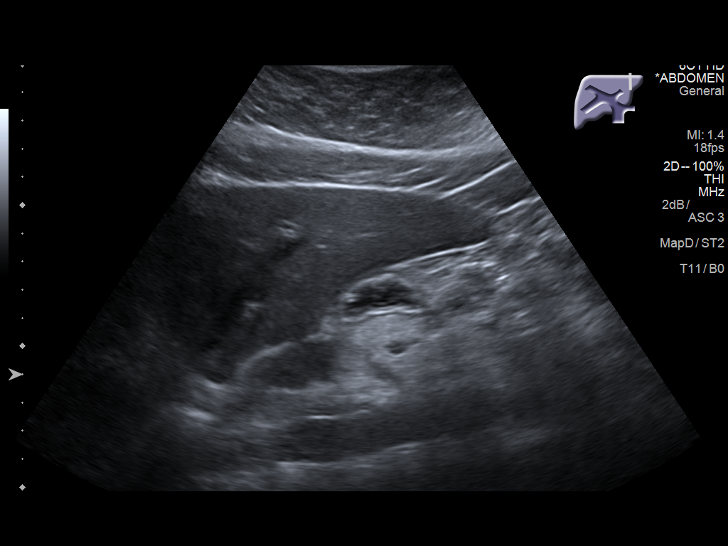
[im 19/30]
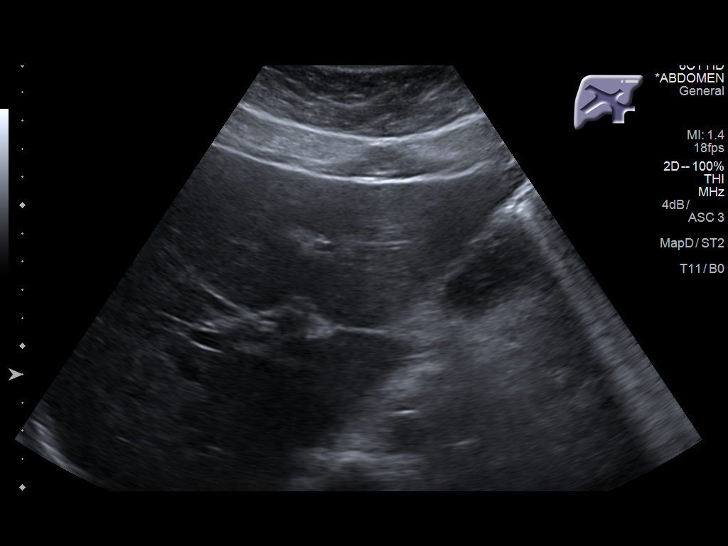
[im 20/30]
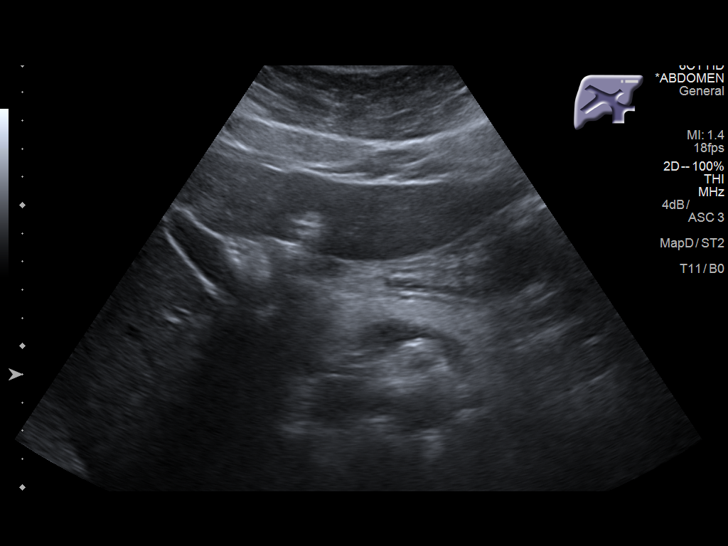
[im 22/30]
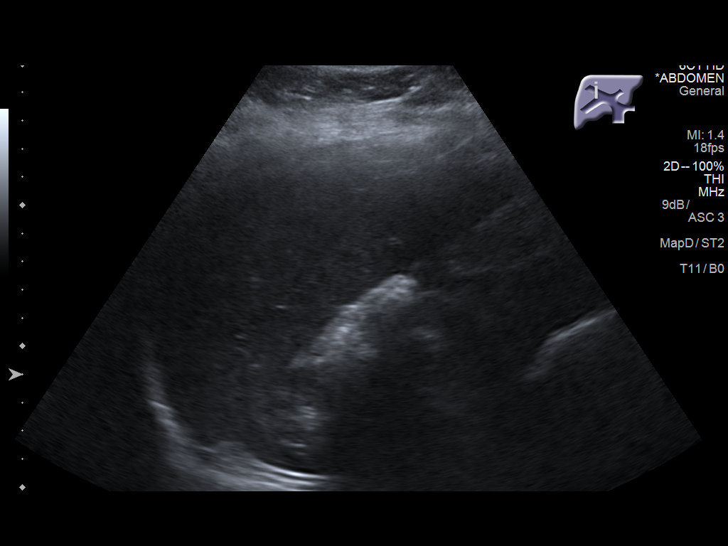
[im 25/30]
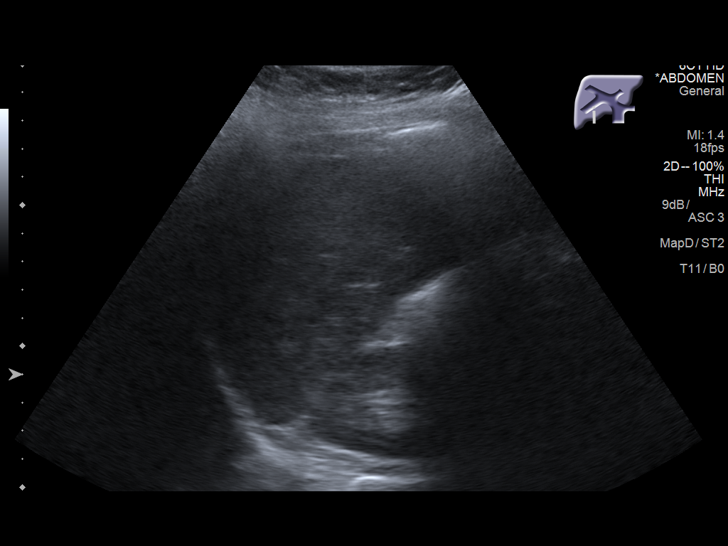
[im 27/30]
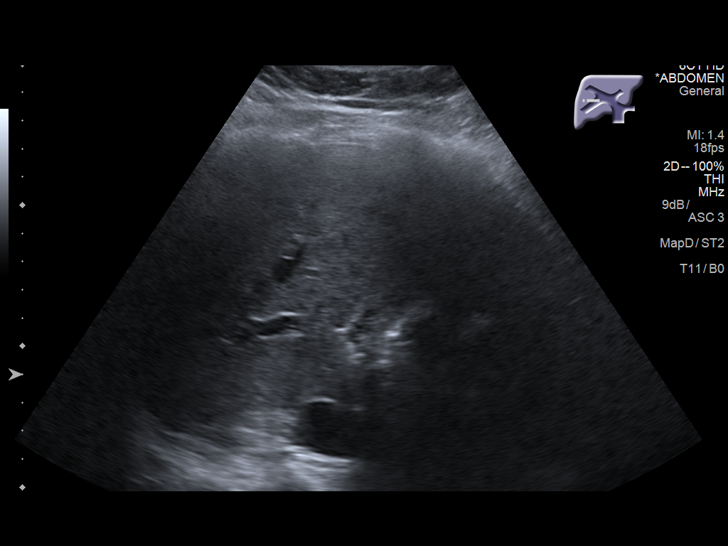
[im 30/30]
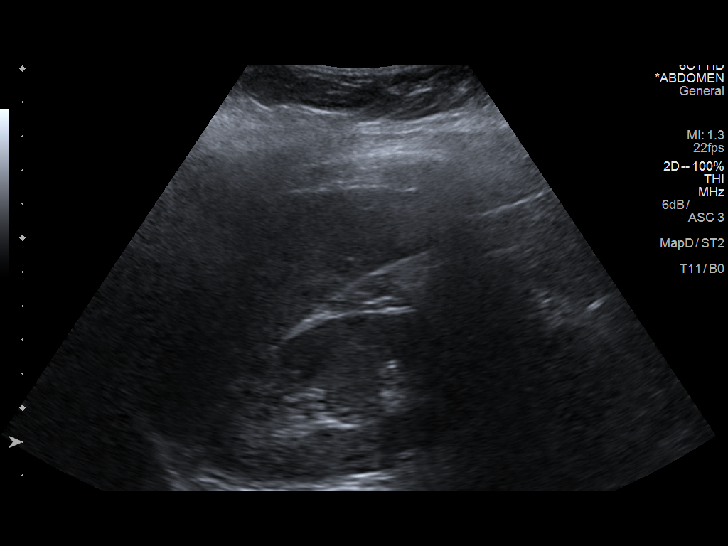

[14 of 25 positions shown; findings below may reference images not displayed]

FINDINGS: Gallbladder:

No gallstones or wall thickening visualized. No sonographic Murphy
sign noted by sonographer.

Common bile duct:

Diameter: 2 mm

Liver:

No focal lesion identified. Within normal limits in parenchymal
echogenicity. Portal vein is patent on color Doppler imaging with
normal direction of blood flow towards the liver.
IMPRESSION: Normal study.

## 2018-12-17 ENCOUNTER — Ambulatory Visit (INDEPENDENT_AMBULATORY_CARE_PROVIDER_SITE_OTHER): Payer: 59 | Admitting: Psychology

## 2018-12-17 DIAGNOSIS — F4323 Adjustment disorder with mixed anxiety and depressed mood: Secondary | ICD-10-CM

## 2019-01-02 ENCOUNTER — Telehealth: Payer: Self-pay | Admitting: Family Medicine

## 2019-01-02 DIAGNOSIS — G43109 Migraine with aura, not intractable, without status migrainosus: Secondary | ICD-10-CM

## 2019-01-02 NOTE — Telephone Encounter (Signed)
OK to place referral to neurology. Ty.

## 2019-01-02 NOTE — Telephone Encounter (Signed)
Referral done. Patient notified.

## 2019-01-02 NOTE — Addendum Note (Signed)
Addended by: Sharon Seller B on: 01/02/2019 02:55 PM   Modules accepted: Orders

## 2019-01-02 NOTE — Telephone Encounter (Signed)
The patient would like a referral to a Headache Specialist. The patient states Medication is not helping? The patient has been taking excedrine for headaches daily for this week (taking in the morning and at night a total of 4 tablets)

## 2019-01-04 ENCOUNTER — Other Ambulatory Visit: Payer: Self-pay

## 2019-01-04 ENCOUNTER — Emergency Department (HOSPITAL_BASED_OUTPATIENT_CLINIC_OR_DEPARTMENT_OTHER)
Admission: EM | Admit: 2019-01-04 | Discharge: 2019-01-04 | Disposition: A | Discharge status: Home / Self Care | Payer: 59 | Attending: Emergency Medicine | Admitting: Emergency Medicine

## 2019-01-04 ENCOUNTER — Encounter (HOSPITAL_BASED_OUTPATIENT_CLINIC_OR_DEPARTMENT_OTHER): Payer: Self-pay | Admitting: *Deleted

## 2019-01-04 DIAGNOSIS — Z79899 Other long term (current) drug therapy: Secondary | ICD-10-CM | POA: Diagnosis not present

## 2019-01-04 DIAGNOSIS — J45909 Unspecified asthma, uncomplicated: Secondary | ICD-10-CM | POA: Diagnosis not present

## 2019-01-04 DIAGNOSIS — G43809 Other migraine, not intractable, without status migrainosus: Secondary | ICD-10-CM | POA: Insufficient documentation

## 2019-01-04 DIAGNOSIS — G43909 Migraine, unspecified, not intractable, without status migrainosus: Secondary | ICD-10-CM | POA: Diagnosis present

## 2019-01-04 DIAGNOSIS — I10 Essential (primary) hypertension: Secondary | ICD-10-CM | POA: Insufficient documentation

## 2019-01-04 MED ORDER — PROCHLORPERAZINE EDISYLATE 10 MG/2ML IJ SOLN
10.0000 mg | Freq: Once | INTRAMUSCULAR | Status: AC
  Administered 2019-01-04: 10 mg via INTRAVENOUS
  Filled 2019-01-04: qty 2

## 2019-01-04 MED ORDER — DIPHENHYDRAMINE HCL 50 MG/ML IJ SOLN
12.5000 mg | Freq: Once | INTRAMUSCULAR | Status: AC
  Administered 2019-01-04: 12.5 mg via INTRAVENOUS
  Filled 2019-01-04: qty 1

## 2019-01-04 MED ORDER — KETOROLAC TROMETHAMINE 30 MG/ML IJ SOLN
30.0000 mg | Freq: Once | INTRAMUSCULAR | Status: AC
  Administered 2019-01-04: 30 mg via INTRAVENOUS
  Filled 2019-01-04: qty 1

## 2019-01-04 NOTE — Discharge Instructions (Addendum)
Continue your current medications, follow-up with your doctor if you continue to have persistent symptoms, return as needed to the emergency room

## 2019-01-04 NOTE — ED Notes (Signed)
ED Provider at bedside. 

## 2019-01-04 NOTE — ED Notes (Signed)
Pt called out stating her headache is gone. EDP notified.

## 2019-01-04 NOTE — ED Notes (Addendum)
MD notified of pt's bp.  

## 2019-01-04 NOTE — ED Triage Notes (Signed)
Pt reports mha since 0800 today. She has taken 3 doses of Excedrin without relief (last dose 15 mins pta). Denies n/v. Hx of mha

## 2019-01-04 NOTE — ED Provider Notes (Signed)
[facility] EM[facility]ENC[facility] DEPARTMENT Provider Note   CSN: [id number] Arrival date & time: 01/04/19  2122    History   Chief Complaint Chief Complaint  Patient presents with  . Migraine    HPI Danielle Garcia is a 28 y.o. female.     HPI Patient presents emergency room for evaluation of a migraine headache.  She states her headache started this morning.  She has tried taking Excedrin throughout the day without significant relief.  Patient states the headache is a typical migraine headache for her.  She is not having trouble with vomiting.  She denies any fever or chills.  No neck pain or neck stiffness.  No numbness or weakness. Past Medical History:  Diagnosis Date  . Anxiety   . Asthma   . Back pain   . Depression   . Fatigue   . Floaters   . GERD (gastroesophageal reflux disease)   . HTN (hypertension)   . Migraine   . Neck pain   . Pre-diabetes   . Shortness of breath   . Trouble in sleeping     Patient Active Problem List   Diagnosis Date Noted  . Gastroesophageal reflux disease 11/27/2018  . Back pain 09/25/2018  . Urinary frequency 09/25/2018  . Other fatigue 02/05/2018  . Shortness of breath on exertion 02/05/2018  . Migraine 02/05/2018  . Essential hypertension 02/05/2018  . Anxiety and depression 11/26/2017  . 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 CESAREAN SECTION    . DILATION AND EVACUATION N/A 07/02/2018   Procedure: DILATATION AND EVACUATION;  Surgeon: Aletha Halim, MD;  Location: [facility];  Service: Gynecology;  Laterality: N/A;  . WISDOM TOOTH EXTRACTION       OB History    Gravida  2   Para  1   Term  1   Preterm      AB  0   Living  1     SAB      TAB      Ectopic      Multiple      Live Births  1            Home Medications    Prior to Admission medications   Medication Sig Start Date End Date Taking? Authorizing Provider   acetaminophen (TYLENOL) 500 MG tablet Take 2 tablets (1,000 mg total) by mouth every 6 (six) hours as needed for moderate pain or fever. 07/02/18   Aletha Halim, MD  eletriptan (RELPAX) 20 MG tablet Take 1 tablet (20 mg total) by mouth as needed for migraine or headache. May repeat in 2 hours if headache persists or recurs. 08/19/18   [name], DO  levocetirizine (XYZAL) 5 MG tablet Take 1 tablet (5 mg total) by mouth every evening. 05/21/18   [name], DO  metFORMIN (GLUCOPHAGE-XR) 500 MG 24 hr tablet Take 1 tablet (500 mg total) by mouth daily with breakfast. 08/23/18   [name], Crosby Oyster, DO  naproxen (NAPROSYN) 500 MG tablet Take 1 tablet (500 mg total) by mouth 2 (two) times daily with a meal. 09/24/18   Mosie Lukes, MD  omeprazole (PRILOSEC) 20 MG capsule Take 1 capsule (20 mg total) by mouth 2 (two) times daily before a meal. 11/27/18   Wendling, Crosby Oyster, DO  ondansetron (ZOFRAN) 4 MG tablet Take 1 tablet (4 mg total) by mouth every 8 (eight) hours as needed for  nausea. 11/27/18   [name], DO  Phentermine-Topiramate 3.75-23 MG CP24 Take 1 capsule by mouth daily. 09/09/18   [name], DO  pimecrolimus (ELIDEL) 1 % cream Apply topically 2 (two) times daily. 09/18/18 03/21/19  [name], DO  tiZANidine (ZANAFLEX) 4 MG tablet Take 0.5-1 tablets (2-4 mg total) by mouth every 8 (eight) hours as needed for muscle spasms. 09/24/18   Mosie Lukes, MD  valACYclovir (VALTREX) 1000 MG tablet Take 1 tab daily for 5 days during outbreaks. 08/05/18   [name], DO  Vitamin D, Ergocalciferol, (DRISDOL) 1.25 MG (50000 UT) CAPS capsule Take 1 capsule (50,000 Units total) by mouth every 7 (seven) days. 09/12/18   Abby Potash, PA-C    Family History Family History  Problem Relation Age of Onset  . Hypertension Mother   . Heart attack Father 17  . Obesity Father   . Hypertension Maternal Grandmother   . Diabetes  Maternal Grandmother   . Lung cancer Maternal Grandfather   . Diabetes Paternal Grandmother   . Allergies Daughter     Social History Social History   Tobacco Use  . Smoking status: Never Smoker  . Smokeless tobacco: Never Used  Substance Use Topics  . Alcohol use: Yes    Comment: occasionally  . Drug use: No     Allergies   Patient has no known allergies.   Review of Systems Review of Systems  All other systems reviewed and are negative.    Physical Exam Updated Vital Signs BP (!) 106/47 (BP Location: Right Arm)   Pulse 66   Temp 99.3 F (37.4 C) (Oral)   Resp 16   Ht 1.6 m (5\' 3" )   Wt 113.4 kg   LMP 12/16/2018   SpO2 100%   BMI 44.29 kg/m   Physical Exam Vitals signs and nursing note reviewed.  Constitutional:      General: She is not in acute distress.    Appearance: She is well-developed.  HENT:     Head: Normocephalic and atraumatic.     Right Ear: External ear normal.     Left Ear: External ear normal.  Eyes:     General: No scleral icterus.       Right eye: No discharge.        Left eye: No discharge.     Conjunctiva/sclera: Conjunctivae normal.  Neck:     Musculoskeletal: Neck supple.     Trachea: No tracheal deviation.  Cardiovascular:     Rate and Rhythm: Normal rate and regular rhythm.  Pulmonary:     Effort: Pulmonary effort is normal. No respiratory distress.     Breath sounds: Normal breath sounds. No stridor. No wheezing or rales.  Abdominal:     General: Bowel sounds are normal. There is no distension.     Palpations: Abdomen is soft.     Tenderness: There is no abdominal tenderness. There is no guarding or rebound.  Musculoskeletal:        General: No tenderness.  Skin:    General: Skin is warm and dry.     Findings: No rash.  Neurological:     Mental Status: She is alert.     Cranial Nerves: No cranial nerve deficit (no facial droop, extraocular movements intact, no slurred speech).     Sensory: No sensory deficit.      Motor: No abnormal muscle tone or seizure activity.     Coordination: Coordination normal.  ED Treatments / Results  Labs (all labs ordered are listed, but only abnormal results are displayed) Labs Reviewed - No data to display  EKG None  Radiology No results found.  Procedures Procedures (including critical care time)  Medications Ordered in ED Medications  prochlorperazine (COMPAZINE) injection 10 mg (10 mg Intravenous Given 01/04/19 2146)  diphenhydrAMINE (BENADRYL) injection 12.5 mg (12.5 mg Intravenous Given 01/04/19 2146)  ketorolac (TORADOL) 30 MG/ML injection 30 mg (30 mg Intravenous Given 01/04/19 2146)     Initial Impression / Assessment and Plan / ED Course  I have reviewed the triage vital signs and the nursing notes.  Pertinent labs & imaging results that were available during my care of the patient were reviewed by me and considered in my medical decision making (see chart for details).    Patient presented to the emergency room for evaluation of a migraine headache.  Findings suggest meningitis.  No sudden onset to suggest subarachnoid hemorrhage.  Patient was treated with a migraine cocktail.  She had significant improvement.  At 10:45 PM the patient states she was feeling much better and was ready to go home.    Final Clinical Impressions(s) / ED Diagnoses   Final diagnoses:  Other migraine without status migrainosus, not intractable    ED Discharge Orders    None       Dorie Rank, MD 01/04/19 2258

## 2019-01-07 NOTE — Progress Notes (Addendum)
Virtual Visit via Video Note The purpose of this virtual visit is to provide medical care while limiting exposure to the novel coronavirus.    Consent was obtained for video visit:  Yes.   Answered questions that patient had about telehealth interaction:  Yes.   I discussed the limitations, risks, security and privacy concerns of performing an evaluation and management service by telemedicine. I also discussed with the patient that there may be a patient responsible charge related to this service. The patient expressed understanding and agreed to proceed.  Pt location: Home Physician Location: Home Name of referring provider:  [name]* I connected with Marylou Flesher at patients initiation/request on 01/08/2019 at  9:10 AM EDT by video enabled telemedicine application and verified that I am speaking with the correct person using two identifiers. Pt MRN:  [id number] Pt DOB:  04/02/1991 Video Participants:  Marylou Flesher   History of Present Illness:  Danielle Garcia is a 28 year old right-handed black woman who presents for migraines.  History supplemented by referring provider and ED notes.  Onset:  2013 Location:  Starts behind right eye and radiates to entire right side Quality:  Stabbing/pressure Intensity:  Usually 6/10 but lately 10/10.  She denies new headache, thunderclap headache or severe headache that wakes her from sleep. Aura:  no Premonitory Phase:  Dull ache Postdrome:  Some head sensitivity Associated symptoms:  Nausea, photophobia, phonophobia, osmophobia, .  She denies associated vomiting, visual disturbance or unilateral numbness or weakness. Duration:  1 day Frequency:  Initially 2 days a month but has steadily increased and almost daily for the past month Frequency of abortive medication: Excedrin 4 days a week Triggers:  Unknown.   Relieving factors:  rest Activity:  aggravates  She did present to the ED at [facility] on 01/04/19  for an intractable migraine lasting 2 days where she was treated with a headache cocktail of Toradol 30mg , Compazine 10mg  and Benadryl 12.5mg .  This headache was associated with blurred vision and neck pain  She reports that she is currently trying to get pregnant.  Current NSAIDS:  None Current analgesics:  Excedrin Migraine Current triptans:  none Current ergotamine:  none Current anti-emetic:  none Current muscle relaxants:  none Current anti-anxiolytic:  none Current sleep aide:  none Current Antihypertensive medications:  none Current Antidepressant medications:  none Current Anticonvulsant medications:  none Current anti-CGRP:  none Current Vitamins/Herbal/Supplements:  none Current Antihistamines/Decongestants:  Xyzal Other therapy:  none Hormone/birth control:  none  Past NSAIDS:  Ibuprofen, Mobic, naproxen Past analgesics:  Tylenol #3, tramadol, Tylenol Past abortive triptans:  Maxalt, sumatriptan 100mg , Relpax (triptans typically make her not feel well) Past abortive ergotamine:  none Past muscle relaxants:  Flexeril, Skelaxin, tizanidine Past anti-emetic:  Zofran Past antihypertensive medications:  propranolol ER 80mg , Lisinopril-HCTZ, metoprolol Past antidepressant medications:  Lexapro, Prozac Past anticonvulsant medications:  none Past anti-CGRP:  none Past vitamins/Herbal/Supplements:  none Past antihistamines/decongestants:  Flonase, Nasonex Other past therapies:  none  Caffeine:  1 cup coffee 3 times a week Diet:  Cut down on sweets. Exercise:  Not routine Depression:  no; Anxiety:  no Other pain:  no Sleep hygiene:  poor Family history of headache:  Maternal grandmother  CBC and CMP from 09/24/18 were unremarkable.  Past Medical History: Past Medical History:  Diagnosis Date  . Anxiety   . Asthma   . Back pain   . Depression   . Fatigue   . Floaters   .  GERD (gastroesophageal reflux disease)   . HTN (hypertension)   . Migraine   . Neck pain    . Pre-diabetes   . Shortness of breath   . Trouble in sleeping     Medications: Outpatient Encounter Medications as of 01/08/2019  Medication Sig  . acetaminophen (TYLENOL) 500 MG tablet Take 2 tablets (1,000 mg total) by mouth every 6 (six) hours as needed for moderate pain or fever.  . eletriptan (RELPAX) 20 MG tablet Take 1 tablet (20 mg total) by mouth as needed for migraine or headache. May repeat in 2 hours if headache persists or recurs.  Marland Kitchen levocetirizine (XYZAL) 5 MG tablet Take 1 tablet (5 mg total) by mouth every evening.  . metFORMIN (GLUCOPHAGE-XR) 500 MG 24 hr tablet Take 1 tablet (500 mg total) by mouth daily with breakfast.  . naproxen (NAPROSYN) 500 MG tablet Take 1 tablet (500 mg total) by mouth 2 (two) times daily with a meal.  . omeprazole (PRILOSEC) 20 MG capsule Take 1 capsule (20 mg total) by mouth 2 (two) times daily before a meal.  . ondansetron (ZOFRAN) 4 MG tablet Take 1 tablet (4 mg total) by mouth every 8 (eight) hours as needed for nausea.  . Phentermine-Topiramate 3.75-23 MG CP24 Take 1 capsule by mouth daily.  . pimecrolimus (ELIDEL) 1 % cream Apply topically 2 (two) times daily.  Marland Kitchen tiZANidine (ZANAFLEX) 4 MG tablet Take 0.5-1 tablets (2-4 mg total) by mouth every 8 (eight) hours as needed for muscle spasms.  . valACYclovir (VALTREX) 1000 MG tablet Take 1 tab daily for 5 days during outbreaks.  . Vitamin D, Ergocalciferol, (DRISDOL) 1.25 MG (50000 UT) CAPS capsule Take 1 capsule (50,000 Units total) by mouth every 7 (seven) days.   No facility-administered encounter medications on file as of 01/08/2019.     Allergies: No Known Allergies  Family History: Family History  Problem Relation Age of Onset  . Hypertension Mother   . Heart attack Father 44  . Obesity Father   . Hypertension Maternal Grandmother   . Diabetes Maternal Grandmother   . Lung cancer Maternal Grandfather   . Diabetes Paternal Grandmother   . Allergies Daughter     Social History:  Social History   Socioeconomic History  . Marital status: Married    Spouse name: Peri Jefferson  . Number of children: 1  . Years of education: Not on file  . Highest education level: Not on file  Occupational History  . Occupation: CMA    Employer: [employer]  . Financial resource strain: Not on file  . Food insecurity    Worry: Not on file    Inability: Not on file  . Transportation needs    Medical: Not on file    Non-medical: Not on file  Tobacco Use  . Smoking status: Never Smoker  . Smokeless tobacco: Never Used  Substance and Sexual Activity  . Alcohol use: Yes    Comment: occasionally  . Drug use: No  . Sexual activity: Yes    Birth control/protection: None  Lifestyle  . Physical activity    Days per week: Not on file    Minutes per session: Not on file  . Stress: Not on file  Relationships  . Social Herbalist on phone: Not on file    Gets together: Not on file    Attends religious service: Not on file    Active member of club or organization: Not on  file    Attends meetings of clubs or organizations: Not on file    Relationship status: Not on file  . Intimate partner violence    Fear of current or ex partner: Not on file    Emotionally abused: Not on file    Physically abused: Not on file    Forced sexual activity: Not on file  Other Topics Concern  . Not on file  Social History Narrative  . Not on file    Observations/Objective:   Height 5\' 3"  (1.6 m), weight 254 lb (115.2 kg), last menstrual period 12/16/2018. No acute distress. Alert and oriented. Speech fluent and not dysarthric.  Language intact.  Face symmetric    Assessment and Plan:   Migraine without aura, without status migrainosus, not intractable.  Worsening headaches over past month (from 2 times a month to almost daily).    1. As she has suddenly had near daily headaches, will check MRI of brain with and without contrast  2. For preventative management,  advised to start magnesium citrate 400mg  daily.  I do not want to start topiramate as she is currently trying to get pregnant. I will reach out to her OBGYN to see if we can start nortriptyline.  ADDENDUM:  As per her OBGYN, we can start nortriptyline 10mg  at bedtime.  If she should become pregnant we may need to adjust dose.  Start nortriptyline 10mg  at bedtime.  We can increase dose to 25mg  at bedtime in 4 weeks if needed. 3.  For abortive therapy, Excedrin 4.  Limit use of pain relievers to no more than 2 days out of week to prevent risk of rebound or medication-overuse headache. 5.  Keep headache diary 6.  Exercise, hydration, caffeine cessation, sleep hygiene, monitor for and avoid triggers 7.  Consider:  magnesium citrate 400mg  daily, riboflavin 400mg  daily, and coenzyme Q10 100mg  three times daily 8. Always keep in mind that currently taking a hormone or birth control may be a possible trigger or aggravating factor for migraine. 9. Follow up 4 months   Follow Up Instructions:    -I discussed the assessment and treatment plan with the patient. The patient was provided an opportunity to ask questions and all were answered. The patient agreed with the plan and demonstrated an understanding of the instructions.   The patient was advised to call back or seek an in-person evaluation if the symptoms worsen or if the condition fails to improve as anticipated.   Dudley Major, DO

## 2019-01-08 ENCOUNTER — Telehealth: Payer: Self-pay | Admitting: Neurology

## 2019-01-08 ENCOUNTER — Other Ambulatory Visit: Payer: Self-pay

## 2019-01-08 ENCOUNTER — Telehealth (INDEPENDENT_AMBULATORY_CARE_PROVIDER_SITE_OTHER): Payer: 59 | Admitting: Neurology

## 2019-01-08 ENCOUNTER — Encounter: Payer: Self-pay | Admitting: Neurology

## 2019-01-08 ENCOUNTER — Encounter: Payer: Self-pay | Admitting: Family Medicine

## 2019-01-08 ENCOUNTER — Ambulatory Visit (INDEPENDENT_AMBULATORY_CARE_PROVIDER_SITE_OTHER): Payer: 59 | Admitting: Family Medicine

## 2019-01-08 VITALS — Ht 63.0 in | Wt 254.0 lb

## 2019-01-08 VITALS — BP 128/82 | HR 82 | Temp 98.7°F | Ht 63.0 in

## 2019-01-08 DIAGNOSIS — R51 Headache: Secondary | ICD-10-CM

## 2019-01-08 DIAGNOSIS — B373 Candidiasis of vulva and vagina: Secondary | ICD-10-CM

## 2019-01-08 DIAGNOSIS — R519 Headache, unspecified: Secondary | ICD-10-CM

## 2019-01-08 DIAGNOSIS — H6981 Other specified disorders of Eustachian tube, right ear: Secondary | ICD-10-CM | POA: Diagnosis not present

## 2019-01-08 DIAGNOSIS — B3731 Acute candidiasis of vulva and vagina: Secondary | ICD-10-CM

## 2019-01-08 DIAGNOSIS — G43009 Migraine without aura, not intractable, without status migrainosus: Secondary | ICD-10-CM

## 2019-01-08 MED ORDER — PREDNISONE 20 MG PO TABS: 40.0000 mg | ORAL_TABLET | Freq: Every day | ORAL | 0 refills | Status: AC

## 2019-01-08 MED ORDER — LEVOCETIRIZINE DIHYDROCHLORIDE 5 MG PO TABS: 5.0000 mg | ORAL_TABLET | Freq: Every evening | ORAL | 2 refills | Status: DC

## 2019-01-08 MED ORDER — MOMETASONE FUROATE 50 MCG/ACT NA SUSP: 2.0000 | Freq: Every day | NASAL | 12 refills | Status: DC

## 2019-01-08 MED ORDER — FLUCONAZOLE 150 MG PO TABS: 150.0000 mg | ORAL_TABLET | Freq: Once | ORAL | 0 refills | Status: DC

## 2019-01-08 MED FILL — FLUCONAZOLE 150 MG TABS: 150 | 1 days supply | Qty: 1 | Fill #0

## 2019-01-08 MED FILL — predniSONE 20 MG TABS: 20 | 5 days supply | Qty: 10 | Fill #0

## 2019-01-08 MED FILL — LEVOCETIRIZINE 5 MG TABLET: 5 | 30 days supply | Qty: 30 | Fill #0

## 2019-01-08 NOTE — Telephone Encounter (Signed)
Pt requesting MRI scheduled at [facility] and Do Not schedule at GI. Sooner appts available at [facility]. Pt ph [phone].

## 2019-01-08 NOTE — Telephone Encounter (Signed)
New MRI order placed

## 2019-01-08 NOTE — Telephone Encounter (Signed)
New order to the [place] for [facility]. They need a new order through Epic since the other one was already scheduled with [facility]. [facility] than the [facility] so she wants to change appt. Thanks!

## 2019-01-08 NOTE — Addendum Note (Signed)
Addended by: Amado Coe on: 01/08/2019 09:45 AM   Modules accepted: Orders

## 2019-01-08 NOTE — Addendum Note (Signed)
Addended by: Amado Coe on: 01/08/2019 12:59 PM   Modules accepted: Orders

## 2019-01-08 NOTE — Patient Instructions (Signed)
1. We will check MRI of brain with and without contrast  2. For preventative management, advised to start magnesium citrate 400mg  daily.  I do not want to start topiramate as she is currently trying to get pregnant. I will reach out to your OBGYN to see if we can start nortriptyline. 3.  For abortive therapy, Excedrin 4.  Limit use of pain relievers to no more than 2 days out of week to prevent risk of rebound or medication-overuse headache. 5.  Keep headache diary 6.  Exercise, hydration, caffeine cessation, sleep hygiene, monitor for and avoid triggers 7.  Consider:  magnesium citrate 400mg  daily, riboflavin 400mg  daily, and coenzyme Q10 100mg  three times daily 8. Always keep in mind that currently taking a hormone or birth control may be a possible trigger or aggravating factor for migraine. 9. Follow up 4 months

## 2019-01-08 NOTE — Telephone Encounter (Signed)
New order placed

## 2019-01-08 NOTE — Patient Instructions (Addendum)
Do not take the prednisone if you are pregnant.  Use the Xyzal daily with the nasal spray. The nasal spray works better if taken daily.   If the d/c does not get better, please let me know.

## 2019-01-08 NOTE — Progress Notes (Signed)
Chief Complaint  Patient presents with  . Follow-up    Subjective: Patient is a 28 y.o. female here for 2 things.  1- thinks she has a yeast infxn. Going on for a few days. Thick and white d/c, itching. No foul odor, bleeding, urinary complaints. 2- R ear pain off and on for 1 week. No inj, drainage, fevers, URI s/s's, Has not tried anything. No jaw pain or excessive gum chewing. Has allergies, but not on any meds.    ROS: Const: No fevers  Past Medical History:  Diagnosis Date  . Anxiety   . Asthma   . Back pain   . Depression   . Fatigue   . Floaters   . GERD (gastroesophageal reflux disease)   . HTN (hypertension)   . Migraine   . Neck pain   . Pre-diabetes   . Shortness of breath   . Trouble in sleeping     Objective: BP 128/82 (BP Location: Left Arm, Patient Position: Sitting, Cuff Size: Normal)   Pulse 82   Temp 98.7 F (37.1 C) (Oral)   Ht 5\' 3"  (1.6 m)   LMP 12/16/2018   BMI 44.99 kg/m  General: Awake, appears stated age HEENT: MMM, EOMi, ears neg b/l, canals patent Heart: RRR, no murmurs Lungs: CTAB, no rales, wheezes or rhonchi. No accessory muscle use Psych: Age appropriate judgment and insight, normal affect and mood  Assessment and Plan: Yeast vaginitis - Plan: fluconazole (DIFLUCAN) 150 MG tablet  Dysfunction of right eustachian tube - Plan: levocetirizine (XYZAL) 5 MG tablet, mometasone (NASONEX) 50 MCG/ACT nasal spray, predniSONE (DELTASONE) 20 MG tablet  Don't take pred if pregnant.  The patient voiced understanding and agreement to the plan.  [place], DO 01/08/19  12:03 PM

## 2019-01-13 ENCOUNTER — Other Ambulatory Visit: Payer: Self-pay

## 2019-01-13 ENCOUNTER — Telehealth: Payer: Self-pay | Admitting: Family Medicine

## 2019-01-13 ENCOUNTER — Ambulatory Visit (INDEPENDENT_AMBULATORY_CARE_PROVIDER_SITE_OTHER): Payer: 59

## 2019-01-13 ENCOUNTER — Telehealth: Payer: Self-pay

## 2019-01-13 DIAGNOSIS — R51 Headache: Secondary | ICD-10-CM | POA: Diagnosis not present

## 2019-01-13 DIAGNOSIS — B373 Candidiasis of vulva and vagina: Secondary | ICD-10-CM

## 2019-01-13 DIAGNOSIS — R519 Headache, unspecified: Secondary | ICD-10-CM

## 2019-01-13 DIAGNOSIS — G8929 Other chronic pain: Secondary | ICD-10-CM

## 2019-01-13 DIAGNOSIS — G43009 Migraine without aura, not intractable, without status migrainosus: Secondary | ICD-10-CM | POA: Diagnosis not present

## 2019-01-13 DIAGNOSIS — B3731 Acute candidiasis of vulva and vagina: Secondary | ICD-10-CM

## 2019-01-13 DIAGNOSIS — G43709 Chronic migraine without aura, not intractable, without status migrainosus: Secondary | ICD-10-CM | POA: Diagnosis not present

## 2019-01-13 MED ORDER — FLUCONAZOLE 150 MG PO TABS: 150.0000 mg | ORAL_TABLET | Freq: Once | ORAL | 0 refills | Status: AC

## 2019-01-13 MED ORDER — GADOBUTROL 1 MMOL/ML IV SOLN
10.0000 mL | Freq: Once | INTRAVENOUS | Status: AC | PRN
  Administered 2019-01-13: 10 mL via INTRAVENOUS

## 2019-01-13 MED ORDER — NORTRIPTYLINE HCL 10 MG PO CAPS: 10.0000 mg | ORAL_CAPSULE | Freq: Every day | ORAL | 3 refills | Status: DC

## 2019-01-13 MED FILL — NORTRIPTYLINE HCL 10 MG CAP: 10 | 30 days supply | Qty: 30 | Fill #0

## 2019-01-13 MED FILL — FLUCONAZOLE 150 MG TABS: 150 | 1 days supply | Qty: 1 | Fill #0

## 2019-01-13 NOTE — Telephone Encounter (Signed)
Patient is requesting one more difucan

## 2019-01-13 NOTE — Addendum Note (Signed)
Addended byTomi Likens, Jazmin Vensel R on: 01/13/2019 08:57 AM   Modules accepted: Orders

## 2019-01-13 NOTE — Telephone Encounter (Signed)
Called and spoke with Pt, advised her ok to start nortriptyline and to contact us in 4 weeks if headaches not any better

## 2019-01-13 NOTE — Telephone Encounter (Signed)
-----   Message from Pieter Partridge, DO sent at 01/13/2019  8:55 AM EDT ----- Would you contact patient to let her know that I contacted her OBGYN and she said it would be okay to start nortriptyline.  I sent in prescription to [facility].  If headaches not improved in 4 weeks, she may contact us and we can increase dose.  Thank you.

## 2019-01-14 ENCOUNTER — Ambulatory Visit (INDEPENDENT_AMBULATORY_CARE_PROVIDER_SITE_OTHER): Payer: 59 | Admitting: Psychology

## 2019-01-14 DIAGNOSIS — F4323 Adjustment disorder with mixed anxiety and depressed mood: Secondary | ICD-10-CM

## 2019-01-16 ENCOUNTER — Telehealth: Payer: Self-pay

## 2019-01-16 NOTE — Telephone Encounter (Signed)
Pt called to give MRI results MRI normal, pt verbalized understanding

## 2019-01-24 ENCOUNTER — Telehealth: Payer: Self-pay | Admitting: Family Medicine

## 2019-01-24 NOTE — Telephone Encounter (Signed)
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  She was advised not to have test today.  She states health at work advised they will be in touch with her tomorrow, and will schedule COVID 19 test if needed.

## 2019-01-24 NOTE — Telephone Encounter (Signed)
Please call this patient to be tested for Covid

## 2019-01-28 ENCOUNTER — Ambulatory Visit (INDEPENDENT_AMBULATORY_CARE_PROVIDER_SITE_OTHER): Payer: 59 | Admitting: Psychology

## 2019-01-28 DIAGNOSIS — F4323 Adjustment disorder with mixed anxiety and depressed mood: Secondary | ICD-10-CM

## 2019-02-01 ENCOUNTER — Other Ambulatory Visit: Payer: 59

## 2019-02-07 ENCOUNTER — Other Ambulatory Visit: Payer: Self-pay | Admitting: Family Medicine

## 2019-02-07 ENCOUNTER — Other Ambulatory Visit (INDEPENDENT_AMBULATORY_CARE_PROVIDER_SITE_OTHER): Payer: 59

## 2019-02-07 ENCOUNTER — Ambulatory Visit (INDEPENDENT_AMBULATORY_CARE_PROVIDER_SITE_OTHER): Payer: 59 | Admitting: Family Medicine

## 2019-02-07 ENCOUNTER — Other Ambulatory Visit: Payer: Self-pay

## 2019-02-07 ENCOUNTER — Encounter: Payer: Self-pay | Admitting: Family Medicine

## 2019-02-07 VITALS — BP 120/70 | HR 73 | Temp 98.5°F | Ht 63.0 in | Wt 250.0 lb

## 2019-02-07 DIAGNOSIS — Z0000 Encounter for general adult medical examination without abnormal findings: Secondary | ICD-10-CM

## 2019-02-07 DIAGNOSIS — R739 Hyperglycemia, unspecified: Secondary | ICD-10-CM

## 2019-02-07 DIAGNOSIS — E559 Vitamin D deficiency, unspecified: Secondary | ICD-10-CM

## 2019-02-07 LAB — COMPREHENSIVE METABOLIC PANEL
ALT: 15 U/L (ref 0–35)
AST: 15 U/L (ref 0–37)
Albumin: 4 g/dL (ref 3.5–5.2)
Alkaline Phosphatase: 43 U/L (ref 39–117)
BUN: 12 mg/dL (ref 6–23)
CO2: 27 mEq/L (ref 19–32)
Calcium: 9.1 mg/dL (ref 8.4–10.5)
Chloride: 105 mEq/L (ref 96–112)
Creatinine, Ser: 0.69 mg/dL (ref 0.40–1.20)
GFR: 121.76 mL/min (ref >60.00)
Glucose, Bld: 111 mg/dL — ABNORMAL HIGH (ref 70–99)
Potassium: 3.7 mEq/L (ref 3.5–5.1)
Sodium: 139 mEq/L (ref 135–145)
Total Bilirubin: 0.7 mg/dL (ref 0.2–1.2)
Total Protein: 6.3 g/dL (ref 6.0–8.3)

## 2019-02-07 LAB — CBC
HCT: 40.6 % (ref 36.0–46.0)
Hemoglobin: 13.4 g/dL (ref 12.0–15.0)
MCHC: 32.9 g/dL (ref 30.0–36.0)
MCV: 92.8 fl (ref 78.0–100.0)
Platelets: 308 10*3/uL (ref 150.0–400.0)
RBC: 4.37 Mil/uL (ref 3.87–5.11)
RDW: 12.6 % (ref 11.5–15.5)
WBC: 4.8 10*3/uL (ref 4.0–10.5)

## 2019-02-07 LAB — LIPID PANEL
Cholesterol: 137 mg/dL (ref 0–200)
HDL: 43.1 mg/dL (ref >39.00)
LDL Cholesterol: 77 mg/dL (ref 0–99)
NonHDL: 93.8
Total CHOL/HDL Ratio: 3
Triglycerides: 82 mg/dL (ref 0.0–149.0)
VLDL: 16.4 mg/dL (ref 0.0–40.0)

## 2019-02-07 LAB — HEMOGLOBIN A1C: Hgb A1c MFr Bld: 5.9 % (ref 4.6–6.5)

## 2019-02-07 LAB — MAGNESIUM: Magnesium: 1.7 mg/dL (ref 1.5–2.5)

## 2019-02-07 LAB — VITAMIN D 25 HYDROXY (VIT D DEFICIENCY, FRACTURES): VITD: 12.58 ng/mL — ABNORMAL LOW (ref 30.00–100.00)

## 2019-02-07 MED ORDER — ERGOCALCIFEROL 1.25 MG (50000 UT) PO CAPS: 50000.0000 [IU] | ORAL_CAPSULE | ORAL | 3 refills | Status: DC

## 2019-02-07 MED ORDER — PANTOPRAZOLE SODIUM 40 MG PO TBEC: 40.0000 mg | DELAYED_RELEASE_TABLET | Freq: Two times a day (BID) | ORAL | 3 refills | Status: DC

## 2019-02-07 MED FILL — VIT D2 1.25 MG (50,000 UNIT: 1.25 MG | 84 days supply | Qty: 12 | Fill #0

## 2019-02-07 MED FILL — PANTOPRAZOLE SOD DR 40 MG T: 40 | 90 days supply | Qty: 180 | Fill #0

## 2019-02-07 NOTE — Progress Notes (Signed)
Chief Complaint  Patient presents with  . Annual Exam     Well Woman Danielle Garcia is here for a complete physical.   Her last physical was >1 year ago.  Current diet: in general, diet has been improving Current exercise: none. Contraception? No, interested in getting pregnant No LMP recorded. Seatbelt? Yes  Health Maintenance Pap/HPV- Yes Tetanus- Yes HIV screening- Yes  Past Medical History:  Diagnosis Date  . Anxiety   . Asthma   . Back pain   . Depression   . Fatigue   . Floaters   . GERD (gastroesophageal reflux disease)   . HTN (hypertension)   . Migraine   . Neck pain   . Pre-diabetes   . Shortness of breath   . Trouble in sleeping      Past Surgical History:  Procedure Laterality Date  . CESAREAN SECTION    . DILATION AND EVACUATION N/A 07/02/2018   Procedure: DILATATION AND EVACUATION;  Surgeon: Aletha Halim, MD;  Location: [facility];  Service: Gynecology;  Laterality: N/A;  . WISDOM TOOTH EXTRACTION      Medications  Current Outpatient Medications on File Prior to Visit  Medication Sig Dispense Refill  . levocetirizine (XYZAL) 5 MG tablet Take 1 tablet (5 mg total) by mouth every evening. 30 tablet 2  . mometasone (NASONEX) 50 MCG/ACT nasal spray Place 2 sprays into the nose daily. 17 g 12  . nortriptyline (PAMELOR) 10 MG capsule Take 1 capsule (10 mg total) by mouth at bedtime. 30 capsule 3  . omeprazole (PRILOSEC) 20 MG capsule Take 1 capsule (20 mg total) by mouth 2 (two) times daily before a meal. 60 capsule 1  . valACYclovir (VALTREX) 1000 MG tablet Take 1 tab daily for 5 days during outbreaks. 30 tablet 1   Allergies No Known Allergies  Review of Systems: Constitutional:  no unexpected weight changes Eye:  no recent significant change in vision Ear/Nose/Mouth/Throat:  Ears:  no tinnitus or vertigo and no recent change in hearing Nose/Mouth/Throat:  no complaints of nasal congestion, no sore throat Cardiovascular: no chest  pain Respiratory:  no cough and no shortness of breath Gastrointestinal:  no abdominal pain, no change in bowel habits GU:  Female: negative for dysuria or pelvic pain Musculoskeletal/Extremities: +L thumb, shoulder and hand pain; otherwise no pain of the joints Integumentary (Skin/Breast):  no abnormal skin lesions reported Neurologic:  No current headaches Endocrine:  denies fatigue Hematologic/Lymphatic:  No areas of easy bleeding  Exam BP 120/70 (BP Location: Left Arm, Patient Position: Sitting, Cuff Size: Large)   Pulse 73   Temp 98.5 F (36.9 C) (Oral)   Ht 5\' 3"  (1.6 m)   Wt 250 lb (113.4 kg)   SpO2 96%   BMI 44.29 kg/m  General:  well developed, well nourished, in no apparent distress Skin:  no significant moles, warts, or growths Head:  no masses, lesions, or tenderness Eyes:  pupils equal and round, sclera anicteric without injection Ears:  canals without lesions, TMs shiny without retraction, no obvious effusion, no erythema Nose:  nares patent, septum midline, mucosa normal, and no drainage or sinus tenderness Throat/Pharynx:  lips and gingiva without lesion; tongue and uvula midline; non-inflamed pharynx; no exudates or postnasal drainage Neck: neck supple without adenopathy, thyromegaly, or masses Lungs:  clear to auscultation, breath sounds equal bilaterally, no respiratory distress Cardio:  regular rate and rhythm, no bruits, no LE edema Abdomen:  abdomen soft, nontender; bowel sounds normal; no masses or organomegaly Genital:  Defer to GYN Musculoskeletal: +ttp over [facility] jt on L, neg [place], Neer's, Cross over, [place] over Obrien's, lift off, speed's; +ttp over 1st MCP, symmetrical muscle groups noted without atrophy or deformity Extremities:  no clubbing, cyanosis, or edema, no deformities, no skin discoloration Neuro:  gait normal; deep tendon reflexes normal and symmetric; neg Spurling's, Tinel's of wrist, Phalen's of wrist Psych: well oriented with normal range of  affect and appropriate judgment/insight  Assessment and Plan  Well adult exam - Plan: CBC, Comprehensive metabolic panel, Lipid panel, Vitamin D (25 hydroxy), Magnesium  Well 28 y.o. female. Counseled on diet and exercise. Other orders as above. Follow up in 1 yr or prn. The patient voiced understanding and agreement to the plan.  [place], DO 02/07/19 7:53 AM

## 2019-02-07 NOTE — Patient Instructions (Addendum)
Give [place] 2-3 business days to get the results of your labs back.   Keep the diet clean and stay active.  Consider a spoonful of pickle juice before bedtime.  Stay hydrated. Consider something with electrolytes like low calorie Gatorade to see if it helps with your cramping.   Invest in a thumb spica splint to wear at night and if you are using your left hand more freq. If no improvement over the next several weeks, let me know and we will get you in with the sports med team.   Let us know if you need anything.   Acromioclavicular Separation Rehab It is normal to feel mild stretching, pulling, tightness, or discomfort as you do these exercises, but you should stop right away if you feel sudden pain or your pain gets worse.  Stretching and range of motion exercises These exercises warm up your muscles and joints and improve the movement and flexibility of your shoulder. These exercises also help to relieve pain and stiffness. Exercise A: Pendulum  1. Stand near a wall or a surface that you can hold onto for balance. 2. Bend forward at the waist and let your left / right arm hang straight down. Use your other arm to keep your balance. 3. Relax your arm and shoulder muscles, and move your hips and your trunk so your left / right arm swings freely. Your arm should swing because of the motion of your body, not because you are using your arm or shoulder muscles. 4. Keep moving so your arm swings in the following directions, as told by your health care provider: ? Side to side. ? Forward and backward. ? In clockwise and counterclockwise circles. 5. Slowly return to the starting position. Repeat 2 times, or for 30 seconds per direction. Complete this exercise 3 times per week. Exercise B: Flexion, seated  1. Sit in a stable chair and rest your left / right forearm on a flat surface. Your elbow should rest at a height that keeps your upper arm next to your body. 2. Keeping your left / right  shoulder relaxed, lean forward at the waist and slide your hand forward until you feel a stretch in your shoulder. You can move your chair farther away from the surface to increase the stretch, if needed. 3. Hold for 30 seconds. 4. Slowly return to the starting position. Repeat 2 times. Complete this exercise 3 times per week.  Strengthening exercises These exercises build strength and endurance in your shoulder. Endurance is the ability to use your muscles for a long time, even after they get tired. Exercise C: Scapular retraction  1. Sit in a stable chair without armrests, or stand. 2. Secure an exercise band to a stable object in front of you so the band is at shoulder height. 3. Hold one end of the exercise band in each hand. 4. Squeeze your shoulder blades together and move your elbows slightly behind you. Do not shrug your shoulders while you do this. 5. Hold for 3 seconds. 6. Slowly return to the starting position. Repeat 2 times. Complete this exercise 3 times per week. Exercise D: Shoulder abduction 1. Sit in a stable chair without arm rests, or stand. 2. Hold a 5 weight in your left / right hand. 3. Start with your arms straight down. Turn your left / right hand so your palm faces in, toward your body. 4. Slowly lift your right / left hand out to your side. Do not lift your hand  above shoulder height. ? Keep your arms straight. ? Avoid shrugging your shoulder while you do this movement. Keep your shoulder blade tucked down toward the middle of your back. 5. Hold for 3 seconds. 6. Slowly lower your arm and return to the starting position. Repeat 2 times. Complete this exercise 3 times per week. Exercise E: Scapular protraction, standing  1. Stand so you are facing a wall. Place your feet about one arm-length away from the wall. 2. Place your hands on the wall and straighten your elbows. 3. Keep your hands on the wall as you push your upper back away from the wall. You should  feel your shoulder blades sliding forward around your rib cage. Keep your elbows and your head still. 4. Hold for 3 seconds. 5. Slowly return to the starting position. Let your muscles relax completely before you repeat this exercise. Repeat 2 times. Complete this exercise 3 times per week.  This information is not intended to replace advice given to you by your health care provider. Make sure you discuss any questions you have with your health care provider. Document Released: 07/03/2005 Document Revised: 03/09/2016 Document Reviewed: 06/05/2015 Elsevier Interactive Patient Education  [employer].

## 2019-02-07 NOTE — Progress Notes (Signed)
Vitamin D

## 2019-02-10 ENCOUNTER — Other Ambulatory Visit: Payer: Self-pay | Admitting: Family Medicine

## 2019-02-10 DIAGNOSIS — R358 Other polyuria: Secondary | ICD-10-CM

## 2019-02-10 DIAGNOSIS — R3589 Other polyuria: Secondary | ICD-10-CM

## 2019-02-13 ENCOUNTER — Ambulatory Visit (INDEPENDENT_AMBULATORY_CARE_PROVIDER_SITE_OTHER): Payer: 59 | Admitting: Psychology

## 2019-02-13 DIAGNOSIS — F4323 Adjustment disorder with mixed anxiety and depressed mood: Secondary | ICD-10-CM

## 2019-02-27 ENCOUNTER — Ambulatory Visit: Payer: 59 | Admitting: Psychology

## 2019-03-10 ENCOUNTER — Ambulatory Visit (INDEPENDENT_AMBULATORY_CARE_PROVIDER_SITE_OTHER): Payer: 59 | Admitting: Psychology

## 2019-03-10 DIAGNOSIS — F4323 Adjustment disorder with mixed anxiety and depressed mood: Secondary | ICD-10-CM

## 2019-03-12 ENCOUNTER — Ambulatory Visit: Payer: 59 | Admitting: Obstetrics & Gynecology

## 2019-03-12 DIAGNOSIS — Z01419 Encounter for gynecological examination (general) (routine) without abnormal findings: Secondary | ICD-10-CM

## 2019-04-04 ENCOUNTER — Ambulatory Visit (INDEPENDENT_AMBULATORY_CARE_PROVIDER_SITE_OTHER): Payer: 59 | Admitting: Psychology

## 2019-04-04 DIAGNOSIS — F4323 Adjustment disorder with mixed anxiety and depressed mood: Secondary | ICD-10-CM

## 2019-04-14 ENCOUNTER — Other Ambulatory Visit: Payer: Self-pay | Admitting: Family Medicine

## 2019-04-14 MED ORDER — CETIRIZINE HCL 10 MG PO TABS: 10.0000 mg | ORAL_TABLET | Freq: Every day | ORAL | 3 refills | Status: DC

## 2019-04-14 MED ORDER — FLUTICASONE PROPIONATE 50 MCG/ACT NA SUSP: 2.0000 | Freq: Every day | NASAL | 3 refills | Status: DC

## 2019-04-14 MED FILL — FLUTICASONE PROP 50 MCG SPR: 50 | 30 days supply | Qty: 16 | Fill #0

## 2019-04-14 MED FILL — CETIRIZINE HCL 10 MG TABS: 10 | 100 days supply | Qty: 100 | Fill #0

## 2019-04-15 ENCOUNTER — Encounter: Payer: Self-pay | Admitting: Family Medicine

## 2019-04-15 ENCOUNTER — Other Ambulatory Visit: Payer: Self-pay

## 2019-04-15 ENCOUNTER — Ambulatory Visit (INDEPENDENT_AMBULATORY_CARE_PROVIDER_SITE_OTHER): Payer: 59 | Admitting: Family Medicine

## 2019-04-15 VITALS — BP 118/80 | HR 90 | Temp 99.0°F | Ht 63.0 in | Wt 261.2 lb

## 2019-04-15 DIAGNOSIS — R0789 Other chest pain: Secondary | ICD-10-CM | POA: Diagnosis not present

## 2019-04-15 MED ORDER — ALBUTEROL SULFATE HFA 108 (90 BASE) MCG/ACT IN AERS: 2.0000 | INHALATION_SPRAY | Freq: Four times a day (QID) | RESPIRATORY_TRACT | 2 refills | Status: DC | PRN

## 2019-04-15 MED ORDER — FAMOTIDINE 20 MG PO TABS: 20.0000 mg | ORAL_TABLET | Freq: Two times a day (BID) | ORAL | 1 refills | Status: DC

## 2019-04-15 NOTE — Patient Instructions (Addendum)
Let's add Pepcid 20 mg twice daily to the Protonix.  The only lifestyle changes that have data behind them are weight loss for the overweight/obese and elevating the head of the bed. Finding out which foods/positions are triggers is important.  Let's try an inhaler as well.  This is unlikely to be related to your heart.                                                                       Let us know if you need anything.

## 2019-04-15 NOTE — Progress Notes (Signed)
Chief Complaint  Patient presents with  . Chest Pain    Danielle Garcia is a 28 y.o. female here for evaluation of chest pain.  Duration of issue: 1 month Quality: dull Palliation: nothing Provocation: walking,  Severity: 4/10 Radiation: to back Duration of chest pain: 1-2 hrs Associated symptoms: sob/panting Cardiac history: none Family heart history: dad w MI at 25 Smoker? No  No recent immobility or calf pain.  She does not routinely exercise but notes that this is not exacerbated by exertion.  She has gained weight recently unfortunately.  ROS:  Cardiac: +chest pain  Past Medical History:  Diagnosis Date  . Anxiety   . Asthma   . Back pain   . Depression   . Fatigue   . Floaters   . GERD (gastroesophageal reflux disease)   . HTN (hypertension)   . Migraine   . Neck pain   . Pre-diabetes   . Shortness of breath   . Trouble in sleeping    BP 118/80 (BP Location: Left Arm, Patient Position: Sitting, Cuff Size: Large)   Pulse 90   Temp 99 F (37.2 C) (Oral)   Ht 5\' 3"  (1.6 m)   Wt 261 lb 4 oz (118.5 kg)   SpO2 98%   BMI 46.28 kg/m  Gen: awake, alert, appears stated age HEENT: PERRLA, MMM Neck: No masses or asymmetry Heart: RRR, no bruits, no LE edema Lungs: CTAB, no accessory muscle use Abd: Soft, NT, ND, no masses or organomegaly MSK: chest pain is not reproducible to palpation; no calf pain bilaterally Psych: Age appropriate judgment and insight, nml mood and affect  Atypical chest pain - Plan: EKG 12-Lead, famotidine (PEPCID) 20 MG tablet, albuterol (VENTOLIN HFA) 108 (90 Base) MCG/ACT inhaler  With recent weight gain, likely related to exacerbation of reflux.  We will add Pepcid.  This may help with her asthma as well if the reflux is flaring that up.  We will add a short acting beta agonist in the meanwhile. If no improvement will consider echo versus cardiology referral. F/u prn. The patient voiced understanding and agreement to the  plan.  [place], DO 04/15/19 4:40 PM

## 2019-04-24 ENCOUNTER — Other Ambulatory Visit: Payer: Self-pay

## 2019-04-24 ENCOUNTER — Encounter (INDEPENDENT_AMBULATORY_CARE_PROVIDER_SITE_OTHER): Payer: Self-pay | Admitting: Physician Assistant

## 2019-04-24 ENCOUNTER — Ambulatory Visit (INDEPENDENT_AMBULATORY_CARE_PROVIDER_SITE_OTHER): Payer: 59 | Admitting: Physician Assistant

## 2019-04-24 VITALS — BP 132/83 | HR 69 | Temp 98.4°F | Ht 63.0 in | Wt 254.0 lb

## 2019-04-24 DIAGNOSIS — Z6841 Body Mass Index (BMI) 40.0 and over, adult: Secondary | ICD-10-CM

## 2019-04-24 DIAGNOSIS — E559 Vitamin D deficiency, unspecified: Secondary | ICD-10-CM | POA: Diagnosis not present

## 2019-04-24 NOTE — Progress Notes (Signed)
Office: [phone]  /  Fax: [phone]   HPI:   Chief Complaint: OBESITY Danielle Garcia is here to discuss her progress with her obesity treatment plan. She is on the Category 3 plan and is following her eating plan approximately 0% of the time. She states she is exercising 0 minutes 0 times per week. Danielle Garcia has not been following the plan since February. She is ready to restart.  Her weight is 254 lb (115.2 kg) today and has had a weight gain of 8 lbs since her last visit 09/12/2018. She has lost 0 lbs since starting treatment with [place].  Vitamin D deficiency Danielle Garcia has a diagnosis of Vitamin D deficiency. She is currently taking Vit D and denies nausea, vomiting or muscle weakness.  ASSESSMENT AND PLAN:  Vitamin D deficiency  Class 3 severe obesity with serious comorbidity and body mass index (BMI) of 45.0 to 49.9 in adult, unspecified obesity type ([place])  PLAN:  Vitamin D Deficiency Danielle Garcia was informed that low Vitamin D levels contributes to fatigue and are associated with obesity, breast, and colon cancer. She agrees to continue taking Vit D and will follow-up for routine testing of Vitamin D, at least 2-3 times per year. She was informed of the risk of over-replacement of Vitamin D and agrees to not increase her dose unless she discusses this with [place] first. Danielle Garcia agrees to follow-up with our clinic in 2 weeks.  I spent > than 50% of the 25 minute visit on counseling as documented in the note.  Obesity Danielle Garcia is currently in the action stage of change. As such, her goal is to continue with weight loss efforts. She has agreed to follow the Category 3 plan. Danielle Garcia has been instructed to work up to a goal of 150 minutes of combined cardio and strengthening exercise per week for weight loss and overall health benefits. We discussed the following Behavioral Modification Strategies today: work on meal planning and easy cooking plans, and keeping healthy foods in the  home.  Danielle Garcia has agreed to follow-up with our clinic in 2 weeks. She was informed of the importance of frequent follow-up visits to maximize her success with intensive lifestyle modifications for her multiple health conditions.  I spent > than 50% of the 25 minute visit on counseling as documented in the note.    ALLERGIES: No Known Allergies  MEDICATIONS: Current Outpatient Medications on File Prior to Visit  Medication Sig Dispense Refill   albuterol (VENTOLIN HFA) 108 (90 Base) MCG/ACT inhaler Inhale 2 puffs into the lungs every 6 (six) hours as needed for wheezing or shortness of breath. 18 g 2   cetirizine (ZYRTEC) 10 MG tablet Take 1 tablet (10 mg total) by mouth daily. 90 tablet 3   ergocalciferol (VITAMIN D2) 1.25 MG (50000 UT) capsule Take 1 capsule (50,000 Units total) by mouth once a week. 12 capsule 3   famotidine (PEPCID) 20 MG tablet Take 1 tablet (20 mg total) by mouth 2 (two) times daily. 60 tablet 1   fluticasone (FLONASE) 50 MCG/ACT nasal spray Place 2 sprays into both nostrils daily. 16 g 3   levocetirizine (XYZAL) 5 MG tablet Take 1 tablet (5 mg total) by mouth every evening. 30 tablet 2   mometasone (NASONEX) 50 MCG/ACT nasal spray Place 2 sprays into the nose daily. 17 g 12   nortriptyline (PAMELOR) 10 MG capsule Take 1 capsule (10 mg total) by mouth at bedtime. 30 capsule 3   pantoprazole (PROTONIX) 40 MG tablet Take 1  tablet (40 mg total) by mouth 2 (two) times daily before a meal. 60 tablet 3   valACYclovir (VALTREX) 1000 MG tablet Take 1 tab daily for 5 days during outbreaks. 30 tablet 1   No current facility-administered medications on file prior to visit.     PAST MEDICAL HISTORY: Past Medical History:  Diagnosis Date   Anxiety    Asthma    Back pain    Depression    Fatigue    Floaters    GERD (gastroesophageal reflux disease)    HTN (hypertension)    Migraine    Neck pain    Pre-diabetes    Shortness of breath     Trouble in sleeping     PAST SURGICAL HISTORY: Past Surgical History:  Procedure Laterality Date   CESAREAN SECTION     DILATION AND EVACUATION N/A 07/02/2018   Procedure: DILATATION AND EVACUATION;  Surgeon: Aletha Halim, MD;  Location: [facility];  Service: Gynecology;  Laterality: N/A;   WISDOM TOOTH EXTRACTION      SOCIAL HISTORY: Social History   Tobacco Use   Smoking status: Never Smoker   Smokeless tobacco: Never Used  Substance Use Topics   Alcohol use: Yes    Comment: occasionally   Drug use: No    FAMILY HISTORY: Family History  Problem Relation Age of Onset   Hypertension Mother    Heart attack Father 5   Obesity Father    Hypertension Maternal Grandmother    Diabetes Maternal Grandmother    Lung cancer Maternal Grandfather    Diabetes Paternal Grandmother    Allergies Daughter    ROS: Review of Systems  Gastrointestinal: Negative for nausea and vomiting.  Musculoskeletal:       Negative for muscle weakness.   PHYSICAL EXAM: Blood pressure 132/83, pulse 69, temperature 98.4 F (36.9 C), temperature source Oral, height 5\' 3"  (1.6 m), weight 254 lb (115.2 kg), SpO2 98 %. Body mass index is 44.99 kg/m. Physical Exam Vitals signs reviewed.  Constitutional:      Appearance: Normal appearance. She is obese.  Cardiovascular:     Rate and Rhythm: Normal rate.     Pulses: Normal pulses.  Pulmonary:     Effort: Pulmonary effort is normal.     Breath sounds: Normal breath sounds.  Musculoskeletal: Normal range of motion.  Skin:    General: Skin is warm and dry.  Neurological:     Mental Status: She is alert and oriented to person, place, and time.  Psychiatric:        Behavior: Behavior normal.   RECENT LABS AND TESTS: BMET    Component Value Date/Time   NA 139 02/07/2019 0752   NA 138 02/05/2018 1120   K 3.7 02/07/2019 0752   CL 105 02/07/2019 0752   CO2 27 02/07/2019 0752   GLUCOSE 111 (H) 02/07/2019 0752   BUN 12 02/07/2019  0752   BUN 10 02/05/2018 1120   CREATININE 0.69 02/07/2019 0752   CALCIUM 9.1 02/07/2019 0752   GFRNONAA >60 07/06/2018 0030   GFRAA >60 07/06/2018 0030   Lab Results  Component Value Date   HGBA1C 5.9 02/07/2019   HGBA1C 5.7 (H) 02/05/2018   HGBA1C 5.5 06/13/2017   HGBA1C 5.4 10/26/2016   Lab Results  Component Value Date   INSULIN 11.8 02/05/2018   CBC    Component Value Date/Time   WBC 4.8 02/07/2019 0752   RBC 4.37 02/07/2019 0752   HGB 13.4 02/07/2019 0752   HGB  12.8 06/10/2018 0929   HCT 40.6 02/07/2019 0752   HCT 38.4 06/10/2018 0929   PLT 308.0 02/07/2019 0752   PLT 346 06/10/2018 0929   MCV 92.8 02/07/2019 0752   MCV 91 06/10/2018 0929   MCH 30.0 07/06/2018 0030   MCHC 32.9 02/07/2019 0752   RDW 12.6 02/07/2019 0752   RDW 12.1 (L) 06/10/2018 0929   LYMPHSABS 3.0 09/24/2018 1246   LYMPHSABS 2.5 06/10/2018 0929   MONOABS 0.6 09/24/2018 1246   EOSABS 0.1 09/24/2018 1246   EOSABS 0.3 06/10/2018 0929   BASOSABS 0.0 09/24/2018 1246   BASOSABS 0.0 06/10/2018 0929   Iron/TIBC/Ferritin/ %Sat No results found for: IRON, TIBC, FERRITIN, IRONPCTSAT Lipid Panel     Component Value Date/Time   CHOL 137 02/07/2019 0752   CHOL 138 02/05/2018 1120   TRIG 82.0 02/07/2019 0752   HDL 43.10 02/07/2019 0752   HDL 47 02/05/2018 1120   CHOLHDL 3 02/07/2019 0752   VLDL 16.4 02/07/2019 0752   LDLCALC 77 02/07/2019 0752   LDLCALC 74 02/05/2018 1120   Hepatic Function Panel     Component Value Date/Time   PROT 6.3 02/07/2019 0752   PROT 6.8 02/05/2018 1120   ALBUMIN 4.0 02/07/2019 0752   ALBUMIN 4.0 02/05/2018 1120   AST 15 02/07/2019 0752   ALT 15 02/07/2019 0752   ALKPHOS 43 02/07/2019 0752   BILITOT 0.7 02/07/2019 0752   BILITOT 0.6 02/05/2018 1120      Component Value Date/Time   TSH 1.060 02/05/2018 1120   TSH 0.47 05/14/2017 1344   Results for ENA, FRICKEY (MRN [id number]) as of 04/24/2019 10:32  Ref. Range 02/07/2019 07:52  VITD Latest Ref Range:  30.00 - 100.00 ng/mL 12.58 (L)   OBESITY BEHAVIORAL INTERVENTION VISIT  Today's visit was #7  Starting weight: 243 lbs Starting date: 02/05/2018 Today's weight: 254 lbs Today's date: 04/24/2019 Total lbs lost to date: 0    04/24/2019  Height 5\' 3"  (1.6 m)  Weight 254 lb (115.2 kg)  BMI (Calculated) 45.01  BLOOD PRESSURE - SYSTOLIC [id number]  BLOOD PRESSURE - DIASTOLIC 83   Body Fat % 123XX123 %  Total Body Water (lbs) 94.4 lbs   ASK: We discussed the diagnosis of obesity with Danielle Garcia today and Danielle Garcia agreed to give [place] permission to discuss obesity behavioral modification therapy today.  ASSESS: Danielle Garcia has the diagnosis of obesity and her BMI today is 45.1. Danielle Garcia is in the action stage of change.   ADVISE: Danielle Garcia was educated on the multiple health risks of obesity as well as the benefit of weight loss to improve her health. She was advised of the need for long term treatment and the importance of lifestyle modifications to improve her current health and to decrease her risk of future health problems.  AGREE: Multiple dietary modification options and treatment options were discussed and  Danielle Garcia agreed to follow the recommendations documented in the above note.  ARRANGE: Danielle Garcia was educated on the importance of frequent visits to treat obesity as outlined per CMS and USPSTF guidelines and agreed to schedule her next follow up appointment today.  Migdalia Dk, am acting as transcriptionist for Abby Potash, PA-C I, Abby Potash, PA-C have reviewed above note and agree with its content

## 2019-04-30 ENCOUNTER — Ambulatory Visit (INDEPENDENT_AMBULATORY_CARE_PROVIDER_SITE_OTHER): Payer: 59 | Admitting: Psychology

## 2019-04-30 DIAGNOSIS — F4323 Adjustment disorder with mixed anxiety and depressed mood: Secondary | ICD-10-CM | POA: Diagnosis not present

## 2019-05-08 MED FILL — valACYclovir HCL 1 GM TABS: 1 | 30 days supply | Qty: 30 | Fill #1

## 2019-05-11 ENCOUNTER — Encounter (INDEPENDENT_AMBULATORY_CARE_PROVIDER_SITE_OTHER): Payer: Self-pay | Admitting: Physician Assistant

## 2019-05-12 ENCOUNTER — Ambulatory Visit (INDEPENDENT_AMBULATORY_CARE_PROVIDER_SITE_OTHER): Payer: 59 | Admitting: Physician Assistant

## 2019-05-14 ENCOUNTER — Ambulatory Visit (INDEPENDENT_AMBULATORY_CARE_PROVIDER_SITE_OTHER): Payer: 59 | Admitting: Psychology

## 2019-05-14 DIAGNOSIS — F4323 Adjustment disorder with mixed anxiety and depressed mood: Secondary | ICD-10-CM | POA: Diagnosis not present

## 2019-06-02 ENCOUNTER — Ambulatory Visit (INDEPENDENT_AMBULATORY_CARE_PROVIDER_SITE_OTHER): Payer: 59 | Admitting: Psychology

## 2019-06-02 DIAGNOSIS — F4323 Adjustment disorder with mixed anxiety and depressed mood: Secondary | ICD-10-CM

## 2019-06-04 ENCOUNTER — Other Ambulatory Visit: Payer: Self-pay | Admitting: Family Medicine

## 2019-06-04 ENCOUNTER — Other Ambulatory Visit: Payer: Self-pay | Admitting: Obstetrics & Gynecology

## 2019-06-04 MED ORDER — KETOCONAZOLE 2 % EX CREA: 1.0000 "application " | TOPICAL_CREAM | Freq: Every day | CUTANEOUS | 3 refills | Status: DC

## 2019-06-04 MED ORDER — LIDOCAINE HCL 2 % EX GEL: 1.0000 "application " | CUTANEOUS | 2 refills | Status: DC | PRN

## 2019-06-04 MED FILL — KETOCONAZOLE 2% CREAM: 2 | 15 days supply | Qty: 30 | Fill #0

## 2019-06-04 MED FILL — LIDOCAINE HCL 2% JELLY: 2 | 15 days supply | Qty: 30 | Fill #0

## 2019-06-26 ENCOUNTER — Ambulatory Visit: Payer: 59 | Admitting: Psychology

## 2019-07-04 ENCOUNTER — Other Ambulatory Visit: Payer: Self-pay

## 2019-07-04 ENCOUNTER — Encounter: Payer: Self-pay | Admitting: Family Medicine

## 2019-07-04 ENCOUNTER — Ambulatory Visit (INDEPENDENT_AMBULATORY_CARE_PROVIDER_SITE_OTHER): Payer: 59 | Admitting: Psychology

## 2019-07-04 ENCOUNTER — Ambulatory Visit (INDEPENDENT_AMBULATORY_CARE_PROVIDER_SITE_OTHER): Payer: 59 | Admitting: Family Medicine

## 2019-07-04 VITALS — BP 130/84 | HR 91 | Temp 97.6°F | Wt 256.0 lb

## 2019-07-04 DIAGNOSIS — F4323 Adjustment disorder with mixed anxiety and depressed mood: Secondary | ICD-10-CM

## 2019-07-04 DIAGNOSIS — M7751 Other enthesopathy of right foot: Secondary | ICD-10-CM

## 2019-07-04 MED ORDER — MELOXICAM 15 MG PO TABS: 15.0000 mg | ORAL_TABLET | Freq: Every day | ORAL | 0 refills | Status: DC

## 2019-07-04 MED FILL — MELOXICAM 15 MG TABLET: 15 | 30 days supply | Qty: 30 | Fill #0

## 2019-07-04 NOTE — Patient Instructions (Addendum)
Ice/cold pack over area for 10-15 min twice daily.  Heat (pad or rice pillow in microwave) over affected area, 10-15 minutes twice daily.   OK to take Tylenol 1000 mg (2 extra strength tabs) or 975 mg (3 regular strength tabs) every 6 hours as needed.  Ankle Exercises It is normal to feel mild stretching, pulling, tightness, or discomfort as you do these exercises, but you should stop right away if you feel sudden pain or your pain gets worse.  Stretching and range of motion exercises These exercises warm up your muscles and joints and improve the movement and flexibility of your ankle. These exercises also help to relieve pain, numbness, and tingling. Exercise A: Dorsiflexion/Plantar Flexion   1. Sit with your affected knee straight or bent. Do not rest your foot on anything. 2. Flex your affected ankle to tilt the top of your foot toward your shin. 3. Hold this position for 5 seconds. 4. Point your toes downward to tilt the top of your foot away from your shin. 5. Hold this position for 5 seconds. Repeat 2 times. Complete this exercise 3 times per week. Exercise B: Ankle Alphabet   1. Sit with your affected foot supported at your lower leg. ? Do not rest your foot on anything. ? Make sure your foot has room to move freely. 2. Think of your affected foot as a paintbrush, and move your foot to trace each letter of the alphabet in the air. Keep your hip and knee still while you trace. Make the letters as large as you can without increasing any discomfort. 3. Trace every letter from A to Z. Repeat 2 times. Complete this exercise 3 times per week. Strengthening exercises These exercises build strength and endurance in your ankle. Endurance is the ability to use your muscles for a long time, even after they get tired. Exercise D: Dorsiflexors   1. Secure a rubber exercise band or tube to an object, such as a table leg, that will stay still when the band is pulled. Secure the other end  around your affected foot. 2. Sit on the floor, facing the object with your affected leg extended. The band or tube should be slightly tense when your foot is relaxed. 3. Slowly flex your affected ankle and toes to bring your foot toward you. 4. Hold this position for 3 seconds.  5. Slowly return your foot to the starting position, controlling the band as you do that. Do a total of 10 repetitions. Repeat 2 times. Complete this exercise 3 times per week. Exercise E: Plantar Flexors   1. Sit on the floor with your affected leg extended. 2. Loop a rubber exercise band or tube around the ball of your affected foot. The ball of your foot is on the walking surface, right under your toes. The band or tube should be slightly tense when your foot is relaxed. 3. Slowly point your toes downward, pushing them away from you. 4. Hold this position for 3 seconds. 5. Slowly release the tension in the band or tube, controlling smoothly until your foot is back in the starting position. Repeat for a total of 10 repetitions. Repeat 2 times. Complete this exercise 3 times per week. Exercise F: Towel Curls   1. Sit in a chair on a non-carpeted surface, and put your feet on the floor. 2. Place a towel in front of your feet.  3. Keeping your heel on the floor, put your affected foot on the towel. 4. Pull  the towel toward you by grabbing the towel with your toes and curling them under. Keep your heel on the floor. 5. Let your toes relax. 6. Grab the towel again. Keep going until the towel is completely underneath your foot. Repeat for a total of 10 repetitions. Repeat 2 times. Complete this exercise 3 times per week. Exercise G: Heel Raise ( Plantar Flexors, Standing)    1. Stand with your feet shoulder-width apart. 2. Keep your weight spread evenly over the width of your feet while you rise up on your toes. Use a wall or table to steady yourself, but try not to use it for support. 3. If this exercise is  too easy, try these options: ? Shift your weight toward your affected leg until you feel challenged. ? If told by your health care provider, lift your uninjured leg off the floor. 4. Hold this position for 3 seconds. Repeat for a total of 10 repetitions. Repeat 2 times. Complete this exercise 3 times per week. Exercise H: Tandem Walking 1. Stand with one foot directly in front of the other. 2. Slowly raise your back foot up, lifting your heel before your toes, and place it directly in front of your other foot. 3. Continue to walk in this heel-to-toe way for 10 steps or for as long as told by your health care provider. Have a countertop or wall nearby to use if needed to keep your balance, but try not to hold onto anything for support. Repeat 2 times. Complete this exercises 3 times per week. Make sure you discuss any questions you have with your health care provider. Document Released: 05/17/2005 Document Revised: 03/02/2016 Document Reviewed: 03/21/2015 Elsevier Interactive Patient Education  2018 [employer].

## 2019-07-04 NOTE — Progress Notes (Signed)
Musculoskeletal Exam  Patient: Danielle Garcia DOB: 11-Mar-1991  DOS: 07/04/2019  SUBJECTIVE:  Chief Complaint:   Chief Complaint  Patient presents with  . Foot Pain    Danielle Garcia is a 28 y.o.  female for evaluation and treatment of R foot pain.   Onset:  1 month ago. No inj or change in activity.  Location: outer ankle circumferentially Character:  aching and sharp  Progression of issue:  has worsened Associated symptoms: sometimes gets stuck, pain with inversion; no bruising, redness or swelling Treatment: to date has been OTC NSAIDS.   Neurovascular symptoms: no  ROS: Musculoskeletal/Extremities: +foot pain  Past Medical History:  Diagnosis Date  . Anxiety   . Asthma   . Back pain   . Depression   . Fatigue   . Floaters   . GERD (gastroesophageal reflux disease)   . HTN (hypertension)   . Migraine   . Neck pain   . Pre-diabetes   . Shortness of breath   . Trouble in sleeping     Objective: VITAL SIGNS: BP 130/84 (BP Location: Left Arm, Patient Position: Sitting, Cuff Size: Large)   Pulse 91   Temp 97.6 F (36.4 C) (Oral)   Wt 256 lb (116.1 kg)   SpO2 94%   BMI 45.35 kg/m  Constitutional: Well formed, well developed. No acute distress. Cardiovascular: Brisk cap refill Thorax & Lungs: No accessory muscle use Musculoskeletal: R ankle.   Normal active range of motion: yes.   Normal passive range of motion: yes Tenderness to palpation: yes over extensor tendons at ankle; no other ttp, including prox fib Deformity: no Ecchymosis: no Tests positive: none Tests negative: squeeze, ant drawer Neurologic: Normal sensory function.  Psychiatric: Normal mood. Age appropriate judgment and insight. Alert & oriented x 3.    Assessment:  Right ankle tendonitis - Plan: meloxicam (MOBIC) 15 MG tablet  Plan: Orders as above. Tylenol, ice, heat, stretches/exercises. If no improvement over next several weeks, will consider CAM walker and PT. F/u prn. The  patient voiced understanding and agreement to the plan.   [place], DO 07/04/19  10:55 AM

## 2019-07-16 MED FILL — NORTRIPTYLINE HCL 10 MG CAP: 10 | 30 days supply | Qty: 30 | Fill #1

## 2019-08-04 ENCOUNTER — Ambulatory Visit (INDEPENDENT_AMBULATORY_CARE_PROVIDER_SITE_OTHER): Payer: 59 | Admitting: Family Medicine

## 2019-08-04 ENCOUNTER — Encounter: Payer: Self-pay | Admitting: Family Medicine

## 2019-08-04 ENCOUNTER — Other Ambulatory Visit: Payer: Self-pay

## 2019-08-04 ENCOUNTER — Ambulatory Visit: Payer: 59 | Admitting: Family Medicine

## 2019-08-04 ENCOUNTER — Telehealth: Payer: Self-pay | Admitting: Family Medicine

## 2019-08-04 VITALS — BP 120/84 | HR 83 | Temp 96.9°F | Ht 63.0 in | Wt 250.0 lb

## 2019-08-04 DIAGNOSIS — G43009 Migraine without aura, not intractable, without status migrainosus: Secondary | ICD-10-CM | POA: Diagnosis not present

## 2019-08-04 DIAGNOSIS — E559 Vitamin D deficiency, unspecified: Secondary | ICD-10-CM | POA: Diagnosis not present

## 2019-08-04 NOTE — Patient Instructions (Addendum)
Give [place] 2-3 business days to get the results of your labs back.   Keep the diet clean and stay active.  Goal weight: 240 lbs.  Let us know if you need anything.

## 2019-08-04 NOTE — Telephone Encounter (Signed)
Faxed [employer]ML[employer] to [phone] Received fax confirmation. Patient given copy of completed form Original sent to scan.

## 2019-08-04 NOTE — Progress Notes (Signed)
Chief Complaint  Patient presents with  . FMLA paperwork    Subjective: Patient is a 29 y.o. female here for f/u migraines.  Hx of migraines, currently well controlled. Has FMLA forms. 2 d/mo excusable, no issues and rarely needs this. No current headache. Compliant w meds, no AE's.    ROS: Heart: Denies chest pain  Lungs: Denies SOB   Past Medical History:  Diagnosis Date  . Anxiety   . Asthma   . Back pain   . Depression   . Fatigue   . Floaters   . GERD (gastroesophageal reflux disease)   . HTN (hypertension)   . Migraine   . Neck pain   . Pre-diabetes   . Shortness of breath   . Trouble in sleeping     Objective: BP 120/84 (BP Location: Left Arm, Patient Position: Sitting, Cuff Size: Large)   Pulse 83   Temp (!) 96.9 F (36.1 C) (Temporal)   Ht 5\' 3"  (1.6 m)   Wt 250 lb (113.4 kg)   SpO2 98%   BMI 44.29 kg/m  General: Awake, appears stated age HEENT: MMM, EOMi Heart: RRR, no LE edema Lungs: CTAB, no rales, wheezes or rhonchi. No accessory muscle use Psych: Age appropriate judgment and insight, normal affect and mood  Assessment and Plan: Migraine without aura and without status migrainosus, not intractable  Vitamin D deficiency - Plan: VITAMIN D 25 Hydroxy (Vit-D Deficiency, Fractures)  Morbid obesity (HCC)  Cont meds. Forms filled out. Ck Vit D, may need daily supp also. Goal weight by 7 mo is 240 lbs. F/u in 7 mo for CPE. The patient voiced understanding and agreement to the plan.  [place], DO 08/04/19  3:27 PM

## 2019-08-05 LAB — VITAMIN D 25 HYDROXY (VIT D DEFICIENCY, FRACTURES): VITD: 15.6 ng/mL — ABNORMAL LOW (ref 30.00–100.00)

## 2019-08-13 ENCOUNTER — Ambulatory Visit: Payer: 59 | Admitting: Psychology

## 2019-08-20 ENCOUNTER — Other Ambulatory Visit: Payer: Self-pay | Admitting: Family Medicine

## 2019-08-20 MED ORDER — PHENTERMINE HCL 37.5 MG PO CAPS: 37.5000 mg | ORAL_CAPSULE | ORAL | 0 refills | Status: DC

## 2019-08-20 MED FILL — PHENTERMINE 37.5 MG CAPSULE: 37.5 | 30 days supply | Qty: 30 | Fill #0

## 2019-09-01 ENCOUNTER — Ambulatory Visit: Payer: 59 | Admitting: Psychology

## 2019-09-04 ENCOUNTER — Ambulatory Visit (INDEPENDENT_AMBULATORY_CARE_PROVIDER_SITE_OTHER): Payer: 59 | Admitting: Psychology

## 2019-09-04 DIAGNOSIS — F4323 Adjustment disorder with mixed anxiety and depressed mood: Secondary | ICD-10-CM | POA: Diagnosis not present

## 2019-09-24 ENCOUNTER — Ambulatory Visit (INDEPENDENT_AMBULATORY_CARE_PROVIDER_SITE_OTHER): Payer: 59 | Admitting: Psychology

## 2019-09-24 DIAGNOSIS — F4323 Adjustment disorder with mixed anxiety and depressed mood: Secondary | ICD-10-CM | POA: Diagnosis not present

## 2019-09-30 ENCOUNTER — Other Ambulatory Visit: Payer: Self-pay

## 2019-10-01 ENCOUNTER — Encounter: Payer: Self-pay | Admitting: Family Medicine

## 2019-10-01 ENCOUNTER — Ambulatory Visit: Payer: 59 | Admitting: Family Medicine

## 2019-10-01 VITALS — BP 122/82 | HR 86 | Temp 95.9°F | Ht 63.0 in | Wt 255.1 lb

## 2019-10-01 DIAGNOSIS — K219 Gastro-esophageal reflux disease without esophagitis: Secondary | ICD-10-CM | POA: Diagnosis not present

## 2019-10-01 DIAGNOSIS — E559 Vitamin D deficiency, unspecified: Secondary | ICD-10-CM

## 2019-10-01 DIAGNOSIS — R3589 Other polyuria: Secondary | ICD-10-CM

## 2019-10-01 DIAGNOSIS — R5383 Other fatigue: Secondary | ICD-10-CM

## 2019-10-01 DIAGNOSIS — R358 Other polyuria: Secondary | ICD-10-CM

## 2019-10-01 LAB — POC URINALSYSI DIPSTICK (AUTOMATED)
Bilirubin, UA: NEGATIVE
Blood, UA: NEGATIVE
Glucose, UA: NEGATIVE
Ketones, UA: NEGATIVE
Leukocytes, UA: NEGATIVE
Nitrite, UA: NEGATIVE
Protein, UA: POSITIVE — AB
Spec Grav, UA: >=1.03 — AB (ref 1.010–1.025)
Urobilinogen, UA: NEGATIVE E.U./dL — AB
pH, UA: 6 (ref 5.0–8.0)

## 2019-10-01 LAB — COMPREHENSIVE METABOLIC PANEL
ALT: 12 U/L (ref 0–35)
AST: 16 U/L (ref 0–37)
Albumin: 3.9 g/dL (ref 3.5–5.2)
Alkaline Phosphatase: 47 U/L (ref 39–117)
BUN: 17 mg/dL (ref 6–23)
CO2: 27 mEq/L (ref 19–32)
Calcium: 9 mg/dL (ref 8.4–10.5)
Chloride: 105 mEq/L (ref 96–112)
Creatinine, Ser: 0.73 mg/dL (ref 0.40–1.20)
GFR: 113.59 mL/min (ref >60.00)
Glucose, Bld: 94 mg/dL (ref 70–99)
Potassium: 3.8 mEq/L (ref 3.5–5.1)
Sodium: 137 mEq/L (ref 135–145)
Total Bilirubin: 0.5 mg/dL (ref 0.2–1.2)
Total Protein: 6.6 g/dL (ref 6.0–8.3)

## 2019-10-01 LAB — CBC
HCT: 39.8 % (ref 36.0–46.0)
Hemoglobin: 13.4 g/dL (ref 12.0–15.0)
MCHC: 33.8 g/dL (ref 30.0–36.0)
MCV: 91.6 fl (ref 78.0–100.0)
Platelets: 262 10*3/uL (ref 150.0–400.0)
RBC: 4.34 Mil/uL (ref 3.87–5.11)
RDW: 13 % (ref 11.5–15.5)
WBC: 3.4 10*3/uL — ABNORMAL LOW (ref 4.0–10.5)

## 2019-10-01 LAB — TSH: TSH: 0.94 u[IU]/mL (ref 0.35–4.50)

## 2019-10-01 LAB — HEMOGLOBIN A1C: Hgb A1c MFr Bld: 5.6 % (ref 4.6–6.5)

## 2019-10-01 MED ORDER — CYCLOBENZAPRINE HCL 10 MG PO TABS: 5.0000 mg | ORAL_TABLET | Freq: Three times a day (TID) | ORAL | 0 refills | Status: DC | PRN

## 2019-10-01 MED ORDER — LEVOCETIRIZINE DIHYDROCHLORIDE 5 MG PO TABS: 5.0000 mg | ORAL_TABLET | Freq: Every evening | ORAL | 2 refills | Status: DC

## 2019-10-01 MED ORDER — PANTOPRAZOLE SODIUM 40 MG PO TBEC: 40.0000 mg | DELAYED_RELEASE_TABLET | Freq: Every day | ORAL | 3 refills | Status: DC

## 2019-10-01 MED FILL — PANTOPRAZOLE SOD DR 40 MG T: 40 | 90 days supply | Qty: 90 | Fill #0

## 2019-10-01 MED FILL — CYCLOBENZAPRINE HCL 10 MG T: 10 | 7 days supply | Qty: 21 | Fill #0

## 2019-10-01 MED FILL — LEVOCETIRIZINE 5 MG TABLET: 5 | 90 days supply | Qty: 90 | Fill #0

## 2019-10-01 NOTE — Patient Instructions (Signed)
Give us 2-3 business days to get the results of your labs back.   Keep the diet clean and stay active.  Let us know if you need anything. 

## 2019-10-01 NOTE — Progress Notes (Signed)
Chief Complaint  Patient presents with  . Fatigue  . Back Pain  . Allergies    Subjective: Patient is a 29 y.o. female here for fatigue.  Has been having fatigue over the past 2-3 months.  She has had a sleep study in the past but her husband noticed that she appears to stop breathing at night.  No unexplained weight changes.  Diet and exercise have been largely unchanged and she is intentionally trying to lose weight.  No medication changes.  Of note, while she is not drinking more water, she is urinating frequently.  When she does urinate, it does seem like a decent amount.  Denies any pain or bleeding.  She does have a history of prediabetes.  Mood has been largely unchanged.  Patient has been having back spasms and requesting a refill of Flexeril as this does help.  Past Medical History:  Diagnosis Date  . Anxiety   . Asthma   . Back pain   . Depression   . Fatigue   . Floaters   . GERD (gastroesophageal reflux disease)   . HTN (hypertension)   . Migraine   . Neck pain   . Pre-diabetes   . Shortness of breath   . Trouble in sleeping     Objective: BP 122/82 (BP Location: Left Arm, Patient Position: Sitting, Cuff Size: Large)   Pulse 86   Temp (!) 95.9 F (35.5 C) (Temporal)   Ht 5\' 3"  (1.6 m)   Wt 255 lb 2 oz (115.7 kg)   SpO2 95%   BMI 45.19 kg/m  General: Awake, appears stated age HEENT: MMM, EOMi Heart: RRR, no lower extremity edema Lungs: CTAB, no rales, wheezes or rhonchi. No accessory muscle use Psych: Age appropriate judgment and insight, normal affect and mood  Assessment and Plan: Fatigue, unspecified type - Plan: TSH, CBC, Comprehensive metabolic panel  Polyuria - Plan: Hemoglobin A1c, Urinalysis, POCT Urinalysis Dipstick (Automated)  Vitamin D deficiency  Gastroesophageal reflux disease, unspecified whether esophagitis present  We will check above labs.  Concern for diabetes.  If the above is unremarkable, will have her reach out to her sleep  specialist for possible repeat testing given this change since her last sleep study. The patient voiced understanding and agreement to the plan.  [place], DO 10/01/19  11:51 AM

## 2019-10-02 NOTE — Addendum Note (Signed)
Addended by: Caffie Pinto on: 10/02/2019 08:35 AM   Modules accepted: Orders

## 2019-10-03 ENCOUNTER — Emergency Department (HOSPITAL_BASED_OUTPATIENT_CLINIC_OR_DEPARTMENT_OTHER)
Admission: EM | Admit: 2019-10-03 | Discharge: 2019-10-03 | Disposition: A | Discharge status: Home / Self Care | Payer: 59 | Attending: Emergency Medicine | Admitting: Emergency Medicine

## 2019-10-03 ENCOUNTER — Other Ambulatory Visit: Payer: Self-pay

## 2019-10-03 ENCOUNTER — Emergency Department (HOSPITAL_BASED_OUTPATIENT_CLINIC_OR_DEPARTMENT_OTHER): Payer: 59

## 2019-10-03 ENCOUNTER — Encounter (HOSPITAL_BASED_OUTPATIENT_CLINIC_OR_DEPARTMENT_OTHER): Payer: Self-pay | Admitting: Emergency Medicine

## 2019-10-03 DIAGNOSIS — J988 Other specified respiratory disorders: Secondary | ICD-10-CM | POA: Diagnosis not present

## 2019-10-03 DIAGNOSIS — U071 COVID-19: Secondary | ICD-10-CM | POA: Insufficient documentation

## 2019-10-03 DIAGNOSIS — R0789 Other chest pain: Secondary | ICD-10-CM | POA: Diagnosis present

## 2019-10-03 DIAGNOSIS — I10 Essential (primary) hypertension: Secondary | ICD-10-CM | POA: Diagnosis not present

## 2019-10-03 DIAGNOSIS — J45909 Unspecified asthma, uncomplicated: Secondary | ICD-10-CM | POA: Insufficient documentation

## 2019-10-03 DIAGNOSIS — Z79899 Other long term (current) drug therapy: Secondary | ICD-10-CM | POA: Diagnosis not present

## 2019-10-03 DIAGNOSIS — J069 Acute upper respiratory infection, unspecified: Secondary | ICD-10-CM

## 2019-10-03 DIAGNOSIS — R079 Chest pain, unspecified: Secondary | ICD-10-CM | POA: Diagnosis not present

## 2019-10-03 DIAGNOSIS — R0602 Shortness of breath: Secondary | ICD-10-CM | POA: Diagnosis not present

## 2019-10-03 MED ORDER — ALBUTEROL SULFATE HFA 108 (90 BASE) MCG/ACT IN AERS
2.0000 | INHALATION_SPRAY | RESPIRATORY_TRACT | Status: DC | PRN
  Administered 2019-10-03: 2 via RESPIRATORY_TRACT
  Filled 2019-10-03: qty 6.7

## 2019-10-03 NOTE — ED Provider Notes (Signed)
[facility] Provider Note: [name], MD, FACEP  CSN: [id number] MRN: [id number] ARRIVAL: 10/03/19 at [place]: [place]  Chest Pain   HISTORY OF PRESENT ILLNESS  10/03/19 1:09 AM Danielle Garcia is a 29 y.o. female with a 1 day history of fever, body aches, chest pressure (like something is sitting on her chest making deep breathing difficult) and nasal congestion.  She tested positive for Covid yesterday.  She rates her discomfort as a 6 out of 10 but she has been successfully relieving her fever and body aches with ibuprofen.  She denies cough, sore throat, loss of taste or smell, nausea, vomiting or diarrhea.   Past Medical History:  Diagnosis Date  . Anxiety   . Asthma   . Back pain   . Depression   . Fatigue   . Floaters   . GERD (gastroesophageal reflux disease)   . HTN (hypertension)   . Migraine   . Neck pain   . Pre-diabetes   . Shortness of breath   . Trouble in sleeping     Past Surgical History:  Procedure Laterality Date  . CESAREAN SECTION    . DILATION AND EVACUATION N/A 07/02/2018   Procedure: DILATATION AND EVACUATION;  Surgeon: Aletha Halim, MD;  Location: [facility];  Service: Gynecology;  Laterality: N/A;  . WISDOM TOOTH EXTRACTION      Family History  Problem Relation Age of Onset  . Hypertension Mother   . Heart attack Father 77  . Obesity Father   . Hypertension Maternal Grandmother   . Diabetes Maternal Grandmother   . Lung cancer Maternal Grandfather   . Diabetes Paternal Grandmother   . Allergies Daughter     Social History   Tobacco Use  . Smoking status: Never Smoker  . Smokeless tobacco: Never Used  Substance Use Topics  . Alcohol use: Yes    Comment: occasionally  . Drug use: No    Prior to Admission medications   Medication Sig Start Date End Date Taking? Authorizing Provider  cyclobenzaprine (FLEXERIL) 10 MG tablet Take 0.5-1 tablets (5-10 mg total) by mouth 3 (three) times daily  as needed for muscle spasms. 10/01/19   [name], DO  ergocalciferol (VITAMIN D2) 1.25 MG (50000 UT) capsule Take 1 capsule (50,000 Units total) by mouth once a week. 02/07/19   [name], DO  levocetirizine (XYZAL) 5 MG tablet Take 1 tablet (5 mg total) by mouth every evening. 10/01/19   [name], DO  lidocaine (XYLOCAINE) 2 % jelly APPLY 1 APPLICATION TOPICALLY AS NEEDED. 06/04/19   Dove, Myra C, MD  mometasone (NASONEX) 50 MCG/ACT nasal spray Place 2 sprays into the nose daily. 01/08/19   [name], DO  nortriptyline (PAMELOR) 10 MG capsule Take 1 capsule (10 mg total) by mouth at bedtime. 01/13/19   Tomi Likens, Adam R, DO  pantoprazole (PROTONIX) 40 MG tablet Take 1 tablet (40 mg total) by mouth daily. 10/01/19   [name], DO  valACYclovir (VALTREX) 1000 MG tablet Take 1 tab daily for 5 days during outbreaks. 08/05/18   [name], DO    Allergies Patient has no known allergies.   REVIEW OF SYSTEMS  Negative except as noted here or in the History of Present Illness.   PHYSICAL EXAMINATION  Initial Vital Signs Blood pressure 131/87, pulse 65, temperature 98.5 F (36.9 C), resp. rate 20, height 5\' 3"  (1.6 m), weight 115.7 kg, SpO2 99 %.  Examination General: Well-developed, well-nourished female in no acute distress; appearance consistent with age of record HENT: normocephalic; atraumatic Eyes: pupils equal, round and reactive to light; extraocular muscles intact Neck: supple Heart: regular rate and rhythm Lungs: Mildly increased work of breathing with decreased air movement Abdomen: soft; nondistended; nontender; bowel sounds present Extremities: No deformity; full range of motion; pulses normal Neurologic: Awake, alert and oriented; motor function intact in all extremities and symmetric; no facial droop Skin: Warm and dry Psychiatric: Normal mood and affect   RESULTS  Summary of this visit's results,  reviewed and interpreted by myself:   EKG Interpretation  Date/Time:  Friday October 03 2019 00:44:27 EDT Ventricular Rate:  65 PR Interval:    QRS Duration: 81 QT Interval:  405 QTC Calculation: 422 R Axis:   88 Text Interpretation: Sinus arrhythmia Low voltage, precordial leads No previous ECGs available Confirmed by Arthella Headings [phone]) on 10/03/2019 1:09:24 AM      Laboratory Studies: No results found for this or any previous visit (from the past 24 hour(s)). Imaging Studies: DG Chest Port 1 View  Result Date: 10/03/2019 CLINICAL DATA:  Shortness of breath.  COVID-19 positive. EXAM: PORTABLE CHEST 1 VIEW COMPARISON:  06/12/2017 FINDINGS: The heart size and mediastinal contours are within normal limits. Both lungs are clear. The visualized skeletal structures are unremarkable. IMPRESSION: No active disease. Electronically Signed   By: Ulyses Jarred M.D.   On: 10/03/2019 01:52    ED COURSE and MDM  Nursing notes, initial and subsequent vitals signs, including pulse oximetry, reviewed and interpreted by myself.  Vitals:   10/03/19 0041 10/03/19 0045 10/03/19 0130  BP:  131/87   Pulse:  65   Resp:  20   Temp:  98.5 F (36.9 C)   SpO2:  99% 100%  Weight: 115.7 kg    Height: 5\' 3"  (1.6 m)     Medications  albuterol (VENTOLIN HFA) 108 (90 Base) MCG/ACT inhaler 2 puff (2 puffs Inhalation Given 10/03/19 0128)   1:59 AM Air movement improved, patient feels better after albuterol.  Patient given inhaler and AeroChamber and instructed in their use.  No evidence of pneumonia on chest x-ray.   PROCEDURES  Procedures   ED DIAGNOSES     ICD-10-CM   1. Acute respiratory disease due to COVID-19 virus  U07.1    J06.9        Lorenza Winkleman, MD 10/03/19 0200

## 2019-10-03 NOTE — ED Triage Notes (Signed)
Pt arrives with chest pain, told she has COVID 19 today by [employer]. Stuffy nose since Monday.

## 2019-10-07 ENCOUNTER — Other Ambulatory Visit: Payer: Self-pay | Admitting: Family Medicine

## 2019-10-07 DIAGNOSIS — J45909 Unspecified asthma, uncomplicated: Secondary | ICD-10-CM | POA: Insufficient documentation

## 2019-10-07 MED ORDER — PREDNISONE 20 MG PO TABS: 40.0000 mg | ORAL_TABLET | Freq: Every day | ORAL | 0 refills | Status: AC

## 2019-10-07 MED FILL — predniSONE 20 MG TABS: 20 | 5 days supply | Qty: 10 | Fill #0

## 2019-10-08 ENCOUNTER — Ambulatory Visit (HOSPITAL_COMMUNITY)
Admission: RE | Admit: 2019-10-08 | Discharge: 2019-10-08 | Disposition: A | Discharge status: Home / Self Care | Payer: 59 | Source: Ambulatory Visit | Attending: Pulmonary Disease | Admitting: Pulmonary Disease

## 2019-10-08 ENCOUNTER — Ambulatory Visit: Payer: 59

## 2019-10-08 ENCOUNTER — Other Ambulatory Visit: Payer: Self-pay | Admitting: Nurse Practitioner

## 2019-10-08 DIAGNOSIS — U071 COVID-19: Secondary | ICD-10-CM

## 2019-10-08 MED ORDER — SODIUM CHLORIDE 0.9 % IV SOLN
700.0000 mg | Freq: Once | INTRAVENOUS | Status: DC
  Filled 2019-10-08: qty 20

## 2019-10-08 NOTE — Progress Notes (Signed)
  I connected by phone with Danielle Garcia on 10/08/2019 at 8:25 AM to discuss the potential use of an new treatment for mild to moderate COVID-19 viral infection in non-hospitalized patients.  This patient is a 29 y.o. female that meets the FDA criteria for Emergency Use Authorization of bamlanivimab or casirivimab\imdevimab.  Has a (+) direct SARS-CoV-2 viral test result  Has mild or moderate COVID-19   Is ? 30 years of age and weighs ? 40 kg  Is NOT hospitalized due to COVID-19  Is NOT requiring oxygen therapy or requiring an increase in baseline oxygen flow rate due to COVID-19  Is within 10 days of symptom onset  Has at least one of the high risk factor(s) for progression to severe COVID-19 and/or hospitalization as defined in EUA.  Specific high risk criteria : BMI >/= 35   I have spoken and communicated the following to the patient or parent/caregiver:  1. FDA has authorized the emergency use of bamlanivimab and casirivimab\imdevimab for the treatment of mild to moderate COVID-19 in adults and pediatric patients with positive results of direct SARS-CoV-2 viral testing who are 58 years of age and older weighing at least 40 kg, and who are at high risk for progressing to severe COVID-19 and/or hospitalization.  2. The significant known and potential risks and benefits of bamlanivimab and casirivimab\imdevimab, and the extent to which such potential risks and benefits are unknown.  3. Information on available alternative treatments and the risks and benefits of those alternatives, including clinical trials.  4. Patients treated with bamlanivimab and casirivimab\imdevimab should continue to self-isolate and use infection control measures (e.g., wear mask, isolate, social distance, avoid sharing personal items, clean and disinfect "high touch" surfaces, and frequent handwashing) according to CDC guidelines.   5. The patient or parent/caregiver has the option to accept or refuse  bamlanivimab or casirivimab\imdevimab .  After reviewing this information with the patient, The patient agreed to proceed with receiving the bamlanimivab infusion and will be provided a copy of the Fact sheet prior to receiving the infusion.Fenton Foy 10/08/2019 8:25 AM

## 2019-10-09 ENCOUNTER — Ambulatory Visit: Payer: 59

## 2019-10-14 ENCOUNTER — Encounter: Payer: Self-pay | Admitting: Family Medicine

## 2019-10-14 ENCOUNTER — Ambulatory Visit (INDEPENDENT_AMBULATORY_CARE_PROVIDER_SITE_OTHER): Payer: 59 | Admitting: Family Medicine

## 2019-10-14 ENCOUNTER — Other Ambulatory Visit: Payer: Self-pay

## 2019-10-14 VITALS — BP 120/78 | HR 85 | Temp 97.2°F | Ht 63.0 in | Wt 255.0 lb

## 2019-10-14 DIAGNOSIS — R0789 Other chest pain: Secondary | ICD-10-CM | POA: Diagnosis not present

## 2019-10-14 DIAGNOSIS — N926 Irregular menstruation, unspecified: Secondary | ICD-10-CM

## 2019-10-14 DIAGNOSIS — R002 Palpitations: Secondary | ICD-10-CM | POA: Diagnosis not present

## 2019-10-14 LAB — CBC
HCT: 37.8 % (ref 36.0–46.0)
Hemoglobin: 12.7 g/dL (ref 12.0–15.0)
MCHC: 33.5 g/dL (ref 30.0–36.0)
MCV: 90.4 fl (ref 78.0–100.0)
Platelets: 345 10*3/uL (ref 150.0–400.0)
RBC: 4.18 Mil/uL (ref 3.87–5.11)
RDW: 12.7 % (ref 11.5–15.5)
WBC: 4.6 10*3/uL (ref 4.0–10.5)

## 2019-10-14 LAB — COMPREHENSIVE METABOLIC PANEL
ALT: 16 U/L (ref 0–35)
AST: 15 U/L (ref 0–37)
Albumin: 3.7 g/dL (ref 3.5–5.2)
Alkaline Phosphatase: 45 U/L (ref 39–117)
BUN: 16 mg/dL (ref 6–23)
CO2: 28 mEq/L (ref 19–32)
Calcium: 8.6 mg/dL (ref 8.4–10.5)
Chloride: 107 mEq/L (ref 96–112)
Creatinine, Ser: 0.69 mg/dL (ref 0.40–1.20)
GFR: 121.19 mL/min (ref >60.00)
Glucose, Bld: 119 mg/dL — ABNORMAL HIGH (ref 70–99)
Potassium: 3.6 mEq/L (ref 3.5–5.1)
Sodium: 137 mEq/L (ref 135–145)
Total Bilirubin: 0.6 mg/dL (ref 0.2–1.2)
Total Protein: 6 g/dL (ref 6.0–8.3)

## 2019-10-14 LAB — MAGNESIUM: Magnesium: 1.8 mg/dL (ref 1.5–2.5)

## 2019-10-14 LAB — HCG, SERUM, QUALITATIVE: Preg, Serum: NEGATIVE

## 2019-10-14 LAB — TSH: TSH: 1.38 u[IU]/mL (ref 0.35–4.50)

## 2019-10-14 NOTE — Patient Instructions (Signed)
Drink Powerade (around 17-32 oz) daily with water, around a total of 70-80 oz total fluid per day.  Give [place] 2-3 business days to get the results of your labs back.   Keep the diet clean and stay active.  Let us know if you need anything.

## 2019-10-14 NOTE — Progress Notes (Signed)
Chief Complaint  Patient presents with  . Follow-up    Covid symptoms    Danielle Garcia is a 29 y.o. female here for evaluation of chest pressure.  Duration of issue: 1 day Quality: pressure, palpitations Palliation: time Provocation: physical activity while at work- did not get while walking Severity: 7/10 Radiation: none Duration of chest pain: 10-15 min after it starts Associated symptoms: sometimes feels off kilter Cardiac history: none Family heart history: father w CAD Smoker? No  Does not drink caffeine. Stays hydrated with water.   Past Medical History:  Diagnosis Date  . Anxiety   . Asthma   . Back pain   . Depression   . Fatigue   . Floaters   . GERD (gastroesophageal reflux disease)   . HTN (hypertension)   . Migraine   . Neck pain   . Pre-diabetes   . Shortness of breath   . Trouble in sleeping    Family History  Problem Relation Age of Onset  . Hypertension Mother   . Heart attack Father 40  . Obesity Father   . Hypertension Maternal Grandmother   . Diabetes Maternal Grandmother   . Lung cancer Maternal Grandfather   . Diabetes Paternal Grandmother   . Allergies Daughter    BP 120/78 (BP Location: Left Arm, Patient Position: Sitting, Cuff Size: Large)   Pulse 85   Temp (!) 97.2 F (36.2 C) (Temporal)   Ht 5\' 3"  (1.6 m)   Wt 255 lb (115.7 kg)   LMP  (LMP Unknown)   SpO2 97%   BMI 45.17 kg/m  Gen: awake, alert, appears stated age HEENT: PERRLA, MMM Neck: No masses or asymmetry Heart: RRR, no bruits, no LE edema Lungs: CTAB, no accessory muscle use Psych: Age appropriate judgment and insight, nml mood and affect  Chest pressure - Plan: EKG 12-Lead  Missed period - Plan: hCG, serum, qualitative  Palpitations - Plan: CBC, Comprehensive metabolic panel, TSH, Magnesium, EKG 12-Lead  EKG NSR w rhythmic pause suggesting sinus arrhythmia, nml axis, no interval abn, no signs of ischemia.  Stay hydrated. Consider sports drinks with  electrolytes. Ck labs. If hydration does not work, labs are normal and still having bothersome s/s's, will refer to cardiology. F/u prn. The patient voiced understanding and agreement to the plan.  [place], DO 10/14/19 7:34 AM

## 2019-10-15 ENCOUNTER — Other Ambulatory Visit (INDEPENDENT_AMBULATORY_CARE_PROVIDER_SITE_OTHER): Payer: 59

## 2019-10-15 DIAGNOSIS — R739 Hyperglycemia, unspecified: Secondary | ICD-10-CM

## 2019-10-15 LAB — HEMOGLOBIN A1C: Hgb A1c MFr Bld: 5.7 % (ref 4.6–6.5)

## 2019-10-15 NOTE — Addendum Note (Signed)
Addended by: Trenda Moots on: [id number] 09:11 AM   Modules accepted: Orders

## 2019-10-27 ENCOUNTER — Ambulatory Visit (INDEPENDENT_AMBULATORY_CARE_PROVIDER_SITE_OTHER): Payer: 59 | Admitting: Psychology

## 2019-10-27 DIAGNOSIS — F4323 Adjustment disorder with mixed anxiety and depressed mood: Secondary | ICD-10-CM | POA: Diagnosis not present

## 2019-11-10 ENCOUNTER — Encounter: Payer: Self-pay | Admitting: Family Medicine

## 2019-11-10 ENCOUNTER — Ambulatory Visit (INDEPENDENT_AMBULATORY_CARE_PROVIDER_SITE_OTHER): Payer: 59 | Admitting: Family Medicine

## 2019-11-10 ENCOUNTER — Other Ambulatory Visit: Payer: Self-pay

## 2019-11-10 VITALS — BP 122/82 | HR 76 | Temp 95.7°F | Ht 63.0 in | Wt 257.0 lb

## 2019-11-10 DIAGNOSIS — R0789 Other chest pain: Secondary | ICD-10-CM

## 2019-11-10 DIAGNOSIS — M79661 Pain in right lower leg: Secondary | ICD-10-CM | POA: Diagnosis not present

## 2019-11-10 DIAGNOSIS — M25571 Pain in right ankle and joints of right foot: Secondary | ICD-10-CM

## 2019-11-10 MED ORDER — MELOXICAM 15 MG PO TABS: 15.0000 mg | ORAL_TABLET | Freq: Every day | ORAL | 0 refills | Status: DC

## 2019-11-10 MED FILL — MELOXICAM 15 MG TABLET: 15 | 30 days supply | Qty: 30 | Fill #0

## 2019-11-10 NOTE — Patient Instructions (Addendum)
Ice/cold pack over area for 10-15 min twice daily.  OK to take Tylenol 1000 mg (2 extra strength tabs) or 975 mg (3 regular strength tabs) every 6 hours as needed.  Heat (pad or rice pillow in microwave) over affected area, 10-15 minutes twice daily.   Chest pain that is exertional/related to physical activity is concerning.  Also if you are having shortness of breath, wheezing, or coughing, that would change our plan as well.  Swelling on the right side or worsening/more central right calf pain would be of concern.  Stay active.  Let us know if you need anything.

## 2019-11-10 NOTE — Progress Notes (Signed)
Chief Complaint  Patient presents with  . Leg Pain    chest discomfort    Danielle Garcia is a 29 y.o. female here for evaluation of left sided chest pain.  Duration of issue: 2 days Quality: dull Palliation: none Provocation: Taking a deep breath makes it worse Severity: 7/10 Radiation: L arm Duration of chest pain: has been constant Associated symptoms: None Cardiac history: none Smoker? No  Tried various GERD meds without relief.   R calf/ankle pain on outside for 2 d. No inj or change in activity. No swelling, redness, or bruising.  She does have a history of a provoked DVT after her pregnancy.  She denies any recent travel, prolonged bedrest, surgical procedures, or injury to her lower extremity.  Past Medical History:  Diagnosis Date  . Anxiety   . Asthma   . Depression   . Floaters   . GERD (gastroesophageal reflux disease)   . HTN (hypertension)   . Migraine   . Pre-diabetes    Family History  Problem Relation Age of Onset  . Hypertension Mother   . Heart attack Father 37  . Obesity Father   . Hypertension Maternal Grandmother   . Diabetes Maternal Grandmother   . Lung cancer Maternal Grandfather   . Diabetes Paternal Grandmother   . Allergies Daughter     Allergies as of 11/10/2019   No Known Allergies     Medication List       Accurate as of November 10, 2019  7:00 PM. If you have any questions, ask your nurse or doctor.        cyclobenzaprine 10 MG tablet Commonly known as: FLEXERIL Take 0.5-1 tablets (5-10 mg total) by mouth 3 (three) times daily as needed for muscle spasms.   ergocalciferol 1.25 MG (50000 UT) capsule Commonly known as: VITAMIN D2 Take 1 capsule (50,000 Units total) by mouth once a week.   levocetirizine 5 MG tablet Commonly known as: XYZAL Take 1 tablet (5 mg total) by mouth every evening.   lidocaine 2 % jelly Commonly known as: XYLOCAINE APPLY 1 APPLICATION TOPICALLY AS NEEDED.   meloxicam 15 MG tablet Commonly  known as: MOBIC Take 1 tablet (15 mg total) by mouth daily. Started by: Crosby Oyster Myeshia Fojtik, DO   mometasone 50 MCG/ACT nasal spray Commonly known as: NASONEX Place 2 sprays into the nose daily.   nortriptyline 10 MG capsule Commonly known as: PAMELOR Take 1 capsule (10 mg total) by mouth at bedtime.   pantoprazole 40 MG tablet Commonly known as: PROTONIX Take 1 tablet (40 mg total) by mouth daily.   valACYclovir 1000 MG tablet Commonly known as: VALTREX Take 1 tab daily for 5 days during outbreaks.       BP 122/82 (BP Location: Left Arm, Patient Position: Sitting, Cuff Size: Large)   Pulse 76   Temp (!) 95.7 F (35.4 C) (Temporal)   Ht 5\' 3"  (1.6 m)   Wt 257 lb (116.6 kg)   SpO2 96%   BMI 45.53 kg/m  Gen: awake, alert, appears stated age HEENT: PERRLA, MMM Neck: No masses or asymmetry Heart: RRR, no bruits, no LE edema Lungs: CTAB, no accessory muscle use Abd: Soft, NT, ND, no masses or organomegaly MSK: chest pain is reproducible to palptation on the left side of her chest; there is mild lateral right calf tenderness to palpation and tenderness over the anterior lateral ankle on the right Psych: Age appropriate judgment and insight, nml mood and affect  Chest wall  pain  Right calf pain  Acute right ankle pain  Meloxicam as needed for pain.  Tylenol, ice, heat.  Stretches and exercises given for each of the above muscles/joints.  Warning signs and symptoms verbalized.  She is at very low risk for DVT.  Very low risk for cardiogenic or pulmonic chest pain. The patient voiced understanding and agreement to the plan.  [place], DO 11/10/19 7:00 PM

## 2019-11-11 ENCOUNTER — Ambulatory Visit: Payer: 59 | Admitting: Family Medicine

## 2019-11-13 ENCOUNTER — Other Ambulatory Visit: Payer: Self-pay

## 2019-11-13 ENCOUNTER — Ambulatory Visit: Payer: 59

## 2019-11-13 ENCOUNTER — Ambulatory Visit (INDEPENDENT_AMBULATORY_CARE_PROVIDER_SITE_OTHER): Payer: 59 | Admitting: Family Medicine

## 2019-11-13 ENCOUNTER — Encounter: Payer: Self-pay | Admitting: Family Medicine

## 2019-11-13 VITALS — BP 135/88 | HR 78 | Temp 98.0°F | Resp 18 | Ht 63.0 in | Wt 257.0 lb

## 2019-11-13 DIAGNOSIS — G43009 Migraine without aura, not intractable, without status migrainosus: Secondary | ICD-10-CM | POA: Diagnosis not present

## 2019-11-13 MED ORDER — KETOROLAC TROMETHAMINE 60 MG/2ML IM SOLN
60.0000 mg | Freq: Once | INTRAMUSCULAR | Status: AC
  Administered 2019-11-13: 17:00:00 60 mg via INTRAMUSCULAR

## 2019-11-13 NOTE — Patient Instructions (Signed)
Good to see you today -you got a shot of Toradol for migraine headache. Please let me know if your headache does not resolve as it typically does. You can contact us at the office, or go to the ER if you have " the worst headache of your life"

## 2019-11-13 NOTE — Progress Notes (Signed)
[facility] [address], [place], [place] [address] [phone]  Date:  11/13/2019   Name:  Danielle Garcia   DOB:  [birth year]   MRN:  [id number]  PCP:  [name], DO    Chief Complaint: Headache   History of Present Illness:  Danielle Garcia is a 29 y.o. very pleasant female patient who presents with the following:  Pt with history of migraine headache, HTN, anxiety Here today with concern of migraine headache  She gets periodic migraines, typically a few year. She does not get aura Not worst headache of life  She may get a toradol shot prn for migraine- look like last given about a year ago, 60 mg   Normal renal function  Today she notes a migraine that came on this am.  Has felt a bit nauseated. This seems like a typical migraine to her No vomiting No fever or chills She often gets relief with toradol She did take tylenol about 2 hours ago but no NSAIDs today  No chance of current pregnancy- LMP was one week ago No injury or fall to trigger headache  BP Readings from Last 3 Encounters:  11/13/19 135/88  11/10/19 122/82  10/14/19 120/78     Patient Active Problem List   Diagnosis Date Noted  . Vitamin D deficiency 08/04/2019  . Morbid obesity ([place]) 02/07/2019  . Gastroesophageal reflux disease 11/27/2018  . Back pain 09/25/2018  . Urinary frequency 09/25/2018  . Other fatigue 02/05/2018  . Shortness of breath on exertion 02/05/2018  . Migraine 02/05/2018  . Essential hypertension 02/05/2018  . Anxiety and depression 11/26/2017  . 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 Anxiety   . Asthma   . Depression   . Floaters   . GERD (gastroesophageal reflux disease)   . HTN (hypertension)   . Migraine   . Pre-diabetes     Past Surgical History:  Procedure Laterality Date  . CESAREAN SECTION    . DILATION AND EVACUATION  N/A 07/02/2018   Procedure: DILATATION AND EVACUATION;  Surgeon: Aletha Halim, MD;  Location: [facility];  Service: Gynecology;  Laterality: N/A;  . WISDOM TOOTH EXTRACTION      Social History   Tobacco Use  . Smoking status: Never Smoker  . Smokeless tobacco: Never Used  Substance Use Topics  . Alcohol use: Yes    Comment: occasionally  . Drug use: No    Family History  Problem Relation Age of Onset  . Hypertension Mother   . Heart attack Father 42  . Obesity Father   . Hypertension Maternal Grandmother   . Diabetes Maternal Grandmother   . Lung cancer Maternal Grandfather   . Diabetes Paternal Grandmother   . Allergies Daughter     No Known Allergies  Medication list has been reviewed and updated.  Current Outpatient Medications on File Prior to Visit  Medication Sig Dispense Refill  . cyclobenzaprine (FLEXERIL) 10 MG tablet Take 0.5-1 tablets (5-10 mg total) by mouth 3 (three) times daily as needed for muscle spasms. 21 tablet 0  . ergocalciferol (VITAMIN D2) 1.25 MG (50000 UT) capsule Take 1 capsule (50,000 Units total) by mouth once a week. 12 capsule 3  . levocetirizine (XYZAL) 5 MG tablet Take 1 tablet (5 mg total) by mouth every evening. 90 tablet 2  . lidocaine (XYLOCAINE) 2 % jelly APPLY 1  APPLICATION TOPICALLY AS NEEDED. 30 mL 2  . meloxicam (MOBIC) 15 MG tablet Take 1 tablet (15 mg total) by mouth daily. 30 tablet 0  . mometasone (NASONEX) 50 MCG/ACT nasal spray Place 2 sprays into the nose daily. 17 g 12  . nortriptyline (PAMELOR) 10 MG capsule Take 1 capsule (10 mg total) by mouth at bedtime. 30 capsule 3  . pantoprazole (PROTONIX) 40 MG tablet Take 1 tablet (40 mg total) by mouth daily. 90 tablet 3  . valACYclovir (VALTREX) 1000 MG tablet Take 1 tab daily for 5 days during outbreaks. 30 tablet 1   No current facility-administered medications on file prior to visit.    Review of Systems:  As per HPI- otherwise negative.   Physical  Examination: Vitals:   11/13/19 1620  BP: 135/88  Pulse: 78  Resp: 18  Temp: 98 F (36.7 C)  SpO2: 98%   Vitals:   11/13/19 1620  Weight: 257 lb (116.6 kg)  Height: 5\' 3"  (1.6 m)   Body mass index is 45.53 kg/m. Ideal Body Weight: Weight in (lb) to have BMI = 25: 140.8  GEN: no acute distress. Obese, otherwise looks well HEENT: Atraumatic, Normocephalic.   Bilateral TM wnl, oropharynx normal.  PEERL,EOMI.   Ears and Nose: No external deformity. CV: RRR, No M/G/R. No JVD. No thrill. No extra heart sounds. PULM: CTA B, no wheezes, crackles, rhonchi. No retractions. No resp. distress. No accessory muscle use. ABD: S, NT, ND EXTR: No c/c/e PSYCH: Normally interactive. Conversant.  Normal strength, sensation, DTR's of all extremities. Neck is supple with no meningismus. Normal strength and sensation of facial muscles   Assessment and Plan: Migraine without aura and without status migrainosus, not intractable - Plan: ketorolac (TORADOL) injection 60 mg   Patient is here today with typical migraine headache, she has used Toradol injection for this previously. We will give 60 mg of Toradol IM. I have asked her to alert [place] if this does not resolve her headache as it typically does. If she were to develop the worst headache of life, please go to the ER. She states understanding and agreement  This visit occurred during the SARS-CoV-2 public health emergency.  Safety protocols were in place, including screening questions prior to the visit, additional usage of staff PPE, and extensive cleaning of exam room while observing appropriate contact time as indicated for disinfecting solutions.    Signed Lamar Blinks, MD

## 2019-11-20 ENCOUNTER — Ambulatory Visit: Payer: 59 | Admitting: Obstetrics & Gynecology

## 2019-11-24 ENCOUNTER — Ambulatory Visit (INDEPENDENT_AMBULATORY_CARE_PROVIDER_SITE_OTHER): Payer: 59 | Admitting: Obstetrics & Gynecology

## 2019-11-24 ENCOUNTER — Other Ambulatory Visit: Payer: Self-pay

## 2019-11-24 ENCOUNTER — Encounter: Payer: Self-pay | Admitting: Obstetrics & Gynecology

## 2019-11-24 VITALS — BP 130/90 | HR 72 | Wt 258.0 lb

## 2019-11-24 DIAGNOSIS — Z3202 Encounter for pregnancy test, result negative: Secondary | ICD-10-CM

## 2019-11-24 DIAGNOSIS — R102 Pelvic and perineal pain: Secondary | ICD-10-CM | POA: Diagnosis not present

## 2019-11-24 DIAGNOSIS — R109 Unspecified abdominal pain: Secondary | ICD-10-CM | POA: Diagnosis not present

## 2019-11-24 DIAGNOSIS — D251 Intramural leiomyoma of uterus: Secondary | ICD-10-CM

## 2019-11-24 LAB — POCT URINE PREGNANCY: Preg Test, Ur: NEGATIVE

## 2019-11-24 MED ORDER — DICLOFENAC POTASSIUM 50 MG PO TABS: 50.0000 mg | ORAL_TABLET | Freq: Three times a day (TID) | ORAL | 2 refills | Status: DC

## 2019-11-24 NOTE — Patient Instructions (Signed)
Pelvic Pain, Female Pelvic pain is pain in your lower abdomen, below your belly button and between your hips. The pain may start suddenly (be acute), keep coming back (be recurring), or last a long time (become chronic). Pelvic pain that lasts longer than 6 months is considered chronic. Pelvic pain may affect your:  Reproductive organs.  Urinary system.  Digestive tract.  Musculoskeletal system. There are many potential causes of pelvic pain. Sometimes, the pain can be a result of digestive or urinary conditions, strained muscles or ligaments, or reproductive conditions. Sometimes the cause of pelvic pain is not known. Follow these instructions at home:   Take over-the-counter and prescription medicines only as told by your health care provider.  Rest as told by your health care provider.  Do not have sex if it hurts.  Keep a journal of your pelvic pain. Write down: ? When the pain started. ? Where the pain is located. ? What seems to make the pain better or worse, such as food or your period (menstrual cycle). ? Any symptoms you have along with the pain.  Keep all follow-up visits as told by your health care provider. This is important. Contact a health care provider if:  Medicine does not help your pain.  Your pain comes back.  You have new symptoms.  You have abnormal vaginal discharge or bleeding, including bleeding after menopause.  You have a fever or chills.  You are constipated.  You have blood in your urine or stool.  You have foul-smelling urine.  You feel weak or light-headed. Get help right away if:  You have sudden severe pain.  Your pain gets steadily worse.  You have severe pain along with fever, nausea, vomiting, or excessive sweating.  You lose consciousness. Summary  Pelvic pain is pain in your lower abdomen, below your belly button and between your hips.  There are many potential causes of pelvic pain.  Keep a journal of your pelvic  pain. This information is not intended to replace advice given to you by your health care provider. Make sure you discuss any questions you have with your health care provider. Document Revised: 12/19/2017 Document Reviewed: 12/19/2017 Elsevier Patient Education  2020 [employer]  

## 2019-11-24 NOTE — Progress Notes (Signed)
Patient reports having some stomach cramps for past several weeks now.  She reports bowel movements are normal. Last unprotected sex two days ago Last pap 07/12/2017

## 2019-11-24 NOTE — Progress Notes (Signed)
History:  29 y.o. G2P1011 here today for eval of pelvic pain that has bene present for 3 weeks. Pt denies assoc sx. There is no increased bleeding or discharge. Pt is married and monogamous. Her LMP was on time but, this cycle was NOT assoc with the usual cramping that normally accompanies her cycles. The pain developed AFTER her cycle and has not been relieved with Tylenol or OTC NSAIDS.   Pt and her husband have been sexually active with no contraception for 2 years with no pregnancy. They both have a child from prev relationships. They had a SAB 11/20219      The following portions of the patient's history were reviewed and updated as appropriate: allergies, current medications, past family history, past medical history, past social history, past surgical history and problem list.  Review of Systems:  Pertinent items are noted in HPI.    Objective:  Physical Exam Blood pressure 130/90, pulse 72, weight 258 lb (117 kg), last menstrual period 11/02/2019.  CONSTITUTIONAL: Well-developed, well-nourished female in no acute distress.  HENT:  Normocephalic, atraumatic EYES: Conjunctivae and EOM are normal. No scleral icterus.  NECK: Normal range of motion SKIN: Skin is warm and dry. No rash noted. Not diaphoretic.No pallor. Howe: Alert and oriented to person, place, and time. Normal coordination.  Abd: Soft, nontender and nondistended Pelvic: Normal appearing external genitalia; normal appearing vaginal mucosa and cervix.  Normal discharge.  Uterus enlarged- exam limited by pts body habitus. No other palpable masses appreciated, no uterine or adnexal tenderness  Labs and Imaging 05/14/2018 CLINICAL DATA:  Left-sided pelvic pain for 2 weeks.  EXAM: TRANSABDOMINAL AND TRANSVAGINAL ULTRASOUND OF PELVIS  TECHNIQUE: Both transabdominal and transvaginal ultrasound examinations of the pelvis were performed. Transabdominal technique was performed for global imaging of the pelvis including  uterus, ovaries, adnexal regions, and pelvic cul-de-sac. It was necessary to proceed with endovaginal exam following the transabdominal exam to visualize the endometrium and ovaries.  COMPARISON:  04/16/2014  FINDINGS: Uterus  Measurements: 7.8 x 3.8 x 4.5 cm (volume = 70 cm^3). Small intramural fibroid is seen in the right posterior corpus measuring 1.4 cm.  Endometrium  Thickness: 6 mm.  No focal abnormality visualized.  Right ovary  Measurements: 3.0 x 1.9 x 2.4 cm (volume = 7.2 cm^3). Normal appearance/no adnexal mass.  Left ovary  Measurements: 3.8 x 2.5 x 3.2 cm (volume = 16 cm^3). A small corpus luteum is noted. Normal appearance/no adnexal mass.  Other findings  No abnormal free fluid.  IMPRESSION: 1.4 cm intramural fibroid in the right posterior uterine corpus.  Normal appearance of both ovaries.  No adnexal mass identified.  Assessment & Plan:  Pelvic pain- Pt with a h/o uterine fibroids.   Pelvic US  Cataflam 50mg  1 po tid prn pain  F/u in 4 weeks or sooner prn   Lack of conception >2 years.   Consider HSG   Pt wants to discuss on next visit.   Total face-to-face time with patient was 18 min.  Greater than 50% was spent in counseling and coordination of care with the patient.   Amirr Achord L. Harraway-Smith, M.D., Cherlynn June

## 2019-11-26 ENCOUNTER — Ambulatory Visit (HOSPITAL_BASED_OUTPATIENT_CLINIC_OR_DEPARTMENT_OTHER)
Admission: RE | Admit: 2019-11-26 | Discharge: 2019-11-26 | Disposition: A | Discharge status: Home / Self Care | Payer: 59 | Source: Ambulatory Visit | Attending: Obstetrics & Gynecology | Admitting: Obstetrics & Gynecology

## 2019-11-26 ENCOUNTER — Other Ambulatory Visit: Payer: Self-pay

## 2019-11-26 DIAGNOSIS — R102 Pelvic and perineal pain: Secondary | ICD-10-CM | POA: Diagnosis not present

## 2019-11-26 DIAGNOSIS — D251 Intramural leiomyoma of uterus: Secondary | ICD-10-CM | POA: Diagnosis not present

## 2019-11-26 DIAGNOSIS — D259 Leiomyoma of uterus, unspecified: Secondary | ICD-10-CM | POA: Diagnosis not present

## 2019-12-01 ENCOUNTER — Other Ambulatory Visit (HOSPITAL_BASED_OUTPATIENT_CLINIC_OR_DEPARTMENT_OTHER): Payer: 59

## 2019-12-01 ENCOUNTER — Ambulatory Visit: Payer: 59 | Admitting: Psychology

## 2019-12-02 ENCOUNTER — Telehealth: Payer: Self-pay

## 2019-12-02 NOTE — Telephone Encounter (Signed)
Patient called back and made aware that Dr. Ihor Dow reviewed her ultrasound and it was no change from the previous ultrasound. Patient states that she will just call back if she has any further questions or concerns. Kathrene Alu RN

## 2019-12-22 ENCOUNTER — Ambulatory Visit: Payer: 59 | Admitting: Obstetrics & Gynecology

## 2019-12-29 ENCOUNTER — Ambulatory Visit: Payer: 59 | Admitting: Psychology

## 2020-01-09 ENCOUNTER — Encounter: Payer: Self-pay | Admitting: Family Medicine

## 2020-01-09 ENCOUNTER — Telehealth (INDEPENDENT_AMBULATORY_CARE_PROVIDER_SITE_OTHER): Payer: 59 | Admitting: Family Medicine

## 2020-01-09 DIAGNOSIS — B9689 Other specified bacterial agents as the cause of diseases classified elsewhere: Secondary | ICD-10-CM

## 2020-01-09 DIAGNOSIS — N760 Acute vaginitis: Secondary | ICD-10-CM

## 2020-01-09 MED ORDER — METRONIDAZOLE 500 MG PO TABS: 500.0000 mg | ORAL_TABLET | Freq: Two times a day (BID) | ORAL | 0 refills | Status: AC

## 2020-01-09 MED ORDER — METRONIDAZOLE 500 MG PO TABS: 500.0000 mg | ORAL_TABLET | Freq: Two times a day (BID) | ORAL | 0 refills | Status: DC

## 2020-01-09 MED FILL — METRONIDAZOLE 500 MG TABS: 500 | 7 days supply | Qty: 14 | Fill #0

## 2020-01-09 NOTE — Addendum Note (Signed)
Addended by: Ames Coupe on: 01/09/2020 06:54 PM   Modules accepted: Orders

## 2020-01-09 NOTE — Progress Notes (Signed)
CC: Vag d/c  Danielle Garcia is a 29 y.o. female here for vaginal discharge. Due to COVID-19 pandemic, we are interacting via web portal for an electronic face-to-face visit. I verified patient's ID using 2 identifiers. Patient agreed to proceed with visit via this method. Patient is at work, I am at office. Patient and I are present for visit.   Duration: 2 days Description of discharge: white Odor: Yes New sexual partner: No Urinary complaints: No IUD? No Denies fevers, bleeding, pregnancy, abdominal pain.  Past Medical History:  Diagnosis Date  . Anxiety   . Asthma   . Depression   . Floaters   . GERD (gastroesophageal reflux disease)   . HTN (hypertension)   . Migraine   . Pre-diabetes    Family History  Problem Relation Age of Onset  . Hypertension Mother   . Heart attack Father 5  . Obesity Father   . Hypertension Maternal Grandmother   . Diabetes Maternal Grandmother   . Lung cancer Maternal Grandfather   . Diabetes Paternal Grandmother   . Allergies Daughter    Exam No conversational dyspnea Age appropriate judgment and insight Nml affect and mood  BV (bacterial vaginosis) - Plan: metroNIDAZOLE (FLAGYL) 500 MG tablet  7 d, bid, no EtOH. F/u prn. Pt voiced understanding and agreement to the plan.  [place], DO 01/09/20 11:54 AM

## 2020-01-13 ENCOUNTER — Other Ambulatory Visit: Payer: Self-pay

## 2020-01-13 ENCOUNTER — Ambulatory Visit (INDEPENDENT_AMBULATORY_CARE_PROVIDER_SITE_OTHER): Payer: 59

## 2020-01-13 DIAGNOSIS — G43009 Migraine without aura, not intractable, without status migrainosus: Secondary | ICD-10-CM | POA: Diagnosis not present

## 2020-01-13 MED ORDER — KETOROLAC TROMETHAMINE 60 MG/2ML IM SOLN
60.0000 mg | Freq: Once | INTRAMUSCULAR | Status: AC
  Administered 2020-01-13: 60 mg via INTRAMUSCULAR

## 2020-01-13 NOTE — Progress Notes (Signed)
Patient came in today for a toradol 60 mg ok per Dr. [name] for Headache.  The patient tolerated injection well.

## 2020-01-16 ENCOUNTER — Ambulatory Visit: Payer: 59 | Admitting: Psychology

## 2020-01-21 ENCOUNTER — Inpatient Hospital Stay (HOSPITAL_COMMUNITY)
Admission: AD | Admit: 2020-01-21 | Discharge: 2020-01-21 | Disposition: A | Discharge status: Home / Self Care | Payer: 59 | Attending: Obstetrics and Gynecology | Admitting: Obstetrics and Gynecology

## 2020-01-21 ENCOUNTER — Inpatient Hospital Stay (HOSPITAL_COMMUNITY): Payer: 59

## 2020-01-21 ENCOUNTER — Encounter (HOSPITAL_COMMUNITY): Payer: Self-pay | Admitting: Obstetrics and Gynecology

## 2020-01-21 ENCOUNTER — Other Ambulatory Visit: Payer: Self-pay

## 2020-01-21 DIAGNOSIS — O99341 Other mental disorders complicating pregnancy, first trimester: Secondary | ICD-10-CM | POA: Insufficient documentation

## 2020-01-21 DIAGNOSIS — O99611 Diseases of the digestive system complicating pregnancy, first trimester: Secondary | ICD-10-CM | POA: Diagnosis not present

## 2020-01-21 DIAGNOSIS — J45909 Unspecified asthma, uncomplicated: Secondary | ICD-10-CM | POA: Insufficient documentation

## 2020-01-21 DIAGNOSIS — Z3A00 Weeks of gestation of pregnancy not specified: Secondary | ICD-10-CM | POA: Diagnosis not present

## 2020-01-21 DIAGNOSIS — O3411 Maternal care for benign tumor of corpus uteri, first trimester: Secondary | ICD-10-CM | POA: Insufficient documentation

## 2020-01-21 DIAGNOSIS — Z79899 Other long term (current) drug therapy: Secondary | ICD-10-CM | POA: Diagnosis not present

## 2020-01-21 DIAGNOSIS — O26891 Other specified pregnancy related conditions, first trimester: Secondary | ICD-10-CM | POA: Insufficient documentation

## 2020-01-21 DIAGNOSIS — O99511 Diseases of the respiratory system complicating pregnancy, first trimester: Secondary | ICD-10-CM | POA: Insufficient documentation

## 2020-01-21 DIAGNOSIS — R1031 Right lower quadrant pain: Secondary | ICD-10-CM | POA: Insufficient documentation

## 2020-01-21 DIAGNOSIS — O3680X0 Pregnancy with inconclusive fetal viability, not applicable or unspecified: Secondary | ICD-10-CM

## 2020-01-21 DIAGNOSIS — O10911 Unspecified pre-existing hypertension complicating pregnancy, first trimester: Secondary | ICD-10-CM | POA: Diagnosis not present

## 2020-01-21 DIAGNOSIS — Z3A08 8 weeks gestation of pregnancy: Secondary | ICD-10-CM | POA: Insufficient documentation

## 2020-01-21 DIAGNOSIS — F329 Major depressive disorder, single episode, unspecified: Secondary | ICD-10-CM | POA: Insufficient documentation

## 2020-01-21 DIAGNOSIS — Z791 Long term (current) use of non-steroidal anti-inflammatories (NSAID): Secondary | ICD-10-CM | POA: Insufficient documentation

## 2020-01-21 DIAGNOSIS — K219 Gastro-esophageal reflux disease without esophagitis: Secondary | ICD-10-CM | POA: Diagnosis not present

## 2020-01-21 DIAGNOSIS — O26899 Other specified pregnancy related conditions, unspecified trimester: Secondary | ICD-10-CM

## 2020-01-21 DIAGNOSIS — Z7951 Long term (current) use of inhaled steroids: Secondary | ICD-10-CM | POA: Diagnosis not present

## 2020-01-21 DIAGNOSIS — D259 Leiomyoma of uterus, unspecified: Secondary | ICD-10-CM | POA: Insufficient documentation

## 2020-01-21 LAB — CBC
HCT: 37.8 % (ref 36.0–46.0)
Hemoglobin: 12.4 g/dL (ref 12.0–15.0)
MCH: 29.7 pg (ref 26.0–34.0)
MCHC: 32.8 g/dL (ref 30.0–36.0)
MCV: 90.6 fL (ref 80.0–100.0)
Platelets: 303 10*3/uL (ref 150–400)
RBC: 4.17 MIL/uL (ref 3.87–5.11)
RDW: 12.7 % (ref 11.5–15.5)
WBC: 5.7 10*3/uL (ref 4.0–10.5)
nRBC: 0 % (ref 0.0–0.2)

## 2020-01-21 LAB — URINALYSIS, ROUTINE W REFLEX MICROSCOPIC
Bilirubin Urine: NEGATIVE
Glucose, UA: NEGATIVE mg/dL
Hgb urine dipstick: NEGATIVE
Ketones, ur: NEGATIVE mg/dL
Leukocytes,Ua: NEGATIVE
Nitrite: NEGATIVE
Protein, ur: NEGATIVE mg/dL
Specific Gravity, Urine: 1.02 (ref 1.005–1.030)
pH: 5 (ref 5.0–8.0)

## 2020-01-21 LAB — ABO/RH: ABO/RH(D): A POS

## 2020-01-21 LAB — TYPE AND SCREEN
ABO/RH(D): A POS
Antibody Screen: NEGATIVE

## 2020-01-21 LAB — WET PREP, GENITAL
Clue Cells Wet Prep HPF POC: NONE SEEN
Sperm: NONE SEEN
Trich, Wet Prep: NONE SEEN

## 2020-01-21 LAB — POCT PREGNANCY, URINE: Preg Test, Ur: POSITIVE — AB

## 2020-01-21 LAB — HCG, QUANTITATIVE, PREGNANCY: hCG, Beta Chain, Quant, S: 493 m[IU]/mL — ABNORMAL HIGH (ref <5)

## 2020-01-21 MED ORDER — ACETAMINOPHEN 500 MG PO TABS
1000.0000 mg | ORAL_TABLET | Freq: Four times a day (QID) | ORAL | Status: DC | PRN
  Administered 2020-01-21: 1000 mg via ORAL
  Filled 2020-01-21: qty 2

## 2020-01-21 NOTE — Discharge Instructions (Signed)
Abdominal Pain During Pregnancy  Belly (abdominal) pain is common during pregnancy. There are many possible causes. Most of the time, it is not a serious problem. Other times, it can be a sign that something is wrong with the pregnancy. Always tell your doctor if you have belly pain. Follow these instructions at home:  Do not have sex or put anything in your vagina until your pain goes away completely.  Get plenty of rest until your pain gets better.  Drink enough fluid to keep your pee (urine) pale yellow.  Take over-the-counter and prescription medicines only as told by your doctor.  Keep all follow-up visits as told by your doctor. This is important. Contact a doctor if:  Your pain continues or gets worse after resting.  You have lower belly pain that: ? Comes and goes at regular times. ? Spreads to your back. ? Feels like menstrual cramps.  You have pain or burning when you pee (urinate). Get help right away if:  You have a fever or chills.  You have vaginal bleeding.  You are leaking fluid from your vagina.  You are passing tissue from your vagina.  You throw up (vomit) for more than 24 hours.  You have watery poop (diarrhea) for more than 24 hours.  Your baby is moving less than usual.  You feel very weak or faint.  You have shortness of breath.  You have very bad pain in your upper belly. Summary  Belly (abdominal) pain is common during pregnancy. There are many possible causes.  If you have belly pain during pregnancy, tell your doctor right away.  Keep all follow-up visits as told by your doctor. This is important. This information is not intended to replace advice given to you by your health care provider. Make sure you discuss any questions you have with your health care provider. Document Revised: 10/21/2018 Document Reviewed: 10/05/2016 Elsevier Patient Education  2020 Elsevier Inc.  

## 2020-01-21 NOTE — MAU Note (Signed)
.   Danielle Garcia is a 29 y.o. at [redacted]w[redacted]d here in MAU reporting: she had a positive home pregnancy test on Monday and started having pain in her right lower abdomen last night.Denies VB LMP: 11/26/19 Onset of complaint: 01/20/20 PM Pain score: 8 Vitals:   01/21/20 0942 01/21/20 0943  BP:  (!) 155/85  Pulse:  84  Resp:  16  Temp:  98.6 F (37 C)  SpO2: 100%      FHT: Lab orders placed from triage: UA/UPT

## 2020-01-21 NOTE — MAU Provider Note (Signed)
History     CSN: [id number]  Arrival date and time: 01/21/20 7893   First Provider Initiated Contact with Patient 01/21/20 [phone]      Chief Complaint  Patient presents with  . Abdominal Pain   29 y.o. G3P1001 @[redacted]w[redacted]d  presenting with RLQ pain. Pain started last night. Describes as constant cramping, rates pain 8/10. She tried a heating pad that helped some. Denies urinary sx except frequency. Denies VB or discharge. No fevers.  OB History    Gravida  3   Para  1   Term  1   Preterm      AB  1   Living  1     SAB  1   TAB      Ectopic      Multiple      Live Births  1           Past Medical History:  Diagnosis Date  . Anxiety   . Asthma   . Depression   . Floaters   . GERD (gastroesophageal reflux disease)   . HTN (hypertension)   . Migraine   . Pre-diabetes     Past Surgical History:  Procedure Laterality Date  . CESAREAN SECTION    . DILATION AND EVACUATION N/A 07/02/2018   Procedure: DILATATION AND EVACUATION;  Surgeon: Aletha Halim, MD;  Location: [facility];  Service: Gynecology;  Laterality: N/A;  . WISDOM TOOTH EXTRACTION      Family History  Problem Relation Age of Onset  . Hypertension Mother   . Heart attack Father 98  . Obesity Father   . Hypertension Maternal Grandmother   . Diabetes Maternal Grandmother   . Lung cancer Maternal Grandfather   . Diabetes Paternal Grandmother   . Allergies Daughter     Social History   Tobacco Use  . Smoking status: Never Smoker  . Smokeless tobacco: Never Used  Vaping Use  . Vaping Use: Never used  Substance Use Topics  . Alcohol use: Yes    Comment: occasionally  . Drug use: No    Allergies: No Known Allergies  No medications prior to admission.    Review of Systems  Constitutional: Negative for fever.  Gastrointestinal: Positive for abdominal pain and nausea. Negative for constipation, diarrhea and vomiting.  Genitourinary: Positive for frequency. Negative for dysuria, urgency,  vaginal bleeding and vaginal discharge.   Physical Exam   Blood pressure 136/88, pulse 66, temperature 98.6 F (37 C), resp. rate 18, height 5\' 3"  (1.6 m), weight 116.6 kg, last menstrual period 11/26/2019, SpO2 100 %.  Physical Exam Vitals and nursing note reviewed. Exam conducted with a chaperone present.  Constitutional:      General: She is not in acute distress. HENT:     Head: Normocephalic.  Cardiovascular:     Rate and Rhythm: Normal rate.  Pulmonary:     Effort: Pulmonary effort is normal. No respiratory distress.  Abdominal:     General: There is no distension.     Tenderness: There is no abdominal tenderness. There is no guarding or rebound.  Genitourinary:    Comments: External: no lesions or erythema Uterus/Adnexae: non-enlarged, non-tender Cervix closed/long  Skin:    General: Skin is warm and dry.  Neurological:     General: No focal deficit present.     Mental Status: She is alert and oriented to person, place, and time.  Psychiatric:        Mood and Affect: Mood normal.    Results  for orders placed or performed during the hospital encounter of 01/21/20 (from the past 24 hour(s))  Pregnancy, urine POC     Status: Abnormal   Collection Time: 01/21/20  9:39 AM  Result Value Ref Range   Preg Test, Ur POSITIVE (A) NEGATIVE  Urinalysis, Routine w reflex microscopic     Status: None   Collection Time: 01/21/20  9:44 AM  Result Value Ref Range   Color, Urine YELLOW YELLOW   APPearance CLEAR CLEAR   Specific Gravity, Urine 1.020 1.005 - 1.030   pH 5.0 5.0 - 8.0   Glucose, UA NEGATIVE NEGATIVE mg/dL   Hgb urine dipstick NEGATIVE NEGATIVE   Bilirubin Urine NEGATIVE NEGATIVE   Ketones, ur NEGATIVE NEGATIVE mg/dL   Protein, ur NEGATIVE NEGATIVE mg/dL   Nitrite NEGATIVE NEGATIVE   Leukocytes,Ua NEGATIVE NEGATIVE  Wet prep, genital     Status: Abnormal   Collection Time: 01/21/20  9:51 AM   Specimen: PATH Cytology Cervicovaginal Ancillary Only  Result Value  Ref Range   Yeast Wet Prep HPF POC PRESENT (A) NONE SEEN   Trich, Wet Prep NONE SEEN NONE SEEN   Clue Cells Wet Prep HPF POC NONE SEEN NONE SEEN   WBC, Wet Prep HPF POC MODERATE (A) NONE SEEN   Sperm NONE SEEN   CBC     Status: None   Collection Time: 01/21/20 10:40 AM  Result Value Ref Range   WBC 5.7 4.0 - 10.5 K/uL   RBC 4.17 3.87 - 5.11 MIL/uL   Hemoglobin 12.4 12.0 - 15.0 g/dL   HCT 37.8 36 - 46 %   MCV 90.6 80.0 - 100.0 fL   MCH 29.7 26.0 - 34.0 pg   MCHC 32.8 30.0 - 36.0 g/dL   RDW 12.7 11.5 - 15.5 %   Platelets 303 150 - 400 K/uL   nRBC 0.0 0.0 - 0.2 %  hCG, quantitative, pregnancy     Status: Abnormal   Collection Time: 01/21/20 10:40 AM  Result Value Ref Range   hCG, Beta Chain, Quant, S 493 (H) <5 mIU/mL  Type and screen     Status: None (Preliminary result)   Collection Time: 01/21/20 10:40 AM  Result Value Ref Range   ABO/RH(D) PENDING    Antibody Screen PENDING    Sample Expiration      01/24/2020,2359 Performed at [facility], [address], [place], [place] [address]    US OB LESS THAN 14 WEEKS WITH [place] TRANSVAGINAL  Result Date: 01/21/2020 CLINICAL DATA:  Abdominal pain and first-trimester pregnancy. Quantitative beta HCG is pending EXAM: OBSTETRIC <14 WK [place] AND TRANSVAGINAL OB US TECHNIQUE: Transvaginal ultrasound was performed for complete evaluation of the gestation as well as the maternal uterus, adnexal regions, and pelvic cul-de-sac. COMPARISON:  11/26/2019 FINDINGS: No evidence of intra or extra uterine gestational sac. No ovarian enlargement or adnexal mass. A corpus luteum is noted in the left ovary. Multiple heterogeneous and hypoechoic uterine masses measuring up to 2.8 cm anteriorly. No pelvic fluid IMPRESSION: 1. Pregnancy of unknown location. Differential considerations include intrauterine gestation too early to be sonographically visualized, spontaneous abortion, or ectopic pregnancy. Consider follow-up ultrasound in 10 days and serial  quantitative beta HCG follow-up. 2. Fibroid uterus. Electronically Signed   By: Monte Fantasia M.D.   On: 01/21/2020 11:53   MAU Course  Procedures Tylenol  MDM Labs and [place] ordered and reviewed No IUP or adnexal mass seen on [place], findings could indicate early pregnancy, ectopic pregnancy,  or failed pregnancy-discussed with pt. Will follow quant in 48 hrs.  Assessment and Plan   1. Pregnancy, location unknown   2. Abdominal pain affecting pregnancy    Discharge home Follow up at [facility] on 01/23/20 @0815  for stat qhcg Ectopic/SAB precautions Pelvic rest  Allergies as of 01/21/2020   No Known Allergies     Medication List    STOP taking these medications   cyclobenzaprine 10 MG tablet Commonly known as: FLEXERIL   diclofenac 50 MG tablet Commonly known as: CATAFLAM   ergocalciferol 1.25 MG (50000 UT) capsule Commonly known as: VITAMIN D2   levocetirizine 5 MG tablet Commonly known as: XYZAL   lidocaine 2 % jelly Commonly known as: XYLOCAINE   meloxicam 15 MG tablet Commonly known as: MOBIC   mometasone 50 MCG/ACT nasal spray Commonly known as: NASONEX   nortriptyline 10 MG capsule Commonly known as: PAMELOR   pantoprazole 40 MG tablet Commonly known as: PROTONIX     TAKE these medications   valACYclovir 1000 MG tablet Commonly known as: VALTREX Take 1 tab daily for 5 days during outbreaks.      Julianne Handler, CNM 01/21/2020, 12:45 PM

## 2020-01-22 LAB — GC/CHLAMYDIA PROBE AMP (~~LOC~~) NOT AT ARMC
Chlamydia: NEGATIVE
Comment: NEGATIVE
Comment: NORMAL
Neisseria Gonorrhea: NEGATIVE

## 2020-01-23 ENCOUNTER — Telehealth: Payer: Self-pay

## 2020-01-23 ENCOUNTER — Other Ambulatory Visit: Payer: Self-pay

## 2020-01-23 ENCOUNTER — Ambulatory Visit: Payer: 59

## 2020-01-23 VITALS — BP 129/75 | HR 78 | Wt 259.0 lb

## 2020-01-23 DIAGNOSIS — O3680X0 Pregnancy with inconclusive fetal viability, not applicable or unspecified: Secondary | ICD-10-CM

## 2020-01-23 LAB — BETA HCG QUANT (REF LAB): hCG Quant: 1099 m[IU]/mL

## 2020-01-23 NOTE — Progress Notes (Signed)
Pt presents for stat Beta hCG. Pt states she is not having any bleeding or pain. Pt was given lab slip and sent to the lab.  Danielle Garcia l Claudina Oliphant, CMA

## 2020-01-23 NOTE — Progress Notes (Signed)
Chart reviewed - agree with CMA/RN documentation.  ° °

## 2020-01-23 NOTE — Telephone Encounter (Signed)
[facility] called stating pt's Beta hCG increased to 1099.   Pt made aware that her Beta has increased to 1099 and she should keep her new OB appt for 02/17/20. Advised pt to go to [facility] if she has any pain and bleeding heavy like a period. Understanding was voiced.  Ashyah Quizon l Venicia Vandall, CMA

## 2020-01-28 ENCOUNTER — Other Ambulatory Visit: Payer: 59

## 2020-01-30 ENCOUNTER — Other Ambulatory Visit (INDEPENDENT_AMBULATORY_CARE_PROVIDER_SITE_OTHER): Payer: 59

## 2020-01-30 ENCOUNTER — Other Ambulatory Visit: Payer: Self-pay

## 2020-01-30 VITALS — BP 111/64 | HR 61 | Wt 261.0 lb

## 2020-01-30 DIAGNOSIS — O3680X0 Pregnancy with inconclusive fetal viability, not applicable or unspecified: Secondary | ICD-10-CM | POA: Diagnosis not present

## 2020-01-30 DIAGNOSIS — Z3A00 Weeks of gestation of pregnancy not specified: Secondary | ICD-10-CM

## 2020-01-30 DIAGNOSIS — B379 Candidiasis, unspecified: Secondary | ICD-10-CM

## 2020-01-30 DIAGNOSIS — Z3490 Encounter for supervision of normal pregnancy, unspecified, unspecified trimester: Secondary | ICD-10-CM

## 2020-01-30 MED ORDER — TERCONAZOLE 0.4 % VA CREA: 1.0000 | TOPICAL_CREAM | Freq: Every day | VAGINAL | 0 refills | Status: DC

## 2020-01-30 NOTE — Progress Notes (Addendum)
Pt requests repeat Beta and a Rx for yeast infection. Pt states she is not having any bleeding or pain. Pt was given lab slip and sent to the lab.  Nyko Gell l Lizandra Zakrzewski, CMA   Attestation of Attending Supervision of CMA/RN: Evaluation and management procedures were performed by the nurse under my supervision and collaboration.  I have reviewed the nursing note and chart, and I agree with the management and plan.  Carolyn L. Harraway-Smith, M.D., Cherlynn June

## 2020-01-31 LAB — BETA HCG QUANT (REF LAB): hCG Quant: 8284 m[IU]/mL

## 2020-02-07 ENCOUNTER — Encounter (HOSPITAL_COMMUNITY): Payer: Self-pay | Admitting: Obstetrics & Gynecology

## 2020-02-07 ENCOUNTER — Inpatient Hospital Stay (HOSPITAL_COMMUNITY): Payer: 59

## 2020-02-07 ENCOUNTER — Inpatient Hospital Stay (HOSPITAL_COMMUNITY)
Admission: AD | Admit: 2020-02-07 | Discharge: 2020-02-07 | Disposition: A | Discharge status: Home / Self Care | Payer: 59 | Attending: Obstetrics & Gynecology | Admitting: Obstetrics & Gynecology

## 2020-02-07 ENCOUNTER — Other Ambulatory Visit: Payer: Self-pay

## 2020-02-07 DIAGNOSIS — O34219 Maternal care for unspecified type scar from previous cesarean delivery: Secondary | ICD-10-CM

## 2020-02-07 DIAGNOSIS — O209 Hemorrhage in early pregnancy, unspecified: Secondary | ICD-10-CM

## 2020-02-07 DIAGNOSIS — R1030 Lower abdominal pain, unspecified: Secondary | ICD-10-CM | POA: Insufficient documentation

## 2020-02-07 DIAGNOSIS — J45909 Unspecified asthma, uncomplicated: Secondary | ICD-10-CM | POA: Diagnosis not present

## 2020-02-07 DIAGNOSIS — Z3A11 11 weeks gestation of pregnancy: Secondary | ICD-10-CM | POA: Diagnosis not present

## 2020-02-07 DIAGNOSIS — D259 Leiomyoma of uterus, unspecified: Secondary | ICD-10-CM

## 2020-02-07 DIAGNOSIS — O208 Other hemorrhage in early pregnancy: Secondary | ICD-10-CM | POA: Diagnosis not present

## 2020-02-07 DIAGNOSIS — Z3490 Encounter for supervision of normal pregnancy, unspecified, unspecified trimester: Secondary | ICD-10-CM

## 2020-02-07 DIAGNOSIS — O418X10 Other specified disorders of amniotic fluid and membranes, first trimester, not applicable or unspecified: Secondary | ICD-10-CM | POA: Diagnosis not present

## 2020-02-07 DIAGNOSIS — O3411 Maternal care for benign tumor of corpus uteri, first trimester: Secondary | ICD-10-CM

## 2020-02-07 DIAGNOSIS — O26891 Other specified pregnancy related conditions, first trimester: Secondary | ICD-10-CM | POA: Insufficient documentation

## 2020-02-07 DIAGNOSIS — Z3A10 10 weeks gestation of pregnancy: Secondary | ICD-10-CM

## 2020-02-07 DIAGNOSIS — O99511 Diseases of the respiratory system complicating pregnancy, first trimester: Secondary | ICD-10-CM | POA: Insufficient documentation

## 2020-02-07 DIAGNOSIS — O468X1 Other antepartum hemorrhage, first trimester: Secondary | ICD-10-CM

## 2020-02-07 DIAGNOSIS — Z3A01 Less than 8 weeks gestation of pregnancy: Secondary | ICD-10-CM | POA: Diagnosis not present

## 2020-02-07 DIAGNOSIS — Z79899 Other long term (current) drug therapy: Secondary | ICD-10-CM | POA: Diagnosis not present

## 2020-02-07 LAB — COMPREHENSIVE METABOLIC PANEL
ALT: 14 U/L (ref 0–44)
AST: 17 U/L (ref 15–41)
Albumin: 3.3 g/dL — ABNORMAL LOW (ref 3.5–5.0)
Alkaline Phosphatase: 37 U/L — ABNORMAL LOW (ref 38–126)
Anion gap: 7 (ref 5–15)
BUN: 10 mg/dL (ref 6–20)
CO2: 24 mmol/L (ref 22–32)
Calcium: 8.8 mg/dL — ABNORMAL LOW (ref 8.9–10.3)
Chloride: 105 mmol/L (ref 98–111)
Creatinine, Ser: 0.64 mg/dL (ref 0.44–1.00)
GFR calc Af Amer: >60 mL/min (ref >60)
GFR calc non Af Amer: >60 mL/min (ref >60)
Glucose, Bld: 136 mg/dL — ABNORMAL HIGH (ref 70–99)
Potassium: 3.8 mmol/L (ref 3.5–5.1)
Sodium: 136 mmol/L (ref 135–145)
Total Bilirubin: 0.7 mg/dL (ref 0.3–1.2)
Total Protein: 6.4 g/dL — ABNORMAL LOW (ref 6.5–8.1)

## 2020-02-07 LAB — URINALYSIS, ROUTINE W REFLEX MICROSCOPIC
Bilirubin Urine: NEGATIVE
Glucose, UA: >=500 mg/dL — AB
Ketones, ur: NEGATIVE mg/dL
Leukocytes,Ua: NEGATIVE
Nitrite: NEGATIVE
Protein, ur: 30 mg/dL — AB
Specific Gravity, Urine: 1.031 — ABNORMAL HIGH (ref 1.005–1.030)
pH: 6 (ref 5.0–8.0)

## 2020-02-07 LAB — CBC
HCT: 38.4 % (ref 36.0–46.0)
Hemoglobin: 12.7 g/dL (ref 12.0–15.0)
MCH: 30 pg (ref 26.0–34.0)
MCHC: 33.1 g/dL (ref 30.0–36.0)
MCV: 90.6 fL (ref 80.0–100.0)
Platelets: 290 10*3/uL (ref 150–400)
RBC: 4.24 MIL/uL (ref 3.87–5.11)
RDW: 12.9 % (ref 11.5–15.5)
WBC: 6.6 10*3/uL (ref 4.0–10.5)
nRBC: 0 % (ref 0.0–0.2)

## 2020-02-07 LAB — HCG, QUANTITATIVE, PREGNANCY: hCG, Beta Chain, Quant, S: 50076 m[IU]/mL — ABNORMAL HIGH (ref <5)

## 2020-02-07 MED ORDER — PRENATAL VITAMIN 27-0.8 MG PO TABS: 1.0000 | ORAL_TABLET | Freq: Every day | ORAL | Status: DC

## 2020-02-07 NOTE — MAU Provider Note (Signed)
Chief Complaint: Vaginal Bleeding  SUBJECTIVE HPI: Danielle Garcia is a 29 y.o. G3P1011 at [redacted]w[redacted]d by LMP who presents to maternity admissions reporting concern of increased vaginal bleeding. Pt presents today given worsening bleeding. She states that she has been spotting for the past two weeks but woke up this morning with bright red blood in her underwear.  Of note, pt was seen in MAU on 7/7 given concern of RLQ pain at which time US showed pregnancy of unknown location with fibroid uterus. Beta-hcg on 7/7 was 493, then 1,099 on 7/9 and 8,284 on 7/16. She was treated for vaginal yeast infection ~1 week ago with improvement in her symptoms.  Pt has new OB appt scheduled for 02/17/20 and has already established care.  She denies vaginal itching/burning, urinary symptoms, h/a, dizziness, n/v, or fever/chills.    HPI  Past Medical History:  Diagnosis Date   Anxiety    Asthma    Depression    Floaters    GERD (gastroesophageal reflux disease)    HTN (hypertension)    Migraine    Pre-diabetes    Past Surgical History:  Procedure Laterality Date   CESAREAN SECTION     DILATION AND EVACUATION N/A 07/02/2018   Procedure: DILATATION AND EVACUATION;  Surgeon: Aletha Halim, MD;  Location: [facility];  Service: Gynecology;  Laterality: N/A;   WISDOM TOOTH EXTRACTION     Social History   Socioeconomic History   Marital status: Married    Spouse name: Peri Jefferson   Number of children: 1   Years of education: Not on file   Highest education level: Not on file  Occupational History   Occupation: CMA    Employer: [employer]  Tobacco Use   Smoking status: Never Smoker   Smokeless tobacco: Never Used  Scientific laboratory technician Use: Never used  Substance and Sexual Activity   Alcohol use: Yes    Comment: occasionally   Drug use: No   Sexual activity: Yes    Birth control/protection: None  Other Topics Concern   Not on file  Social History Narrative   Not on file    Social Determinants of Health   Financial Resource Strain:    Difficulty of Paying Living Expenses:   Food Insecurity:    Worried About Charity fundraiser in the Last Year:    Arboriculturist in the Last Year:   Transportation Needs:    Film/video editor (Medical):    Lack of Transportation (Non-Medical):   Physical Activity:    Days of Exercise per Week:    Minutes of Exercise per Session:   Stress:    Feeling of Stress :   Social Connections:    Frequency of Communication with Friends and Family:    Frequency of Social Gatherings with Friends and Family:    Attends Religious Services:    Active Member of Clubs or Organizations:    Attends Music therapist:    Marital Status:   Intimate Partner Violence:    Fear of Current or Ex-Partner:    Emotionally Abused:    Physically Abused:    Sexually Abused:    No current facility-administered medications on file prior to encounter.   Current Outpatient Medications on File Prior to Encounter  Medication Sig Dispense Refill   terconazole (TERAZOL 7) 0.4 % vaginal cream Place 1 applicator vaginally at bedtime. Insert 1 applicator intravaginally QHS x 3 days 45 g 0   valACYclovir (VALTREX) 1000  MG tablet Take 1 tab daily for 5 days during outbreaks. 30 tablet 1   No Known Allergies  ROS:  Review of Systems  Constitutional: Negative for chills, fatigue and fever.  Respiratory: Negative for shortness of breath.   Cardiovascular: Negative for chest pain.  Gastrointestinal: Negative for abdominal pain, nausea and vomiting.  Genitourinary: Positive for vaginal bleeding. Negative for dysuria, vaginal discharge and vaginal pain.  Musculoskeletal: Negative for back pain.  Neurological: Negative for headaches.   I have reviewed patient's Past Medical Hx, Surgical Hx, Family Hx, Social Hx, medications and allergies.   Physical Exam   Patient Vitals for the past 24 hrs:  BP Temp Pulse Resp  SpO2 Height Weight  02/07/20 1344 (!) 122/61 -- 96 16 100 % -- --  02/07/20 1100 128/72 -- 88 -- -- -- --  02/07/20 1051 -- -- -- -- 100 % 5\' 3"  (1.6 m) (!) 117.9 kg  02/07/20 1047 (!) 135/75 98.3 F (36.8 C) 86 16 -- -- --   Constitutional: Well-developed, well-nourished female in no acute distress. Sitting up on stretcher. Cardiovascular: normal rate and rhythm. No murmur. Respiratory: normal effort. CTAB in anterior lung fields. GI: Abd soft, non-tender, non-distended. Pos BS x 4. MS: Extremities nontender, no edema, normal ROM Neurologic: Alert and oriented x 4.  GU: deferred given results of ultrasound Skin: warm and well perfused Psych: appropriate affect and mood  FHT 127 per formal US.  LAB RESULTS Results for orders placed or performed during the hospital encounter of 02/07/20 (from the past 24 hour(s))  hCG, quantitative, pregnancy     Status: Abnormal   Collection Time: 02/07/20 11:57 AM  Result Value Ref Range   hCG, Beta Chain, Quant, S 50,076 (H) <5 mIU/mL  Comprehensive metabolic panel     Status: Abnormal   Collection Time: 02/07/20 11:57 AM  Result Value Ref Range   Sodium 136 135 - 145 mmol/L   Potassium 3.8 3.5 - 5.1 mmol/L   Chloride 105 98 - 111 mmol/L   CO2 24 22 - 32 mmol/L   Glucose, Bld 136 (H) 70 - 99 mg/dL   BUN 10 6 - 20 mg/dL   Creatinine, Ser 0.64 0.44 - 1.00 mg/dL   Calcium 8.8 (L) 8.9 - 10.3 mg/dL   Total Protein 6.4 (L) 6.5 - 8.1 g/dL   Albumin 3.3 (L) 3.5 - 5.0 g/dL   AST 17 15 - 41 U/L   ALT 14 0 - 44 U/L   Alkaline Phosphatase 37 (L) 38 - 126 U/L   Total Bilirubin 0.7 0.3 - 1.2 mg/dL   GFR calc non Af Amer >60 >60 mL/min   GFR calc Af Amer >60 >60 mL/min   Anion gap 7 5 - 15  CBC     Status: None   Collection Time: 02/07/20 11:57 AM  Result Value Ref Range   WBC 6.6 4.0 - 10.5 K/uL   RBC 4.24 3.87 - 5.11 MIL/uL   Hemoglobin 12.7 12.0 - 15.0 g/dL   HCT 38.4 36 - 46 %   MCV 90.6 80.0 - 100.0 fL   MCH 30.0 26.0 - 34.0 pg   MCHC  33.1 30.0 - 36.0 g/dL   RDW 12.9 11.5 - 15.5 %   Platelets 290 150 - 400 K/uL   nRBC 0.0 0.0 - 0.2 %  Urinalysis, Routine w reflex microscopic     Status: Abnormal   Collection Time: 02/07/20 12:39 PM  Result Value Ref Range   Color, Urine  AMBER (A) YELLOW   APPearance HAZY (A) CLEAR   Specific Gravity, Urine 1.031 (H) 1.005 - 1.030   pH 6.0 5.0 - 8.0   Glucose, UA >=500 (A) NEGATIVE mg/dL   Hgb urine dipstick LARGE (A) NEGATIVE   Bilirubin Urine NEGATIVE NEGATIVE   Ketones, ur NEGATIVE NEGATIVE mg/dL   Protein, ur 30 (A) NEGATIVE mg/dL   Nitrite NEGATIVE NEGATIVE   Leukocytes,Ua NEGATIVE NEGATIVE   RBC / HPF 0-5 0 - 5 RBC/hpf   WBC, UA 0-5 0 - 5 WBC/hpf   Bacteria, UA RARE (A) NONE SEEN   Squamous Epithelial / LPF 0-5 0 - 5   Mucus PRESENT     --/--/A POS Performed at [facility] [address] [place], [place] [address]  (07/07 1221)  IMAGING US OB LESS THAN 14 WEEKS WITH OB TRANSVAGINAL  Result Date: 02/07/2020 CLINICAL DATA:  Bleeding in first trimester of pregnancy; LMP 12/22/2019 EXAM: OBSTETRIC <14 WK [place] AND TRANSVAGINAL OB US TECHNIQUE: Both transabdominal and transvaginal ultrasound examinations were performed for complete evaluation of the gestation as well as the maternal uterus, adnexal regions, and pelvic cul-de-sac. Transvaginal technique was performed to assess early pregnancy. COMPARISON:  01/21/2020 FINDINGS: Intrauterine gestational sac: Present, single Yolk sac:  Present Embryo:  Present Cardiac Activity: Present Heart Rate: 127 bpm CRL:  7.6 mm   6 w   4 d                  [place] EDC: 09/28/2020 Subchorionic hemorrhage:  Small subchronic hemorrhage Maternal uterus/adnexae: RIGHT ovary normal size and morphology 2.5 x 1.4 x 1.2 cm. LEFT ovary measures 3.4 x 3.0 x 2.0 cm and contains a small corpus luteum. No free pelvic fluid or adnexal masses. Few small uterine leiomyomata are identified, largest fundal 2.6 x 2.5 x 2.5 cm. IMPRESSION: Single live intrauterine  gestation at at 6 weeks 4 days EGA by crown-rump length. Small subchronic hemorrhage. Small uterine leiomyomata. Electronically Signed   By: Lavonia Dana M.D.   On: 02/07/2020 13:16   US OB LESS THAN 14 WEEKS WITH OB TRANSVAGINAL  Result Date: 01/21/2020 CLINICAL DATA:  Abdominal pain and first-trimester pregnancy. Quantitative beta HCG is pending EXAM: OBSTETRIC <14 WK [place] AND TRANSVAGINAL OB US TECHNIQUE: Transvaginal ultrasound was performed for complete evaluation of the gestation as well as the maternal uterus, adnexal regions, and pelvic cul-de-sac. COMPARISON:  11/26/2019 FINDINGS: No evidence of intra or extra uterine gestational sac. No ovarian enlargement or adnexal mass. A corpus luteum is noted in the left ovary. Multiple heterogeneous and hypoechoic uterine masses measuring up to 2.8 cm anteriorly. No pelvic fluid IMPRESSION: 1. Pregnancy of unknown location. Differential considerations include intrauterine gestation too early to be sonographically visualized, spontaneous abortion, or ectopic pregnancy. Consider follow-up ultrasound in 10 days and serial quantitative beta HCG follow-up. 2. Fibroid uterus. Electronically Signed   By: Monte Fantasia M.D.   On: 01/21/2020 11:53    MAU Management/MDM: Orders Placed This Encounter  Procedures   US OB LESS THAN 14 WEEKS WITH OB TRANSVAGINAL   Urinalysis, Routine w reflex microscopic   hCG, quantitative, pregnancy   Comprehensive metabolic panel   CBC   Discharge patient    Meds ordered this encounter  Medications   Prenatal Vit-Fe Fumarate-FA (PRENATAL VITAMIN) 27-0.8 MG TABS    Sig: Take 1 tablet by mouth daily.    Dispense:  30 tablet    Treatments in MAU included physical exam, labs and ultrasound. Pt  discharged with strict bleeding precautions given small subchorionic hemorrhage on [place].  ASSESSMENT & PLAN 1. Subchorionic hematoma in first trimester, single or unspecified fetus   2. Vaginal bleeding in pregnancy, first  trimester  Likely secondary to small subchorionic hematoma, noted on [place] today. Reassuringly, able to r/o ectopic pregnancy and early demise given finding of intrauterine pregnancy on [place] today.  3. Intrauterine pregnancy  -dating [redacted]w[redacted]d today with appropriate uptrend in beta-HCG to >50,000. -encouraged initiation of prenatal vitamins and f/u with prenatal provider on 8/3 as noted  4. Uterine leiomyoma, unspecified location  -Visualized on [place] today. Per [place] read "largest fundal 2.6 x 2.5 x 2.5 cm." Pt aware.   -Discharge home with plan to f/u with established prenatal provider on 8/3.  -Provided strict bleeding precautions for small subchorionic hemorrhage as noted above.  Allergies as of 02/07/2020   No Known Allergies     Medication List    STOP taking these medications   terconazole 0.4 % vaginal cream Commonly known as: TERAZOL 7     TAKE these medications   Prenatal Vitamin 27-0.8 MG Tabs Take 1 tablet by mouth daily.   valACYclovir 1000 MG tablet Commonly known as: VALTREX Take 1 tab daily for 5 days during outbreaks.       Guillermina City, MD OB Fellow  02/07/2020  1:52 PM

## 2020-02-07 NOTE — Discharge Instructions (Signed)
You were seen for concern of vaginal bleeding. Reassuringly, your ultrasound showed an intrauterine pregnancy at 6 weeks 4 days. We recommend pelvic rest for at least 4-6 weeks or until bleeding has resolved.  Please keep your scheduled prenatal appointment or call sooner if concern of worsening bleeding.    Subchorionic Hematoma  A subchorionic hematoma is a gathering of blood between the outer wall of the embryo (chorion) and the inner wall of the womb (uterus). This condition can cause vaginal bleeding. If they cause little or no vaginal bleeding, early small hematomas usually shrink on their own and do not affect your baby or pregnancy. When bleeding starts later in pregnancy, or if the hematoma is larger or occurs in older pregnant women, the condition may be more serious. Larger hematomas may get bigger, which increases the chances of miscarriage. This condition also increases the risk of:  Premature separation of the placenta from the uterus.  Premature (preterm) labor.  Stillbirth. What are the causes? The exact cause of this condition is not known. It occurs when blood is trapped between the placenta and the uterine wall because the placenta has separated from the original site of implantation. What increases the risk? You are more likely to develop this condition if:  You were treated with fertility medicines.  You conceived through in vitro fertilization (IVF). What are the signs or symptoms? Symptoms of this condition include:  Vaginal spotting or bleeding.  Contractions of the uterus. These cause abdominal pain. Sometimes you may have no symptoms and the bleeding may only be seen when ultrasound images are taken (transvaginal ultrasound). How is this diagnosed? This condition is diagnosed based on a physical exam. This includes a pelvic exam. You may also have other tests, including:  Blood tests.  Urine tests.  Ultrasound of the abdomen. How is this  treated? Treatment for this condition can vary. Treatment may include:  Watchful waiting. You will be monitored closely for any changes in bleeding. During this stage: ? The hematoma may be reabsorbed by the body. ? The hematoma may separate the fluid-filled space containing the embryo (gestational sac) from the wall of the womb (endometrium).  Medicines.  Activity restriction. This may be needed until the bleeding stops. Follow these instructions at home:  Stay on bed rest if told to do so by your health care provider.  Do not lift anything that is heavier than 10 lbs. (4.5 kg) or as told by your health care provider.  Do not use any products that contain nicotine or tobacco, such as cigarettes and e-cigarettes. If you need help quitting, ask your health care provider.  Track and write down the number of pads you use each day and how soaked (saturated) they are.  Do not use tampons.  Keep all follow-up visits as told by your health care provider. This is important. Your health care provider may ask you to have follow-up blood tests or ultrasound tests or both. Contact a health care provider if:  You have any vaginal bleeding.  You have a fever. Get help right away if:  You have severe cramps in your stomach, back, abdomen, or pelvis.  You pass large clots or tissue. Save any tissue for your health care provider to look at.  You have more vaginal bleeding, and you faint or become lightheaded or weak. Summary  A subchorionic hematoma is a gathering of blood between the outer wall of the placenta and the uterus.  This condition can cause vaginal bleeding.  Sometimes you may have no symptoms and the bleeding may only be seen when ultrasound images are taken.  Treatment may include watchful waiting, medicines, or activity restriction. This information is not intended to replace advice given to you by your health care provider. Make sure you discuss any questions you have with  your health care provider. Document Revised: 06/15/2017 Document Reviewed: 08/29/2016 Elsevier Patient Education  2020 [employer].

## 2020-02-07 NOTE — MAU Note (Signed)
Pt reports to mau with c/o vag bleeding that started as spotting and increased this morning.  Denies recent intercourse. Denies vag itching or odor.  Pt also reports some mild lower abd cramping.

## 2020-02-10 ENCOUNTER — Telehealth: Payer: Self-pay | Admitting: Family Medicine

## 2020-02-10 MED ORDER — PANTOPRAZOLE SODIUM 40 MG PO TBEC
40.0000 mg | DELAYED_RELEASE_TABLET | Freq: Every day | ORAL | 3 refills | Status: DC
Start: 2020-02-10 — End: 2020-09-10

## 2020-02-10 MED ORDER — VALACYCLOVIR HCL 1 G PO TABS: ORAL_TABLET | ORAL | 1 refills | Status: DC

## 2020-02-10 MED FILL — valACYclovir HCL 1 GM TABS: 1 | 30 days supply | Qty: 30 | Fill #0

## 2020-02-10 MED FILL — PANTOPRAZOLE SOD DR 40 MG T: 40 | 90 days supply | Qty: 90 | Fill #0

## 2020-02-10 NOTE — Telephone Encounter (Signed)
The patient is requesting a refill of Pantoprazole but is not on her list/

## 2020-02-17 ENCOUNTER — Ambulatory Visit (INDEPENDENT_AMBULATORY_CARE_PROVIDER_SITE_OTHER): Payer: 59 | Admitting: Advanced Practice Midwife

## 2020-02-17 ENCOUNTER — Other Ambulatory Visit: Payer: Self-pay | Admitting: Advanced Practice Midwife

## 2020-02-17 ENCOUNTER — Other Ambulatory Visit (HOSPITAL_COMMUNITY)
Admission: RE | Admit: 2020-02-17 | Discharge: 2020-02-17 | Disposition: A | Discharge status: Home / Self Care | Payer: 59 | Source: Ambulatory Visit | Attending: Advanced Practice Midwife | Admitting: Advanced Practice Midwife

## 2020-02-17 ENCOUNTER — Encounter: Payer: Self-pay | Admitting: Advanced Practice Midwife

## 2020-02-17 ENCOUNTER — Other Ambulatory Visit: Payer: Self-pay

## 2020-02-17 VITALS — BP 118/79 | HR 84 | Wt 265.0 lb

## 2020-02-17 DIAGNOSIS — Z3480 Encounter for supervision of other normal pregnancy, unspecified trimester: Secondary | ICD-10-CM | POA: Insufficient documentation

## 2020-02-17 DIAGNOSIS — Z8759 Personal history of other complications of pregnancy, childbirth and the puerperium: Secondary | ICD-10-CM

## 2020-02-17 DIAGNOSIS — B370 Candidal stomatitis: Secondary | ICD-10-CM

## 2020-02-17 DIAGNOSIS — Z86711 Personal history of pulmonary embolism: Secondary | ICD-10-CM | POA: Diagnosis not present

## 2020-02-17 DIAGNOSIS — O0990 Supervision of high risk pregnancy, unspecified, unspecified trimester: Secondary | ICD-10-CM | POA: Insufficient documentation

## 2020-02-17 MED ORDER — NYSTATIN 100000 UNIT/ML MT SUSP
5.0000 mL | Freq: Four times a day (QID) | OROMUCOSAL | 0 refills | Status: DC
Start: 2020-02-17 — End: 2020-04-20

## 2020-02-17 MED ORDER — ENOXAPARIN SODIUM 40 MG/0.4ML ~~LOC~~ SOLN
40.0000 mg | SUBCUTANEOUS | 5 refills | Status: DC
Start: 2020-02-17 — End: 2020-05-11

## 2020-02-17 MED ORDER — DOXYLAMINE-PYRIDOXINE 10-10 MG PO TBEC: 2.0000 | DELAYED_RELEASE_TABLET | Freq: Every evening | ORAL | 5 refills | Status: DC | PRN

## 2020-02-17 MED ORDER — ONDANSETRON 4 MG PO TBDP
4.0000 mg | ORAL_TABLET | Freq: Four times a day (QID) | ORAL | 0 refills | Status: DC | PRN
Start: 2020-02-17 — End: 2020-06-30

## 2020-02-17 MED FILL — ONDANSETRON ODT 4MG TBDP: 4 | 5 days supply | Qty: 20 | Fill #0

## 2020-02-17 MED FILL — ENOXAPARIN SODIUM 40 MG/0.4: 40 | 12 days supply | Qty: 5 | Fill #0

## 2020-02-17 MED FILL — DOXYLAMINE-PYRIDOXINE 10-10: 10-10 | 30 days supply | Qty: 60 | Fill #0

## 2020-02-17 MED FILL — NYSTATIN 100,000 UNITS/ML S: 100000 | 3 days supply | Qty: 60 | Fill #0

## 2020-02-17 NOTE — Progress Notes (Signed)
DATING AND VIABILITY SONOGRAM   Danielle Garcia is a 29 y.o. year old G41P1011 with LMP Patient's last menstrual period was 12/23/2019. which would correlate to  [redacted]w[redacted]d weeks gestation.  She has regular menstrual cycles.   She is here today for a confirmatory initial sonogram.    GESTATION: SINGLETON   FETAL ACTIVITY:          Heart rate         168 bpm          The fetus is active.    GESTATIONAL AGE AND  BIOMETRICS:  Gestational criteria: Estimated Date of Delivery: 09/28/20 by LMP now at [redacted]w[redacted]d  Previous Scans:1  GESTATIONAL SAC           3.35 cm         8-3weeks  CROWN RUMP LENGTH           1.21 cm         7-2weeks                   Difficult to asses due body habitus                                                             AVERAGE EGA(BY THIS SCAN):  8-3 weeks  WORKING EDD( LMP ):  09-28-2020     TECHNICIAN COMMENTS: Patient informed that the ultrasound is considered a limited obstetric ultrasound and is not intended to be a complete ultrasound exam. Patient also informed that the ultrasound is not being completed with the intent of assessing for fetal or placental anomalies or any pelvic abnormalities. Explained that the purpose of today's ultrasound is to assess for fetal heart rate. Patient acknowledges the purpose of the exam and the limitations of the study.     Kathrene Alu 02/17/2020 10:09 AM

## 2020-02-17 NOTE — Progress Notes (Signed)
Subjective:    Danielle Garcia is a G4P1011 [redacted]w[redacted]d being seen today for her first obstetrical visit.  Her obstetrical history is significant for obesity and History of Pulmonary Embolism postpartum 2011 and has history of chronic hypertension.  Had a Cesarean Delivery for 9lb baby.  Did not labor.  Wants TOLAC. Patient does intend to breast feed. Pregnancy history fully reviewed.  Patient reports Irritation of tongue/yellow white coating, things taste bad  Vitals:   02/17/20 0922  BP: 118/79  Pulse: 84  Weight: 265 lb (120.2 kg)    HISTORY: OB History  Gravida Para Term Preterm AB Living  4 1 1   1 1   SAB TAB Ectopic Multiple Live Births  1       1    # Outcome Date GA Lbr Len/2nd Weight Sex Delivery Anes PTL Lv  4 Current           3 SAB 2019          2 Term 03/11/10   9 lb 2 oz (4.139 kg) F CS-LTranv   LIV  1 Gravida            Past Medical History:  Diagnosis Date  . Anxiety   . Asthma   . Depression   . Floaters   . GERD (gastroesophageal reflux disease)   . HTN (hypertension)   . Migraine   . Pre-diabetes    Past Surgical History:  Procedure Laterality Date  . CESAREAN SECTION    . DILATION AND EVACUATION N/A 07/02/2018   Procedure: DILATATION AND EVACUATION;  Surgeon: Aletha Halim, MD;  Location: [facility];  Service: Gynecology;  Laterality: N/A;  . WISDOM TOOTH EXTRACTION     Family History  Problem Relation Age of Onset  . Hypertension Mother   . Heart attack Father 66  . Obesity Father   . Hypertension Maternal Grandmother   . Diabetes Maternal Grandmother   . Lung cancer Maternal Grandfather   . Diabetes Paternal Grandmother   . Allergies Daughter      Exam    Uterus:     Pelvic Exam:    Perineum: Normal Perineum   Vulva: Bartholin's, Urethra, Skene's normal   Vagina:  normal discharge   pH:    Cervix: no cervical motion tenderness   Adnexa: no mass, fullness, tenderness   Bony Pelvis: gynecoid  System: Breast:  normal appearance, no  masses or tenderness   Skin: normal coloration and turgor, no rashes    Neurologic: oriented, grossly non-focal   Extremities: normal strength, tone, and muscle mass   HEENT neck supple with midline trachea   Mouth/Teeth mucous membranes moist, pharynx normal without lesions   Neck supple   Cardiovascular: regular rate and rhythm   Respiratory:  appears well, vitals normal, no respiratory distress, acyanotic, normal RR, ear and throat exam is normal, neck free of mass or lymphadenopathy, chest clear, no wheezing, crepitations, rhonchi, normal symmetric air entry   Abdomen: soft, non-tender; bowel sounds normal; no masses,  no organomegaly   Urinary: urethral meatus normal      Assessment:    Pregnancy: [id number] Patient Active Problem List   Diagnosis Date Noted  . Supervision of high risk pregnancy, antepartum 02/17/2020  . History of pulmonary embolism 02/17/2020  . Oral thrush 02/17/2020  . Asthma 10/07/2019  . Vitamin D deficiency 08/04/2019  . Morbid obesity ([place]) 02/07/2019  . Gastroesophageal reflux disease 11/27/2018  . Back pain 09/25/2018  . Urinary frequency 09/25/2018  .  Other fatigue 02/05/2018  . Shortness of breath on exertion 02/05/2018  . Migraine 02/05/2018  . Essential hypertension 02/05/2018  . 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 Continue prenatal vitamins. Discussed and offered genetic screening options, including Quad screen/AFP, NIPS testing, and option to decline testing. Benefits/risks/alternatives reviewed. Pt aware that anatomy US is form of genetic screening with lower accuracy in detecting trisomies than blood work.  Pt chooses genetic screening today. NIPS: ordered. Ultrasound discussed; fetal anatomic survey: ordered. Problem list reviewed and updated. The nature of [facility] [place] with multiple MDs and other Advanced Practice Providers was explained to patient;  also emphasized that residents, students are part of our team. Routine obstetric precautions reviewed.  R/B of TOLAC vs Repeat C/S discussed .  Wants to [facility]  Rx Nystatin mouthwash for probable thrush  Consulted Dr Kennon Rounds.  Will order coag labs to screen for factors Will start Lovenox 40mg  daily for prophylaxis for now.  Adjust when labs come back .  Follow up in 2 weeks with MD to discuss history PE. 50% of 30 min visit spent on counseling and coordination of care.    [name] 02/17/2020

## 2020-02-17 NOTE — Patient Instructions (Signed)
Bleeding Precautions When on Anticoagulant Therapy, Adult Anticoagulant therapy, also called blood thinner therapy, is medicine that helps to prevent and treat blood clots. The medicine works by stopping blood clots from forming or growing. Blood clots that form in your blood vessels can be dangerous. They can break loose and travel to the heart, lungs, or brain. This increases the risk of a heart attack, stroke, or blocked lung artery (pulmonary embolism). Anticoagulants also increase the risk of bleeding. Try to protect yourself from cuts and other injuries that can cause bleeding. It is important to take anticoagulants exactly as told by your health care provider. Why do I need to be on anticoagulant therapy? You may need this medicine if you are at risk of developing a blood clot. Conditions that increase your risk of a blood clot include:  Being born with heart disease or a heart malformation (congenital heart disease).  Developing heart disease.  Having had surgery, such as valve replacement.  Having had a serious accident or other type of severe injury (trauma).  Having certain types of cancer.  Having certain diseases that can increase blood clotting.  Having a high risk of stroke or heart attack.  Having atrial fibrillation (AF). What are the common anticoagulant medicines? There are several types of anticoagulant medicines. The most common types are:  Medicines that you take by mouth (oral medicines), such as: ? Warfarin. ? Novel oral anticoagulants (NOACs), such as:  Direct thrombin inhibitors (dabigatran).  Factor Xa inhibitors (apixaban, edoxaban, and rivaroxaban).  Injections, such as: ? Unfractionated heparin. ? Low molecular weight heparin. These anticoagulants work in different ways to prevent blood clots. They also have different risks and side effects. What do I need to remember while on anticoagulant therapy? Taking anticoagulants  Take your medicine at the  same time every day. If you forget to take your medicine, take it as soon as you remember. Do not double your dosage of medicine if you miss a whole day. Take your normal dose and call your health care provider.  Do not stop taking your medicine unless your health care provider approves. Stopping the medicine can increase your risk of developing a blood clot. Taking other medicines  Take over-the-counter and prescriptions medicines only as told by your health care provider.  Do not take over-the-counter NSAIDs, including aspirin and ibuprofen, while you are on anticoagulant therapy. These medicines increase your risk of dangerous bleeding.  Get approval from your health care provider before you start taking any new medicines, vitamins, or herbal products. Some of these could interfere with your therapy. General instructions  Keep all follow-up visits as told by your health care provider. This is important.  If you are pregnant or trying to get pregnant, talk with a health care provider about anticoagulants. Some of these medicines are not safe to take during pregnancy.  Tell all health care providers, including your dentist, that you are on anticoagulant therapy. It is especially important to tell providers before you have any surgery, medical procedures, or dental work done. What precautions should I take?   Be very careful when using knives, scissors, or other sharp objects.  Use an electric razor instead of a blade.  Do not use toothpicks.  Use a soft-bristled toothbrush. Brush your teeth gently.  Always wear shoes outdoors and wear slippers indoors.  Be careful when cutting your fingernails and toenails.  Place bath mats in the bathroom. If possible, install handrails as well.  Wear gloves while you do  yard work.  Wear your seat belt.  Prevent falls by removing loose rugs and extension cords from areas where you walk. Use a cane or walker if you need it.  Avoid  constipation by: ? Drinking enough fluid to keep your urine clear or pale yellow. ? Eating foods that are high in fiber, such as fresh fruits and vegetables, whole grains, and beans. ? Limiting foods that are high in fat and processed sugars, such as fried and sweet foods.  Do not play contact sports or participate in other activities that have a high risk for injury. What other precautions are important if on warfarin therapy? If you are taking a type of anticoagulant called warfarin, make sure you:  Work with a diet and nutrition specialist (dietitian) to make an eating plan. Do not make any sudden changes to your diet after you have started your eating plan.  Do not drink alcohol. It can interfere with your medicine and increase your risk of an injury that causes bleeding.  Get regular blood tests as told by your health care provider. What are some questions to ask my health care provider?  Why do I need anticoagulant therapy?  What is the best anticoagulant therapy for my condition?  How long will I need anticoagulant therapy?  What are the side effects of anticoagulant therapy?  When should I take my medicine? What should I do if I forget to take it?  Will I need to have regular blood tests?  Do I need to change my diet? Are there foods or drinks that I should avoid?  What activities are safe for me?  What should I do if I want to get pregnant? Contact a health care provider if:  You miss a dose of medicine: ? And you are not sure what to do. ? For more than one day.  You have: ? Menstrual bleeding that is heavier than normal. ? Bloody or brown urine. ? Easy bruising. ? Black and tarry stool or bright red stool. ? Side effects from your medicine.  You feel weak or dizzy.  You become pregnant. Get help right away if:  You have bleeding that will not stop within 20 minutes from: ? The nose. ? The gums. ? A cut on the skin.  You have a severe headache or  stomachache.  You vomit or cough up blood.  You fall or hit your head. Summary  Anticoagulant therapy, also called blood thinner therapy, is medicine that helps to prevent and treat blood clots.  Anticoagulants work in different ways to prevent blood clots. They also have different risks and side effects.  Talk with your health care provider about any precautions that you should take while on anticoagulant therapy. This information is not intended to replace advice given to you by your health care provider. Make sure you discuss any questions you have with your health care provider. Document Revised: 10/23/2018 Document Reviewed: 09/19/2016 Elsevier Patient Education  [place]. Anticoagulant Injection Instructions Using a Prefilled Syringe  Injectable blood thinners (anticoagulants) are medicines that help prevent blood clots from developing. Two common injectable anticoagulant medicines that are used are fondaparinux and enoxaparin. Injectable anticoagulant medicines are given with a single-use syringe that already has medicine inside of it (prefilled syringe). You inject the medicine through a needle into the layer of fat and tissue between skin and muscle (subcutaneous) in your belly. Before your first injection, your health care provider will instruct you on how to take  your anticoagulant medicine at home. Also read the medication guide or package insert that came with the prefilled syringe. Follow directions from the guide about how to prepare and give the injection. What are the risks? Generally, self-injection of anticoagulant medicine is safe. However, mild problems can occur, including mild bleeding, itching, or rash at the injection site. Other risks may include:  Bleeding and bruising.  A low platelet count (thrombocytopenia).  An allergic reaction to the medication.  Liver damage.  A low red blood cell count (anemia). Supplies needed: To inject the medicine,  you will need:  Alcohol wipes.  A prefilled syringe with needle.  A container for syringe disposal. This may be a puncture-proof sharps container or a hard-sided plastic container with a cover, such as an empty laundry detergent bottle. How to inject anticoagulant medicine 1. Wash your hands with soap and water. If soap and water are not available, use alcohol-based hand sanitizer. 2. Locate the site on your belly where the medicine should be injected. Avoid the area within 2 inches (5 cm) of your navel (umbilicus). 3. Use an alcohol wipe to clean the site where you will be injecting the needle. Let the site air-dry. 4. Remove the plastic cover from the needle on the syringe. Do not let the needle touch anything. 5. Hold the syringe with one hand and use your other hand to twist the rigid needle guard (covering the needle) counterclockwise. Pull the needle guard straight off the needle. Discard the needle guard. 6. When using a prefilled syringe, do not push the air bubble out of the syringe before the injection. The air bubble will help you get all of the medicine out of the syringe and into your subcutaneous tissue. 7. Hold the syringe in your writing hand like a pencil. 8. Use your other hand to pinch and hold about an inch (2.5 cm) of skin. Do not directly touch the cleaned part of the skin. 9. Insert the entire needle straight into the fold of skin. The needle should be at a 90-degree angle (perpendicular) to the skin. Push the needle all the way against the skin. 10. After the needle is completely inserted into the skin, release the skin that you are pinching. Continue to hold the syringe with your writing hand. 11. Use the thumb or index finger of your writing hand to push the plunger all the way into the syringe to inject the medicine. 12. Pull the needle straight out of the skin. 13. Press and hold an alcohol wipe over the injection site until the bleeding stops. Do not rub the  area. 14. Cover the injection site with a bandage, if needed. 15. Do not recap the needle. 16. If your syringe has a safety system for shielding the needle after injection: ? Firmly push down on the plunger after you complete the injection. The protective sleeve will automatically cover the needle, and you will hear a click. The click means that the needle is safely covered. 17. Place the syringe and needle in the disposal container. What else do I need to know?  Do not use the syringe or needle more than one time.  Change the injection site each time you give yourself a shot.  Before an injection, make sure the medicine is a clear and colorless or pale yellow. Do not use your medicine if it is discolored or has particles in it. Let your health care provider know right away.  Tell your health care providers, including dentists: ?  That you are taking an anticoagulant, especially if you are injured or plan to have a procedure. ? If there is a change in your illness, and any other changes in medicines, supplements, or diet.  Take over-the-counter and prescription medicines only as told by your health care provider.  Keep your medicine safely stored at room temperature.  Keep all follow-up visits as told by your health care provider. Contact a health care provider if:  You develop any rashes on your skin.  You have large areas of bruising on your skin.  Your condition worsens.  You develop a fever.  There is blood in your urine.  You are bleeding from your gums. Get help right away if:  You develop bleeding problems such as: ? Vomiting blood or coughing up blood. ? Dark red blood in your urine. ? Blood in your stool or your stool has a dark, tarry, or coffee-ground appearance.  You have bleeding that does not stop.  You develop chest pain or shortness of breath.  You develop a severe headache or confusion. These symptoms may represent a serious problem that is an  emergency. Do not wait to see if the symptoms will go away. Get medical help right away. Call your local emergency services (911 in the [place] Do not drive yourself to the hospital. Summary  Injectable blood thinners (anticoagulants) are medicines that help prevent blood clots from developing in the veins.  You inject the medicine through a needle into the layer of fat and tissue between skin and muscle (subcutaneous) in your belly.  Keep all follow-up visits as told by your health care provider. Follow-up visits include visits for lab tests. This information is not intended to replace advice given to you by your health care provider. Make sure you discuss any questions you have with your health care provider. Document Revised: 08/11/2017 Document Reviewed: 08/11/2017 Elsevier Patient Education  2020 [place] of Pregnancy The first trimester of pregnancy is from week 1 until the end of week 13 (months 1 through 3). A week after a sperm fertilizes an egg, the egg will implant on the wall of the uterus. This embryo will begin to develop into a baby. Genes from you and your partner will form the baby. The female genes will determine whether the baby will be a boy or a girl. At 6-8 weeks, the eyes and face will be formed, and the heartbeat can be seen on ultrasound. At the end of 12 weeks, all the baby's organs will be formed. Now that you are pregnant, you will want to do everything you can to have a healthy baby. Two of the most important things are to get good prenatal care and to follow your health care provider's instructions. Prenatal care is all the medical care you receive before the baby's birth. This care will help prevent, find, and treat any problems during the pregnancy and childbirth. Body changes during your first trimester Your body goes through many changes during pregnancy. The changes vary from woman to woman.  You may gain or lose a couple of pounds at  first.  You may feel sick to your stomach (nauseous) and you may throw up (vomit). If the vomiting is uncontrollable, call your health care provider.  You may tire easily.  You may develop headaches that can be relieved by medicines. All medicines should be approved by your health care provider.  You may urinate more often. Painful urination may mean you have a bladder  infection.  You may develop heartburn as a result of your pregnancy.  You may develop constipation because certain hormones are causing the muscles that push stool through your intestines to slow down.  You may develop hemorrhoids or swollen veins (varicose veins).  Your breasts may begin to grow larger and become tender. Your nipples may stick out more, and the tissue that surrounds them (areola) may become darker.  Your gums may bleed and may be sensitive to brushing and flossing.  Dark spots or blotches (chloasma, mask of pregnancy) may develop on your face. This will likely fade after the baby is born.  Your menstrual periods will stop.  You may have a loss of appetite.  You may develop cravings for certain kinds of food.  You may have changes in your emotions from day to day, such as being excited to be pregnant or being concerned that something may go wrong with the pregnancy and baby.  You may have more vivid and strange dreams.  You may have changes in your hair. These can include thickening of your hair, rapid growth, and changes in texture. Some women also have hair loss during or after pregnancy, or hair that feels dry or thin. Your hair will most likely return to normal after your baby is born. What to expect at prenatal visits During a routine prenatal visit:  You will be weighed to make sure you and the baby are growing normally.  Your blood pressure will be taken.  Your abdomen will be measured to track your baby's growth.  The fetal heartbeat will be listened to between weeks 10 and 14 of your  pregnancy.  Test results from any previous visits will be discussed. Your health care provider may ask you:  How you are feeling.  If you are feeling the baby move.  If you have had any abnormal symptoms, such as leaking fluid, bleeding, severe headaches, or abdominal cramping.  If you are using any tobacco products, including cigarettes, chewing tobacco, and electronic cigarettes.  If you have any questions. Other tests that may be performed during your first trimester include:  Blood tests to find your blood type and to check for the presence of any previous infections. The tests will also be used to check for low iron levels (anemia) and protein on red blood cells (Rh antibodies). Depending on your risk factors, or if you previously had diabetes during pregnancy, you may have tests to check for high blood sugar that affects pregnant women (gestational diabetes).  Urine tests to check for infections, diabetes, or protein in the urine.  An ultrasound to confirm the proper growth and development of the baby.  Fetal screens for spinal cord problems (spina bifida) and Down syndrome.  HIV (human immunodeficiency virus) testing. Routine prenatal testing includes screening for HIV, unless you choose not to have this test.  You may need other tests to make sure you and the baby are doing well. Follow these instructions at home: Medicines  Follow your health care provider's instructions regarding medicine use. Specific medicines may be either safe or unsafe to take during pregnancy.  Take a prenatal vitamin that contains at least 600 micrograms (mcg) of folic acid.  If you develop constipation, try taking a stool softener if your health care provider approves. Eating and drinking   Eat a balanced diet that includes fresh fruits and vegetables, whole grains, good sources of protein such as meat, eggs, or tofu, and low-fat dairy. Your health care provider will  help you determine the  amount of weight gain that is right for you.  Avoid raw meat and uncooked cheese. These carry germs that can cause birth defects in the baby.  Eating four or five small meals rather than three large meals a day may help relieve nausea and vomiting. If you start to feel nauseous, eating a few soda crackers can be helpful. Drinking liquids between meals, instead of during meals, also seems to help ease nausea and vomiting.  Limit foods that are high in fat and processed sugars, such as fried and sweet foods.  To prevent constipation: ? Eat foods that are high in fiber, such as fresh fruits and vegetables, whole grains, and beans. ? Drink enough fluid to keep your urine clear or pale yellow. Activity  Exercise only as directed by your health care provider. Most women can continue their usual exercise routine during pregnancy. Try to exercise for 30 minutes at least 5 days a week. Exercising will help you: ? Control your weight. ? Stay in shape. ? Be prepared for labor and delivery.  Experiencing pain or cramping in the lower abdomen or lower back is a good sign that you should stop exercising. Check with your health care provider before continuing with normal exercises.  Try to avoid standing for long periods of time. Move your legs often if you must stand in one place for a long time.  Avoid heavy lifting.  Wear low-heeled shoes and practice good posture.  You may continue to have sex unless your health care provider tells you not to. Relieving pain and discomfort  Wear a good support bra to relieve breast tenderness.  Take warm sitz baths to soothe any pain or discomfort caused by hemorrhoids. Use hemorrhoid cream if your health care provider approves.  Rest with your legs elevated if you have leg cramps or low back pain.  If you develop varicose veins in your legs, wear support hose. Elevate your feet for 15 minutes, 3-4 times a day. Limit salt in your diet. Prenatal  care  Schedule your prenatal visits by the twelfth week of pregnancy. They are usually scheduled monthly at first, then more often in the last 2 months before delivery.  Write down your questions. Take them to your prenatal visits.  Keep all your prenatal visits as told by your health care provider. This is important. Safety  Wear your seat belt at all times when driving.  Make a list of emergency phone numbers, including numbers for family, friends, the hospital, and police and fire departments. General instructions  Ask your health care provider for a referral to a local prenatal education class. Begin classes no later than the beginning of month 6 of your pregnancy.  Ask for help if you have counseling or nutritional needs during pregnancy. Your health care provider can offer advice or refer you to specialists for help with various needs.  Do not use hot tubs, steam rooms, or saunas.  Do not douche or use tampons or scented sanitary pads.  Do not cross your legs for long periods of time.  Avoid cat litter boxes and soil used by cats. These carry germs that can cause birth defects in the baby and possibly loss of the fetus by miscarriage or stillbirth.  Avoid all smoking, herbs, alcohol, and medicines not prescribed by your health care provider. Chemicals in these products affect the formation and growth of the baby.  Do not use any products that contain nicotine or tobacco, such  as cigarettes and e-cigarettes. If you need help quitting, ask your health care provider. You may receive counseling support and other resources to help you quit.  Schedule a dentist appointment. At home, brush your teeth with a soft toothbrush and be gentle when you floss. Contact a health care provider if:  You have dizziness.  You have mild pelvic cramps, pelvic pressure, or nagging pain in the abdominal area.  You have persistent nausea, vomiting, or diarrhea.  You have a bad smelling vaginal  discharge.  You have pain when you urinate.  You notice increased swelling in your face, hands, legs, or ankles.  You are exposed to fifth disease or chickenpox.  You are exposed to [place] measles (rubella) and have never had it. Get help right away if:  You have a fever.  You are leaking fluid from your vagina.  You have spotting or bleeding from your vagina.  You have severe abdominal cramping or pain.  You have rapid weight gain or loss.  You vomit blood or material that looks like coffee grounds.  You develop a severe headache.  You have shortness of breath.  You have any kind of trauma, such as from a fall or a car accident. Summary  The first trimester of pregnancy is from week 1 until the end of week 13 (months 1 through 3).  Your body goes through many changes during pregnancy. The changes vary from woman to woman.  You will have routine prenatal visits. During those visits, your health care provider will examine you, discuss any test results you may have, and talk with you about how you are feeling. This information is not intended to replace advice given to you by your health care provider. Make sure you discuss any questions you have with your health care provider. Document Revised: 06/15/2017 Document Reviewed: 06/14/2016 Elsevier Patient Education  2020 [employer].

## 2020-02-18 LAB — CYTOLOGY - PAP
Chlamydia: NEGATIVE
Comment: NEGATIVE
Comment: NORMAL
Diagnosis: NEGATIVE
Neisseria Gonorrhea: NEGATIVE

## 2020-02-19 LAB — ACTIVATED PROTEIN C RESISTANCE: Activated Protein C Resistance: 2.7 ratio (ref 2.2–3.5)

## 2020-02-20 LAB — CULTURE, OB URINE

## 2020-02-20 LAB — URINE CULTURE, OB REFLEX

## 2020-02-21 LAB — PROTEIN S PANEL
Protein S Activity: 58 % — ABNORMAL LOW (ref 63–140)
Protein S Ag, Free: 63 % (ref 57–157)
Protein S Ag, Total: 74 % (ref 60–150)

## 2020-02-21 LAB — FACTOR 5 LEIDEN

## 2020-02-21 LAB — LUPUS ANTICOAGULANT PANEL
Dilute Viper Venom Time: 31.3 s (ref 0.0–47.0)
PTT Lupus Anticoagulant: 34.6 s (ref 0.0–51.9)

## 2020-02-21 LAB — PROTHROMBIN GENE MUTATION

## 2020-02-22 ENCOUNTER — Inpatient Hospital Stay (HOSPITAL_COMMUNITY)
Admission: AD | Admit: 2020-02-22 | Discharge: 2020-02-22 | Disposition: A | Discharge status: Home / Self Care | Payer: 59 | Attending: Obstetrics & Gynecology | Admitting: Obstetrics & Gynecology

## 2020-02-22 ENCOUNTER — Other Ambulatory Visit: Payer: Self-pay

## 2020-02-22 ENCOUNTER — Inpatient Hospital Stay (HOSPITAL_COMMUNITY): Payer: 59

## 2020-02-22 ENCOUNTER — Encounter (HOSPITAL_COMMUNITY): Payer: Self-pay | Admitting: Obstetrics & Gynecology

## 2020-02-22 DIAGNOSIS — Z7901 Long term (current) use of anticoagulants: Secondary | ICD-10-CM | POA: Diagnosis not present

## 2020-02-22 DIAGNOSIS — O99511 Diseases of the respiratory system complicating pregnancy, first trimester: Secondary | ICD-10-CM | POA: Diagnosis not present

## 2020-02-22 DIAGNOSIS — K219 Gastro-esophageal reflux disease without esophagitis: Secondary | ICD-10-CM | POA: Insufficient documentation

## 2020-02-22 DIAGNOSIS — O468X1 Other antepartum hemorrhage, first trimester: Secondary | ICD-10-CM

## 2020-02-22 DIAGNOSIS — O208 Other hemorrhage in early pregnancy: Secondary | ICD-10-CM | POA: Insufficient documentation

## 2020-02-22 DIAGNOSIS — O209 Hemorrhage in early pregnancy, unspecified: Secondary | ICD-10-CM

## 2020-02-22 DIAGNOSIS — Z8759 Personal history of other complications of pregnancy, childbirth and the puerperium: Secondary | ICD-10-CM

## 2020-02-22 DIAGNOSIS — Z3A09 9 weeks gestation of pregnancy: Secondary | ICD-10-CM | POA: Insufficient documentation

## 2020-02-22 DIAGNOSIS — J45909 Unspecified asthma, uncomplicated: Secondary | ICD-10-CM | POA: Diagnosis not present

## 2020-02-22 DIAGNOSIS — Z86711 Personal history of pulmonary embolism: Secondary | ICD-10-CM | POA: Insufficient documentation

## 2020-02-22 DIAGNOSIS — Z79899 Other long term (current) drug therapy: Secondary | ICD-10-CM | POA: Diagnosis not present

## 2020-02-22 DIAGNOSIS — O10911 Unspecified pre-existing hypertension complicating pregnancy, first trimester: Secondary | ICD-10-CM | POA: Diagnosis not present

## 2020-02-22 DIAGNOSIS — O3411 Maternal care for benign tumor of corpus uteri, first trimester: Secondary | ICD-10-CM | POA: Insufficient documentation

## 2020-02-22 DIAGNOSIS — D259 Leiomyoma of uterus, unspecified: Secondary | ICD-10-CM | POA: Insufficient documentation

## 2020-02-22 DIAGNOSIS — Z3A08 8 weeks gestation of pregnancy: Secondary | ICD-10-CM

## 2020-02-22 DIAGNOSIS — O418X10 Other specified disorders of amniotic fluid and membranes, first trimester, not applicable or unspecified: Secondary | ICD-10-CM

## 2020-02-22 DIAGNOSIS — O99611 Diseases of the digestive system complicating pregnancy, first trimester: Secondary | ICD-10-CM | POA: Diagnosis not present

## 2020-02-22 LAB — URINALYSIS, ROUTINE W REFLEX MICROSCOPIC
Bilirubin Urine: NEGATIVE
Glucose, UA: NEGATIVE mg/dL
Ketones, ur: NEGATIVE mg/dL
Leukocytes,Ua: NEGATIVE
Nitrite: NEGATIVE
Protein, ur: NEGATIVE mg/dL
Specific Gravity, Urine: 1.028 (ref 1.005–1.030)
pH: 6 (ref 5.0–8.0)

## 2020-02-22 LAB — CBC
HCT: 37.1 % (ref 36.0–46.0)
Hemoglobin: 12.2 g/dL (ref 12.0–15.0)
MCH: 29.5 pg (ref 26.0–34.0)
MCHC: 32.9 g/dL (ref 30.0–36.0)
MCV: 89.6 fL (ref 80.0–100.0)
Platelets: 331 10*3/uL (ref 150–400)
RBC: 4.14 MIL/uL (ref 3.87–5.11)
RDW: 12.6 % (ref 11.5–15.5)
WBC: 8.2 10*3/uL (ref 4.0–10.5)
nRBC: 0 % (ref 0.0–0.2)

## 2020-02-22 LAB — HCG, QUANTITATIVE, PREGNANCY: hCG, Beta Chain, Quant, S: 99424 m[IU]/mL — ABNORMAL HIGH (ref <5)

## 2020-02-22 NOTE — [facility] Note (Signed)
Danielle Garcia is a 29 y.o. at [redacted]w[redacted]d here in [facility] reporting: bleeding and cramping that started within the past hour. States bleeding soaked through her clothes and onto the floor, put a pad on and came to the hospital. Has not checked for more bleeding. Had a couple of clots.  Onset of complaint: today  Pain score: 4/10  Vitals:   02/22/20 1810  BP: 124/75  Pulse: 91  Resp: 20  Temp: 98.7 F (37.1 C)  SpO2: 100%     Lab orders placed from triage: [facility]

## 2020-02-22 NOTE — MAU Provider Note (Addendum)
Chief Complaint: Vaginal Bleeding  SUBJECTIVE HPI: Danielle Garcia is a 29 y.o. G3P1011 at [redacted]w[redacted]d by L/6 who presents to maternity admissions reporting concern of vaginal bleeding s/p starting lovenox on 8/3 per prenatal provider. Pt reports that 1 hour prior to arrival to MAU she went to the bathroom and passed 2 large dark red clots into the toilet bowl water. No additional bleeding since this episode. No associated abdominal pain. She also denies vaginal itching/burning, urinary symptoms, h/a, dizziness, n/v, or fever/chills. No vaginal intercourse for the past 3 days.  Pt was previously seen in MAU for concern of vaginal bleeding on 7/24, at which time an IUP was confirmed and [place] also notable for small subchorionic hemorrhage and uterine leiomyoma (largest fundal 2.6 x 2.5 x 2.5 cm). Given her history of postpartum PE with prior pregnancy, pt was started on lovenox in clinic at her initial prenatal visit on 8/3 per Dr. Kennon Rounds. Coagulopathy workup from clinic on 8/3 currently pending.  HPI  Past Medical History:  Diagnosis Date   Anxiety    Asthma    Depression    Floaters    GERD (gastroesophageal reflux disease)    HTN (hypertension)    Migraine    Pre-diabetes    Past Surgical History:  Procedure Laterality Date   CESAREAN SECTION     DILATION AND EVACUATION N/A 07/02/2018   Procedure: DILATATION AND EVACUATION;  Surgeon: Aletha Halim, MD;  Location: [facility];  Service: Gynecology;  Laterality: N/A;   WISDOM TOOTH EXTRACTION     Social History   Socioeconomic History   Marital status: Married    Spouse name: Peri Jefferson   Number of children: 1   Years of education: Not on file   Highest education level: Not on file  Occupational History   Occupation: CMA    Employer:   Tobacco Use   Smoking status: Never Smoker   Smokeless tobacco: Never Used  Scientific laboratory technician Use: Never used  Substance and Sexual Activity   Alcohol use: Yes     Comment: occasionally   Drug use: No   Sexual activity: Yes    Birth control/protection: None  Other Topics Concern   Not on file  Social History Narrative   Not on file   Social Determinants of Health   Financial Resource Strain:    Difficulty of Paying Living Expenses:   Food Insecurity:    Worried About Charity fundraiser in the Last Year:    Arboriculturist in the Last Year:   Transportation Needs:    Film/video editor (Medical):    Lack of Transportation (Non-Medical):   Physical Activity:    Days of Exercise per Week:    Minutes of Exercise per Session:   Stress:    Feeling of Stress :   Social Connections:    Frequency of Communication with Friends and Family:    Frequency of Social Gatherings with Friends and Family:    Attends Religious Services:    Active Member of Clubs or Organizations:    Attends Music therapist:    Marital Status:   Intimate Partner Violence:    Fear of Current or Ex-Partner:    Emotionally Abused:    Physically Abused:    Sexually Abused:    No current facility-administered medications on file prior to encounter.   Current Outpatient Medications on File Prior to Encounter  Medication Sig Dispense Refill   enoxaparin (LOVENOX) 40 MG/0.4ML  injection Inject 0.4 mLs (40 mg total) into the skin daily. 12 mL 5   Doxylamine-Pyridoxine (DICLEGIS) 10-10 MG TBEC Take 2 tablets by mouth at bedtime as needed. 60 tablet 5   levocetirizine (XYZAL) 5 MG tablet      nystatin (MYCOSTATIN) 100000 UNIT/ML suspension Take 5 mLs (500,000 Units total) by mouth 4 (four) times daily. 60 mL 0   ondansetron (ZOFRAN ODT) 4 MG disintegrating tablet Take 1 tablet (4 mg total) by mouth every 6 (six) hours as needed for nausea. 20 tablet 0   pantoprazole (PROTONIX) 40 MG tablet Take 1 tablet (40 mg total) by mouth daily. 30 tablet 3   Prenatal Vit-Fe Fumarate-FA (PRENATAL VITAMIN) 27-0.8 MG TABS Take 1 tablet by mouth  daily. 30 tablet    valACYclovir (VALTREX) 1000 MG tablet Take 1 tab daily for 5 days during outbreaks. 30 tablet 1   No Known Allergies  ROS:  Review of Systems  Constitutional: Negative for activity change, appetite change, chills and fever.  HENT: Negative for congestion and sore throat.   Respiratory: Negative for chest tightness and shortness of breath.   Cardiovascular: Negative for chest pain and leg swelling.  Gastrointestinal: Negative for abdominal distention, abdominal pain, blood in stool, constipation, diarrhea, nausea and vomiting.  Genitourinary: Positive for vaginal bleeding. Negative for difficulty urinating, dyspareunia, dysuria, flank pain, pelvic pain, vaginal discharge and vaginal pain.  Musculoskeletal: Negative for back pain.  Neurological: Negative for headaches.   I have reviewed patient's Past Medical Hx, Surgical Hx, Family Hx, Social Hx, medications and allergies.   Physical Exam   Patient Vitals for the past 24 hrs:  BP Temp Temp src Pulse Resp SpO2 Height Weight  02/22/20 2109 124/76 -- -- 92 -- -- -- --  02/22/20 1810 124/75 98.7 F (37.1 C) Oral 91 20 100 % -- --  02/22/20 1805 -- -- -- -- -- -- 5\' 3"  (1.6 m) 119.7 kg   Constitutional: Well-developed, well-nourished female in no acute distress.  Cardiovascular: normal rate Respiratory: normal effort GI: Abd soft, non-tender. Pos BS x 4 MS: Extremities nontender, no edema, normal ROM Neurologic: Alert and oriented x 4.  GU:  PELVIC EXAM: Cervix pink, visually closed, without lesion, vaginal walls and external genitalia normal, small amount of dark red blood in vaginal vault. Bimanual exam: deferred  FHT 179 per formal ultrasound   LAB RESULTS Results for orders placed or performed during the hospital encounter of 02/22/20 (from the past 24 hour(s))  Urinalysis, Routine w reflex microscopic Urine, Clean Catch     Status: Abnormal   Collection Time: 02/22/20  6:29 PM  Result Value Ref Range    Color, Urine YELLOW YELLOW   APPearance CLEAR CLEAR   Specific Gravity, Urine 1.028 1.005 - 1.030   pH 6.0 5.0 - 8.0   Glucose, UA NEGATIVE NEGATIVE mg/dL   Hgb urine dipstick LARGE (A) NEGATIVE   Bilirubin Urine NEGATIVE NEGATIVE   Ketones, ur NEGATIVE NEGATIVE mg/dL   Protein, ur NEGATIVE NEGATIVE mg/dL   Nitrite NEGATIVE NEGATIVE   Leukocytes,Ua NEGATIVE NEGATIVE   RBC / HPF 0-5 0 - 5 RBC/hpf   WBC, UA 0-5 0 - 5 WBC/hpf   Bacteria, UA RARE (A) NONE SEEN   Squamous Epithelial / LPF 0-5 0 - 5   Mucus PRESENT   CBC     Status: None   Collection Time: 02/22/20  7:16 PM  Result Value Ref Range   WBC 8.2 4.0 - 10.5 K/uL  RBC 4.14 3.87 - 5.11 MIL/uL   Hemoglobin 12.2 12.0 - 15.0 g/dL   HCT 37.1 36 - 46 %   MCV 89.6 80.0 - 100.0 fL   MCH 29.5 26.0 - 34.0 pg   MCHC 32.9 30.0 - 36.0 g/dL   RDW 12.6 11.5 - 15.5 %   Platelets 331 150 - 400 K/uL   nRBC 0.0 0.0 - 0.2 %  hCG, quantitative, pregnancy     Status: Abnormal   Collection Time: 02/22/20  7:16 PM  Result Value Ref Range   hCG, Beta Chain, Quant, S 99,424 (H) <5 mIU/mL    --/--/A POS Performed at [facility], [place] [address], [place], [place] [address]  [id number])  IMAGING US OB Comp Less 14 Wks  Result Date: 02/22/2020 CLINICAL DATA:  Bleeding and cramping EXAM: OBSTETRIC <14 WK ULTRASOUND TECHNIQUE: Transabdominal ultrasound was performed for evaluation of the gestation as well as the maternal uterus and adnexal regions. COMPARISON:  01/08/2020 FINDINGS: Intrauterine gestational sac: Single Yolk sac:  Visualized. Embryo:  Visualized. Cardiac Activity: Visualized. Heart Rate: 179 bpm CRL:   23.3 mm   9 w 0 d                  [place] EDC: 09/26/2020 Subchorionic hemorrhage:  A small subchorionic hemorrhage is seen Maternal uterus/adnexae: The ovaries are normal in appearance. There are 2 intrauterine fibroids seen. The largest in the anterior fundus measuring 2.9 x 3.2 x 3.5 cm. IMPRESSION: Single live intrauterine pregnancy  measuring 9 weeks 0 days. Small subchorionic hemorrhage Intrauterine fibroids Electronically Signed   By: Prudencio Pair M.D.   On: 02/22/2020 20:12   US OB LESS THAN 14 WEEKS WITH OB TRANSVAGINAL  Result Date: 02/07/2020 CLINICAL DATA:  Bleeding in first trimester of pregnancy; LMP 12/22/2019 EXAM: OBSTETRIC <14 WK [place] AND TRANSVAGINAL OB US TECHNIQUE: Both transabdominal and transvaginal ultrasound examinations were performed for complete evaluation of the gestation as well as the maternal uterus, adnexal regions, and pelvic cul-de-sac. Transvaginal technique was performed to assess early pregnancy. COMPARISON:  01/21/2020 FINDINGS: Intrauterine gestational sac: Present, single Yolk sac:  Present Embryo:  Present Cardiac Activity: Present Heart Rate: 127 bpm CRL:  7.6 mm   6 w   4 d                  [place] EDC: 09/28/2020 Subchorionic hemorrhage:  Small subchronic hemorrhage Maternal uterus/adnexae: RIGHT ovary normal size and morphology 2.5 x 1.4 x 1.2 cm. LEFT ovary measures 3.4 x 3.0 x 2.0 cm and contains a small corpus luteum. No free pelvic fluid or adnexal masses. Few small uterine leiomyomata are identified, largest fundal 2.6 x 2.5 x 2.5 cm. IMPRESSION: Single live intrauterine gestation at at 6 weeks 4 days EGA by crown-rump length. Small subchronic hemorrhage. Small uterine leiomyomata. Electronically Signed   By: Lavonia Dana M.D.   On: 02/07/2020 13:16    MAU Management/MDM: Orders Placed This Encounter  Procedures   US OB Comp Less 14 Wks   Urinalysis, Routine w reflex microscopic Urine, Clean Catch   CBC   hCG, quantitative, pregnancy   Discharge patient    No orders of the defined types were placed in this encounter.   Physical exam, labs and ultrasound performed in the MAU. Pt discharged with strict bleeding precautions.  ASSESSMENT 1. Vaginal bleeding in pregnancy, first trimester: Kahleah Crass is a 29 y.o. G3P1011 at [redacted]w[redacted]d by L/6 who presents to maternity admissions reporting  concern of vaginal bleeding s/p starting lovenox on 8/3 per prenatal provider in consultation with Dr. Kennon Rounds given history of postpartum PE. Small amount of dark red blood in vaginal vault. Reassuringly, repeat US and beta-hcg consistent with current GA. Small subchorionic hemorrhage and intrauterine fibroids also present, consistent with prior US on 02/07/20. Infectious workup deferred given recently unremarkable without new GU symptoms today. -provided reassurance given no evidence of threatened miscarriage; bleeding most likely precipitated by initiation of lovenox in s/o small subchorionic hemorrhage -recommended pelvic rest and continued use of lovenox as previously prescribed until next prenatal appt on 8/9 (will message Dr. Kennon Rounds regarding pt's visit in MAU today given recent  recommendation for lovenox in pregnancy) -provided strict return precautions for worsening vaginal bleeding (saturating a pad), abdominal pain or other concerns   PLAN Discharge home Allergies as of 02/22/2020   No Known Allergies     Medication List    TAKE these medications   Doxylamine-Pyridoxine 10-10 MG Tbec Commonly known as: Diclegis Take 2 tablets by mouth at bedtime as needed.   enoxaparin 40 MG/0.4ML injection Commonly known as: LOVENOX Inject 0.4 mLs (40 mg total) into the skin daily.   levocetirizine 5 MG tablet Commonly known as: XYZAL   ondansetron 4 MG disintegrating tablet Commonly known as: Zofran ODT Take 1 tablet (4 mg total) by mouth every 6 (six) hours as needed for nausea.   pantoprazole 40 MG tablet Commonly known as: PROTONIX Take 1 tablet (40 mg total) by mouth daily.   Prenatal Vitamin 27-0.8 MG Tabs Take 1 tablet by mouth daily.   valACYclovir 1000 MG tablet Commonly known as: VALTREX Take 1 tab daily for 5 days during outbreaks.     ASK your doctor about these medications   nystatin 100000 UNIT/ML suspension Commonly known as: MYCOSTATIN Take 5 mLs (500,000 Units  total) by mouth 4 (four) times daily.       Guillermina City, MD OB Fellow, [facility] 02/23/2020  10:10 AM

## 2020-02-22 NOTE — Discharge Instructions (Signed)
Your ultrasound and labs were appropriate for your current stage in pregnancy.  We recommend that you continue your lovenox as prescribed.  Please call the clinic if you experience worsening bleeding prior to your next prenatal appointment  Vaginal Bleeding During Pregnancy, First Trimester  A small amount of bleeding (spotting) from the vagina is common during early pregnancy. Sometimes the bleeding is normal and does not cause problems. At other times, though, bleeding may be a sign of something serious. Tell your doctor about any bleeding from your vagina right away. Follow these instructions at home: Activity  Follow your doctor's instructions about how active you can be.  If needed, make plans for someone to help with your normal activities.  Do not have sex or orgasms until your doctor says that this is safe. General instructions  Take over-the-counter and prescription medicines only as told by your doctor.  Watch your condition for any changes.  Write down: ? The number of pads you use each day. ? How often you change pads. ? How soaked (saturated) your pads are.  Do not use tampons.  Do not douche.  If you pass any tissue from your vagina, save it to show to your doctor.  Keep all follow-up visits as told by your doctor. This is important. Contact a doctor if:  You have vaginal bleeding at any time while you are pregnant.  You have cramps.  You have a fever. Get help right away if:  You have very bad cramps in your back or belly (abdomen).  You pass large clots or a lot of tissue from your vagina.  Your bleeding gets worse.  You feel light-headed.  You feel weak.  You pass out (faint).  You have chills.  You are leaking fluid from your vagina.  You have a gush of fluid from your vagina. Summary  Sometimes vaginal bleeding during pregnancy is normal and does not cause problems. At other times, bleeding may be a sign of something serious.  Tell  your doctor about any bleeding from your vagina right away.  Follow your doctor's instructions about how active you can be. You may need someone to help you with your normal activities. This information is not intended to replace advice given to you by your health care provider. Make sure you discuss any questions you have with your health care provider. Document Revised: 10/22/2018 Document Reviewed: 10/04/2016 Elsevier Patient Education  [employer].

## 2020-02-23 ENCOUNTER — Encounter: Payer: Self-pay | Admitting: Obstetrics & Gynecology

## 2020-02-23 ENCOUNTER — Ambulatory Visit (INDEPENDENT_AMBULATORY_CARE_PROVIDER_SITE_OTHER): Payer: 59 | Admitting: Obstetrics & Gynecology

## 2020-02-23 VITALS — BP 135/75 | HR 91 | Wt 265.0 lb

## 2020-02-23 DIAGNOSIS — I10 Essential (primary) hypertension: Secondary | ICD-10-CM

## 2020-02-23 DIAGNOSIS — O0991 Supervision of high risk pregnancy, unspecified, first trimester: Secondary | ICD-10-CM

## 2020-02-23 DIAGNOSIS — J4520 Mild intermittent asthma, uncomplicated: Secondary | ICD-10-CM

## 2020-02-23 DIAGNOSIS — O0990 Supervision of high risk pregnancy, unspecified, unspecified trimester: Secondary | ICD-10-CM

## 2020-02-23 DIAGNOSIS — O26851 Spotting complicating pregnancy, first trimester: Secondary | ICD-10-CM

## 2020-02-23 DIAGNOSIS — Z86711 Personal history of pulmonary embolism: Secondary | ICD-10-CM

## 2020-02-23 DIAGNOSIS — Z3A08 8 weeks gestation of pregnancy: Secondary | ICD-10-CM | POA: Diagnosis not present

## 2020-02-23 DIAGNOSIS — B009 Herpesviral infection, unspecified: Secondary | ICD-10-CM

## 2020-02-23 NOTE — Progress Notes (Signed)
   PRENATAL VISIT NOTE  Subjective:  Danielle Garcia is a 29 y.o. G3P1011 at [redacted]w[redacted]d being seen today for ongoing prenatal care.  She is currently monitored for the following issues for this high-risk pregnancy and has HSV-2 (herpes simplex virus 2) infection; Other fatigue; Shortness of breath on exertion; Migraine; Essential hypertension; Back pain; Urinary frequency; Gastroesophageal reflux disease; Morbid obesity ([place]); Vitamin D deficiency; Supervision of high risk pregnancy, antepartum; Asthma; History of pulmonary embolism; and Oral thrush on their problem list.  Patient reports bleeding.Pt was seen in the ED after heavy bleeding yesterday 2 days after starting Lovenox. She was seen in the MAU and had a viable IUP. She had more bleeding when she went home last night. She denies pain. She did not take her Lovenox today.Contractions: Not present. Vag. Bleeding: Scant today.  Movement: Absent. Denies leaking of fluid.   The following portions of the patient's history were reviewed and updated as appropriate: allergies, current medications, past family history, past medical history, past social history, past surgical history and problem list.   Objective:   Vitals:   02/23/20 1038  BP: 135/75  Pulse: 91  Weight: 265 lb (120.2 kg)    Fetal Status:     Movement: Absent     General:  Alert, oriented and cooperative. Patient is in no acute distress.  Skin: Skin is warm and dry. No rash noted.   Cardiovascular: Normal heart rate noted  Respiratory: Normal respiratory effort, no problems with respiration noted  Abdomen: Soft, gravid, appropriate for gestational age.  Pain/Pressure: Absent     Pelvic: Cervical exam deferred        Extremities: Normal range of motion.  Edema: None  Mental Status: Normal mood and affect. Normal behavior. Normal judgment and thought content.   Assessment and Plan:  Pregnancy: [id number] at [redacted]w[redacted]d 1. Supervision of high risk pregnancy, antepartum Bleeding in 1st  trimester.  Viable IUP confirmed via [place].  Pt requested exemption of COVID vaccine. After discussion, she wants to wait until after her 1st trimester.       2. History of pulmonary embolism Discussed with pt whether to take the Lovenox. We discussed the risk vs benefits of early Lovenox re increased risk of bleeding vs risk of blood clots. Pt will restart the Lovenox at [redacted] weeks gestation. We discussed the s/sx of a DVT vs PE. All questions were answered.         3. Morbid obesity ([place])  4. Mild intermittent asthma without complication  5. HSV-2 (herpes simplex virus 2) infection  6. Essential hypertension  Preterm labor symptoms and general obstetric precautions including but not limited to vaginal bleeding, contractions, leaking of fluid and fetal movement were reviewed in detail with the patient. Please refer to After Visit Summary for other counseling recommendations.   F/u in 4 weeks or sooner prn  Future Appointments  Date Time Provider [place]  03/05/2020 10:30 AM [name], [place] [facility]  03/18/2020  3:15 PM Truett Mainland, DO [facility] None    Lavonia Drafts, MD

## 2020-02-23 NOTE — Progress Notes (Signed)
Per provider obtained ultrasound for fetal viability.  Fetus moving on ultrasound and FHR 170bpm. Kathrene Alu RN

## 2020-02-26 LAB — SMN1 COPY NUMBER ANALYSIS (SMA CARRIER SCREENING)

## 2020-02-26 LAB — CBC/D/PLT+RPR+RH+ABO+RUB AB...
Antibody Screen: NEGATIVE
Basophils Absolute: 0 10*3/uL (ref 0.0–0.2)
Basos: 0 %
EOS (ABSOLUTE): 0.1 10*3/uL (ref 0.0–0.4)
Eos: 1 %
HCV Ab: <0.1 s/co ratio (ref 0.0–0.9)
HIV Screen 4th Generation wRfx: NONREACTIVE
Hematocrit: 40.3 % (ref 34.0–46.6)
Hemoglobin: 13.3 g/dL (ref 11.1–15.9)
Hepatitis B Surface Ag: NEGATIVE
Immature Grans (Abs): 0 10*3/uL (ref 0.0–0.1)
Immature Granulocytes: 0 %
Lymphocytes Absolute: 2.4 10*3/uL (ref 0.7–3.1)
Lymphs: 34 %
MCH: 30 pg (ref 26.6–33.0)
MCHC: 33 g/dL (ref 31.5–35.7)
MCV: 91 fL (ref 79–97)
Monocytes Absolute: 0.5 10*3/uL (ref 0.1–0.9)
Monocytes: 7 %
Neutrophils Absolute: 4 10*3/uL (ref 1.4–7.0)
Neutrophils: 58 %
Platelets: 315 10*3/uL (ref 150–450)
RBC: 4.43 x10E6/uL (ref 3.77–5.28)
RDW: 12.2 % (ref 11.7–15.4)
RPR Ser Ql: NONREACTIVE
Rh Factor: POSITIVE
Rubella Antibodies, IGG: 1.12 index (ref >0.99)
WBC: 7 10*3/uL (ref 3.4–10.8)

## 2020-02-26 LAB — HCV INTERPRETATION

## 2020-02-26 LAB — HEMOGLOBPATHY+FER W/A THAL RFX
Ferritin: 20 ng/mL (ref 15–150)
Hgb A2: 2.6 % (ref 1.8–3.2)
Hgb A: 97.4 % (ref 96.4–98.8)
Hgb F: 0 % (ref 0.0–2.0)
Hgb S: 0 %

## 2020-02-26 LAB — HEMOGLOBIN A1C
Est. average glucose Bld gHb Est-mCnc: 137 mg/dL
Hgb A1c MFr Bld: 6.4 % — ABNORMAL HIGH (ref 4.8–5.6)

## 2020-03-02 ENCOUNTER — Ambulatory Visit (INDEPENDENT_AMBULATORY_CARE_PROVIDER_SITE_OTHER): Payer: 59 | Admitting: Family Medicine

## 2020-03-02 ENCOUNTER — Encounter: Payer: Self-pay | Admitting: Family Medicine

## 2020-03-02 ENCOUNTER — Other Ambulatory Visit: Payer: Self-pay

## 2020-03-02 ENCOUNTER — Telehealth: Payer: 59 | Admitting: Advanced Practice Midwife

## 2020-03-02 ENCOUNTER — Encounter: Payer: 59 | Admitting: Family Medicine

## 2020-03-02 VITALS — BP 120/80 | HR 106 | Temp 98.7°F | Ht 63.0 in | Wt 264.0 lb

## 2020-03-02 DIAGNOSIS — Z0000 Encounter for general adult medical examination without abnormal findings: Secondary | ICD-10-CM

## 2020-03-02 NOTE — Patient Instructions (Addendum)
Give [place] 2-3 business days to get the results of your labs back.   Keep the diet clean and stay active.  I recommend getting the flu shot in mid October. This suggestion would change if the CDC comes out with a different recommendation.   Ice/cold pack over area for 10-15 min twice daily.  Consider a [place] sock to wear at night.   Let us know if you need anything.   Plantar Fasciitis Stretches/exercises Do exercises exactly as told by your health care provider and adjust them as directed. It is normal to feel mild stretching, pulling, tightness, or discomfort as you do these exercises, but you should stop right away if you feel sudden pain or your pain gets worse.   Stretching and range of motion exercises These exercises warm up your muscles and joints and improve the movement and flexibility of your foot. These exercises also help to relieve pain.  Exercise A: Plantar fascia stretch 1. Sit with your left / right leg crossed over your opposite knee. 2. Hold your heel with one hand with that thumb near your arch. With your other hand, hold your toes and gently pull them back toward the top of your foot. You should feel a stretch on the bottom of your toes or your foot or both. 3. Hold this stretch for 30 seconds. 4. Slowly release your toes and return to the starting position. Repeat 2 times. Complete this exercise 3 times per week.  Exercise B: Gastroc, standing 1. Stand with your hands against a wall. 2. Extend your left / right leg behind you, and bend your front knee slightly. 3. Keeping your heels on the floor and keeping your back knee straight, shift your weight toward the wall without arching your back. You should feel a gentle stretch in your left / right calf. 4. Hold this position for 30 seconds. Repeat 2 times. Complete this exercise 3 times a week. Exercise C: Soleus, standing 1. Stand with your hands against a wall. 2. Extend your left / right leg behind you, and  bend your front knee slightly. 3. Keeping your heels on the floor, bend your back knee and slightly shift your weight over the back leg. You should feel a gentle stretch deep in your calf. 4. Hold this position for 30 seconds. Repeat 2 times. Complete this exercise 3 times per week. Exercise D: Gastrocsoleus, standing 1. Stand with the ball of your left / right foot on a step. The ball of your foot is on the walking surface, right under your toes. 2. Keep your other foot firmly on the same step. 3. Hold onto the wall or a railing for balance. 4. Slowly lift your other foot, allowing your body weight to press your heel down over the edge of the step. You should feel a stretch in your left / right calf. 5. Hold this position for 30 seconds. 6. Return both feet to the step. 7. Repeat this exercise with a slight bend in your left / right knee. Repeat 2 times with your left / right knee straight and 2times with your left / right knee bent. Complete this exercise 3 times a week.  Balance exercise This exercise builds your balance and strength control of your arch to help take pressure off your plantar fascia. Exercise E: Single leg stand 1. Without shoes, stand near a railing or in a doorway. You may hold onto the railing or door frame as needed. 2. Stand on your left /  right foot. Keep your big toe down on the floor and try to keep your arch lifted. Do not let your foot roll inward. 3. Hold this position for 30 seconds. 4. If this exercise is too easy, you can try it with your eyes closed or while standing on a pillow. Repeat 2 times. Complete this exercise 3 times per week. This information is not intended to replace advice given to you by your health care provider. Make sure you discuss any questions you have with your health care provider. Document Released: 07/03/2005 Document Revised: 03/07/2016 Document Reviewed: 05/17/2015 Elsevier Interactive Patient Education  2017 [employer].

## 2020-03-02 NOTE — Progress Notes (Signed)
Chief Complaint  Patient presents with   Annual Exam     Well Woman Danielle Garcia is here for a complete physical.   Her last physical was >1 year ago.  Current diet: in general, a "healthy" diet. Current exercise: None currently. Contraception? No she is pregnant Fatigue out of ordinary? None considering she is pregnant Seatbelt? Yes   Health Maintenance Pap/HPV- Yes Tetanus- Yes HIV screening- Yes Hep C screening- Yes  Past Medical History:  Diagnosis Date   Anxiety    Asthma    Depression    Floaters    GERD (gastroesophageal reflux disease)    HTN (hypertension)    Migraine    Pre-diabetes      Past Surgical History:  Procedure Laterality Date   CESAREAN SECTION     DILATION AND EVACUATION N/A 07/02/2018   Procedure: DILATATION AND EVACUATION;  Surgeon: Aletha Halim, MD;  Location: [facility];  Service: Gynecology;  Laterality: N/A;   WISDOM TOOTH EXTRACTION      Medications  Current Outpatient Medications on File Prior to Visit  Medication Sig Dispense Refill   Doxylamine-Pyridoxine (DICLEGIS) 10-10 MG TBEC Take 2 tablets by mouth at bedtime as needed. 60 tablet 5   enoxaparin (LOVENOX) 40 MG/0.4ML injection Inject 0.4 mLs (40 mg total) into the skin daily. 12 mL 5   levocetirizine (XYZAL) 5 MG tablet      nystatin (MYCOSTATIN) 100000 UNIT/ML suspension Take 5 mLs (500,000 Units total) by mouth 4 (four) times daily. 60 mL 0   ondansetron (ZOFRAN ODT) 4 MG disintegrating tablet Take 1 tablet (4 mg total) by mouth every 6 (six) hours as needed for nausea. 20 tablet 0   pantoprazole (PROTONIX) 40 MG tablet Take 1 tablet (40 mg total) by mouth daily. 30 tablet 3   Prenatal Vit-Fe Fumarate-FA (PRENATAL VITAMIN) 27-0.8 MG TABS Take 1 tablet by mouth daily. 30 tablet    valACYclovir (VALTREX) 1000 MG tablet Take 1 tab daily for 5 days during outbreaks. 30 tablet 1    Allergies No Known Allergies  Review of Systems: Constitutional:  no  unexpected weight changes Eye:  no recent significant change in vision Ear/Nose/Mouth/Throat:  Ears:  no tinnitus or vertigo and no recent change in hearing Nose/Mouth/Throat:  no complaints of nasal congestion, no sore throat Cardiovascular: no chest pain Respiratory:  no cough and no shortness of breath Gastrointestinal:  no abdominal pain, no change in bowel habits GU:  Female: negative for dysuria or pelvic pain Musculoskeletal/Extremities:  +R heel pain Integumentary (Skin/Breast):  no abnormal skin lesions reported Neurologic:  no headaches Endocrine:  denies fatigue Hematologic/Lymphatic:  No areas of easy bleeding  Exam BP 120/80 (BP Location: Left Arm, Patient Position: Sitting, Cuff Size: Large)    Pulse (!) 106    Temp 98.7 F (37.1 C) (Oral)    Ht 5\' 3"  (1.6 m)    Wt 264 lb (119.7 kg)    LMP 12/23/2019    SpO2 98%    BMI 46.77 kg/m  General:  well developed, well nourished, in no apparent distress Skin:  no significant moles, warts, or growths Head:  no masses, lesions, or tenderness Eyes:  pupils equal and round, sclera anicteric without injection Ears:  canals without lesions, TMs shiny without retraction, no obvious effusion, no erythema Nose:  nares patent, septum midline, mucosa normal, and no drainage or sinus tenderness Throat/Pharynx:  lips and gingiva without lesion; tongue and uvula midline; non-inflamed pharynx; no exudates or postnasal drainage Neck: neck  supple without adenopathy, thyromegaly, or masses Lungs:  clear to auscultation, breath sounds equal bilaterally, no respiratory distress Cardio:  regular rate and rhythm, no bruits, no LE edema Abdomen:  abdomen soft, nontender; bowel sounds normal; no masses or organomegaly Genital: Defer to GYN Musculoskeletal:  +TTP over the R prox insertion of plantar fascia Extremities:  no clubbing, cyanosis, or edema, no deformities, no skin discoloration Neuro:  gait normal; deep tendon reflexes normal and  symmetric Psych: well oriented with normal range of affect and appropriate judgment/insight  Assessment and Plan  Well adult exam - Plan: CBC, Comprehensive metabolic panel, Lipid panel   Well 29 y.o. female. Counseled on diet and exercise. Other orders as above. Follow up in 1 yr or prn. The patient voiced understanding and agreement to the plan.  Thomas, DO 03/02/20 2:15 PM

## 2020-03-05 ENCOUNTER — Encounter: Payer: 59 | Admitting: Family Medicine

## 2020-03-09 ENCOUNTER — Other Ambulatory Visit (INDEPENDENT_AMBULATORY_CARE_PROVIDER_SITE_OTHER): Payer: 59

## 2020-03-09 ENCOUNTER — Other Ambulatory Visit: Payer: Self-pay

## 2020-03-09 DIAGNOSIS — Z0000 Encounter for general adult medical examination without abnormal findings: Secondary | ICD-10-CM

## 2020-03-09 NOTE — Addendum Note (Signed)
Addended by: Kelle Darting A on: 03/09/2020 07:26 AM   Modules accepted: Orders

## 2020-03-10 LAB — COMPREHENSIVE METABOLIC PANEL
AG Ratio: 1.3 (calc) (ref 1.0–2.5)
ALT: 12 U/L (ref 6–29)
AST: 16 U/L (ref 10–30)
Albumin: 3.6 g/dL (ref 3.6–5.1)
Alkaline phosphatase (APISO): 34 U/L (ref 31–125)
BUN: 10 mg/dL (ref 7–25)
CO2: 22 mmol/L (ref 20–32)
Calcium: 8.8 mg/dL (ref 8.6–10.2)
Chloride: 102 mmol/L (ref 98–110)
Creat: 0.55 mg/dL (ref 0.50–1.10)
Globulin: 2.7 g/dL (calc) (ref 1.9–3.7)
Glucose, Bld: 128 mg/dL — ABNORMAL HIGH (ref 65–99)
Potassium: 4 mmol/L (ref 3.5–5.3)
Sodium: 135 mmol/L (ref 135–146)
Total Bilirubin: 0.4 mg/dL (ref 0.2–1.2)
Total Protein: 6.3 g/dL (ref 6.1–8.1)

## 2020-03-10 LAB — LIPID PANEL
Cholesterol: 143 mg/dL (ref <200)
HDL: 61 mg/dL (ref >=50)
LDL Cholesterol (Calc): 64 mg/dL (calc)
Non-HDL Cholesterol (Calc): 82 mg/dL (calc) (ref <130)
Total CHOL/HDL Ratio: 2.3 (calc) (ref <5.0)
Triglycerides: 96 mg/dL (ref <150)

## 2020-03-10 LAB — CBC
HCT: 36.9 % (ref 35.0–45.0)
Hemoglobin: 12.5 g/dL (ref 11.7–15.5)
MCH: 30.9 pg (ref 27.0–33.0)
MCHC: 33.9 g/dL (ref 32.0–36.0)
MCV: 91.1 fL (ref 80.0–100.0)
MPV: 10.6 fL (ref 7.5–12.5)
Platelets: 278 10*3/uL (ref 140–400)
RBC: 4.05 10*6/uL (ref 3.80–5.10)
RDW: 12.1 % (ref 11.0–15.0)
WBC: 6.8 10*3/uL (ref 3.8–10.8)

## 2020-03-12 ENCOUNTER — Other Ambulatory Visit: Payer: Self-pay

## 2020-03-12 ENCOUNTER — Ambulatory Visit (INDEPENDENT_AMBULATORY_CARE_PROVIDER_SITE_OTHER): Payer: 59 | Admitting: Family Medicine

## 2020-03-12 VITALS — BP 101/61 | HR 102 | Wt 265.0 lb

## 2020-03-12 DIAGNOSIS — O0990 Supervision of high risk pregnancy, unspecified, unspecified trimester: Secondary | ICD-10-CM

## 2020-03-12 DIAGNOSIS — B009 Herpesviral infection, unspecified: Secondary | ICD-10-CM

## 2020-03-12 DIAGNOSIS — O24919 Unspecified diabetes mellitus in pregnancy, unspecified trimester: Secondary | ICD-10-CM | POA: Insufficient documentation

## 2020-03-12 DIAGNOSIS — Z7189 Other specified counseling: Secondary | ICD-10-CM

## 2020-03-12 DIAGNOSIS — I10 Essential (primary) hypertension: Secondary | ICD-10-CM

## 2020-03-12 DIAGNOSIS — Z86711 Personal history of pulmonary embolism: Secondary | ICD-10-CM

## 2020-03-12 MED ORDER — FREESTYLE LIBRE 14 DAY READER DEVI: 1.0000 | 0 refills | Status: DC

## 2020-03-12 MED ORDER — FREESTYLE LIBRE 14 DAY SENSOR MISC: 1.0000 | 3 refills | Status: DC

## 2020-03-12 NOTE — Progress Notes (Signed)
   PRENATAL VISIT NOTE  Subjective:  Danielle Garcia is a 29 y.o. G3P1011 at [redacted]w[redacted]d being seen today for ongoing prenatal care.  She is currently monitored for the following issues for this high-risk pregnancy and has HSV-2 (herpes simplex virus 2) infection; Other fatigue; Shortness of breath on exertion; Migraine; Essential hypertension; Back pain; Urinary frequency; Gastroesophageal reflux disease; Morbid obesity ([name]); Vitamin D deficiency; Supervision of high risk pregnancy, antepartum; Asthma; History of pulmonary embolism; Oral thrush; and Diabetes mellitus during pregnancy, antepartum on their problem list.  Patient reports no complaints.  Contractions: Not present. Vag. Bleeding: None.  Movement: Absent. Denies leaking of fluid.   The following portions of the patient's history were reviewed and updated as appropriate: allergies, current medications, past family history, past medical history, past social history, past surgical history and problem list.   Objective:   Vitals:   03/12/20 1107  BP: 101/61  Pulse: (!) 102  Weight: 265 lb (120.2 kg)    Fetal Status:     Movement: Absent     General:  Alert, oriented and cooperative. Patient is in no acute distress.  Skin: Skin is warm and dry. No rash noted.   Cardiovascular: Normal heart rate noted  Respiratory: Normal respiratory effort, no problems with respiration noted  Abdomen: Soft, gravid, appropriate for gestational age.  Pain/Pressure: Absent     Pelvic: Cervical exam deferred        Extremities: Normal range of motion.  Edema: None  Mental Status: Normal mood and affect. Normal behavior. Normal judgment and thought content.   Assessment and Plan:  Pregnancy: [id number] at [redacted]w[redacted]d 1. Supervision of high risk pregnancy, antepartum FHT normal   2. History of pulmonary embolism Start lovenox ppx (40mg ) next week  3. Morbid obesity ([name])  4. HSV-2 (herpes simplex virus 2) infection Valtrex at 35 weeks  5. Essential  hypertension BP controlled off meds Start ASA 81mg  Serial [place]  6. Diabetes mellitus during pregnancy, antepartum, unspecified diabetes mellitus type The patient's HgA1c 6.4. Additionally, had a fasting blood recently done by PCP and was elevated to 128. This would make her at least an A2/B diabetic. Start checking CBGs - will attempt to get her a CGM, which will help with compliance, especially at work.  7. Counseled about COVID-19 virus infection Patient previously counseled regarding COVID vaccine exemption and initially had decided to get the COVID vaccine in the second trimester. She would now like to think more on this. I discussed with her the data that COVID vaccine among pregnant patients have not shown an increase risk of miscarriage, preterm labor, preterm birth, fetal growth restriction, or fetal birth defects. I also counseled her regarding her particular risk factors: h/o PE, diabetes, HTN, obesity all increase her likelihood of severe disease with COVID. I discussed with her the current statistics, that the majority of patients admitted to the hospital, those with severe disease, and those who are intubated are unvaccinated. She would still like to defer the vaccine for now and that she may reconsider later on in the pregnancy.   Preterm labor symptoms and general obstetric precautions including but not limited to vaginal bleeding, contractions, leaking of fluid and fetal movement were reviewed in detail with the patient. Please refer to After Visit Summary for other counseling recommendations.   No follow-ups on file.  Future Appointments  Date Time Provider [place]  04/05/2020 11:00 AM Anyanwu, Sallyanne Havers, MD [facility] None    Truett Mainland, DO

## 2020-03-18 ENCOUNTER — Encounter: Payer: 59 | Admitting: Family Medicine

## 2020-03-18 ENCOUNTER — Other Ambulatory Visit: Payer: Self-pay | Admitting: Family Medicine

## 2020-03-18 MED ORDER — DEXCOM G6 SENSOR MISC: 1.0000 [IU] | 3 refills | Status: DC

## 2020-03-18 MED ORDER — DEXCOM G6 TRANSMITTER MISC: 1.0000 [IU] | 0 refills | Status: DC

## 2020-03-18 MED ORDER — DEXCOM G6 RECEIVER DEVI: 1.0000 [IU] | 0 refills | Status: DC

## 2020-03-18 MED FILL — DEXCOM G6 RECEIVER DEVI: 1 days supply | Qty: 1 | Fill #0

## 2020-03-18 MED FILL — DEXCOM G6 SENSOR MISC: 30 days supply | Qty: 3 | Fill #0

## 2020-03-18 MED FILL — DEXCOM G6 TRANSMITTER MISC: 90 days supply | Qty: 1 | Fill #0

## 2020-03-23 MED FILL — ENOXAPARIN SODIUM 40 MG/0.4: 40 | 12 days supply | Qty: 5 | Fill #1

## 2020-04-05 ENCOUNTER — Other Ambulatory Visit: Payer: Self-pay | Admitting: Family Medicine

## 2020-04-05 ENCOUNTER — Other Ambulatory Visit (HOSPITAL_BASED_OUTPATIENT_CLINIC_OR_DEPARTMENT_OTHER): Payer: Self-pay | Admitting: Obstetrics & Gynecology

## 2020-04-05 ENCOUNTER — Ambulatory Visit (INDEPENDENT_AMBULATORY_CARE_PROVIDER_SITE_OTHER): Payer: 59 | Admitting: Obstetrics & Gynecology

## 2020-04-05 ENCOUNTER — Ambulatory Visit: Payer: 59 | Attending: Internal Medicine

## 2020-04-05 ENCOUNTER — Other Ambulatory Visit: Payer: Self-pay

## 2020-04-05 VITALS — BP 110/76 | HR 92 | Wt 264.0 lb

## 2020-04-05 DIAGNOSIS — O0990 Supervision of high risk pregnancy, unspecified, unspecified trimester: Secondary | ICD-10-CM

## 2020-04-05 DIAGNOSIS — Z86711 Personal history of pulmonary embolism: Secondary | ICD-10-CM

## 2020-04-05 DIAGNOSIS — O24919 Unspecified diabetes mellitus in pregnancy, unspecified trimester: Secondary | ICD-10-CM

## 2020-04-05 DIAGNOSIS — Z3A14 14 weeks gestation of pregnancy: Secondary | ICD-10-CM

## 2020-04-05 DIAGNOSIS — Z23 Encounter for immunization: Secondary | ICD-10-CM

## 2020-04-05 DIAGNOSIS — O10919 Unspecified pre-existing hypertension complicating pregnancy, unspecified trimester: Secondary | ICD-10-CM

## 2020-04-05 MED ORDER — METFORMIN HCL 500 MG PO TABS: 500.0000 mg | ORAL_TABLET | Freq: Two times a day (BID) | ORAL | 3 refills | Status: DC

## 2020-04-05 MED ORDER — ASPIRIN EC 81 MG PO TBEC: 81.0000 mg | DELAYED_RELEASE_TABLET | Freq: Every day | ORAL | 11 refills | Status: DC

## 2020-04-05 MED FILL — ENOXAPARIN SODIUM 40 MG/0.4: 40 | 12 days supply | Qty: 5 | Fill #2

## 2020-04-05 MED FILL — METFORMIN HCL 500 MG TABS: 500 | 30 days supply | Qty: 60 | Fill #0

## 2020-04-05 MED FILL — ASPIRIN ADULT LOW STRENGTH: 81 | 30 days supply | Qty: 30 | Fill #0

## 2020-04-05 NOTE — Patient Instructions (Signed)

## 2020-04-05 NOTE — Progress Notes (Signed)
   PRENATAL VISIT NOTE  Subjective:  Danielle Garcia is a 29 y.o. G3P1011 at [redacted]w[redacted]d being seen today for ongoing prenatal care.  She is currently monitored for the following issues for this high-risk pregnancy and has HSV-2 (herpes simplex virus 2) infection; Other fatigue; Shortness of breath on exertion; Migraine; Essential hypertension; Back pain; Urinary frequency; Gastroesophageal reflux disease; Morbid obesity ([place]); Vitamin D deficiency; Supervision of high risk pregnancy, antepartum; Asthma; History of pulmonary embolism; Oral thrush; and Diabetes mellitus during pregnancy, antepartum on their problem list.  Patient reports no complaints.  Contractions: Not present. Vag. Bleeding: None.  Movement: Absent. Denies leaking of fluid.   The following portions of the patient's history were reviewed and updated as appropriate: allergies, current medications, past family history, past medical history, past social history, past surgical history and problem list.   Objective:   Vitals:   04/05/20 1102  BP: 110/76  Pulse: 92  Weight: 264 lb (119.7 kg)    Fetal Status: Fetal Heart Rate (bpm): 150   Movement: Absent     General:  Alert, oriented and cooperative. Patient is in no acute distress.  Skin: Skin is warm and dry. No rash noted.   Cardiovascular: Normal heart rate noted  Respiratory: Normal respiratory effort, no problems with respiration noted  Abdomen: Soft, gravid, appropriate for gestational age.  Pain/Pressure: Present     Pelvic: Cervical exam deferred        Extremities: Normal range of motion.  Edema: None  Mental Status: Normal mood and affect. Normal behavior. Normal judgment and thought content.   Assessment and Plan:  Pregnancy: G3P1011 at [redacted]w[redacted]d 1. Diabetes mellitus during pregnancy, antepartum, unspecified diabetes mellitus type Elevated BS across the board, fasting in 100-110s, postpradials 110-150s.  Started Metformin 500 mg po bid.  Has not had DM coordinator  meeting yet, this will be scheduled and they will go over diet and exercise. Continue to monitor.  - [place] MFM OB DETAIL +14 WK; Future - metFORMIN (GLUCOPHAGE) 500 MG tablet; Take 1 tablet (500 mg total) by mouth 2 (two) times daily with a meal.  Dispense: 60 tablet; Refill: 3 - aspirin EC 81 MG tablet; Take 1 tablet (81 mg total) by mouth daily. Swallow whole.  Dispense: 30 tablet; Refill: 11 - Referral to Nutrition and Diabetes Services  2. Preexisting hypertension complicating pregnancy, antepartum Stable BP. ASA prescribed for PEC prophylaxis.   3. History of postpartum pulmonary embolism Continue Lovenox  4. [redacted] weeks gestation of pregnancy 5. Supervision of high risk pregnancy, antepartum AFP only draw next visit. Low risk NIPS. Anatomy scan  No other complaints or concerns.  Routine obstetric precautions reviewed. Please refer to After Visit Summary for other counseling recommendations.   Return in about 2 weeks (around 04/19/2020) for OFFICE OB Visit.  Future Appointments  Date Time Provider [place]  04/22/2020  8:45 AM Truett Mainland, DO [facility] None  05/10/2020 10:45 AM [facility] NURSE [facility]  05/10/2020 11:00 AM [facility] US1 [facility] [place]    Verita Schneiders, MD

## 2020-04-05 NOTE — Progress Notes (Signed)
   Covid-19 Vaccination Clinic  Name:  Danielle Garcia    MRN: [id number] DOB: [birth year]  04/05/2020  [name] was observed post Covid-19 immunization for 15 minutes without incident. She was provided with Vaccine Information Sheet and instruction to access the V-Safe system. Vaccinated by Richard Miu.  [name] was instructed to call 911 with any severe reactions post vaccine: Marland Kitchen Difficulty breathing  . Swelling of face and throat  . A fast heartbeat  . A bad rash all over body  . Dizziness and weakness   Immunizations Administered    Name Date Dose VIS Date Route   Pfizer COVID-19 Vaccine 04/05/2020 11:46 AM 0.3 mL 09/10/2018 Intramuscular   Manufacturer: [employer]   Lot: [id number]   NDC: [id number]

## 2020-04-06 ENCOUNTER — Other Ambulatory Visit: Payer: Self-pay

## 2020-04-06 NOTE — Telephone Encounter (Signed)
Patient had to change out her sensor on her dexcom and is now requesting another device be called into her pharmacy. When she called the pharmacy they stated it was to early so she is requesting a refill to see if the pharmacy will go ahead and approve it. If not she will not be able to check her blood sugars until 04/11/2020.

## 2020-04-09 MED FILL — PFIZER-BIONTECH COVID-19 VA: 30 | 1 days supply | Qty: 0 | Fill #0

## 2020-04-12 MED FILL — DEXCOM G6 SENSOR MISC: 30 days supply | Qty: 3 | Fill #1

## 2020-04-16 MED FILL — ENOXAPARIN SODIUM 40 MG/0.4: 40 | 12 days supply | Qty: 5 | Fill #3

## 2020-04-19 ENCOUNTER — Other Ambulatory Visit: Payer: Self-pay | Admitting: Family Medicine

## 2020-04-19 MED ORDER — MOMETASONE FUROATE 50 MCG/ACT NA SUSP: 2.0000 | Freq: Every day | NASAL | 11 refills | Status: DC

## 2020-04-19 MED FILL — MOMETASONE FUROATE 50 MCG S: 50 | 60 days supply | Qty: 17 | Fill #0

## 2020-04-20 ENCOUNTER — Other Ambulatory Visit: Payer: Self-pay

## 2020-04-20 ENCOUNTER — Encounter (HOSPITAL_COMMUNITY): Payer: Self-pay | Admitting: Obstetrics and Gynecology

## 2020-04-20 ENCOUNTER — Inpatient Hospital Stay (HOSPITAL_COMMUNITY)
Admission: AD | Admit: 2020-04-20 | Discharge: 2020-04-20 | Disposition: A | Discharge status: Home / Self Care | Payer: 59 | Attending: Obstetrics and Gynecology | Admitting: Obstetrics and Gynecology

## 2020-04-20 ENCOUNTER — Telehealth: Payer: Self-pay

## 2020-04-20 ENCOUNTER — Other Ambulatory Visit (HOSPITAL_COMMUNITY): Payer: Self-pay | Admitting: Student

## 2020-04-20 DIAGNOSIS — Z79899 Other long term (current) drug therapy: Secondary | ICD-10-CM | POA: Diagnosis not present

## 2020-04-20 DIAGNOSIS — O10012 Pre-existing essential hypertension complicating pregnancy, second trimester: Secondary | ICD-10-CM | POA: Insufficient documentation

## 2020-04-20 DIAGNOSIS — O99612 Diseases of the digestive system complicating pregnancy, second trimester: Secondary | ICD-10-CM | POA: Insufficient documentation

## 2020-04-20 DIAGNOSIS — O26892 Other specified pregnancy related conditions, second trimester: Secondary | ICD-10-CM | POA: Diagnosis not present

## 2020-04-20 DIAGNOSIS — Z7982 Long term (current) use of aspirin: Secondary | ICD-10-CM | POA: Insufficient documentation

## 2020-04-20 DIAGNOSIS — O24112 Pre-existing diabetes mellitus, type 2, in pregnancy, second trimester: Secondary | ICD-10-CM | POA: Diagnosis not present

## 2020-04-20 DIAGNOSIS — Z3A18 18 weeks gestation of pregnancy: Secondary | ICD-10-CM | POA: Insufficient documentation

## 2020-04-20 DIAGNOSIS — Z7984 Long term (current) use of oral hypoglycemic drugs: Secondary | ICD-10-CM | POA: Diagnosis not present

## 2020-04-20 DIAGNOSIS — E119 Type 2 diabetes mellitus without complications: Secondary | ICD-10-CM | POA: Insufficient documentation

## 2020-04-20 DIAGNOSIS — Z7901 Long term (current) use of anticoagulants: Secondary | ICD-10-CM | POA: Insufficient documentation

## 2020-04-20 DIAGNOSIS — K219 Gastro-esophageal reflux disease without esophagitis: Secondary | ICD-10-CM | POA: Diagnosis not present

## 2020-04-20 DIAGNOSIS — Z3A17 17 weeks gestation of pregnancy: Secondary | ICD-10-CM

## 2020-04-20 DIAGNOSIS — R102 Pelvic and perineal pain: Secondary | ICD-10-CM | POA: Diagnosis not present

## 2020-04-20 DIAGNOSIS — Z86711 Personal history of pulmonary embolism: Secondary | ICD-10-CM | POA: Diagnosis not present

## 2020-04-20 HISTORY — DX: Other pulmonary embolism without acute cor pulmonale: I26.99

## 2020-04-20 HISTORY — DX: Benign neoplasm of connective and other soft tissue, unspecified: D21.9

## 2020-04-20 HISTORY — DX: Type 2 diabetes mellitus without complications: E11.9

## 2020-04-20 HISTORY — DX: Unspecified infectious disease: B99.9

## 2020-04-20 LAB — URINALYSIS, ROUTINE W REFLEX MICROSCOPIC
Bilirubin Urine: NEGATIVE
Glucose, UA: 150 mg/dL — AB
Hgb urine dipstick: NEGATIVE
Ketones, ur: 5 mg/dL — AB
Leukocytes,Ua: NEGATIVE
Nitrite: NEGATIVE
Protein, ur: 30 mg/dL — AB
Specific Gravity, Urine: 1.031 — ABNORMAL HIGH (ref 1.005–1.030)
pH: 6 (ref 5.0–8.0)

## 2020-04-20 LAB — WET PREP, GENITAL
Clue Cells Wet Prep HPF POC: NONE SEEN
Sperm: NONE SEEN
Trich, Wet Prep: NONE SEEN
Yeast Wet Prep HPF POC: NONE SEEN

## 2020-04-20 MED ORDER — CYCLOBENZAPRINE HCL 5 MG PO TABS
10.0000 mg | ORAL_TABLET | Freq: Once | ORAL | Status: DC
  Filled 2020-04-20: qty 2

## 2020-04-20 MED ORDER — ACETAMINOPHEN 325 MG PO TABS
650.0000 mg | ORAL_TABLET | Freq: Once | ORAL | Status: AC
  Administered 2020-04-20: 650 mg via ORAL
  Filled 2020-04-20: qty 2

## 2020-04-20 MED ORDER — CYCLOBENZAPRINE HCL 10 MG PO TABS
10.0000 mg | ORAL_TABLET | Freq: Two times a day (BID) | ORAL | 0 refills | Status: DC | PRN
Start: 2020-04-20 — End: 2020-09-10

## 2020-04-20 MED FILL — CYCLOBENZAPRINE HCL 10 MG T: 10 | 10 days supply | Qty: 20 | Fill #0

## 2020-04-20 NOTE — MAU Note (Signed)
Since yesterday has been having some very intense cramps, they are a '10'.  No bleeding, some d/c. Had called dr and was told to come in. Little nausea, no other GI or GU complaints.  Feels a heaviness down there.

## 2020-04-20 NOTE — Telephone Encounter (Signed)
Pt called stating she has been cramping since yesterday and is worried that something is wrong. Pt denies vaginal bleeding but has noticed an increase in vaginal discharge. Pt states she has tried increasing her fluids. A message was sent to the provider. Advised pt to go to [facility].

## 2020-04-20 NOTE — MAU Provider Note (Addendum)
Patient Danielle Garcia is a 29 y.o. G3P1011  at [redacted]w[redacted]d here with complaints of suprapubic pain that sometimes radiates to her back. She denies vaginal bleeding, LOF, abnormal discharge, burning with urination, contractions.   She has a history of c/section, PP Pulmonary embolism, and had HgbA1c of 6.4. Treated as GDMA2. She also has essential hypertension on her problem list, is taking BASA. History     CSN: [id number]  Arrival date and time: 04/20/20 6270   First Provider Initiated Contact with Patient 04/20/20 1100      Chief Complaint  Patient presents with  . Abdominal Pain   Abdominal Pain This is a new problem. The current episode started yesterday. The onset quality is sudden. The problem has been unchanged. The pain is located in the suprapubic region. The pain is at a severity of 10/10. The quality of the pain is cramping. The abdominal pain radiates to the back. Pertinent negatives include no constipation, diarrhea, dysuria, fever, nausea or vomiting. The pain is aggravated by certain positions. Relieved by: lying on her side.    OB History    Gravida  3   Para  1   Term  1   Preterm      AB  1   Living  1     SAB  1   TAB      Ectopic      Multiple      Live Births  1        Obstetric Comments  9#, "wouldn't go into birth canal"        Past Medical History:  Diagnosis Date  . Anxiety   . Asthma   . Depression    doing ok now  . Diabetes mellitus without complication ([place])   . Fibroid   . Floaters   . GERD (gastroesophageal reflux disease)   . HTN (hypertension)   . Infection    UTI  . Migraine   . Pre-diabetes   . Pulmonary embolus ([place])    after first delivery    Past Surgical History:  Procedure Laterality Date  . CESAREAN SECTION    . DILATION AND EVACUATION N/A 07/02/2018   Procedure: DILATATION AND EVACUATION;  Surgeon: Aletha Halim, MD;  Location: [facility];  Service: Gynecology;  Laterality: N/A;  . WISDOM TOOTH  EXTRACTION      Family History  Problem Relation Age of Onset  . Hypertension Mother   . Heart attack Father 53  . Obesity Father   . Hypertension Maternal Grandmother   . Diabetes Maternal Grandmother   . Lung cancer Maternal Grandfather   . Diabetes Paternal Grandmother   . Allergies Daughter     Social History   Tobacco Use  . Smoking status: Never Smoker  . Smokeless tobacco: Never Used  Vaping Use  . Vaping Use: Never used  Substance Use Topics  . Alcohol use: Not Currently    Comment: occasionally  . Drug use: No    Allergies: No Known Allergies  Medications Prior to Admission  Medication Sig Dispense Refill Last Dose  . aspirin EC 81 MG tablet Take 1 tablet (81 mg total) by mouth daily. Swallow whole. 30 tablet 11 04/20/2020 at Unknown time  . cetirizine (ZYRTEC) 10 MG tablet Take 10 mg by mouth daily.   04/19/2020 at Unknown time  . Doxylamine-Pyridoxine (DICLEGIS) 10-10 MG TBEC Take 2 tablets by mouth at bedtime as needed. 60 tablet 5 Past Month at Unknown time  . enoxaparin (LOVENOX)  40 MG/0.4ML injection Inject 0.4 mLs (40 mg total) into the skin daily. 12 mL 5 04/20/2020 at Unknown time  . metFORMIN (GLUCOPHAGE) 500 MG tablet Take 1 tablet (500 mg total) by mouth 2 (two) times daily with a meal. 60 tablet 3 04/20/2020 at Unknown time  . Prenatal Vit-Fe Fumarate-FA (PRENATAL VITAMIN) 27-0.8 MG TABS Take 1 tablet by mouth daily. 30 tablet  04/20/2020 at Unknown time  . Continuous Blood Gluc Receiver (DEXCOM G6 RECEIVER) DEVI 1 Units by Does not apply route See admin instructions. Check CBGs 4 times daily 1 each 0   . Continuous Blood Gluc Sensor (DEXCOM G6 SENSOR) MISC 1 Units by Does not apply route See admin instructions. Change every 10 days 9 each 3   . Continuous Blood Gluc Transmit (DEXCOM G6 TRANSMITTER) MISC 1 Units by Does not apply route as directed. 1 each 0   . levocetirizine (XYZAL) 5 MG tablet      . mometasone (NASONEX) 50 MCG/ACT nasal spray Place 2  sprays into the nose daily.     . mometasone (NASONEX) 50 MCG/ACT nasal spray Place 2 sprays into the nose daily. 1 each 11   . mometasone (NASONEX) 50 MCG/ACT nasal spray Place 2 sprays into the nose daily. 1 each 11   . nystatin (MYCOSTATIN) 100000 UNIT/ML suspension Take 5 mLs (500,000 Units total) by mouth 4 (four) times daily. 60 mL 0   . ondansetron (ZOFRAN ODT) 4 MG disintegrating tablet Take 1 tablet (4 mg total) by mouth every 6 (six) hours as needed for nausea. 20 tablet 0   . pantoprazole (PROTONIX) 40 MG tablet Take 1 tablet (40 mg total) by mouth daily. 30 tablet 3   . valACYclovir (VALTREX) 1000 MG tablet Take 1 tab daily for 5 days during outbreaks. 30 tablet 1 More than a month at Unknown time    Review of Systems  Constitutional: Negative for fever.  Gastrointestinal: Positive for abdominal pain. Negative for constipation, diarrhea, nausea and vomiting.  Genitourinary: Negative for dysuria.   Physical Exam   Blood pressure 117/75, pulse 87, temperature 98.1 F (36.7 C), resp. rate 18, height 5\' 3"  (1.6 m), weight 121.1 kg, last menstrual period 12/23/2019, SpO2 100 %, unknown if currently breastfeeding.  Physical Exam Constitutional:      Appearance: She is well-developed.  HENT:     Head: Normocephalic.  Abdominal:     General: Abdomen is flat. Bowel sounds are normal.     Palpations: Abdomen is soft.     Tenderness: There is no abdominal tenderness. There is no right CVA tenderness, left CVA tenderness or guarding. Negative signs include Murphy's sign.  Genitourinary:    Vagina: Normal.  Neurological:     Mental Status: She is alert.   Cervix was long, closed, thick, no CMT, suprapubic or adnexal tenderness.   MAU Course  Procedures  MDM -FHR was 148 -Wet prep negative -Patient's urine shows glucosuria -will try tylenol and heating pad> patient feels much better. Patient declined flexeril in MAU because she has to work but she asks if she can have an RX  for home.  Assessment and Plan   1. 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  -Discussed diet, weight gain and pregnancy maternity support belt.  -Reviewed importance of glycemic control -GC pending -Keep next OB appointment on 10/25 -RX for Flexeril given Mervyn Skeeters Karynn Deblasi 04/20/2020, 11:00 AM

## 2020-04-20 NOTE — Telephone Encounter (Signed)
Yes - okay to send to [facility].

## 2020-04-20 NOTE — Discharge Instructions (Signed)
Abdominal Pain During Pregnancy  Abdominal pain is common during pregnancy, and has many possible causes. Some causes are more serious than others, and sometimes the cause is not known. Abdominal pain can be a sign that labor is starting. It can also be caused by normal growth and stretching of muscles and ligaments during pregnancy. Always tell your health care provider if you have any abdominal pain. Follow these instructions at home:  Do not have sex or put anything in your vagina until your pain goes away completely.  Get plenty of rest until your pain improves.  Drink enough fluid to keep your urine pale yellow.  Take over-the-counter and prescription medicines only as told by your health care provider.  Keep all follow-up visits as told by your health care provider. This is important. Contact a health care provider if:  Your pain continues or gets worse after resting.  You have lower abdominal pain that: ? Comes and goes at regular intervals. ? Spreads to your back. ? Is similar to menstrual cramps.  You have pain or burning when you urinate. Get help right away if:  You have a fever or chills.  You have vaginal bleeding.  You are leaking fluid from your vagina.  You are passing tissue from your vagina.  You have vomiting or diarrhea that lasts for more than 24 hours.  Your baby is moving less than usual.  You feel very weak or faint.  You have shortness of breath.  You develop severe pain in your upper abdomen. Summary  Abdominal pain is common during pregnancy, and has many possible causes.  If you experience abdominal pain during pregnancy, tell your health care provider right away.  Follow your health care provider's home care instructions and keep all follow-up visits as directed. This information is not intended to replace advice given to you by your health care provider. Make sure you discuss any questions you have with your health care  provider. Document Revised: 10/21/2018 Document Reviewed: 10/05/2016 Elsevier Patient Education  2020 [employer]  

## 2020-04-21 LAB — GC/CHLAMYDIA PROBE AMP (~~LOC~~) NOT AT ARMC
Chlamydia: NEGATIVE
Comment: NEGATIVE
Comment: NORMAL
Neisseria Gonorrhea: NEGATIVE

## 2020-04-22 ENCOUNTER — Encounter: Payer: 59 | Admitting: Family Medicine

## 2020-04-26 ENCOUNTER — Ambulatory Visit: Payer: Self-pay

## 2020-04-27 ENCOUNTER — Other Ambulatory Visit: Payer: Self-pay

## 2020-04-27 ENCOUNTER — Ambulatory Visit (INDEPENDENT_AMBULATORY_CARE_PROVIDER_SITE_OTHER): Payer: 59 | Admitting: Advanced Practice Midwife

## 2020-04-27 ENCOUNTER — Ambulatory Visit: Payer: 59 | Attending: Internal Medicine

## 2020-04-27 ENCOUNTER — Other Ambulatory Visit (HOSPITAL_BASED_OUTPATIENT_CLINIC_OR_DEPARTMENT_OTHER): Payer: Self-pay | Admitting: Internal Medicine

## 2020-04-27 VITALS — BP 131/88 | HR 100 | Wt 264.0 lb

## 2020-04-27 DIAGNOSIS — Z3A18 18 weeks gestation of pregnancy: Secondary | ICD-10-CM

## 2020-04-27 DIAGNOSIS — Z23 Encounter for immunization: Secondary | ICD-10-CM

## 2020-04-27 DIAGNOSIS — O24419 Gestational diabetes mellitus in pregnancy, unspecified control: Secondary | ICD-10-CM

## 2020-04-27 DIAGNOSIS — Z7689 Persons encountering health services in other specified circumstances: Secondary | ICD-10-CM | POA: Diagnosis not present

## 2020-04-27 DIAGNOSIS — O09892 Supervision of other high risk pregnancies, second trimester: Secondary | ICD-10-CM | POA: Diagnosis not present

## 2020-04-27 DIAGNOSIS — Z86711 Personal history of pulmonary embolism: Secondary | ICD-10-CM

## 2020-04-27 DIAGNOSIS — O10919 Unspecified pre-existing hypertension complicating pregnancy, unspecified trimester: Secondary | ICD-10-CM

## 2020-04-27 LAB — POCT URINALYSIS DIPSTICK
Blood, UA: NEGATIVE
Glucose, UA: NEGATIVE
Ketones, UA: POSITIVE
Leukocytes, UA: NEGATIVE
Nitrite, UA: NEGATIVE
Protein, UA: NEGATIVE
Spec Grav, UA: 1.015 (ref 1.010–1.025)
pH, UA: 7 (ref 5.0–8.0)

## 2020-04-27 MED FILL — ENOXAPARIN SODIUM 40 MG/0.4: 40 | 12 days supply | Qty: 5 | Fill #4

## 2020-04-27 NOTE — Patient Instructions (Signed)

## 2020-04-27 NOTE — Progress Notes (Signed)
° °  PRENATAL VISIT NOTE  Subjective:  Danielle Garcia is a 29 y.o. G3P1011 at [redacted]w[redacted]d being seen today for ongoing prenatal care.  She is currently monitored for the following issues for this high-risk pregnancy and has HSV-2 (herpes simplex virus 2) infection; Other fatigue; Shortness of breath on exertion; Migraine; Essential hypertension; Back pain; Urinary frequency; Gastroesophageal reflux disease; Morbid obesity ([place]); Vitamin D deficiency; Supervision of high risk pregnancy, antepartum; Asthma; History of pulmonary embolism; Oral thrush; and Diabetes mellitus during pregnancy, antepartum on their problem list.  Patient reports no complaints.  Contractions: Not present. Vag. Bleeding: None.  Movement: Present. Denies leaking of fluid.   The following portions of the patient's history were reviewed and updated as appropriate: allergies, current medications, past family history, past medical history, past social history, past surgical history and problem list.   Objective:   Vitals:   04/27/20 1049 04/27/20 1106  BP: (!) 119/93 131/88  Pulse: 98 100  Weight: 264 lb (119.7 kg)     Fetal Status: Fetal Heart Rate (bpm): 152   Movement: Present     General:  Alert, oriented and cooperative. Patient is in no acute distress.  Skin: Skin is warm and dry. No rash noted.   Cardiovascular: Normal heart rate noted  Respiratory: Normal respiratory effort, no problems with respiration noted  Abdomen: Soft, gravid, appropriate for gestational age.  Pain/Pressure: Present     Pelvic: Cervical exam deferred        Extremities: Normal range of motion.  Edema: None  Mental Status: Normal mood and affect. Normal behavior. Normal judgment and thought content.   Assessment and Plan:  Pregnancy: G3P1011 at [redacted]w[redacted]d 1. [redacted] weeks gestation of pregnancy [place] scheduled tomorrow - AFP, Serum, Open Spina Bifida - POCT urinalysis dipstick - Genetic Screening  2. Gestational diabetes mellitus (GDM), antepartum,  gestational diabetes method of control unspecified Fasting blood sugars mostly in low 100s Postprandial BS with most normal but a few in 130s Discussed increasing her PM Metformin to 1000mg , keeping morning dose at 500mg   - POCT urinalysis dipstick - Genetic Screening  3. History of postpartum pulmonary embolism On baby ASA  And Lovenox for prophylaxis  4. Preexisting hypertension complicating pregnancy, antepartum On Baby ASA 131/88 today   Preterm labor symptoms and general obstetric precautions including but not limited to vaginal bleeding, contractions, leaking of fluid and fetal movement were reviewed in detail with the patient. Please refer to After Visit Summary for other counseling recommendations.   Return in about 4 weeks (around 05/25/2020), or MD visit ntext time, for [facility].  Future Appointments  Date Time Provider [place]  05/10/2020  9:00 AM Barrie Folk, LCSW [facility] None  05/10/2020 12:45 PM [facility] NURSE [facility]  05/10/2020  1:00 PM [facility] US1 [facility]  05/21/2020  8:00 AM Christella Hartigan, RD [place] [facility]  05/27/2020  8:45 AM Truett Mainland, DO [facility] None    [name], CNM

## 2020-04-27 NOTE — Progress Notes (Signed)
   Covid-19 Vaccination Clinic  Name:  Collene Massimino    MRN: [id number] DOB: 1991-02-11  04/27/2020  Ms. Pacitti was observed post Covid-19 immunization for 15 minutes without incident. She was provided with Vaccine Information Sheet and instruction to access the V-Safe system. Vaccinated by [employer].  Ms. Gravelle was instructed to call 911 with any severe reactions post vaccine: Marland Kitchen Difficulty breathing  . Swelling of face and throat  . A fast heartbeat  . A bad rash all over body  . Dizziness and weakness   Immunizations Administered    Name Date Dose VIS Date [place] COVID-19 Vaccine 04/27/2020 12:20 PM 0.3 mL 09/10/2018 Intramuscular   Manufacturer: [place]   Lot: [place]: [id number]

## 2020-04-29 LAB — AFP, SERUM, OPEN SPINA BIFIDA
AFP MoM: 2
AFP Value: 69.4 ng/mL
Gest. Age on Collection Date: 18 weeks
Maternal Age At EDD: 30.5 yr
OSBR Risk 1 IN: 1611
Test Results:: NEGATIVE
Weight: 264 [lb_av]

## 2020-05-05 MED FILL — PFIZER-BIONTECH COVID-19 VA: 30 | 1 days supply | Qty: 0 | Fill #0

## 2020-05-10 ENCOUNTER — Ambulatory Visit: Payer: 59

## 2020-05-10 ENCOUNTER — Other Ambulatory Visit: Payer: Self-pay

## 2020-05-10 ENCOUNTER — Encounter: Payer: Self-pay | Admitting: *Deleted

## 2020-05-10 ENCOUNTER — Ambulatory Visit: Payer: 59 | Admitting: *Deleted

## 2020-05-10 ENCOUNTER — Ambulatory Visit: Payer: 59 | Attending: Obstetrics & Gynecology

## 2020-05-10 ENCOUNTER — Ambulatory Visit (INDEPENDENT_AMBULATORY_CARE_PROVIDER_SITE_OTHER): Payer: 59 | Admitting: Psychology

## 2020-05-10 VITALS — BP 122/67 | HR 90

## 2020-05-10 DIAGNOSIS — Z3A14 14 weeks gestation of pregnancy: Secondary | ICD-10-CM | POA: Insufficient documentation

## 2020-05-10 DIAGNOSIS — Z86711 Personal history of pulmonary embolism: Secondary | ICD-10-CM | POA: Diagnosis not present

## 2020-05-10 DIAGNOSIS — F4323 Adjustment disorder with mixed anxiety and depressed mood: Secondary | ICD-10-CM

## 2020-05-10 DIAGNOSIS — O0990 Supervision of high risk pregnancy, unspecified, unspecified trimester: Secondary | ICD-10-CM | POA: Diagnosis not present

## 2020-05-10 DIAGNOSIS — O24919 Unspecified diabetes mellitus in pregnancy, unspecified trimester: Secondary | ICD-10-CM | POA: Insufficient documentation

## 2020-05-10 DIAGNOSIS — O24415 Gestational diabetes mellitus in pregnancy, controlled by oral hypoglycemic drugs: Secondary | ICD-10-CM

## 2020-05-10 DIAGNOSIS — O10919 Unspecified pre-existing hypertension complicating pregnancy, unspecified trimester: Secondary | ICD-10-CM | POA: Insufficient documentation

## 2020-05-11 ENCOUNTER — Other Ambulatory Visit: Payer: Self-pay | Admitting: *Deleted

## 2020-05-11 ENCOUNTER — Other Ambulatory Visit: Payer: Self-pay | Admitting: Family Medicine

## 2020-05-11 DIAGNOSIS — O24912 Unspecified diabetes mellitus in pregnancy, second trimester: Secondary | ICD-10-CM

## 2020-05-13 ENCOUNTER — Other Ambulatory Visit: Payer: Self-pay | Admitting: Family Medicine

## 2020-05-13 MED FILL — DEXCOM G6 SENSOR MISC: 30 days supply | Qty: 3 | Fill #2

## 2020-05-13 MED FILL — ENOXAPARIN SODIUM 40 MG/0.4: 40 | 12 days supply | Qty: 5 | Fill #5

## 2020-05-13 MED FILL — METFORMIN HCL 500 MG TABS: 500 | 30 days supply | Qty: 60 | Fill #1

## 2020-05-21 ENCOUNTER — Ambulatory Visit (INDEPENDENT_AMBULATORY_CARE_PROVIDER_SITE_OTHER): Payer: 59 | Admitting: Psychology

## 2020-05-21 ENCOUNTER — Other Ambulatory Visit: Payer: Self-pay

## 2020-05-21 ENCOUNTER — Encounter: Payer: Self-pay | Admitting: Registered"

## 2020-05-21 ENCOUNTER — Encounter: Payer: 59 | Attending: Obstetrics & Gynecology | Admitting: Registered"

## 2020-05-21 DIAGNOSIS — O24319 Unspecified pre-existing diabetes mellitus in pregnancy, unspecified trimester: Secondary | ICD-10-CM | POA: Diagnosis not present

## 2020-05-21 DIAGNOSIS — O24419 Gestational diabetes mellitus in pregnancy, unspecified control: Secondary | ICD-10-CM | POA: Diagnosis not present

## 2020-05-21 DIAGNOSIS — O24919 Unspecified diabetes mellitus in pregnancy, unspecified trimester: Secondary | ICD-10-CM | POA: Insufficient documentation

## 2020-05-21 DIAGNOSIS — F4323 Adjustment disorder with mixed anxiety and depressed mood: Secondary | ICD-10-CM | POA: Diagnosis not present

## 2020-05-21 DIAGNOSIS — Z3A21 21 weeks gestation of pregnancy: Secondary | ICD-10-CM | POA: Diagnosis not present

## 2020-05-21 NOTE — Patient Instructions (Addendum)
Metformin: Consider putting a metfomin bottle near something that you do every night to help remind you to take evening dose.  CGM: Consider accepting invitation to share data with me so I can help provide specific guidance based on your patterns. To address sensor signal loss, with next sensor application try a location that you are less likely to put pressure on when you are sleeping.  Sleep: consider some of the sleep tips to help you get more sleep and better quality sleep.  Use the handouts and ideas discussed today to get more balance in your meals and fewer carbohydrates.

## 2020-05-21 NOTE — Progress Notes (Signed)
Diabetes Self-Management Education  Visit Type: First/Initial  Appt. Start Time: 0805 Appt. End Time: 0910  05/21/2020  Danielle Garcia, identified by name and date of birth, is a 29 y.o. female with a diagnosis of Diabetes: Gestational Diabetes.   ASSESSMENT  Last menstrual period 12/23/2019, unknown if currently breastfeeding. There is no height or weight on file to calculate BMI.   A1c 2021 labs: March 5.6% in August 6.4%  Pt is using Dexcom CGM. Pt denies hypoglyemia, states 88 mg/dL is low for her. Pt reports her MD goal for fasting is 90 mg/dL and 2 hrs post meal <120 mg/dL. Pt states she feels pretty good about her breakfast and lunch numbers (117-125 mg/dL), but is having harding time with dinner (135-145 mg/dL) picking up something quick and easy because it is late getting home after work and picking up daughter from daycare  Pt states she keeps phone (receiver) close to her most of the time but the Dexcom has some signal loss, mostly at night. Pt states she wears sensor on arm and she may be laying on it which may temporarily affect function. Pt has not started new transmitter yet so the bluetooth searching for old sensor would not be causing the loss of signal.  Pt states she started Metformin recently but sometimes only takes 500 am because she forgets in the evening. Pt states she was told she has pre-diabetes. RD discussed potential need for insulin in 3rd trimester and options available.  Pt states before becoming pregnant she was in the process of losing weight at [facility], doesn't remember exactly how long ago, may a year. Pt states she was told she is insulin resistant and she was started metformin but didn't continue.  Sleep 10:30-11:30 wakes up at 5 am. Often has a hard time going to sleep, gets up often during the night to urinate. Pt reports she has had issue evaluated by PCP prior to pregnancy and nothing was discovered.   Pt reports that  at work they often provide lunch and have healthy options and pt states she makes pretty good choices for lunch. Pt states she eats lunch early so she is hungry for snacks in the afternoon. Pt is more concerned about breakfast and dinner since she is pressed for time and the convenience choices are often high in carbohydrates.  After RD has access to patients Dexcom data will contact for follow-up appointment to give more specific advice about how she might change her diet or timing of meals to bring blood sugar into range.    Diabetes Self-Management Education - 05/21/20 0816      Visit Information   Visit Type First/Initial      Initial Visit   Diabetes Type Gestational Diabetes    Date Diagnosed not sure      Health Coping   How would you rate your overall health? Fair      Psychosocial Assessment   Patient Belief/Attitude about Diabetes Other (comment)   doesn't know   How often do you need to have someone help you when you read instructions, pamphlets, or other written materials from your doctor or pharmacy? 1 - Never    What is the last grade level you completed in school? college      Complications   Last HgB A1C per patient/outside source 6.4 %    How often do you check your blood sugar? --   CGM   Fasting Blood glucose range (mg/dL) 70-129  117-125   Postprandial Blood glucose range (mg/dL) 130-179;70-129    Have you had a dilated eye exam in the past 12 months? Yes    Have you had a dental exam in the past 12 months? Yes    Are you checking your feet? Yes    How many days per week are you checking your feet? 7      Dietary Intake   Breakfast chicken minis from [employer] with 1/2 bread    Snack (morning) none    Lunch salad OR fried chicken sandwiches OR lean cuisine    Snack (afternoon) cooki OR pb crackers OR chewy bars OR fruit    Dinner fried chicken, mac & cheese, greens   7:30-8:30 p.m.   Snack (evening) none    Beverage(s) water, soda for the burp, sugar free  minute made, coffee 1-2x week      Exercise   Exercise Type ADL's      Patient Education   Previous Diabetes Education No    Preconception care Pregnancy and GDM  Role of pre-pregnancy blood glucose control on the development of the fetus;Reviewed with patient blood glucose goals with pregnancy      Outcomes   Expected Outcomes Demonstrated interest in learning. Expect positive outcomes    Future DMSE PRN    Program Status Completed           Individualized Plan for Diabetes Self-Management Training:   Learning Objective:  Patient will have a greater understanding of diabetes self-management. Patient education plan is to attend individual and/or group sessions per assessed needs and concerns.   Patient Instructions  Metformin: Consider putting a metfomin bottle near something that you do every night to help remind you to take evening dose.  CGM: Consider accepting invitation to share data with me so I can help provide specific guidance based on your patterns. To address sensor signal loss, with next sensor application try a location that you are less likely to put pressure on when you are sleeping.  Sleep: consider some of the sleep tips to help you get more sleep and better quality sleep.  Use the handouts and ideas discussed today to get more balance in your meals and fewer carbohydrates.    Expected Outcomes:  Demonstrated interest in learning. Expect positive outcomes  Education material provided: Snack sheet , pre-existing diabetes in pregnancy,   If problems or questions, patient to contact team via:  Phone and MyChart  Future DSME appointment: PRN

## 2020-05-27 ENCOUNTER — Other Ambulatory Visit: Payer: Self-pay | Admitting: Family Medicine

## 2020-05-27 ENCOUNTER — Ambulatory Visit (INDEPENDENT_AMBULATORY_CARE_PROVIDER_SITE_OTHER): Payer: 59 | Admitting: Family Medicine

## 2020-05-27 ENCOUNTER — Other Ambulatory Visit: Payer: Self-pay

## 2020-05-27 VITALS — BP 115/51 | HR 93 | Wt 269.0 lb

## 2020-05-27 DIAGNOSIS — O24119 Pre-existing diabetes mellitus, type 2, in pregnancy, unspecified trimester: Secondary | ICD-10-CM

## 2020-05-27 DIAGNOSIS — O0990 Supervision of high risk pregnancy, unspecified, unspecified trimester: Secondary | ICD-10-CM

## 2020-05-27 DIAGNOSIS — B009 Herpesviral infection, unspecified: Secondary | ICD-10-CM

## 2020-05-27 DIAGNOSIS — I10 Essential (primary) hypertension: Secondary | ICD-10-CM

## 2020-05-27 DIAGNOSIS — Z86711 Personal history of pulmonary embolism: Secondary | ICD-10-CM

## 2020-05-27 MED ORDER — ENOXAPARIN SODIUM 120 MG/0.8ML ~~LOC~~ SOLN: 120.0000 mg | SUBCUTANEOUS | 3 refills | Status: DC

## 2020-05-27 NOTE — Progress Notes (Signed)
   PRENATAL VISIT NOTE  Subjective:  Danielle Garcia is a 29 y.o. G3P1011 at [redacted]w[redacted]d being seen today for ongoing prenatal care.  She is currently monitored for the following issues for this high-risk pregnancy and has HSV-2 (herpes simplex virus 2) infection; Other fatigue; Shortness of breath on exertion; Migraine; Essential hypertension; Back pain; Urinary frequency; Gastroesophageal reflux disease; Morbid obesity ([place]); Vitamin D deficiency; Supervision of high risk pregnancy, antepartum; Asthma; History of pulmonary embolism; Oral thrush; and Diabetes mellitus during pregnancy, antepartum on their problem list.  Patient reports no complaints.  Contractions: Not present. Vag. Bleeding: None.  Movement: Present. Denies leaking of fluid.   The following portions of the patient's history were reviewed and updated as appropriate: allergies, current medications, past family history, past medical history, past social history, past surgical history and problem list.   Objective:   Vitals:   05/27/20 0833  BP: (!) 115/51  Pulse: 93  Weight: 269 lb (122 kg)    Fetal Status: Fetal Heart Rate (bpm): 155   Movement: Present     General:  Alert, oriented and cooperative. Patient is in no acute distress.  Skin: Skin is warm and dry. No rash noted.   Cardiovascular: Normal heart rate noted  Respiratory: Normal respiratory effort, no problems with respiration noted  Abdomen: Soft, gravid, appropriate for gestational age.  Pain/Pressure: Present     Pelvic: Cervical exam deferred        Extremities: Normal range of motion.  Edema: None  Mental Status: Normal mood and affect. Normal behavior. Normal judgment and thought content.   Assessment and Plan:  Pregnancy: G3P1011 at [redacted]w[redacted]d 1. Supervision of high risk pregnancy, antepartum FHT normal  2. Pre-existing type 2 diabetes mellitus during pregnancy, antepartum Fasting CBGs elevated. Hasn't switched to metformin 1000mg  at bedtime yet. Reaffirmed  that needs to take it at night. If fastings still elevated, may need to start NPH or levemir at night  3. Essential hypertension BP normal Growth [place] on 11/29  4. History of pulmonary embolism Lovenox 1mg /kg given  5. HSV-2 (herpes simplex virus 2) infection PPx at 34-35 weeks  Preterm labor symptoms and general obstetric precautions including but not limited to vaginal bleeding, contractions, leaking of fluid and fetal movement were reviewed in detail with the patient. Please refer to After Visit Summary for other counseling recommendations.   Return in about 2 weeks (around 06/10/2020) for OB f/u.  Future Appointments  Date Time Provider [place]  06/14/2020  9:00 AM [facility] NURSE [facility]  06/14/2020  9:15 AM [facility] US2 [facility]  06/14/2020  3:00 PM Bauert, Terri W, LCSW [facility] None    Truett Mainland, DO

## 2020-06-02 ENCOUNTER — Other Ambulatory Visit: Payer: Self-pay

## 2020-06-02 DIAGNOSIS — O24919 Unspecified diabetes mellitus in pregnancy, unspecified trimester: Secondary | ICD-10-CM

## 2020-06-02 MED ORDER — METFORMIN HCL 500 MG PO TABS: ORAL_TABLET | ORAL | 3 refills | Status: DC

## 2020-06-02 NOTE — Progress Notes (Signed)
Patient called stating she needs refill on Metformin since her dose was change to 1 tablet in the morning and 2 tablets in the evening. Kathrene Alu RN

## 2020-06-07 ENCOUNTER — Ambulatory Visit: Payer: 59

## 2020-06-07 MED FILL — METFORMIN HCL 500 MG TABS: 500 | 30 days supply | Qty: 90 | Fill #0

## 2020-06-08 MED FILL — ENOXAPARIN SODIUM 120 MG/0.: 120 | 30 days supply | Qty: 24 | Fill #0

## 2020-06-09 ENCOUNTER — Other Ambulatory Visit: Payer: Self-pay

## 2020-06-09 MED ORDER — COMFORT FIT MATERNITY SUPP MED MISC
1.0000 [IU] | 0 refills | Status: DC | PRN
Start: 2020-06-09 — End: 2020-06-15

## 2020-06-09 MED ORDER — COMFORT FIT MATERNITY SUPP MED MISC: 1.0000 [IU] | 0 refills | Status: DC | PRN

## 2020-06-09 NOTE — Progress Notes (Signed)
Print script for maternity belt to take to medical supply store. Kathrene Alu RN

## 2020-06-14 ENCOUNTER — Ambulatory Visit (INDEPENDENT_AMBULATORY_CARE_PROVIDER_SITE_OTHER): Payer: 59 | Admitting: Psychology

## 2020-06-14 ENCOUNTER — Other Ambulatory Visit: Payer: Self-pay | Admitting: *Deleted

## 2020-06-14 ENCOUNTER — Ambulatory Visit: Payer: 59 | Attending: Obstetrics

## 2020-06-14 ENCOUNTER — Encounter: Payer: Self-pay | Admitting: *Deleted

## 2020-06-14 ENCOUNTER — Other Ambulatory Visit: Payer: Self-pay

## 2020-06-14 ENCOUNTER — Ambulatory Visit: Payer: 59 | Admitting: *Deleted

## 2020-06-14 VITALS — BP 121/81 | HR 105

## 2020-06-14 DIAGNOSIS — F4323 Adjustment disorder with mixed anxiety and depressed mood: Secondary | ICD-10-CM | POA: Diagnosis not present

## 2020-06-14 DIAGNOSIS — O10912 Unspecified pre-existing hypertension complicating pregnancy, second trimester: Secondary | ICD-10-CM

## 2020-06-14 DIAGNOSIS — F419 Anxiety disorder, unspecified: Secondary | ICD-10-CM

## 2020-06-14 DIAGNOSIS — O24415 Gestational diabetes mellitus in pregnancy, controlled by oral hypoglycemic drugs: Secondary | ICD-10-CM

## 2020-06-14 DIAGNOSIS — O2692 Pregnancy related conditions, unspecified, second trimester: Secondary | ICD-10-CM | POA: Diagnosis not present

## 2020-06-14 DIAGNOSIS — O99342 Other mental disorders complicating pregnancy, second trimester: Secondary | ICD-10-CM

## 2020-06-14 DIAGNOSIS — O24912 Unspecified diabetes mellitus in pregnancy, second trimester: Secondary | ICD-10-CM | POA: Insufficient documentation

## 2020-06-14 DIAGNOSIS — I10 Essential (primary) hypertension: Secondary | ICD-10-CM

## 2020-06-14 DIAGNOSIS — O99512 Diseases of the respiratory system complicating pregnancy, second trimester: Secondary | ICD-10-CM

## 2020-06-14 DIAGNOSIS — J45909 Unspecified asthma, uncomplicated: Secondary | ICD-10-CM

## 2020-06-14 DIAGNOSIS — O99212 Obesity complicating pregnancy, second trimester: Secondary | ICD-10-CM | POA: Diagnosis not present

## 2020-06-14 DIAGNOSIS — E669 Obesity, unspecified: Secondary | ICD-10-CM

## 2020-06-14 DIAGNOSIS — Z3A24 24 weeks gestation of pregnancy: Secondary | ICD-10-CM

## 2020-06-14 DIAGNOSIS — O34219 Maternal care for unspecified type scar from previous cesarean delivery: Secondary | ICD-10-CM | POA: Diagnosis not present

## 2020-06-14 DIAGNOSIS — O10919 Unspecified pre-existing hypertension complicating pregnancy, unspecified trimester: Secondary | ICD-10-CM

## 2020-06-15 ENCOUNTER — Other Ambulatory Visit: Payer: Self-pay

## 2020-06-15 MED ORDER — WRIST SPLINT/COCK-UP/LEFT M MISC: 1.0000 [IU] | Freq: Every day | 0 refills | Status: DC

## 2020-06-15 MED ORDER — BLOOD GLUCOSE METER KIT: PACK | 0 refills | Status: DC

## 2020-06-15 MED ORDER — WRIST SPLINT/COCK-UP/RIGHT M MISC: 1.0000 [IU] | Freq: Every day | 0 refills | Status: DC

## 2020-06-15 MED ORDER — WRIST SPLINT/COCK-UP/RIGHT M MISC
1.0000 [IU] | Freq: Every day | 0 refills | Status: DC
Start: 2020-06-15 — End: 2020-07-12

## 2020-06-15 MED ORDER — COMFORT FIT MATERNITY SUPP MED MISC: 1.0000 [IU] | 0 refills | Status: DC | PRN

## 2020-06-15 MED FILL — FREESTYLE LITE TEST STRIP: 25 days supply | Qty: 100 | Fill #0

## 2020-06-15 MED FILL — FREESTYLE LITE METER: W/DEVICE | 30 days supply | Qty: 1 | Fill #0

## 2020-06-15 MED FILL — FREESTYLE LANCETS: 25 days supply | Qty: 100 | Fill #0

## 2020-06-15 NOTE — Progress Notes (Signed)
Patient called and wants a blood glucose meter to check her blood sugars. This is because she used her grandmothers meter and it was lower than her dexcom meter. The reason she checked it because she wasn't feeling well. Kathrene Alu RN

## 2020-06-15 NOTE — Addendum Note (Signed)
Addended by: Truett Mainland on: 06/15/2020 03:56 PM   Modules accepted: Orders

## 2020-06-15 NOTE — Progress Notes (Signed)
Patient to pick up scripts for wrist splints. Kathrene Alu RN

## 2020-06-18 MED FILL — DEXCOM G6 SENSOR MISC: 30 days supply | Qty: 3 | Fill #3

## 2020-06-18 MED FILL — ASPIRIN ADULT LOW STRENGTH: 81 | 30 days supply | Qty: 30 | Fill #1

## 2020-06-28 ENCOUNTER — Encounter (HOSPITAL_COMMUNITY): Payer: Self-pay | Admitting: Obstetrics and Gynecology

## 2020-06-28 ENCOUNTER — Telehealth: Payer: Self-pay

## 2020-06-28 ENCOUNTER — Other Ambulatory Visit: Payer: Self-pay

## 2020-06-28 ENCOUNTER — Inpatient Hospital Stay (HOSPITAL_COMMUNITY)
Admission: AD | Admit: 2020-06-28 | Discharge: 2020-06-28 | Disposition: A | Discharge status: Home / Self Care | Payer: 59 | Attending: Family Medicine | Admitting: Family Medicine

## 2020-06-28 DIAGNOSIS — E119 Type 2 diabetes mellitus without complications: Secondary | ICD-10-CM | POA: Diagnosis not present

## 2020-06-28 DIAGNOSIS — O10012 Pre-existing essential hypertension complicating pregnancy, second trimester: Secondary | ICD-10-CM | POA: Insufficient documentation

## 2020-06-28 DIAGNOSIS — O24119 Pre-existing diabetes mellitus, type 2, in pregnancy, unspecified trimester: Secondary | ICD-10-CM

## 2020-06-28 DIAGNOSIS — Z7984 Long term (current) use of oral hypoglycemic drugs: Secondary | ICD-10-CM | POA: Insufficient documentation

## 2020-06-28 DIAGNOSIS — R1030 Lower abdominal pain, unspecified: Secondary | ICD-10-CM

## 2020-06-28 DIAGNOSIS — R102 Pelvic and perineal pain: Secondary | ICD-10-CM | POA: Diagnosis not present

## 2020-06-28 DIAGNOSIS — Z7982 Long term (current) use of aspirin: Secondary | ICD-10-CM | POA: Insufficient documentation

## 2020-06-28 DIAGNOSIS — O24112 Pre-existing diabetes mellitus, type 2, in pregnancy, second trimester: Secondary | ICD-10-CM | POA: Diagnosis not present

## 2020-06-28 DIAGNOSIS — Z7901 Long term (current) use of anticoagulants: Secondary | ICD-10-CM | POA: Diagnosis not present

## 2020-06-28 DIAGNOSIS — R030 Elevated blood-pressure reading, without diagnosis of hypertension: Secondary | ICD-10-CM

## 2020-06-28 DIAGNOSIS — O26892 Other specified pregnancy related conditions, second trimester: Secondary | ICD-10-CM | POA: Insufficient documentation

## 2020-06-28 DIAGNOSIS — Z3A26 26 weeks gestation of pregnancy: Secondary | ICD-10-CM | POA: Diagnosis not present

## 2020-06-28 DIAGNOSIS — I10 Essential (primary) hypertension: Secondary | ICD-10-CM

## 2020-06-28 LAB — URINALYSIS, ROUTINE W REFLEX MICROSCOPIC
Bilirubin Urine: NEGATIVE
Glucose, UA: 150 mg/dL — AB
Hgb urine dipstick: NEGATIVE
Ketones, ur: NEGATIVE mg/dL
Leukocytes,Ua: NEGATIVE
Nitrite: NEGATIVE
Protein, ur: 100 mg/dL — AB
Specific Gravity, Urine: 1.032 — ABNORMAL HIGH (ref 1.005–1.030)
pH: 5 (ref 5.0–8.0)

## 2020-06-28 LAB — WET PREP, GENITAL
Clue Cells Wet Prep HPF POC: NONE SEEN
Sperm: NONE SEEN
Trich, Wet Prep: NONE SEEN
Yeast Wet Prep HPF POC: NONE SEEN

## 2020-06-28 NOTE — MAU Note (Signed)
. °  Danielle Garcia is a 29 y.o. at [redacted]w[redacted]d here in MAU reporting: her lower abdomen started hurting last night, at work this morning she states it feels like something is pulling in the bottom of her abdomen. Denies any VB or LOF. Previous C/S 2011  Onset of complaint: last night Pain score: 9 Vitals:   06/28/20 0923  BP: (!) 153/90  Pulse: 100  Resp: 16  Temp: 98.6 F (37 C)  SpO2: 100%     FHT:140 Lab orders placed from triage: UA

## 2020-06-28 NOTE — MAU Provider Note (Signed)
Chief Complaint  Patient presents with  . Abdominal Pain   HPI   Danielle Garcia is 29 yo G3P1011 with a hx of pre-gestational DM, hx of previous c/s that presents to the MAU with complaint of sudden onset pelvic pressure and pain. She reports that the pain started this morning after arriving to work and occurs with walking. She reports that she felt a "pulling sensation" in her lower pelvic area. Rates pain 9/10 with walking and 6/10 with rest.  Denies any contractions, LOF or vaginal bleeding.Reports good fetal movement. She has been seen in MAU for previous complaints of cramping and suprapubic pain but reports that this pain felt different. She does not report any changes to her morning routine.  Denies any headaches, vision changes or RUQ pain.    OB History    Gravida  3   Para  1   Term  1   Preterm      AB  1   Living  1     SAB  1   IAB      Ectopic      Multiple      Live Births  1        Obstetric Comments  9#, "wouldn't go into birth canal"        Past Medical History:  Diagnosis Date  . Anxiety   . Asthma   . Depression    doing ok now  . Diabetes mellitus without complication ([place])   . Fibroid   . Floaters   . GERD (gastroesophageal reflux disease)   . HTN (hypertension)   . Infection    UTI  . Migraine   . Pre-diabetes   . Pulmonary embolus ([place])    after first delivery    Past Surgical History:  Procedure Laterality Date  . CESAREAN SECTION    . DILATION AND EVACUATION N/A 07/02/2018   Procedure: DILATATION AND EVACUATION;  Surgeon: Aletha Halim, MD;  Location: [facility];  Service: Gynecology;  Laterality: N/A;  . WISDOM TOOTH EXTRACTION      Family History  Problem Relation Age of Onset  . Hypertension Mother   . Heart attack Father 62  . Obesity Father   . Hypertension Maternal Grandmother   . Diabetes Maternal Grandmother   . Lung cancer Maternal Grandfather   . Diabetes Paternal Grandmother   . Allergies  Daughter     Social History   Tobacco Use  . Smoking status: Never Smoker  . Smokeless tobacco: Never Used  Vaping Use  . Vaping Use: Never used  Substance Use Topics  . Alcohol use: Not Currently    Comment: occasionally  . Drug use: No    Allergies: No Known Allergies  Medications Prior to Admission  Medication Sig Dispense Refill Last Dose  . aspirin EC 81 MG tablet Take 1 tablet (81 mg total) by mouth daily. Swallow whole. 30 tablet 11 06/27/2020 at Unknown time  . cyclobenzaprine (FLEXERIL) 10 MG tablet Take 1 tablet (10 mg total) by mouth 2 (two) times daily as needed for muscle spasms. 20 tablet 0 Past Month at Unknown time  . enoxaparin (LOVENOX) 120 MG/0.8ML injection Inject 0.8 mLs (120 mg total) into the skin daily. 72 mL 3 06/28/2020 at Unknown time  . metFORMIN (GLUCOPHAGE) 500 MG tablet 1 tablet (571m) in the morning and 2 tablets (10071m with dinner. 90 tablet 3 06/28/2020 at Unknown time  . pantoprazole (PROTONIX) 40 MG tablet Take 1 tablet (  40 mg total) by mouth daily. 30 tablet 3 06/27/2020 at Unknown time  . Prenatal Vit-Fe Fumarate-FA (PRENATAL VITAMIN) 27-0.8 MG TABS Take 1 tablet by mouth daily. 30 tablet  06/28/2020 at Unknown time  . blood glucose meter kit and supplies Dispense based on patient and insurance preference. Use up to four times daily as directed. (FOR ICD-10 E10.9, E11.9). 1 each 0   . cetirizine (ZYRTEC) 10 MG tablet Take 10 mg by mouth daily.     . Continuous Blood Gluc Receiver (DEXCOM G6 RECEIVER) DEVI 1 Units by Does not apply route See admin instructions. Check CBGs 4 times daily 1 each 0   . Continuous Blood Gluc Sensor (DEXCOM G6 SENSOR) MISC 1 Units by Does not apply route See admin instructions. Change every 10 days 9 each 3   . Continuous Blood Gluc Transmit (DEXCOM G6 TRANSMITTER) MISC 1 Units by Does not apply route as directed. 1 each 0   . Doxylamine-Pyridoxine (DICLEGIS) 10-10 MG TBEC Take 2 tablets by mouth at bedtime as needed.  60 tablet 5   . Elastic Bandages & Supports (COMFORT FIT MATERNITY SUPP MED) MISC 1 Units by Does not apply route as needed. 1 each 0   . Elastic Bandages & Supports (WRIST SPLINT/COCK-UP/LEFT M) MISC 1 Units by Does not apply route at bedtime. 1 each 0   . Elastic Bandages & Supports (WRIST SPLINT/COCK-UP/RIGHT M) MISC 1 Units by Does not apply route at bedtime. 1 each 0   . mometasone (NASONEX) 50 MCG/ACT nasal spray Place 2 sprays into the nose daily. 1 each 11   . mometasone (NASONEX) 50 MCG/ACT nasal spray Place 2 sprays into the nose daily. 1 each 11   . nortriptyline (PAMELOR) 10 MG capsule  (Patient not taking: Reported on 06/14/2020)     . ondansetron (ZOFRAN ODT) 4 MG disintegrating tablet Take 1 tablet (4 mg total) by mouth every 6 (six) hours as needed for nausea. (Patient not taking: Reported on 06/14/2020) 20 tablet 0   . pantoprazole (PROTONIX) 40 MG tablet      . valACYclovir (VALTREX) 1000 MG tablet Take 1 tab daily for 5 days during outbreaks. 30 tablet 1 More than a month at Unknown time    Review of Systems  Genitourinary: Positive for pelvic pain.  All other systems reviewed and are negative.  Physical Exam Blood pressure 128/76, pulse 92, temperature 98.6 F (37 C), resp. rate 16, height 5' 3"  (1.6 m), weight 121.1 kg, last menstrual period 12/23/2019, SpO2 100 %, unknown if currently breastfeeding. Physical Exam Constitutional:      Appearance: She is obese.  Cardiovascular:     Rate and Rhythm: Normal rate.  Pulmonary:     Effort: Pulmonary effort is normal.  Abdominal:     General: Abdomen is protuberant.     Palpations: Abdomen is soft.     Comments: gravid  Neurological:     Mental Status: She is alert.     MAU Course Procedures   -FHR was reassuring -Wet prep was negative  -UA was significant for Proteinuria and rare bacteria, negative for LE/ nitrities  1. Pelvic Pain: Patient reports sudden onset on pelvic pain that feels in pressure and  pulling sensation. Given that patient denies LOF, bleeding or contractions, with a reassuring NST with moderate variability and no contractions, or decelerations, less likely concern for Pre-term Labor. Most likely increased pelvic pressure due to increasing fetus size. Encouraged patient to use pregnancy belt to alleviate pelvic pressure  due to pregnancy. Patient has a prescription for pregnancy belt and a prenatal follow up in 2 days. -- encourage Pregnancy belt use  --Prenatal follow up   2. Hypertension Patient arrived at MAU with elevated BP readings.Reports she anxious and experiencing pain with walking before readings. Patient denied any Preeclampsia symptoms. Subsequent BP was within normal limits.   Jacklynn Bue, Medical Student 06/28/2020 11:40

## 2020-06-28 NOTE — Telephone Encounter (Signed)
Pt called stating she woke up with pain and cramping at 2 am. Pt states the pain feels like something is pulling and it hurts more when she walks. Advised pt to go to [facility] to be seen because we are unable to bring her in to the office this morning. Understanding was voiced.  Iliyana Convey l Jenella Craigie, CMA

## 2020-06-30 ENCOUNTER — Ambulatory Visit (INDEPENDENT_AMBULATORY_CARE_PROVIDER_SITE_OTHER): Payer: 59 | Admitting: Family Medicine

## 2020-06-30 ENCOUNTER — Other Ambulatory Visit: Payer: Self-pay

## 2020-06-30 VITALS — BP 134/77 | HR 74 | Wt 272.0 lb

## 2020-06-30 DIAGNOSIS — B009 Herpesviral infection, unspecified: Secondary | ICD-10-CM

## 2020-06-30 DIAGNOSIS — R102 Pelvic and perineal pain: Secondary | ICD-10-CM

## 2020-06-30 DIAGNOSIS — Z3A27 27 weeks gestation of pregnancy: Secondary | ICD-10-CM | POA: Diagnosis not present

## 2020-06-30 DIAGNOSIS — O0990 Supervision of high risk pregnancy, unspecified, unspecified trimester: Secondary | ICD-10-CM

## 2020-06-30 DIAGNOSIS — O10919 Unspecified pre-existing hypertension complicating pregnancy, unspecified trimester: Secondary | ICD-10-CM

## 2020-06-30 DIAGNOSIS — D251 Intramural leiomyoma of uterus: Secondary | ICD-10-CM

## 2020-06-30 DIAGNOSIS — O24119 Pre-existing diabetes mellitus, type 2, in pregnancy, unspecified trimester: Secondary | ICD-10-CM

## 2020-06-30 DIAGNOSIS — O24919 Unspecified diabetes mellitus in pregnancy, unspecified trimester: Secondary | ICD-10-CM

## 2020-06-30 DIAGNOSIS — Z86711 Personal history of pulmonary embolism: Secondary | ICD-10-CM

## 2020-06-30 DIAGNOSIS — K219 Gastro-esophageal reflux disease without esophagitis: Secondary | ICD-10-CM

## 2020-06-30 MED ORDER — METFORMIN HCL 500 MG PO TABS: 500.0000 mg | ORAL_TABLET | Freq: Every day | ORAL | 3 refills | Status: DC

## 2020-06-30 NOTE — Progress Notes (Signed)
   PRENATAL VISIT NOTE  Subjective:  Danielle Garcia is a 29 y.o. G3P1011 at [redacted]w[redacted]d being seen today for ongoing prenatal care.  She is currently monitored for the following issues for this high-risk pregnancy and has HSV-2 (herpes simplex virus 2) infection; Other fatigue; Shortness of breath on exertion; Migraine; Essential hypertension; Back pain; Urinary frequency; Gastroesophageal reflux disease; Morbid obesity ([place]); Vitamin D deficiency; Supervision of high risk pregnancy, antepartum; Asthma; History of pulmonary embolism; Oral thrush; and Diabetes mellitus during pregnancy, antepartum on their problem list.  Patient reports continued pelvic pain.  Contractions: Not present. Vag. Bleeding: None.  Movement: Present. Denies leaking of fluid.   The following portions of the patient's history were reviewed and updated as appropriate: allergies, current medications, past family history, past medical history, past social history, past surgical history and problem list.   Objective:   Vitals:   06/30/20 1309  BP: 134/77  Pulse: 74  Weight: 272 lb (123.4 kg)    Fetal Status: Fetal Heart Rate (bpm): 138   Movement: Present     General:  Alert, oriented and cooperative. Patient is in no acute distress.  Skin: Skin is warm and dry. No rash noted.   Cardiovascular: Normal heart rate noted  Respiratory: Normal respiratory effort, no problems with respiration noted  Abdomen: Soft, gravid, appropriate for gestational age.  Pain/Pressure: Present     Pelvic: Cervical exam deferred        Extremities: Normal range of motion.  Edema: None  Mental Status: Normal mood and affect. Normal behavior. Normal judgment and thought content.   Assessment and Plan:  Pregnancy: G3P1011 at 104w1d 1. [redacted] weeks gestation of pregnancy  2. Supervision of high risk pregnancy, antepartum FHT normal  3. Pre-existing type 2 diabetes mellitus during pregnancy, antepartum Taking ASA Fastings slightly  elevated. Restart metformin 500mg  qhs. EFW 87% on 1/29. Has Growth in a couple weeks.  4. Preexisting hypertension complicating pregnancy, antepartum On ASA 81mg  - POCT urinalysis dipstick  5. History of pulmonary embolism On lovenox.  6. Morbid obesity ([place])   7. Gastroesophageal reflux disease, unspecified whether esophagitis present On protonix  8. HSV-2 (herpes simplex virus 2) infection PPx at 35 weeks  9. Fibroids, intramural  10. Pelvic pain in female Having difficult with walking - getting sharp pains, especially if works full day. Can manage half day. Will reduce active work hours to half day, then light duty.  Preterm labor symptoms and general obstetric precautions including but not limited to vaginal bleeding, contractions, leaking of fluid and fetal movement were reviewed in detail with the patient. Please refer to After Visit Summary for other counseling recommendations.   No follow-ups on file.  Future Appointments  Date Time Provider [place]  07/12/2020  7:15 AM [facility] NURSE [facility]  07/12/2020  7:30 AM [facility] US3 [facility]  07/21/2020  4:00 PM Bauert, Nicolasa Ducking, LCSW [facility] None    Truett Mainland, DO

## 2020-07-01 LAB — CBC
Hematocrit: 36.1 % (ref 34.0–46.6)
Hemoglobin: 12.1 g/dL (ref 11.1–15.9)
MCH: 30.6 pg (ref 26.6–33.0)
MCHC: 33.5 g/dL (ref 31.5–35.7)
MCV: 91 fL (ref 79–97)
Platelets: 293 10*3/uL (ref 150–450)
RBC: 3.95 x10E6/uL (ref 3.77–5.28)
RDW: 12.3 % (ref 11.7–15.4)
WBC: 9.2 10*3/uL (ref 3.4–10.8)

## 2020-07-01 LAB — RPR: RPR Ser Ql: NONREACTIVE

## 2020-07-01 LAB — HIV ANTIBODY (ROUTINE TESTING W REFLEX): HIV Screen 4th Generation wRfx: NONREACTIVE

## 2020-07-01 MED FILL — METFORMIN HCL 500 MG TABS: 500 | 90 days supply | Qty: 90 | Fill #0

## 2020-07-12 ENCOUNTER — Encounter: Payer: Self-pay | Admitting: Obstetrics & Gynecology

## 2020-07-12 ENCOUNTER — Ambulatory Visit: Payer: 59 | Admitting: *Deleted

## 2020-07-12 ENCOUNTER — Other Ambulatory Visit: Payer: Self-pay | Admitting: *Deleted

## 2020-07-12 ENCOUNTER — Ambulatory Visit (INDEPENDENT_AMBULATORY_CARE_PROVIDER_SITE_OTHER): Payer: 59 | Admitting: Obstetrics & Gynecology

## 2020-07-12 ENCOUNTER — Other Ambulatory Visit: Payer: Self-pay

## 2020-07-12 ENCOUNTER — Ambulatory Visit: Payer: 59 | Attending: Obstetrics and Gynecology

## 2020-07-12 ENCOUNTER — Encounter: Payer: Self-pay | Admitting: *Deleted

## 2020-07-12 VITALS — BP 131/65 | HR 81

## 2020-07-12 VITALS — BP 113/79 | Wt 271.0 lb

## 2020-07-12 DIAGNOSIS — O24119 Pre-existing diabetes mellitus, type 2, in pregnancy, unspecified trimester: Secondary | ICD-10-CM

## 2020-07-12 DIAGNOSIS — O10919 Unspecified pre-existing hypertension complicating pregnancy, unspecified trimester: Secondary | ICD-10-CM | POA: Insufficient documentation

## 2020-07-12 DIAGNOSIS — F419 Anxiety disorder, unspecified: Secondary | ICD-10-CM

## 2020-07-12 DIAGNOSIS — O24415 Gestational diabetes mellitus in pregnancy, controlled by oral hypoglycemic drugs: Secondary | ICD-10-CM | POA: Diagnosis not present

## 2020-07-12 DIAGNOSIS — Z86711 Personal history of pulmonary embolism: Secondary | ICD-10-CM

## 2020-07-12 DIAGNOSIS — B009 Herpesviral infection, unspecified: Secondary | ICD-10-CM

## 2020-07-12 DIAGNOSIS — O34219 Maternal care for unspecified type scar from previous cesarean delivery: Secondary | ICD-10-CM

## 2020-07-12 DIAGNOSIS — O10013 Pre-existing essential hypertension complicating pregnancy, third trimester: Secondary | ICD-10-CM | POA: Diagnosis not present

## 2020-07-12 DIAGNOSIS — O99213 Obesity complicating pregnancy, third trimester: Secondary | ICD-10-CM | POA: Diagnosis not present

## 2020-07-12 DIAGNOSIS — D259 Leiomyoma of uterus, unspecified: Secondary | ICD-10-CM

## 2020-07-12 DIAGNOSIS — J4520 Mild intermittent asthma, uncomplicated: Secondary | ICD-10-CM

## 2020-07-12 DIAGNOSIS — O99513 Diseases of the respiratory system complicating pregnancy, third trimester: Secondary | ICD-10-CM | POA: Diagnosis not present

## 2020-07-12 DIAGNOSIS — O99343 Other mental disorders complicating pregnancy, third trimester: Secondary | ICD-10-CM | POA: Diagnosis not present

## 2020-07-12 DIAGNOSIS — J45909 Unspecified asthma, uncomplicated: Secondary | ICD-10-CM | POA: Diagnosis not present

## 2020-07-12 DIAGNOSIS — O0990 Supervision of high risk pregnancy, unspecified, unspecified trimester: Secondary | ICD-10-CM

## 2020-07-12 DIAGNOSIS — Z3A28 28 weeks gestation of pregnancy: Secondary | ICD-10-CM

## 2020-07-12 DIAGNOSIS — O3413 Maternal care for benign tumor of corpus uteri, third trimester: Secondary | ICD-10-CM

## 2020-07-12 DIAGNOSIS — Z362 Encounter for other antenatal screening follow-up: Secondary | ICD-10-CM

## 2020-07-12 MED ORDER — DEXCOM G6 TRANSMITTER MISC: 1.0000 [IU] | 0 refills | Status: DC

## 2020-07-12 NOTE — Progress Notes (Signed)
Patient states she was seen at [facility] this morning for ultrasound and the [facility]FM doctor stated she should take the gtt test since her HgbA1C was just elevated. Asked patient to speak with provider about this. Armandina Stammer RN

## 2020-07-12 NOTE — Progress Notes (Signed)
   PRENATAL VISIT NOTE  Subjective:  Danielle Garcia is a 29 y.o. G3P1011 at [redacted]w[redacted]d being seen today for ongoing prenatal care.  She is currently monitored for the following issues for this high-risk pregnancy and has HSV-2 (herpes simplex virus 2) infection; Other fatigue; Shortness of breath on exertion; Migraine; Essential hypertension; Back pain; Urinary frequency; Gastroesophageal reflux disease; Morbid obesity (HCC); Vitamin D deficiency; Supervision of high risk pregnancy, antepartum; Asthma; History of pulmonary embolism; Oral thrush; and Diabetes mellitus during pregnancy, antepartum on their problem list.  Patient reports no complaints.  Contractions: Irritability. Vag. Bleeding: None.  Movement: Present. Denies leaking of fluid.   The following portions of the patient's history were reviewed and updated as appropriate: allergies, current medications, past family history, past medical history, past social history, past surgical history and problem list.   Objective:   Vitals:   07/12/20 0943  BP: 113/79  Weight: 271 lb (122.9 kg)    Fetal Status: Fetal Heart Rate (bpm): 140   Movement: Present     General:  Alert, oriented and cooperative. Patient is in no acute distress.  Skin: Skin is warm and dry. No rash noted.   Cardiovascular: Normal heart rate noted  Respiratory: Normal respiratory effort, no problems with respiration noted  Abdomen: Soft, gravid, appropriate for gestational age.  Pain/Pressure: Present     Pelvic: Cervical exam deferred        Extremities: Normal range of motion.  Edema: None  Mental Status: Normal mood and affect. Normal behavior. Normal judgment and thought content.   Assessment and Plan:  Pregnancy: G3P1011 at [redacted]w[redacted]d 1. Supervision of high risk pregnancy, antepartum  2. Pre-existing type 2 diabetes mellitus during pregnancy, antepartum On Metformin 500mg  qHS. The am glc is still sl elevated but, is in the mid 90's. Her 2 hour PP are all around  120's   07/12/2020 Est. FW:    1497  gm      3 lb 5 oz     80  % 3. History of pulmonary embolism On Lovenox  4. Mild intermittent asthma without complication  5. Morbid obesity (HCC)  6. HTN On baby ASA  7. HSV: Needs anti viral meds at 36 weeks.  Preterm labor symptoms and general obstetric precautions including but not limited to vaginal bleeding, contractions, leaking of fluid and fetal movement were reviewed in detail with the patient. Please refer to After Visit Summary for other counseling recommendations.   No follow-ups on file.  Future Appointments  Date Time Provider Department Center  07/21/2020  4:00 PM 09/18/2020, Marlinda Mike [facility] None  08/09/2020  8:45 AM [facility] US4 [facility]  08/16/2020  8:45 AM [facility] US4 [facility]  08/23/2020  8:45 AM [facility] US4 [facility]    10/21/2020, MD

## 2020-07-15 MED FILL — ENOXAPARIN SODIUM 120 MG/0.: 120 | 30 days supply | Qty: 24 | Fill #1

## 2020-07-21 ENCOUNTER — Ambulatory Visit (INDEPENDENT_AMBULATORY_CARE_PROVIDER_SITE_OTHER): Payer: 59 | Admitting: Psychology

## 2020-07-21 DIAGNOSIS — F4323 Adjustment disorder with mixed anxiety and depressed mood: Secondary | ICD-10-CM | POA: Diagnosis not present

## 2020-07-28 ENCOUNTER — Ambulatory Visit (INDEPENDENT_AMBULATORY_CARE_PROVIDER_SITE_OTHER): Payer: 59 | Admitting: Obstetrics & Gynecology

## 2020-07-28 ENCOUNTER — Other Ambulatory Visit: Payer: Self-pay

## 2020-07-28 VITALS — BP 147/85 | HR 108 | Wt 270.0 lb

## 2020-07-28 DIAGNOSIS — O24410 Gestational diabetes mellitus in pregnancy, diet controlled: Secondary | ICD-10-CM

## 2020-07-28 DIAGNOSIS — O0990 Supervision of high risk pregnancy, unspecified, unspecified trimester: Secondary | ICD-10-CM

## 2020-07-28 DIAGNOSIS — Z3A31 31 weeks gestation of pregnancy: Secondary | ICD-10-CM | POA: Diagnosis not present

## 2020-07-28 DIAGNOSIS — O98313 Other infections with a predominantly sexual mode of transmission complicating pregnancy, third trimester: Secondary | ICD-10-CM

## 2020-07-28 DIAGNOSIS — Z3A18 18 weeks gestation of pregnancy: Secondary | ICD-10-CM | POA: Diagnosis not present

## 2020-07-28 DIAGNOSIS — Z86711 Personal history of pulmonary embolism: Secondary | ICD-10-CM | POA: Diagnosis not present

## 2020-07-28 DIAGNOSIS — B009 Herpesviral infection, unspecified: Secondary | ICD-10-CM

## 2020-07-28 DIAGNOSIS — O10919 Unspecified pre-existing hypertension complicating pregnancy, unspecified trimester: Secondary | ICD-10-CM | POA: Diagnosis not present

## 2020-07-28 DIAGNOSIS — O24119 Pre-existing diabetes mellitus, type 2, in pregnancy, unspecified trimester: Secondary | ICD-10-CM

## 2020-07-28 LAB — POCT URINALYSIS DIPSTICK
Bilirubin, UA: NEGATIVE
Blood, UA: NEGATIVE
Glucose, UA: NEGATIVE
Ketones, UA: NEGATIVE
Leukocytes, UA: NEGATIVE
Nitrite, UA: NEGATIVE
Protein, UA: NEGATIVE
Spec Grav, UA: 1.02 (ref 1.010–1.025)
Urobilinogen, UA: 0.2 E.U./dL
pH, UA: 6 (ref 5.0–8.0)

## 2020-07-28 LAB — POCT URINALYSIS DIPSTICK OB
Bilirubin, UA: NEGATIVE
Blood, UA: NEGATIVE
Glucose, UA: NEGATIVE
Leukocytes, UA: NEGATIVE
Nitrite, UA: NEGATIVE
Spec Grav, UA: 1.015 (ref 1.010–1.025)
Urobilinogen, UA: 1 E.U./dL
pH, UA: 7.5 (ref 5.0–8.0)

## 2020-07-28 NOTE — Progress Notes (Signed)
   PRENATAL VISIT NOTE  Subjective:  Danielle Garcia is a 30 y.o. G3P1011 at [redacted]w[redacted]d being seen today for ongoing prenatal care.  She is currently monitored for the following issues for this high-risk pregnancy and has HSV-2 (herpes simplex virus 2) infection; Other fatigue; Shortness of breath on exertion; Migraine; Essential hypertension; Back pain; Urinary frequency; Gastroesophageal reflux disease; Morbid obesity ([place]); Vitamin D deficiency; Supervision of high risk pregnancy, antepartum; Asthma; History of pulmonary embolism; and Diabetes mellitus during pregnancy, antepartum on their problem list.  Patient reports no headache, no visual changes, no increased swelling.  Contractions: Not present. Vag. Bleeding: None.  Movement: (!) Decreased. Denies leaking of fluid.   Pt does not have BS with her.  Fastings BS are less than 96.  2 hr PP are all less than 120 per pt.    The following portions of the patient's history were reviewed and updated as appropriate: allergies, current medications, past family history, past medical history, past social history, past surgical history and problem list.   Objective:   Vitals:   07/28/20 0807 07/28/20 0811  BP: 138/90 (!) 147/85  Pulse: 95 (!) 108  Weight: 270 lb (122.5 kg)     Fetal Status: Fetal Heart Rate (bpm): 140 Fundal Height: 36 cm Movement: (!) Decreased     General:  Alert, oriented and cooperative. Patient is in no acute distress.  Skin: Skin is warm and dry. No rash noted.   Cardiovascular: Normal heart rate noted  Respiratory: Normal respiratory effort, no problems with respiration noted  Abdomen: Soft, gravid, appropriate for gestational age.  Pain/Pressure: Present     Pelvic: Cervical exam deferred        Extremities: Normal range of motion.  Edema: None  Mental Status: Normal mood and affect. Normal behavior. Normal judgment and thought content.   Assessment and Plan:  Pregnancy: G3P1011 at [redacted]w[redacted]d 1. [redacted] weeks gestation of  pregnancy - On PNV - POC Urinalysis Dipstick OB -NST will be done today -Tdap discussed.  States she would like to defer until next visit.  2. Diet controlled gestational diabetes mellitus (GDM), antepartum - POC Urinalysis Dipstick OB  3. HSV-2 (herpes simplex virus 2) infection - valtrex in the last trimester recommended  4. History of pulmonary embolism - on 120mg  sq Lovenox injection  5. Pre-existing type 2 diabetes mellitus during pregnancy, antepartum -Recommended she bring blood sugars with her.   -Growth scan on 08/09/2020 -weekly BPPs starting 08/09/2020 -On metformin 500mg  bid  6. Preexisting hypertension complicating pregnancy, antepartum -protein/creatinine ratio, CMP will be obtained today  Preterm labor symptoms and general obstetric precautions including but not limited to vaginal bleeding, contractions, leaking of fluid and fetal movement were reviewed in detail with the patient. Please refer to After Visit Summary for other counseling recommendations.   Return in about 2 weeks (around 08/11/2020) for Office ob visit (MD or APP), Tdap.  Future Appointments  Date Time Provider [place]  08/09/2020  8:30 AM [facility] NURSE [facility]  08/09/2020  8:45 AM [facility] US4 [facility]  08/13/2020 10:00 AM Bauert, Nicolasa Ducking, LCSW [facility] None  08/16/2020  8:30 AM [facility] NURSE [facility]  08/16/2020  8:45 AM [facility] US[facility]  08/23/2020  8:30 AM [facility] NURSE [facility]  08/23/2020  8:45 AM [facility] US4 [facility]    Megan Salon, MD

## 2020-07-29 LAB — PROTEIN / CREATININE RATIO, URINE
Creatinine, Urine: 198.5 mg/dL
Protein, Ur: 56.5 mg/dL
Protein/Creat Ratio: 285 mg/g creat — ABNORMAL HIGH (ref 0–200)

## 2020-07-30 ENCOUNTER — Inpatient Hospital Stay (HOSPITAL_COMMUNITY)
Admission: AD | Admit: 2020-07-30 | Discharge: 2020-07-30 | Disposition: A | Discharge status: Home / Self Care | Payer: 59 | Attending: Obstetrics and Gynecology | Admitting: Obstetrics and Gynecology

## 2020-07-30 ENCOUNTER — Encounter (HOSPITAL_COMMUNITY): Payer: Self-pay | Admitting: Obstetrics and Gynecology

## 2020-07-30 ENCOUNTER — Other Ambulatory Visit: Payer: Self-pay

## 2020-07-30 DIAGNOSIS — Z3A31 31 weeks gestation of pregnancy: Secondary | ICD-10-CM | POA: Diagnosis not present

## 2020-07-30 DIAGNOSIS — R1012 Left upper quadrant pain: Secondary | ICD-10-CM | POA: Insufficient documentation

## 2020-07-30 DIAGNOSIS — O26893 Other specified pregnancy related conditions, third trimester: Secondary | ICD-10-CM | POA: Insufficient documentation

## 2020-07-30 DIAGNOSIS — R1011 Right upper quadrant pain: Secondary | ICD-10-CM | POA: Insufficient documentation

## 2020-07-30 DIAGNOSIS — Z86711 Personal history of pulmonary embolism: Secondary | ICD-10-CM | POA: Diagnosis not present

## 2020-07-30 DIAGNOSIS — R109 Unspecified abdominal pain: Secondary | ICD-10-CM | POA: Diagnosis not present

## 2020-07-30 DIAGNOSIS — N949 Unspecified condition associated with female genital organs and menstrual cycle: Secondary | ICD-10-CM

## 2020-07-30 DIAGNOSIS — Z7982 Long term (current) use of aspirin: Secondary | ICD-10-CM | POA: Diagnosis not present

## 2020-07-30 DIAGNOSIS — Z7901 Long term (current) use of anticoagulants: Secondary | ICD-10-CM | POA: Insufficient documentation

## 2020-07-30 LAB — URINALYSIS, ROUTINE W REFLEX MICROSCOPIC
Bilirubin Urine: NEGATIVE
Glucose, UA: NEGATIVE mg/dL
Hgb urine dipstick: NEGATIVE
Ketones, ur: 5 mg/dL — AB
Leukocytes,Ua: NEGATIVE
Nitrite: NEGATIVE
Protein, ur: NEGATIVE mg/dL
Specific Gravity, Urine: 1.019 (ref 1.005–1.030)
pH: 7 (ref 5.0–8.0)

## 2020-07-30 LAB — FETAL FIBRONECTIN: Fetal Fibronectin: NEGATIVE

## 2020-07-30 NOTE — MAU Note (Signed)
Pt stated she feels like she started having ctx last night about 8pm. Thinks they are about 6-8 min apart. Denies any vag bleeding but c/o feeling wet in her underwear. Good fetal movement felt.

## 2020-07-30 NOTE — Discharge Instructions (Signed)
Round Ligament Pain  The round ligament is a cord of muscle and tissue that helps support the uterus. It can become a source of pain during pregnancy if it becomes stretched or twisted as the baby grows. The pain usually begins in the second trimester (13-28 weeks) of pregnancy, and it can come and go until the baby is delivered. It is not a serious problem, and it does not cause harm to the baby. Round ligament pain is usually a short, sharp, and pinching pain, but it can also be a dull, lingering, and aching pain. The pain is felt in the lower side of the abdomen or in the groin. It usually starts deep in the groin and moves up to the outside of the hip area. The pain may occur when you:  Suddenly change position, such as quickly going from a sitting to standing position.  Roll over in bed.  Cough or sneeze.  Do physical activity. Follow these instructions at home:  Watch your condition for any changes.  When the pain starts, relax. Then try any of these methods to help with the pain: ? Sitting down. ? Flexing your knees up to your abdomen. ? Lying on your side with one pillow under your abdomen and another pillow between your legs. ? Sitting in a warm bath for 15-20 minutes or until the pain goes away.  Take over-the-counter and prescription medicines only as told by your health care provider.  Move slowly when you sit down or stand up.  Avoid long walks if they cause pain.  Stop or reduce your physical activities if they cause pain.  Keep all follow-up visits as told by your health care provider. This is important.   Contact a health care provider if:  Your pain does not go away with treatment.  You feel pain in your back that you did not have before.  Your medicine is not helping. Get help right away if:  You have a fever or chills.  You develop uterine contractions.  You have vaginal bleeding.  You have nausea or vomiting.  You have diarrhea.  You have pain  when you urinate. Summary  Round ligament pain is felt in the lower abdomen or groin. It is usually a short, sharp, and pinching pain. It can also be a dull, lingering, and aching pain.  This pain usually begins in the second trimester (13-28 weeks). It occurs because the uterus is stretching with the growing baby, and it is not harmful to the baby.  You may notice the pain when you suddenly change position, when you cough or sneeze, or during physical activity.  Relaxing, flexing your knees to your abdomen, lying on one side, or taking a warm bath may help to get rid of the pain.  Get help from your health care provider if the pain does not go away or if you have vaginal bleeding, nausea, vomiting, diarrhea, or painful urination. This information is not intended to replace advice given to you by your health care provider. Make sure you discuss any questions you have with your health care provider. Document Revised: 12/19/2017 Document Reviewed: 12/19/2017 Elsevier Patient Education  [place].

## 2020-07-30 NOTE — MAU Provider Note (Signed)
Patient Danielle Garcia is a 30 y.o. G3P1011  at 25w3dhere with complaints of abdominal pain that feels like contractions since last night at 7 pm. She denies vaginal bleeding, decreased fetal movements, dysuria, other vaginal discharge. She is planning a TOLAC. States that she did not labor with her first child because her OB thought baby would not descend and was too big. Patient reports She has Gestational Diabetes on Metformin.  History     CSN: [id number] Arrival date and time: 07/30/20 0745   None     Chief Complaint  Patient presents with  . Contractions   Abdominal Pain This is a new problem. The current episode started yesterday. The problem occurs intermittently. The pain is located in the LLQ, RLQ, LUQ and RUQ. The pain is at a severity of 8/10. The quality of the pain is cramping. The abdominal pain does not radiate. Pertinent negatives include no dysuria. Nothing aggravates the pain. The pain is relieved by nothing.    OB History    Gravida  3   Para  1   Term  1   Preterm      AB  1   Living  1     SAB  1   IAB      Ectopic      Multiple      Live Births  1        Obstetric Comments  9#, "wouldn't go into birth canal"        Past Medical History:  Diagnosis Date  . Anxiety   . Asthma    as a child  . Depression    doing ok now  . Diabetes mellitus without complication ([place]   . Fibroid   . Floaters   . GERD (gastroesophageal reflux disease)   . HTN (hypertension)    2014/15 no meds since then  . Infection    UTI  . Migraine   . Pre-diabetes   . Pulmonary embolus ([place]    after first delivery    Past Surgical History:  Procedure Laterality Date  . CESAREAN SECTION    . DILATION AND EVACUATION N/A 07/02/2018   Procedure: DILATATION AND EVACUATION;  Surgeon: PAletha Halim MD;  [facility];  Service: Gynecology;  Laterality: N/A;  . WISDOM TOOTH EXTRACTION      Family History  Problem Relation Age of Onset  .  Hypertension Mother   . Heart attack Father 474 . Obesity Father   . Hypertension Maternal Grandmother   . Diabetes Maternal Grandmother   . Lung cancer Maternal Grandfather   . Diabetes Paternal Grandmother   . Allergies Daughter     Social History   Tobacco Use  . Smoking status: Never Smoker  . Smokeless tobacco: Never Used  Vaping Use  . Vaping Use: Never used  Substance Use Topics  . Alcohol use: Not Currently    Comment: occasionally  . Drug use: No    Allergies: No Known Allergies  Medications Prior to Admission  Medication Sig Dispense Refill Last Dose  . aspirin EC 81 MG tablet Take 1 tablet (81 mg total) by mouth daily. Swallow whole. 30 tablet 11 07/29/2020 at Unknown time  . cetirizine (ZYRTEC) 10 MG tablet Take 10 mg by mouth daily.   Past Month at Unknown time  . enoxaparin (LOVENOX) 120 MG/0.8ML injection Inject 0.8 mLs (120 mg total) into the skin daily. 72 mL 3 07/30/2020 at Unknown time  . metFORMIN (GLUCOPHAGE)  500 MG tablet Take 1 tablet (500 mg total) by mouth at bedtime. 90 tablet 3 07/29/2020 at Unknown time  . pantoprazole (PROTONIX) 40 MG tablet Take 1 tablet (40 mg total) by mouth daily. 30 tablet 3 07/29/2020 at Unknown time  . Prenatal Vit-Fe Fumarate-FA (PRENATAL VITAMIN) 27-0.8 MG TABS Take 1 tablet by mouth daily. 30 tablet  07/29/2020 at Unknown time  . blood glucose meter kit and supplies Dispense based on patient and insurance preference. Use up to four times daily as directed. (FOR ICD-10 E10.9, E11.9). 1 each 0   . Continuous Blood Gluc Receiver (DEXCOM G6 RECEIVER) DEVI 1 Units by Does not apply route See admin instructions. Check CBGs 4 times daily 1 each 0   . Continuous Blood Gluc Sensor (DEXCOM G6 SENSOR) MISC 1 Units by Does not apply route See admin instructions. Change every 10 days 9 each 3   . Continuous Blood Gluc Transmit (DEXCOM G6 TRANSMITTER) MISC 1 Units by Does not apply route as directed. 1 each 0   . cyclobenzaprine (FLEXERIL) 10  MG tablet Take 1 tablet (10 mg total) by mouth 2 (two) times daily as needed for muscle spasms. 20 tablet 0   . Doxylamine-Pyridoxine (DICLEGIS) 10-10 MG TBEC Take 2 tablets by mouth at bedtime as needed. 60 tablet 5 More than a month at Unknown time  . Elastic Bandages & Supports (COMFORT FIT MATERNITY SUPP MED) MISC 1 Units by Does not apply route as needed. (Patient not taking: Reported on 07/28/2020) 1 each 0   . Elastic Bandages & Supports (WRIST SPLINT/COCK-UP/LEFT M) MISC 1 Units by Does not apply route at bedtime. 1 each 0   . mometasone (NASONEX) 50 MCG/ACT nasal spray Place 2 sprays into the nose daily. 1 each 11 More than a month at Unknown time  . valACYclovir (VALTREX) 1000 MG tablet Take 1 tab daily for 5 days during outbreaks. 30 tablet 1 More than a month at Unknown time    Review of Systems  Constitutional: Negative.   HENT: Negative.   Respiratory: Negative.   Gastrointestinal: Positive for abdominal pain.  Genitourinary: Negative for dyspareunia, dysuria, vaginal bleeding, vaginal discharge and vaginal pain.  Neurological: Negative.   Psychiatric/Behavioral: Negative.    Physical Exam   Blood pressure 117/77, pulse 94, temperature 98.6 F (37 C), resp. rate 18, height 5' 3"  (1.6 m), weight 122 kg, last menstrual period 12/23/2019, unknown if currently breastfeeding.  Physical Exam Constitutional:      Appearance: Normal appearance.  Abdominal:     General: Abdomen is flat.     Palpations: Abdomen is soft.  Musculoskeletal:        General: Normal range of motion.  Skin:    General: Skin is warm.  Neurological:     General: No focal deficit present.     Mental Status: She is alert.  Psychiatric:        Mood and Affect: Mood normal.     MAU Course  Procedures  MDM -135 bpm, mod var, present acel, no decels,  No contractions, toco is quiescent -FFN is negative Cervix is long, closed, middle position. Patient not rechecked. Palpated abdomen during what  patient described as a CTX, abdomen was soft, non-tender, non-rigid.  -patient declines vaginal cultures (STI, wet prep), is not concerned about STI or any vaginal infection.  -UA shows only mild dehydration  Assessment and Plan   1. Round ligament pain   -discussed comfort measures, discussed hydrotherapy, normal physiological changes in pregnancy.   -  keep upcoming appointment on Jan 20 -return precautions reviewed; all questions answered.   Mervyn Skeeters Kimanh Templeman 07/30/2020, 9:49 AM

## 2020-08-05 ENCOUNTER — Other Ambulatory Visit: Payer: Self-pay

## 2020-08-05 ENCOUNTER — Ambulatory Visit (INDEPENDENT_AMBULATORY_CARE_PROVIDER_SITE_OTHER): Payer: 59 | Admitting: Family Medicine

## 2020-08-05 VITALS — BP 128/80 | HR 103 | Wt 267.0 lb

## 2020-08-05 DIAGNOSIS — Z86711 Personal history of pulmonary embolism: Secondary | ICD-10-CM

## 2020-08-05 DIAGNOSIS — K219 Gastro-esophageal reflux disease without esophagitis: Secondary | ICD-10-CM

## 2020-08-05 DIAGNOSIS — O24119 Pre-existing diabetes mellitus, type 2, in pregnancy, unspecified trimester: Secondary | ICD-10-CM

## 2020-08-05 DIAGNOSIS — O0990 Supervision of high risk pregnancy, unspecified, unspecified trimester: Secondary | ICD-10-CM

## 2020-08-05 DIAGNOSIS — I10 Essential (primary) hypertension: Secondary | ICD-10-CM

## 2020-08-05 DIAGNOSIS — B009 Herpesviral infection, unspecified: Secondary | ICD-10-CM

## 2020-08-05 LAB — POCT URINALYSIS DIPSTICK OB
Bilirubin, UA: NEGATIVE
Blood, UA: NEGATIVE
Ketones, UA: NEGATIVE
Leukocytes, UA: NEGATIVE
Nitrite, UA: NEGATIVE
Spec Grav, UA: 1.01
Urobilinogen, UA: 0.2 U/dL
pH, UA: 7

## 2020-08-05 NOTE — Progress Notes (Signed)
   PRENATAL VISIT NOTE  Subjective:  Danielle Garcia is a 30 y.o. G3P1011 at [redacted]w[redacted]d being seen today for ongoing prenatal care.  She is currently monitored for the following issues for this high-risk pregnancy and has HSV-2 (herpes simplex virus 2) infection; Other fatigue; Shortness of breath on exertion; Migraine; Essential hypertension; Back pain; Urinary frequency; Gastroesophageal reflux disease; Morbid obesity ([place]); Vitamin D deficiency; Supervision of high risk pregnancy, antepartum; Asthma; History of pulmonary embolism; Diabetes mellitus during pregnancy, antepartum; and Preterm labor in third trimester without delivery on their problem list.  Patient reports increasing pelvic pain at work. Having difficulty walking.  Contractions: Not present. Vag. Bleeding: None.  Movement: Present. Denies leaking of fluid.   The following portions of the patient's history were reviewed and updated as appropriate: allergies, current medications, past family history, past medical history, past social history, past surgical history and problem list.   Objective:   Vitals:   08/05/20 0833  BP: 128/80  Pulse: (!) 103    Fetal Status: Fetal Heart Rate (bpm): 131   Movement: Present     General:  Alert, oriented and cooperative. Patient is in no acute distress.  Skin: Skin is warm and dry. No rash noted.   Cardiovascular: Normal heart rate noted  Respiratory: Normal respiratory effort, no problems with respiration noted  Abdomen: Soft, gravid, appropriate for gestational age.  Pain/Pressure: Present     Pelvic: Cervical exam deferred        Extremities: Normal range of motion.  Edema: None  Mental Status: Normal mood and affect. Normal behavior. Normal judgment and thought content.   Assessment and Plan:  Pregnancy: G3P1011 at [redacted]w[redacted]d 1. Supervision of high risk pregnancy, antepartum FHT normal  2. Pre-existing type 2 diabetes mellitus during pregnancy, antepartum Hasn't been checking CBGs this  week.  Taking metformin. Will restart checking On ASA 81mg  Antenatal testing EFW 80% with AC at 86%  3. HSV-2 (herpes simplex virus 2) infection Start valtrex at 21 weeks  4. Gastroesophageal reflux disease, unspecified whether esophagitis present  5. Essential hypertension Not currently on an antihypertensive BP controlled On ASA 81mg    6. History of pulmonary embolism On Lovenox  7. Preterm labor in third trimester without delivery Not on makena  Preterm labor symptoms and general obstetric precautions including but not limited to vaginal bleeding, contractions, leaking of fluid and fetal movement were reviewed in detail with the patient. Please refer to After Visit Summary for other counseling recommendations.   Return in about 2 weeks (around 08/19/2020) for OB f/u, In Office.  Future Appointments  Date Time Provider [place]  08/09/2020  8:30 AM [facility] NURSE [facility]  08/09/2020  8:45 AM [facility] US4 [facility]  08/13/2020  9:45 AM Truett Mainland, DO [facility] None  08/13/2020 10:00 AM Bauert, Nicolasa Ducking, LCSW [facility] None  08/16/2020  8:30 AM [facility] NURSE [facility]  08/16/2020  8:45 AM [facility]  08/23/2020  8:30 AM [facility] NURSE [facility]  08/23/2020  8:45 AM [facility] US4 [facility]    Truett Mainland, DO

## 2020-08-05 NOTE — Addendum Note (Signed)
Addended by: Wendelyn Breslow L on: 08/05/2020 09:07 AM   Modules accepted: Orders

## 2020-08-06 ENCOUNTER — Telehealth: Payer: Self-pay

## 2020-08-06 DIAGNOSIS — O10919 Unspecified pre-existing hypertension complicating pregnancy, unspecified trimester: Secondary | ICD-10-CM | POA: Diagnosis not present

## 2020-08-06 LAB — COMPREHENSIVE METABOLIC PANEL
ALT: 7 IU/L (ref 0–32)
AST: 15 IU/L (ref 0–40)
Albumin/Globulin Ratio: 1.3 (ref 1.2–2.2)
Albumin: 3.7 g/dL — ABNORMAL LOW (ref 3.9–5.0)
Alkaline Phosphatase: 84 IU/L (ref 44–121)
BUN/Creatinine Ratio: 14 (ref 9–23)
BUN: 8 mg/dL (ref 6–20)
Bilirubin Total: 0.3 mg/dL (ref 0.0–1.2)
CO2: 19 mmol/L — ABNORMAL LOW (ref 20–29)
Calcium: 9.9 mg/dL (ref 8.7–10.2)
Chloride: 102 mmol/L (ref 96–106)
Creatinine, Ser: 0.56 mg/dL — ABNORMAL LOW (ref 0.57–1.00)
GFR calc Af Amer: 145 mL/min/{1.73_m2} (ref >59)
GFR calc non Af Amer: 126 mL/min/{1.73_m2} (ref >59)
Globulin, Total: 2.8 g/dL (ref 1.5–4.5)
Glucose: 118 mg/dL — ABNORMAL HIGH (ref 65–99)
Potassium: 4 mmol/L (ref 3.5–5.2)
Sodium: 136 mmol/L (ref 134–144)
Total Protein: 6.5 g/dL (ref 6.0–8.5)

## 2020-08-06 NOTE — Telephone Encounter (Signed)
New letter created for patient to be on light duty at work per Dr. Nehemiah Settle. Kathrene Alu RN

## 2020-08-09 ENCOUNTER — Ambulatory Visit: Payer: 59 | Admitting: *Deleted

## 2020-08-09 ENCOUNTER — Ambulatory Visit: Payer: 59 | Attending: Obstetrics and Gynecology

## 2020-08-09 ENCOUNTER — Encounter: Payer: Self-pay | Admitting: *Deleted

## 2020-08-09 ENCOUNTER — Other Ambulatory Visit: Payer: Self-pay

## 2020-08-09 ENCOUNTER — Other Ambulatory Visit: Payer: Self-pay | Admitting: *Deleted

## 2020-08-09 VITALS — BP 123/85 | HR 99

## 2020-08-09 DIAGNOSIS — O24415 Gestational diabetes mellitus in pregnancy, controlled by oral hypoglycemic drugs: Secondary | ICD-10-CM

## 2020-08-09 DIAGNOSIS — O24419 Gestational diabetes mellitus in pregnancy, unspecified control: Secondary | ICD-10-CM

## 2020-08-09 DIAGNOSIS — O34219 Maternal care for unspecified type scar from previous cesarean delivery: Secondary | ICD-10-CM

## 2020-08-09 DIAGNOSIS — D259 Leiomyoma of uterus, unspecified: Secondary | ICD-10-CM

## 2020-08-09 DIAGNOSIS — O99513 Diseases of the respiratory system complicating pregnancy, third trimester: Secondary | ICD-10-CM | POA: Diagnosis not present

## 2020-08-09 DIAGNOSIS — Z7901 Long term (current) use of anticoagulants: Secondary | ICD-10-CM

## 2020-08-09 DIAGNOSIS — O99213 Obesity complicating pregnancy, third trimester: Secondary | ICD-10-CM

## 2020-08-09 DIAGNOSIS — Z86718 Personal history of other venous thrombosis and embolism: Secondary | ICD-10-CM

## 2020-08-09 DIAGNOSIS — Z3A32 32 weeks gestation of pregnancy: Secondary | ICD-10-CM

## 2020-08-09 DIAGNOSIS — O3413 Maternal care for benign tumor of corpus uteri, third trimester: Secondary | ICD-10-CM | POA: Diagnosis not present

## 2020-08-09 DIAGNOSIS — J45909 Unspecified asthma, uncomplicated: Secondary | ICD-10-CM

## 2020-08-09 DIAGNOSIS — Z362 Encounter for other antenatal screening follow-up: Secondary | ICD-10-CM

## 2020-08-09 DIAGNOSIS — O99343 Other mental disorders complicating pregnancy, third trimester: Secondary | ICD-10-CM

## 2020-08-09 DIAGNOSIS — O10013 Pre-existing essential hypertension complicating pregnancy, third trimester: Secondary | ICD-10-CM | POA: Diagnosis not present

## 2020-08-09 DIAGNOSIS — F419 Anxiety disorder, unspecified: Secondary | ICD-10-CM | POA: Diagnosis not present

## 2020-08-13 ENCOUNTER — Encounter: Payer: 59 | Admitting: Family Medicine

## 2020-08-13 ENCOUNTER — Ambulatory Visit: Payer: 59 | Admitting: Psychology

## 2020-08-16 ENCOUNTER — Other Ambulatory Visit: Payer: Self-pay

## 2020-08-16 ENCOUNTER — Ambulatory Visit: Payer: 59 | Admitting: *Deleted

## 2020-08-16 ENCOUNTER — Encounter: Payer: Self-pay | Admitting: *Deleted

## 2020-08-16 ENCOUNTER — Ambulatory Visit: Payer: 59 | Attending: Obstetrics and Gynecology

## 2020-08-16 VITALS — BP 109/63 | HR 97

## 2020-08-16 DIAGNOSIS — Z362 Encounter for other antenatal screening follow-up: Secondary | ICD-10-CM | POA: Diagnosis not present

## 2020-08-16 DIAGNOSIS — O99213 Obesity complicating pregnancy, third trimester: Secondary | ICD-10-CM | POA: Diagnosis not present

## 2020-08-16 DIAGNOSIS — E669 Obesity, unspecified: Secondary | ICD-10-CM

## 2020-08-16 DIAGNOSIS — O24415 Gestational diabetes mellitus in pregnancy, controlled by oral hypoglycemic drugs: Secondary | ICD-10-CM | POA: Diagnosis not present

## 2020-08-16 DIAGNOSIS — O34219 Maternal care for unspecified type scar from previous cesarean delivery: Secondary | ICD-10-CM | POA: Diagnosis not present

## 2020-08-16 DIAGNOSIS — O99343 Other mental disorders complicating pregnancy, third trimester: Secondary | ICD-10-CM | POA: Diagnosis not present

## 2020-08-16 DIAGNOSIS — F419 Anxiety disorder, unspecified: Secondary | ICD-10-CM

## 2020-08-16 DIAGNOSIS — O2693 Pregnancy related conditions, unspecified, third trimester: Secondary | ICD-10-CM | POA: Diagnosis not present

## 2020-08-16 DIAGNOSIS — O3410 Maternal care for benign tumor of corpus uteri, unspecified trimester: Secondary | ICD-10-CM

## 2020-08-16 DIAGNOSIS — O99513 Diseases of the respiratory system complicating pregnancy, third trimester: Secondary | ICD-10-CM | POA: Diagnosis not present

## 2020-08-16 DIAGNOSIS — D259 Leiomyoma of uterus, unspecified: Secondary | ICD-10-CM

## 2020-08-16 DIAGNOSIS — O10913 Unspecified pre-existing hypertension complicating pregnancy, third trimester: Secondary | ICD-10-CM

## 2020-08-16 DIAGNOSIS — Z86711 Personal history of pulmonary embolism: Secondary | ICD-10-CM

## 2020-08-16 DIAGNOSIS — Z3A33 33 weeks gestation of pregnancy: Secondary | ICD-10-CM

## 2020-08-16 DIAGNOSIS — J45909 Unspecified asthma, uncomplicated: Secondary | ICD-10-CM

## 2020-08-17 ENCOUNTER — Ambulatory Visit (INDEPENDENT_AMBULATORY_CARE_PROVIDER_SITE_OTHER): Payer: 59 | Admitting: Obstetrics & Gynecology

## 2020-08-17 ENCOUNTER — Other Ambulatory Visit: Payer: Self-pay | Admitting: Family Medicine

## 2020-08-17 ENCOUNTER — Other Ambulatory Visit: Payer: Self-pay | Admitting: Obstetrics & Gynecology

## 2020-08-17 VITALS — BP 114/80 | HR 95 | Wt 271.0 lb

## 2020-08-17 DIAGNOSIS — O3663X0 Maternal care for excessive fetal growth, third trimester, not applicable or unspecified: Secondary | ICD-10-CM

## 2020-08-17 DIAGNOSIS — O0990 Supervision of high risk pregnancy, unspecified, unspecified trimester: Secondary | ICD-10-CM

## 2020-08-17 DIAGNOSIS — B009 Herpesviral infection, unspecified: Secondary | ICD-10-CM

## 2020-08-17 DIAGNOSIS — O24119 Pre-existing diabetes mellitus, type 2, in pregnancy, unspecified trimester: Secondary | ICD-10-CM

## 2020-08-17 DIAGNOSIS — Z86711 Personal history of pulmonary embolism: Secondary | ICD-10-CM

## 2020-08-17 DIAGNOSIS — O24919 Unspecified diabetes mellitus in pregnancy, unspecified trimester: Secondary | ICD-10-CM

## 2020-08-17 DIAGNOSIS — O10919 Unspecified pre-existing hypertension complicating pregnancy, unspecified trimester: Secondary | ICD-10-CM

## 2020-08-17 DIAGNOSIS — Z3A34 34 weeks gestation of pregnancy: Secondary | ICD-10-CM

## 2020-08-17 MED ORDER — VALACYCLOVIR HCL 500 MG PO TABS: 500.0000 mg | ORAL_TABLET | Freq: Two times a day (BID) | ORAL | 1 refills | Status: DC

## 2020-08-17 MED ORDER — METFORMIN HCL 500 MG PO TABS: 500.0000 mg | ORAL_TABLET | Freq: Two times a day (BID) | ORAL | 1 refills | Status: DC

## 2020-08-17 MED FILL — METFORMIN HCL 500 MG TABS: 500 | 30 days supply | Qty: 60 | Fill #0

## 2020-08-17 MED FILL — VALACYCLOVIR HCL 500 MG TAB: 500 | 30 days supply | Qty: 60 | Fill #0

## 2020-08-17 MED FILL — FREESTYLE LANCETS: 25 days supply | Qty: 100 | Fill #0

## 2020-08-17 MED FILL — FREESTYLE LITE TEST STRIP: 25 days supply | Qty: 100 | Fill #0

## 2020-08-17 NOTE — Progress Notes (Signed)
PRENATAL VISIT NOTE  Subjective:  Danielle Garcia is a 30 y.o. G3P1011 at [redacted]w[redacted]d being seen today for ongoing prenatal care.  She is currently monitored for the following issues for this high-risk pregnancy and has HSV-2 (herpes simplex virus 2) infection; Other fatigue; Shortness of breath on exertion; Migraine; Essential hypertension; Back pain; Urinary frequency; Gastroesophageal reflux disease; Morbid obesity ([place]); Vitamin D deficiency; Supervision of high risk pregnancy, antepartum; Asthma; History of pulmonary embolism; Diabetes mellitus during pregnancy, antepartum; and Preterm labor in third trimester without delivery on their problem list.  Patient reports occasional contractions and some increased pressure.  Contractions: Irritability. Vag. Bleeding: None.  Movement: Present. Denies leaking of fluid.   Brought blood sugar values with her.  Fasting BSs 96-108.  2hr PP breakfast 96- 140 (the 140 is an outlier) 2h PP lunch 102-130.  She admits she is not checking after dinner blood sugars.  Last growth scan 08/09/2020 with EFW 2693 grams, 98%  The following portions of the patient's history were reviewed and updated as appropriate: allergies, current medications, past family history, past medical history, past social history, past surgical history and problem list.   Objective:   Vitals:   08/17/20 1613  BP: 114/80  Pulse: 95  Weight: 271 lb (122.9 kg)    Fetal Status: Fetal Heart Rate (bpm): 140 Fundal Height: 39 cm Movement: Present     General:  Alert, oriented and cooperative. Patient is in no acute distress.  Skin: Skin is warm and dry. No rash noted.   Cardiovascular: Normal heart rate noted  Respiratory: Normal respiratory effort, no problems with respiration noted  Abdomen: Soft, gravid, appropriate for gestational age.  Pain/Pressure: Present     Pelvic: Cervical exam deferred        Extremities: Normal range of motion.  Edema: Trace  Mental Status: Normal mood and  affect. Normal behavior. Normal judgment and thought content.   Assessment and Plan:  Pregnancy: G3P1011 at [redacted]w[redacted]d 1. Supervision of high risk pregnancy, antepartum -on ASA and baby ASA - having weekly BPPs -Growth scan scheduled for 09/06/2020 - release for c/s from 2011 signed today.  - Pt is currently desirous of VBAC which she knows might not be recommended due to size of baby.  Consent reviewed including risks of uterine rupture, fetal neurologic injury, as well as fetal and maternal death  2. [redacted] weeks gestation of pregnancy - return 2 weeks.  Will need cultures then.  3. Pre-existing type 2 diabetes mellitus during pregnancy, antepartum - on metformin 500mg  bid and keeping track of blood sugars - metFORMIN (GLUCOPHAGE) 500 MG tablet; Take 1 tablet (500 mg total) by mouth 2 (two) times daily with a meal.  Dispense: 60 tablet; Refill: 1  4. Preexisting hypertension complicating pregnancy, antepartum -BPs under good control  5. HSV-2 (herpes simplex virus 2) infection - valACYclovir (VALTREX) 500 MG tablet; Take 1 tablet (500 mg total) by mouth 2 (two) times daily. Take 1 tab daily for 5 days during outbreaks.  Dispense: 60 tablet; Refill: 1  7. History of pulmonary embolism - on Lovenox  8. Excessive fetal growth affecting management of pregnancy in third trimester, single or unspecified fetus - reviewed with pt   Preterm labor symptoms and general obstetric precautions including but not limited to vaginal bleeding, contractions, leaking of fluid and fetal movement were reviewed in detail with the patient. Please refer to After Visit Summary for other counseling recommendations.   Return in about 2 weeks (around 08/31/2020) for Office  Ob visit (MD only), GC/Chl/GBS.  Future Appointments  Date Time Provider [place]  08/23/2020  8:30 AM [facility] NURSE [facility]  08/23/2020  8:45 AM [facility] US4 [facility]  08/30/2020  7:45 AM [facility] NURSE [facility]  08/30/2020  8:00  AM [facility] US1 [facility]  09/02/2020  8:45 AM Truett Mainland, DO [facility] None  09/06/2020  8:30 AM [facility] NURSE [facility]  09/06/2020  8:45 AM [facility] US4 [facility]  09/10/2020  9:00 AM Lavonia Drafts, MD [facility] None    Megan Salon, MD

## 2020-08-17 NOTE — Progress Notes (Signed)
Sent refill on test strips and lancets to patient pharmacy. Kathrene Alu RN

## 2020-08-23 ENCOUNTER — Ambulatory Visit: Payer: 59 | Admitting: *Deleted

## 2020-08-23 ENCOUNTER — Encounter: Payer: Self-pay | Admitting: *Deleted

## 2020-08-23 ENCOUNTER — Ambulatory Visit: Payer: 59 | Attending: Obstetrics and Gynecology

## 2020-08-23 ENCOUNTER — Other Ambulatory Visit: Payer: Self-pay

## 2020-08-23 VITALS — BP 128/67 | HR 93

## 2020-08-23 DIAGNOSIS — D259 Leiomyoma of uterus, unspecified: Secondary | ICD-10-CM

## 2020-08-23 DIAGNOSIS — O2693 Pregnancy related conditions, unspecified, third trimester: Secondary | ICD-10-CM | POA: Diagnosis not present

## 2020-08-23 DIAGNOSIS — Z8759 Personal history of other complications of pregnancy, childbirth and the puerperium: Secondary | ICD-10-CM | POA: Diagnosis not present

## 2020-08-23 DIAGNOSIS — J45909 Unspecified asthma, uncomplicated: Secondary | ICD-10-CM

## 2020-08-23 DIAGNOSIS — O10013 Pre-existing essential hypertension complicating pregnancy, third trimester: Secondary | ICD-10-CM | POA: Diagnosis not present

## 2020-08-23 DIAGNOSIS — O99513 Diseases of the respiratory system complicating pregnancy, third trimester: Secondary | ICD-10-CM

## 2020-08-23 DIAGNOSIS — O3413 Maternal care for benign tumor of corpus uteri, third trimester: Secondary | ICD-10-CM | POA: Diagnosis not present

## 2020-08-23 DIAGNOSIS — O24415 Gestational diabetes mellitus in pregnancy, controlled by oral hypoglycemic drugs: Secondary | ICD-10-CM | POA: Diagnosis not present

## 2020-08-23 DIAGNOSIS — O34219 Maternal care for unspecified type scar from previous cesarean delivery: Secondary | ICD-10-CM

## 2020-08-23 DIAGNOSIS — O99213 Obesity complicating pregnancy, third trimester: Secondary | ICD-10-CM

## 2020-08-23 DIAGNOSIS — Z362 Encounter for other antenatal screening follow-up: Secondary | ICD-10-CM

## 2020-08-23 DIAGNOSIS — Z3A34 34 weeks gestation of pregnancy: Secondary | ICD-10-CM

## 2020-08-25 MED FILL — ASPIRIN ADULT LOW STRENGTH: 81 | 30 days supply | Qty: 30 | Fill #2

## 2020-08-25 MED FILL — ENOXAPARIN SODIUM 120 MG/0.: 120 | 30 days supply | Qty: 24 | Fill #2

## 2020-08-30 ENCOUNTER — Ambulatory Visit: Payer: 59 | Admitting: *Deleted

## 2020-08-30 ENCOUNTER — Other Ambulatory Visit: Payer: Self-pay

## 2020-08-30 ENCOUNTER — Encounter: Payer: Self-pay | Admitting: *Deleted

## 2020-08-30 ENCOUNTER — Ambulatory Visit: Payer: 59 | Attending: Obstetrics and Gynecology

## 2020-08-30 VITALS — BP 119/67 | HR 77

## 2020-08-30 DIAGNOSIS — O99213 Obesity complicating pregnancy, third trimester: Secondary | ICD-10-CM

## 2020-08-30 DIAGNOSIS — O3413 Maternal care for benign tumor of corpus uteri, third trimester: Secondary | ICD-10-CM

## 2020-08-30 DIAGNOSIS — Z3A35 35 weeks gestation of pregnancy: Secondary | ICD-10-CM

## 2020-08-30 DIAGNOSIS — J45909 Unspecified asthma, uncomplicated: Secondary | ICD-10-CM | POA: Diagnosis not present

## 2020-08-30 DIAGNOSIS — O10013 Pre-existing essential hypertension complicating pregnancy, third trimester: Secondary | ICD-10-CM

## 2020-08-30 DIAGNOSIS — F419 Anxiety disorder, unspecified: Secondary | ICD-10-CM

## 2020-08-30 DIAGNOSIS — E669 Obesity, unspecified: Secondary | ICD-10-CM

## 2020-08-30 DIAGNOSIS — O24415 Gestational diabetes mellitus in pregnancy, controlled by oral hypoglycemic drugs: Secondary | ICD-10-CM | POA: Insufficient documentation

## 2020-08-30 DIAGNOSIS — O99343 Other mental disorders complicating pregnancy, third trimester: Secondary | ICD-10-CM

## 2020-08-30 DIAGNOSIS — O99513 Diseases of the respiratory system complicating pregnancy, third trimester: Secondary | ICD-10-CM | POA: Diagnosis not present

## 2020-08-30 DIAGNOSIS — O09293 Supervision of pregnancy with other poor reproductive or obstetric history, third trimester: Secondary | ICD-10-CM

## 2020-08-30 DIAGNOSIS — O34219 Maternal care for unspecified type scar from previous cesarean delivery: Secondary | ICD-10-CM

## 2020-08-30 DIAGNOSIS — Z86711 Personal history of pulmonary embolism: Secondary | ICD-10-CM | POA: Diagnosis not present

## 2020-08-30 DIAGNOSIS — D259 Leiomyoma of uterus, unspecified: Secondary | ICD-10-CM

## 2020-09-02 ENCOUNTER — Ambulatory Visit (INDEPENDENT_AMBULATORY_CARE_PROVIDER_SITE_OTHER): Payer: 59 | Admitting: Family Medicine

## 2020-09-02 ENCOUNTER — Telehealth (HOSPITAL_COMMUNITY): Payer: Self-pay | Admitting: *Deleted

## 2020-09-02 ENCOUNTER — Other Ambulatory Visit: Payer: Self-pay

## 2020-09-02 ENCOUNTER — Other Ambulatory Visit (HOSPITAL_COMMUNITY)
Admission: RE | Admit: 2020-09-02 | Discharge: 2020-09-02 | Disposition: A | Discharge status: Home / Self Care | Payer: 59 | Source: Ambulatory Visit | Attending: Family Medicine | Admitting: Family Medicine

## 2020-09-02 VITALS — BP 124/79 | HR 100 | Wt 277.0 lb

## 2020-09-02 DIAGNOSIS — Z3A36 36 weeks gestation of pregnancy: Secondary | ICD-10-CM

## 2020-09-02 DIAGNOSIS — Z86711 Personal history of pulmonary embolism: Secondary | ICD-10-CM

## 2020-09-02 DIAGNOSIS — O0993 Supervision of high risk pregnancy, unspecified, third trimester: Secondary | ICD-10-CM | POA: Insufficient documentation

## 2020-09-02 DIAGNOSIS — O0990 Supervision of high risk pregnancy, unspecified, unspecified trimester: Secondary | ICD-10-CM | POA: Diagnosis not present

## 2020-09-02 DIAGNOSIS — I10 Essential (primary) hypertension: Secondary | ICD-10-CM

## 2020-09-02 DIAGNOSIS — B009 Herpesviral infection, unspecified: Secondary | ICD-10-CM

## 2020-09-02 DIAGNOSIS — O24119 Pre-existing diabetes mellitus, type 2, in pregnancy, unspecified trimester: Secondary | ICD-10-CM

## 2020-09-02 NOTE — Telephone Encounter (Signed)
Preadmission screen  

## 2020-09-02 NOTE — Progress Notes (Signed)
   PRENATAL VISIT NOTE  Subjective:  Danielle Garcia is a 30 y.o. G3P1011 at [redacted]w[redacted]d being seen today for ongoing prenatal care.  She is currently monitored for the following issues for this high-risk pregnancy and has HSV-2 (herpes simplex virus 2) infection; Other fatigue; Shortness of breath on exertion; Migraine; Essential hypertension; Back pain; Urinary frequency; Gastroesophageal reflux disease; Morbid obesity ([place]); Vitamin D deficiency; Supervision of high risk pregnancy, antepartum; Asthma; History of pulmonary embolism; Diabetes mellitus during pregnancy, antepartum; Preterm labor in third trimester without delivery; and Excessive fetal growth affecting management of mother in third trimester, antepartum on their problem list.  Patient reports no complaints.  Contractions: Irritability. Vag. Bleeding: None.  Movement: Present. Denies leaking of fluid.   The following portions of the patient's history were reviewed and updated as appropriate: allergies, current medications, past family history, past medical history, past social history, past surgical history and problem list.   Objective:   Vitals:   09/02/20 0816  BP: 124/79  Pulse: 100  Weight: 277 lb (125.6 kg)    Fetal Status: Fetal Heart Rate (bpm): 135 Fundal Height: 36 cm Movement: Present  Presentation: Vertex  General:  Alert, oriented and cooperative. Patient is in no acute distress.  Skin: Skin is warm and dry. No rash noted.   Cardiovascular: Normal heart rate noted  Respiratory: Normal respiratory effort, no problems with respiration noted  Abdomen: Soft, gravid, appropriate for gestational age.  Pain/Pressure: Present     Pelvic: Cervical exam deferred Dilation: Fingertip Effacement (%): 20 Station: Ballotable  Extremities: Normal range of motion.  Edema: None  Mental Status: Normal mood and affect. Normal behavior. Normal judgment and thought content.   Assessment and Plan:  Pregnancy: G3P1011 at [redacted]w[redacted]d 1. [redacted]  weeks gestation of pregnancy - Culture, beta strep (group b only) - GC/Chlamydia probe amp ([place])not at [facility]  2. Supervision of high risk pregnancy, antepartum FHT and FH normal - Culture, beta strep (group b only) - GC/Chlamydia probe amp ()not at [facility]  3. History of pulmonary embolism Lovenox. No lovenox day before induction  4. HSV-2 (herpes simplex virus 2) infection On valtrex  5. Essential hypertension normal  6. Pre-existing type 2 diabetes mellitus during pregnancy, antepartum Due to complications of pregnancy and macrosomia, MFM recommended delivery between 37-38 weeks ([place] on 1/31) . Will schedule Due to previous c/s, will give low dose pitocin and foley balloon.  Preterm labor symptoms and general obstetric precautions including but not limited to vaginal bleeding, contractions, leaking of fluid and fetal movement were reviewed in detail with the patient. Please refer to After Visit Summary for other counseling recommendations.   No follow-ups on file.  Future Appointments  Date Time Provider [place]  09/04/2020 10:20 AM [facility]ENING [facility] None  09/06/2020  8:30 AM [facility] NURSE [facility]  09/06/2020  8:45 AM [facility] US4 [facility]  09/07/2020 12:00 AM [facility]-LD SCHED ROOM [facility]INDC None  10/14/2020 10:30 AM Truett Mainland, DO [facility] None    Truett Mainland, DO

## 2020-09-03 ENCOUNTER — Telehealth (HOSPITAL_COMMUNITY): Payer: Self-pay | Admitting: *Deleted

## 2020-09-03 ENCOUNTER — Encounter (HOSPITAL_COMMUNITY): Payer: Self-pay | Admitting: *Deleted

## 2020-09-03 LAB — GC/CHLAMYDIA PROBE AMP (~~LOC~~) NOT AT ARMC
Chlamydia: NEGATIVE
Comment: NEGATIVE
Comment: NORMAL
Neisseria Gonorrhea: NEGATIVE

## 2020-09-03 NOTE — Telephone Encounter (Signed)
Preadmission screen  

## 2020-09-04 ENCOUNTER — Other Ambulatory Visit (HOSPITAL_COMMUNITY)
Admission: RE | Admit: 2020-09-04 | Discharge: 2020-09-04 | Disposition: A | Discharge status: Home / Self Care | Payer: 59 | Source: Ambulatory Visit | Attending: Family Medicine | Admitting: Family Medicine

## 2020-09-04 ENCOUNTER — Other Ambulatory Visit: Payer: Self-pay | Admitting: Advanced Practice Midwife

## 2020-09-04 DIAGNOSIS — O24415 Gestational diabetes mellitus in pregnancy, controlled by oral hypoglycemic drugs: Secondary | ICD-10-CM | POA: Diagnosis not present

## 2020-09-04 DIAGNOSIS — O99513 Diseases of the respiratory system complicating pregnancy, third trimester: Secondary | ICD-10-CM | POA: Diagnosis not present

## 2020-09-04 DIAGNOSIS — J45909 Unspecified asthma, uncomplicated: Secondary | ICD-10-CM | POA: Diagnosis not present

## 2020-09-04 DIAGNOSIS — D6859 Other primary thrombophilia: Secondary | ICD-10-CM | POA: Diagnosis not present

## 2020-09-04 DIAGNOSIS — E119 Type 2 diabetes mellitus without complications: Secondary | ICD-10-CM | POA: Diagnosis not present

## 2020-09-04 DIAGNOSIS — O2412 Pre-existing diabetes mellitus, type 2, in childbirth: Secondary | ICD-10-CM | POA: Diagnosis not present

## 2020-09-04 DIAGNOSIS — O99213 Obesity complicating pregnancy, third trimester: Secondary | ICD-10-CM | POA: Diagnosis not present

## 2020-09-04 DIAGNOSIS — D259 Leiomyoma of uterus, unspecified: Secondary | ICD-10-CM | POA: Diagnosis not present

## 2020-09-04 DIAGNOSIS — Z20822 Contact with and (suspected) exposure to covid-19: Secondary | ICD-10-CM | POA: Insufficient documentation

## 2020-09-04 DIAGNOSIS — Z01812 Encounter for preprocedural laboratory examination: Secondary | ICD-10-CM | POA: Insufficient documentation

## 2020-09-04 DIAGNOSIS — O3413 Maternal care for benign tumor of corpus uteri, third trimester: Secondary | ICD-10-CM | POA: Diagnosis not present

## 2020-09-04 DIAGNOSIS — O34219 Maternal care for unspecified type scar from previous cesarean delivery: Secondary | ICD-10-CM | POA: Diagnosis not present

## 2020-09-04 DIAGNOSIS — O99343 Other mental disorders complicating pregnancy, third trimester: Secondary | ICD-10-CM | POA: Diagnosis not present

## 2020-09-04 DIAGNOSIS — O9832 Other infections with a predominantly sexual mode of transmission complicating childbirth: Secondary | ICD-10-CM | POA: Diagnosis not present

## 2020-09-04 DIAGNOSIS — O9912 Other diseases of the blood and blood-forming organs and certain disorders involving the immune mechanism complicating childbirth: Secondary | ICD-10-CM | POA: Diagnosis not present

## 2020-09-04 DIAGNOSIS — O10013 Pre-existing essential hypertension complicating pregnancy, third trimester: Secondary | ICD-10-CM | POA: Diagnosis not present

## 2020-09-04 DIAGNOSIS — O1002 Pre-existing essential hypertension complicating childbirth: Secondary | ICD-10-CM | POA: Diagnosis not present

## 2020-09-04 LAB — SARS CORONAVIRUS 2 (TAT 6-24 HRS): SARS Coronavirus 2: NEGATIVE

## 2020-09-05 LAB — CULTURE, BETA STREP (GROUP B ONLY): Strep Gp B Culture: POSITIVE — AB

## 2020-09-06 ENCOUNTER — Other Ambulatory Visit: Payer: Self-pay

## 2020-09-06 ENCOUNTER — Ambulatory Visit (HOSPITAL_BASED_OUTPATIENT_CLINIC_OR_DEPARTMENT_OTHER): Payer: 59

## 2020-09-06 ENCOUNTER — Ambulatory Visit: Payer: 59 | Admitting: *Deleted

## 2020-09-06 ENCOUNTER — Encounter: Payer: Self-pay | Admitting: *Deleted

## 2020-09-06 ENCOUNTER — Telehealth: Payer: Self-pay

## 2020-09-06 ENCOUNTER — Encounter: Payer: Self-pay | Admitting: Family Medicine

## 2020-09-06 VITALS — BP 118/80 | HR 99

## 2020-09-06 DIAGNOSIS — O24415 Gestational diabetes mellitus in pregnancy, controlled by oral hypoglycemic drugs: Secondary | ICD-10-CM | POA: Insufficient documentation

## 2020-09-06 DIAGNOSIS — O99343 Other mental disorders complicating pregnancy, third trimester: Secondary | ICD-10-CM | POA: Diagnosis not present

## 2020-09-06 DIAGNOSIS — D259 Leiomyoma of uterus, unspecified: Secondary | ICD-10-CM

## 2020-09-06 DIAGNOSIS — J45909 Unspecified asthma, uncomplicated: Secondary | ICD-10-CM | POA: Diagnosis not present

## 2020-09-06 DIAGNOSIS — O99213 Obesity complicating pregnancy, third trimester: Secondary | ICD-10-CM | POA: Diagnosis not present

## 2020-09-06 DIAGNOSIS — O3413 Maternal care for benign tumor of corpus uteri, third trimester: Secondary | ICD-10-CM | POA: Diagnosis not present

## 2020-09-06 DIAGNOSIS — O34219 Maternal care for unspecified type scar from previous cesarean delivery: Secondary | ICD-10-CM | POA: Diagnosis not present

## 2020-09-06 DIAGNOSIS — O10013 Pre-existing essential hypertension complicating pregnancy, third trimester: Secondary | ICD-10-CM | POA: Diagnosis not present

## 2020-09-06 DIAGNOSIS — O99513 Diseases of the respiratory system complicating pregnancy, third trimester: Secondary | ICD-10-CM | POA: Diagnosis not present

## 2020-09-06 DIAGNOSIS — Z8759 Personal history of other complications of pregnancy, childbirth and the puerperium: Secondary | ICD-10-CM

## 2020-09-06 DIAGNOSIS — F419 Anxiety disorder, unspecified: Secondary | ICD-10-CM

## 2020-09-06 DIAGNOSIS — R8271 Bacteriuria: Secondary | ICD-10-CM | POA: Insufficient documentation

## 2020-09-06 DIAGNOSIS — Z3A36 36 weeks gestation of pregnancy: Secondary | ICD-10-CM

## 2020-09-06 NOTE — Telephone Encounter (Signed)
-----   Message from Maurine Minister, [place] sent at 09/06/2020 10:44 AM EST ----- Regarding: test results Would like a call back today with test results.

## 2020-09-06 NOTE — Telephone Encounter (Signed)
Called pt in regards to message left by pt wanting GBS results. Informed pt GBS results and advised pt she would be treated in labor. Pt verbalizes understanding.

## 2020-09-07 ENCOUNTER — Inpatient Hospital Stay (HOSPITAL_COMMUNITY)
Admission: AD | Admit: 2020-09-07 | Discharge: 2020-09-10 | DRG: 787 | Disposition: A | Discharge status: Home / Self Care | Payer: 59 | Attending: Family Medicine | Admitting: Family Medicine

## 2020-09-07 ENCOUNTER — Inpatient Hospital Stay (HOSPITAL_COMMUNITY): Payer: 59 | Admitting: Anesthesiology

## 2020-09-07 ENCOUNTER — Encounter (HOSPITAL_COMMUNITY)
Admission: AD | Disposition: A | Discharge status: Home / Self Care | Payer: Self-pay | Source: Home / Self Care | Attending: Family Medicine

## 2020-09-07 ENCOUNTER — Inpatient Hospital Stay (HOSPITAL_COMMUNITY): Payer: 59

## 2020-09-07 ENCOUNTER — Other Ambulatory Visit: Payer: Self-pay

## 2020-09-07 ENCOUNTER — Encounter (HOSPITAL_COMMUNITY): Payer: Self-pay | Admitting: Family Medicine

## 2020-09-07 DIAGNOSIS — Z3482 Encounter for supervision of other normal pregnancy, second trimester: Secondary | ICD-10-CM | POA: Diagnosis not present

## 2020-09-07 DIAGNOSIS — O99824 Streptococcus B carrier state complicating childbirth: Secondary | ICD-10-CM | POA: Diagnosis present

## 2020-09-07 DIAGNOSIS — Z20822 Contact with and (suspected) exposure to covid-19: Secondary | ICD-10-CM | POA: Diagnosis present

## 2020-09-07 DIAGNOSIS — O24919 Unspecified diabetes mellitus in pregnancy, unspecified trimester: Secondary | ICD-10-CM | POA: Diagnosis present

## 2020-09-07 DIAGNOSIS — O34219 Maternal care for unspecified type scar from previous cesarean delivery: Secondary | ICD-10-CM | POA: Diagnosis present

## 2020-09-07 DIAGNOSIS — O2412 Pre-existing diabetes mellitus, type 2, in childbirth: Principal | ICD-10-CM | POA: Diagnosis present

## 2020-09-07 DIAGNOSIS — O9832 Other infections with a predominantly sexual mode of transmission complicating childbirth: Secondary | ICD-10-CM | POA: Diagnosis present

## 2020-09-07 DIAGNOSIS — O3663X0 Maternal care for excessive fetal growth, third trimester, not applicable or unspecified: Secondary | ICD-10-CM | POA: Diagnosis present

## 2020-09-07 DIAGNOSIS — Z3A37 37 weeks gestation of pregnancy: Secondary | ICD-10-CM

## 2020-09-07 DIAGNOSIS — J45909 Unspecified asthma, uncomplicated: Secondary | ICD-10-CM | POA: Diagnosis present

## 2020-09-07 DIAGNOSIS — O99214 Obesity complicating childbirth: Secondary | ICD-10-CM | POA: Diagnosis present

## 2020-09-07 DIAGNOSIS — Z3483 Encounter for supervision of other normal pregnancy, third trimester: Secondary | ICD-10-CM | POA: Diagnosis not present

## 2020-09-07 DIAGNOSIS — Z86711 Personal history of pulmonary embolism: Secondary | ICD-10-CM | POA: Diagnosis not present

## 2020-09-07 DIAGNOSIS — E119 Type 2 diabetes mellitus without complications: Secondary | ICD-10-CM | POA: Diagnosis present

## 2020-09-07 DIAGNOSIS — I10 Essential (primary) hypertension: Secondary | ICD-10-CM | POA: Diagnosis present

## 2020-09-07 DIAGNOSIS — A6000 Herpesviral infection of urogenital system, unspecified: Secondary | ICD-10-CM | POA: Diagnosis present

## 2020-09-07 DIAGNOSIS — Z3490 Encounter for supervision of normal pregnancy, unspecified, unspecified trimester: Secondary | ICD-10-CM | POA: Diagnosis present

## 2020-09-07 DIAGNOSIS — O1002 Pre-existing essential hypertension complicating childbirth: Secondary | ICD-10-CM | POA: Diagnosis present

## 2020-09-07 DIAGNOSIS — Z7984 Long term (current) use of oral hypoglycemic drugs: Secondary | ICD-10-CM | POA: Diagnosis not present

## 2020-09-07 DIAGNOSIS — B009 Herpesviral infection, unspecified: Secondary | ICD-10-CM | POA: Diagnosis present

## 2020-09-07 DIAGNOSIS — O34211 Maternal care for low transverse scar from previous cesarean delivery: Secondary | ICD-10-CM | POA: Diagnosis present

## 2020-09-07 DIAGNOSIS — D6859 Other primary thrombophilia: Secondary | ICD-10-CM | POA: Diagnosis present

## 2020-09-07 DIAGNOSIS — O9912 Other diseases of the blood and blood-forming organs and certain disorders involving the immune mechanism complicating childbirth: Secondary | ICD-10-CM | POA: Diagnosis present

## 2020-09-07 LAB — GLUCOSE, CAPILLARY
Glucose-Capillary: 133 mg/dL — ABNORMAL HIGH (ref 70–99)
Glucose-Capillary: 139 mg/dL — ABNORMAL HIGH (ref 70–99)
Glucose-Capillary: 106 mg/dL — ABNORMAL HIGH (ref 70–99)
Glucose-Capillary: 104 mg/dL — ABNORMAL HIGH (ref 70–99)
Glucose-Capillary: 80 mg/dL (ref 70–99)
Glucose-Capillary: 73 mg/dL (ref 70–99)
Glucose-Capillary: 74 mg/dL (ref 70–99)
Glucose-Capillary: 151 mg/dL — ABNORMAL HIGH (ref 70–99)

## 2020-09-07 LAB — COMPREHENSIVE METABOLIC PANEL
ALT: 12 U/L (ref 0–44)
AST: 19 U/L (ref 15–41)
Albumin: 2.9 g/dL — ABNORMAL LOW (ref 3.5–5.0)
Alkaline Phosphatase: 111 U/L (ref 38–126)
Anion gap: 15 (ref 5–15)
BUN: 9 mg/dL (ref 6–20)
CO2: 17 mmol/L — ABNORMAL LOW (ref 22–32)
Calcium: 9.4 mg/dL (ref 8.9–10.3)
Chloride: 105 mmol/L (ref 98–111)
Creatinine, Ser: 0.71 mg/dL (ref 0.44–1.00)
GFR, Estimated: >60 mL/min (ref >60)
Glucose, Bld: 169 mg/dL — ABNORMAL HIGH (ref 70–99)
Potassium: 4.1 mmol/L (ref 3.5–5.1)
Sodium: 137 mmol/L (ref 135–145)
Total Bilirubin: 0.7 mg/dL (ref 0.3–1.2)
Total Protein: 6.3 g/dL — ABNORMAL LOW (ref 6.5–8.1)

## 2020-09-07 LAB — CBC
HCT: 35.1 % — ABNORMAL LOW (ref 36.0–46.0)
Hemoglobin: 12.1 g/dL (ref 12.0–15.0)
MCH: 30.3 pg (ref 26.0–34.0)
MCHC: 34.5 g/dL (ref 30.0–36.0)
MCV: 88 fL (ref 80.0–100.0)
Platelets: 295 10*3/uL (ref 150–400)
RBC: 3.99 MIL/uL (ref 3.87–5.11)
RDW: 13.2 % (ref 11.5–15.5)
WBC: 9.1 10*3/uL (ref 4.0–10.5)
nRBC: 0 % (ref 0.0–0.2)

## 2020-09-07 LAB — TYPE AND SCREEN
ABO/RH(D): A POS
Antibody Screen: NEGATIVE

## 2020-09-07 LAB — RPR: RPR Ser Ql: NONREACTIVE

## 2020-09-07 LAB — PROTEIN / CREATININE RATIO, URINE
Creatinine, Urine: 456.41 mg/dL
Protein Creatinine Ratio: 0.12 mg/mg{Cre} (ref 0.00–0.15)
Total Protein, Urine: 56 mg/dL

## 2020-09-07 SURGERY — Surgical Case
Anesthesia: Epidural

## 2020-09-07 MED ORDER — LACTATED RINGERS IV SOLN: INTRAVENOUS | Status: DC | PRN

## 2020-09-07 MED ORDER — TERBUTALINE SULFATE 1 MG/ML IJ SOLN: 0.2500 mg | Freq: Once | INTRAMUSCULAR | Status: DC | PRN

## 2020-09-07 MED ORDER — SODIUM CHLORIDE 0.9 % IV SOLN
INTRAVENOUS | Status: AC
  Filled 2020-09-07: qty 500

## 2020-09-07 MED ORDER — LIDOCAINE HCL (PF) 1 % IJ SOLN
INTRAMUSCULAR | Status: DC | PRN
  Administered 2020-09-07: 11 mL via EPIDURAL

## 2020-09-07 MED ORDER — SOD CITRATE-CITRIC ACID 500-334 MG/5ML PO SOLN
30.0000 mL | ORAL | Status: AC
  Administered 2020-09-07: 30 mL via ORAL

## 2020-09-07 MED ORDER — OXYTOCIN-SODIUM CHLORIDE 30-0.9 UT/500ML-% IV SOLN
1.0000 m[IU]/min | INTRAVENOUS | Status: DC
  Administered 2020-09-07: 2 m[IU]/min via INTRAVENOUS

## 2020-09-07 MED ORDER — LACTATED RINGERS IV SOLN: INTRAVENOUS | Status: DC

## 2020-09-07 MED ORDER — OXYTOCIN-SODIUM CHLORIDE 30-0.9 UT/500ML-% IV SOLN: 1.0000 m[IU]/min | INTRAVENOUS | Status: DC

## 2020-09-07 MED ORDER — SODIUM CHLORIDE 0.9 % IV SOLN
5.0000 10*6.[IU] | Freq: Once | INTRAVENOUS | Status: AC
  Administered 2020-09-07: 5 10*6.[IU] via INTRAVENOUS
  Filled 2020-09-07: qty 5

## 2020-09-07 MED ORDER — OXYCODONE-ACETAMINOPHEN 5-325 MG PO TABS: 1.0000 | ORAL_TABLET | ORAL | Status: DC | PRN

## 2020-09-07 MED ORDER — EPHEDRINE 5 MG/ML INJ: 10.0000 mg | INTRAVENOUS | Status: DC | PRN

## 2020-09-07 MED ORDER — MORPHINE SULFATE (PF) 10 MG/ML IV SOLN
INTRAVENOUS | Status: DC | PRN
  Administered 2020-09-07: 3 mg via EPIDURAL

## 2020-09-07 MED ORDER — MORPHINE SULFATE (PF) 0.5 MG/ML IJ SOLN
INTRAMUSCULAR | Status: AC
  Filled 2020-09-07: qty 10

## 2020-09-07 MED ORDER — LACTATED RINGERS IV SOLN: 500.0000 mL | INTRAVENOUS | Status: DC | PRN

## 2020-09-07 MED ORDER — SODIUM CHLORIDE 0.9 % IR SOLN
Status: DC | PRN
  Administered 2020-09-07 (×2): 1

## 2020-09-07 MED ORDER — OXYCODONE-ACETAMINOPHEN 5-325 MG PO TABS: 2.0000 | ORAL_TABLET | ORAL | Status: DC | PRN

## 2020-09-07 MED ORDER — ONDANSETRON HCL 4 MG/2ML IJ SOLN
INTRAMUSCULAR | Status: DC | PRN
  Administered 2020-09-07: 4 mg via INTRAVENOUS

## 2020-09-07 MED ORDER — FENTANYL-BUPIVACAINE-NACL 0.5-0.125-0.9 MG/250ML-% EP SOLN
12.0000 mL/h | EPIDURAL | Status: DC | PRN
  Administered 2020-09-07: 12 mL/h via EPIDURAL
  Filled 2020-09-07: qty 250

## 2020-09-07 MED ORDER — OXYTOCIN BOLUS FROM INFUSION: 333.0000 mL | Freq: Once | INTRAVENOUS | Status: DC

## 2020-09-07 MED ORDER — SODIUM CHLORIDE 0.9 % IV SOLN
500.0000 mg | INTRAVENOUS | Status: AC
  Administered 2020-09-07: 500 mg via INTRAVENOUS

## 2020-09-07 MED ORDER — SOD CITRATE-CITRIC ACID 500-334 MG/5ML PO SOLN
30.0000 mL | ORAL | Status: DC | PRN
  Administered 2020-09-07: 30 mL via ORAL
  Filled 2020-09-07 (×2): qty 15

## 2020-09-07 MED ORDER — PHENYLEPHRINE 40 MCG/ML (10ML) SYRINGE FOR IV PUSH (FOR BLOOD PRESSURE SUPPORT): 80.0000 ug | PREFILLED_SYRINGE | INTRAVENOUS | Status: DC | PRN

## 2020-09-07 MED ORDER — PENICILLIN G POT IN DEXTROSE 60000 UNIT/ML IV SOLN
3.0000 10*6.[IU] | INTRAVENOUS | Status: DC
  Administered 2020-09-07 (×5): 3 10*6.[IU] via INTRAVENOUS
  Filled 2020-09-07 (×5): qty 50

## 2020-09-07 MED ORDER — ONDANSETRON HCL 4 MG/2ML IJ SOLN
INTRAMUSCULAR | Status: AC
  Filled 2020-09-07: qty 2

## 2020-09-07 MED ORDER — INSULIN ASPART 100 UNIT/ML ~~LOC~~ SOLN
0.0000 [IU] | Freq: Four times a day (QID) | SUBCUTANEOUS | Status: DC
  Administered 2020-09-07: 3 [IU] via SUBCUTANEOUS
  Administered 2020-09-07: 2 [IU] via SUBCUTANEOUS

## 2020-09-07 MED ORDER — DIPHENHYDRAMINE HCL 50 MG/ML IJ SOLN: 12.5000 mg | INTRAMUSCULAR | Status: DC | PRN

## 2020-09-07 MED ORDER — ONDANSETRON HCL 4 MG/2ML IJ SOLN: 4.0000 mg | Freq: Four times a day (QID) | INTRAMUSCULAR | Status: DC | PRN

## 2020-09-07 MED ORDER — LIDOCAINE-EPINEPHRINE (PF) 2 %-1:200000 IJ SOLN
INTRAMUSCULAR | Status: DC | PRN
  Administered 2020-09-07: 5 mL via EPIDURAL
  Administered 2020-09-07: 10 mL via EPIDURAL
  Administered 2020-09-07: 5 mL via EPIDURAL

## 2020-09-07 MED ORDER — LACTATED RINGERS IV SOLN
500.0000 mL | Freq: Once | INTRAVENOUS | Status: AC
  Administered 2020-09-07: 500 mL via INTRAVENOUS

## 2020-09-07 MED ORDER — LIDOCAINE HCL (PF) 1 % IJ SOLN: 30.0000 mL | INTRAMUSCULAR | Status: DC | PRN

## 2020-09-07 MED ORDER — PHENYLEPHRINE HCL (PRESSORS) 10 MG/ML IV SOLN
INTRAVENOUS | Status: DC | PRN
  Administered 2020-09-07 (×3): 40 ug via INTRAVENOUS
  Administered 2020-09-07: 120 ug via INTRAVENOUS
  Administered 2020-09-07: 80 ug via INTRAVENOUS

## 2020-09-07 MED ORDER — INSULIN ASPART 100 UNIT/ML ~~LOC~~ SOLN: 0.0000 [IU] | Freq: Three times a day (TID) | SUBCUTANEOUS | Status: DC

## 2020-09-07 MED ORDER — OXYTOCIN-SODIUM CHLORIDE 30-0.9 UT/500ML-% IV SOLN
2.5000 [IU]/h | INTRAVENOUS | Status: DC
  Filled 2020-09-07: qty 500

## 2020-09-07 MED ORDER — OXYTOCIN-SODIUM CHLORIDE 30-0.9 UT/500ML-% IV SOLN
INTRAVENOUS | Status: DC | PRN
  Administered 2020-09-07: 30 [IU] via INTRAVENOUS

## 2020-09-07 MED ORDER — DEXTROSE 5 % IV SOLN
INTRAVENOUS | Status: AC
  Filled 2020-09-07: qty 3000

## 2020-09-07 MED ORDER — PHENYLEPHRINE 40 MCG/ML (10ML) SYRINGE FOR IV PUSH (FOR BLOOD PRESSURE SUPPORT)
PREFILLED_SYRINGE | INTRAVENOUS | Status: AC
  Filled 2020-09-07: qty 10

## 2020-09-07 MED ORDER — ACETAMINOPHEN 325 MG PO TABS: 650.0000 mg | ORAL_TABLET | ORAL | Status: DC | PRN

## 2020-09-07 MED ORDER — DEXTROSE 5 % IV SOLN
3.0000 g | Freq: Once | INTRAVENOUS | Status: DC
  Filled 2020-09-07: qty 3000

## 2020-09-07 MED ORDER — DEXTROSE 5 % IV SOLN
INTRAVENOUS | Status: DC | PRN
  Administered 2020-09-07: 3 g via INTRAVENOUS

## 2020-09-07 SURGICAL SUPPLY — 33 items
CHLORAPREP W/TINT 26ML (MISCELLANEOUS) ×2 IMPLANT
CLAMP CORD UMBIL (MISCELLANEOUS) IMPLANT
DRESSING PREVENA PLUS CUSTOM (GAUZE/BANDAGES/DRESSINGS) ×1 IMPLANT
DRSG OPSITE POSTOP 4X10 (GAUZE/BANDAGES/DRESSINGS) ×2 IMPLANT
DRSG PREVENA PLUS CUSTOM (GAUZE/BANDAGES/DRESSINGS) ×2
ELECT REM PT RETURN 9FT ADLT (ELECTROSURGICAL) ×2
ELECTRODE REM PT RTRN 9FT ADLT (ELECTROSURGICAL) ×1 IMPLANT
EXTENDER TRAXI PANNICULUS (MISCELLANEOUS) ×1 IMPLANT
EXTRACTOR VACUUM M CUP 4 TUBE (SUCTIONS) IMPLANT
GLOVE BIOGEL PI IND STRL 6.5 (GLOVE) ×1 IMPLANT
GLOVE BIOGEL PI IND STRL 7.0 (GLOVE) ×1 IMPLANT
GLOVE BIOGEL PI INDICATOR 6.5 (GLOVE) ×1
GLOVE BIOGEL PI INDICATOR 7.0 (GLOVE) ×1
GLOVE SURG SS PI 6.5 STRL IVOR (GLOVE) ×2 IMPLANT
GOWN STRL REUS W/TWL LRG LVL3 (GOWN DISPOSABLE) ×4 IMPLANT
KIT ABG SYR 3ML LUER SLIP (SYRINGE) IMPLANT
NEEDLE HYPO 25X5/8 SAFETYGLIDE (NEEDLE) IMPLANT
NS IRRIG 1000ML POUR BTL (IV SOLUTION) ×2 IMPLANT
PACK C SECTION WH (CUSTOM PROCEDURE TRAY) ×2 IMPLANT
PAD OB MATERNITY 4.3X12.25 (PERSONAL CARE ITEMS) ×2 IMPLANT
PENCIL SMOKE EVAC W/HOLSTER (ELECTROSURGICAL) ×2 IMPLANT
RETRACTOR TRAXI PANNICULUS (MISCELLANEOUS) ×1 IMPLANT
RTRCTR C-SECT PINK 25CM LRG (MISCELLANEOUS) IMPLANT
SUT PLAIN 0 NONE (SUTURE) IMPLANT
SUT PLAIN 2 0 (SUTURE) ×2
SUT PLAIN ABS 2-0 CT1 27XMFL (SUTURE) ×1 IMPLANT
SUT VIC AB 0 CT1 36 (SUTURE) ×8 IMPLANT
SUT VIC AB 4-0 KS 27 (SUTURE) ×2 IMPLANT
TOWEL OR 17X24 6PK STRL BLUE (TOWEL DISPOSABLE) ×2 IMPLANT
TRAXI PANNICULUS EXTENDER (MISCELLANEOUS) ×1
TRAXI PANNICULUS RETRACTOR (MISCELLANEOUS) ×1
TRAY FOLEY W/BAG SLVR 14FR LF (SET/KITS/TRAYS/PACK) ×2 IMPLANT
WATER STERILE IRR 1000ML POUR (IV SOLUTION) ×4 IMPLANT

## 2020-09-07 NOTE — Transfer of Care (Signed)
Immediate Anesthesia Transfer of Care Note  Patient: Danielle Garcia  Procedure(s) Performed: CESAREAN SECTION (N/A )  Patient Location: PACU  Anesthesia Type:Epidural  Level of Consciousness: awake, alert  and patient cooperative  Airway & Oxygen Therapy: Patient Spontanous Breathing  Post-op Assessment: Report given to RN and Post -op Vital signs reviewed and stable  Post vital signs: Reviewed and stable  Last Vitals:  Vitals Value Taken Time  BP 129/85 09/07/20 2354  Temp    Pulse 99 09/07/20 2357  Resp 18 09/07/20 2357  SpO2 99 % 09/07/20 2357  Vitals shown include unvalidated device data.  Last Pain:  Vitals:   09/07/20 2030  TempSrc: Axillary  PainSc:          Complications: No complications documented.

## 2020-09-07 NOTE — Progress Notes (Signed)
Labor Progress Note Danielle Garcia is a 30 y.o. G3P1011 at [redacted]w[redacted]d presented for IOL secondary to T2DM and fetal macrosomia.  S: Strip note. No concerns per nursing. FB now out.  O:  BP 116/62   Pulse 96   Temp 98.2 F (36.8 C) (Oral)   Resp 16   Ht 5\' 3"  (1.6 m)   Wt 125.2 kg   LMP 12/23/2019   SpO2 99%   BMI 48.89 kg/m  EFM: baseline 130/moderate variability/+accels/no decels  CVE: Dilation: 1 Effacement (%): 50 Station: -2 Presentation: Vertex Exam by:: Dr. Astrid Drafts   A&P: 30 y.o. [id number] [redacted]w[redacted]d IOL secondary to T2DM and fetal macrosomia. #TOLAC: Progressing well. FB now out. Will continue up-titration of pitocin as clinically indicated. #Pain: plan for epidural per pt report #FWB: Category 1 strip #GBS positive; continue PCN--started on admission #T2DM  Fetal Macrosomia: metformin in pregnancy. Will continue to monitor q2hr BG levels given above goal on admission. BG 133 > 139. Currently on SSI but will initiate Endotool if persistently above goal. 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 No recent outbreaks. No reported prodromal symptoms or visible lesions on admission. #H/o postpartum PE  Protein S deficiency: lovenox in pregnancy (1mg /kg/day)--last dose on 2/20. Plan for 6 weeks of prophylactic Lovenox in postpartum period. #cHTN: no medications in pregnancy. Asymptomatic. Most recent blood pressures wnl. F/u preeclampsia labs (collected on admission). #Asthma: avoid hemabate #Anxiety/Depression: no current medications or safety concerns. Will plan for 1 week mood check in clinic.  Randa Ngo, MD 5:11 AM

## 2020-09-07 NOTE — H&P (Signed)
OBSTETRIC ADMISSION HISTORY AND PHYSICAL  Danielle Garcia is a 30 y.o. female G3P1011 with IUP at [redacted]w[redacted]d by LMP presenting for IOL secondary to T2DM and fetal macrosomia. She reports +FMs, No LOF, no VB, no blurry vision, headaches or peripheral edema, and RUQ pain.  She plans on breast and bottle feeding. She is undecided for birth control. She received her prenatal care at [place]   Dating: By LMP --->  Estimated Date of Delivery: 09/28/20  Sono: @[redacted]w[redacted]d, CWD, normal anatomy, cephalic presentation, 3717g, 97% EFW  Prenatal History/Complications:  - T2DM (metformin in pregnancy) - HSV (good adherence to valtrex, no recent outbreaks) - h/o postpartum PE (lovenox in pregnancy) - Protein S deficiency - cHTN (no medications in pregnancy) - Asthma - Anxiety/Depression (no meds) - h/o Cesarean x1 (secondary to fetal macrosomia) - h/o multiple intramural myomas  Past Medical History: Past Medical History:  Diagnosis Date  . Anxiety   . Asthma    as a child  . Depression    doing ok now  . Diabetes mellitus without complication (HCC)   . Fibroid   . Floaters   . GERD (gastroesophageal reflux disease)   . HTN (hypertension)    2014/15 no meds since then  . Infection    UTI  . Migraine   . Pre-diabetes   . Pulmonary embolus (HCC)    after first delivery    Past Surgical History: Past Surgical History:  Procedure Laterality Date  . CESAREAN SECTION    . DILATION AND EVACUATION N/A 07/02/2018   Procedure: DILATATION AND EVACUATION;  Surgeon: Pickens, Charlie, MD;  Location: [facility] ORS;  Service: Gynecology;  Laterality: N/A;  . WISDOM TOOTH EXTRACTION      Obstetrical History: OB History    Gravida  3   Para  1   Term  1   Preterm      AB  1   Living  1     SAB  1   IAB      Ectopic      Multiple      Live Births  1        Obstetric Comments  9#, "wouldn't go into birth canal"        Social History Social History   Socioeconomic History  .  Marital status: Married    Spouse name: [name] Hayes  . Number of children: 1  . Years of education: Not on file  . Highest education level: Not on file  Occupational History  . Occupation: CMA    Employer: [employer]  Tobacco Use  . Smoking status: Never Smoker  . Smokeless tobacco: Never Used  Vaping Use  . Vaping Use: Never used  Substance and Sexual Activity  . Alcohol use: Not Currently    Comment: occasionally  . Drug use: No  . Sexual activity: Yes    Birth control/protection: None  Other Topics Concern  . Not on file  Social History Narrative  . Not on file   Social Determinants of Health   Financial Resource Strain: Not on file  Food Insecurity: No Food Insecurity  . Worried About Running Out of Food in the Last Year: Never true  . Ran Out of Food in the Last Year: Never true  Transportation Needs: Not on file  Physical Activity: Not on file  Stress: Not on file  Social Connections: Not on file    Family History: Family History  Problem Relation Age of Onset  . Hypertension   Mother   . Heart attack Father 45  . Obesity Father   . Hypertension Maternal Grandmother   . Diabetes Maternal Grandmother   . Lung cancer Maternal Grandfather   . Diabetes Paternal Grandmother   . Allergies Daughter     Allergies: No Known Allergies  Medications Prior to Admission  Medication Sig Dispense Refill Last Dose  . aspirin EC 81 MG tablet Take 1 tablet (81 mg total) by mouth daily. Swallow whole. 30 tablet 11 Past Week at Unknown time  . metFORMIN (GLUCOPHAGE) 500 MG tablet Take 1 tablet (500 mg total) by mouth 2 (two) times daily with a meal. 60 tablet 1 09/06/2020 at Unknown time  . mometasone (NASONEX) 50 MCG/ACT nasal spray Place 2 sprays into the nose daily. 1 each 11 Past Week at Unknown time  . pantoprazole (PROTONIX) 40 MG tablet Take 1 tablet (40 mg total) by mouth daily. 30 tablet 3 Past Week at Unknown time  . Prenatal Vit-Fe Fumarate-FA (PRENATAL VITAMIN)  27-0.8 MG TABS Take 1 tablet by mouth daily. 30 tablet  09/06/2020 at Unknown time  . valACYclovir (VALTREX) 500 MG tablet Take 1 tablet (500 mg total) by mouth 2 (two) times daily. Take 1 tab daily for 5 days during outbreaks. 60 tablet 1 09/06/2020 at Unknown time  . blood glucose meter kit and supplies Dispense based on patient and insurance preference. Use up to four times daily as directed. (FOR ICD-10 E10.9, E11.9). 1 each 0   . cetirizine (ZYRTEC) 10 MG tablet Take 10 mg by mouth daily.   More than a month at Unknown time  . Continuous Blood Gluc Receiver (DEXCOM G6 RECEIVER) DEVI 1 Units by Does not apply route See admin instructions. Check CBGs 4 times daily 1 each 0   . Continuous Blood Gluc Sensor (DEXCOM G6 SENSOR) MISC 1 Units by Does not apply route See admin instructions. Change every 10 days 9 each 3   . Continuous Blood Gluc Transmit (DEXCOM G6 TRANSMITTER) MISC 1 Units by Does not apply route as directed. (Patient not taking: Reported on 09/06/2020) 1 each 0   . cyclobenzaprine (FLEXERIL) 10 MG tablet Take 1 tablet (10 mg total) by mouth 2 (two) times daily as needed for muscle spasms. 20 tablet 0 More than a month at Unknown time  . Doxylamine-Pyridoxine (DICLEGIS) 10-10 MG TBEC Take 2 tablets by mouth at bedtime as needed. 60 tablet 5 Unknown at Unknown time  . Elastic Bandages & Supports (COMFORT FIT MATERNITY SUPP MED) MISC 1 Units by Does not apply route as needed. 1 each 0   . Elastic Bandages & Supports (WRIST SPLINT/COCK-UP/LEFT M) MISC 1 Units by Does not apply route at bedtime. 1 each 0   . enoxaparin (LOVENOX) 120 MG/0.8ML injection Inject 0.8 mLs (120 mg total) into the skin daily. 72 mL 3   . FREESTYLE LITE test strip USE TO CHECK BLOOD SUGAR UP TO 4 TIMES A DAY AS DIRECTED 100 strip 0   . Lancets (FREESTYLE) lancets USE TO CHECK BLOOD SUGAR UP TO 4 TIMES A DAY AS DIRECTED 100 each 0      Review of Systems   All systems reviewed and negative except as stated in  HPI  Blood pressure (!) 122/92, pulse (!) 106, temperature 98.2 F (36.8 C), temperature source Oral, resp. rate 18, height 5' 3" (1.6 m), weight 125.2 kg, last menstrual period 12/23/2019, SpO2 99 %, unknown if currently breastfeeding. General appearance: alert, cooperative and appears stated age Lungs:   normal WOB Heart: regular rate  Abdomen: soft, non-tender Extremities: no sign of DVT Presentation: cephalic Fetal monitoringBaseline: 130 bpm, Variability: Good {> 6 bpm), Accelerations: Reactive and Decelerations: Absent Uterine activityFrequency: Every 3-5 minutes Dilation: 1 Effacement (%): 50 Station: -2 Exam by:: Dr. Astrid Drafts   Prenatal labs: ABO, Rh: --/--/A POS (02/22 0013) Antibody: NEG (02/22 0013) Rubella: 1.12 (08/03 1156) RPR: Non Reactive (12/15 1350)  HBsAg: Negative (08/03 1156)  HIV: Non Reactive (12/15 1350)  GBS: Positive/-- (02/17 0845)  1 hr Glucola not performed (T2DM) Genetic screening wnl Anatomy US wnl  Prenatal Transfer Tool  Maternal Diabetes: Yes:  Diabetes Type:  Insulin/Medication controlled Genetic Screening: Normal Maternal Ultrasounds/Referrals: Normal Fetal Ultrasounds or other Referrals:  Referred to Materal Fetal Medicine , normal fetal Echo Maternal Substance Abuse:  No Significant Maternal Medications:  Meds include: Other:  metformin, valtrex, lovenox Significant Maternal Lab Results: Group B Strep positive, h/o HSV (good adherence on valtrex)  Results for orders placed or performed during the hospital encounter of 09/07/20 (from the past 24 hour(s))  CBC   Collection Time: 09/07/20 12:13 AM  Result Value Ref Range   WBC 9.1 4.0 - 10.5 K/uL   RBC 3.99 3.87 - 5.11 MIL/uL   Hemoglobin 12.1 12.0 - 15.0 g/dL   HCT 35.1 (L) 36.0 - 46.0 %   MCV 88.0 80.0 - 100.0 fL   MCH 30.3 26.0 - 34.0 pg   MCHC 34.5 30.0 - 36.0 g/dL   RDW 13.2 11.5 - 15.5 %   Platelets 295 150 - 400 K/uL   nRBC 0.0 0.0 - 0.2 %  Type and screen   Collection Time:  09/07/20 12:13 AM  Result Value Ref Range   ABO/RH(D) A POS    Antibody Screen NEG    Sample Expiration      09/10/2020,2359 Performed at [facility], [place] [address] [place], [place] [address]     Patient Active Problem List   Diagnosis Date Noted  . Encounter for induction of labor 09/07/2020  . GBS bacteriuria 09/06/2020  . Excessive fetal growth affecting management of mother in third trimester, antepartum 08/17/2020  . Preterm labor in third trimester without delivery 07/30/2020  . Diabetes mellitus during pregnancy, antepartum 03/12/2020  . Supervision of high risk pregnancy, antepartum 02/17/2020  . History of pulmonary embolism 02/17/2020  . Asthma 10/07/2019  . Vitamin D deficiency 08/04/2019  . Morbid obesity ([place]) 02/07/2019  . Gastroesophageal reflux disease 11/27/2018  . Back pain 09/25/2018  . Urinary frequency 09/25/2018  . Other fatigue 02/05/2018  . Shortness of breath on exertion 02/05/2018  . Migraine 02/05/2018  . Essential hypertension 02/05/2018  . HSV-2 (herpes simplex virus 2) infection 05/01/2017    Assessment/Plan:  Frederica Chrestman is a 30 y.o. G3P1011 at 86w0dhere for TOLAC secondary to T2DM and fetal macrosomia.  #TOLAC  H/o Cesarean x1: No prior labor given scheduled Cesarean secondary to fetal macrosomia with first delivery. Counseled pt regarding potential shoulder dystocia in addition to risks of TOLAC as noted below. Given initial cervical exam, FB placed on admission. Will plan to initiate low dose pitocin 4 hours s/p FB placement.  Reviewed risks/benefits of TOLAC versus RCS in detail. Patient counseled regarding potential vaginal delivery, chance of success, future implications, possible uterine rupture and need for urgent/emergent repeat cesarean. Counseled regarding potential need for repeat c-section for reasons unrelated to first c-section. Counseled regarding scheduled repeat cesarean including risks of bleeding, infection,  damage to surrounding tissue, abnormal  placentation, implications for future pregnancies. All questions answered.  Patient desires TOLAC, consent signed 08/17/20.  #Pain: Pt plans on epidural. #FWB:  Category 1 strip #ID: GBS+ >PCN on admission #MOF: breast #MOC: undecided; counseled on admission #Circ: n/a #T2DM  Fetal Macrosomia: metformin in pregnancy. Will monitor BG q4hr in latent labor, then q2hr in active labor. Will initiate insulin as clinically indicated. 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 No recent outbreaks. No reported prodromal symptoms or visible lesions on admission. #H/o postpartum PE  Protein S deficiency: lovenox in pregnancy (87m/kg/day). Plan for 6 weeks of prophylactic Lovenox in postpartum period. #cHTN: no medications in pregnancy. Asymptomatic. F/u preeclampsia labs on admission. #Asthma: avoid hemabate #Anxiety/Depression: no current medications or safety concerns. Will plan for 1 week mood check in clinic.  GRanda Ngo MD OB Fellow, Faculty Practice 09/07/2020 2:09 AM

## 2020-09-07 NOTE — Discharge Instructions (Signed)

## 2020-09-07 NOTE — Progress Notes (Signed)
Subjective: Pt is doing well. Minimal discomfort with epidural in place.  Objective: BP (!) 144/91   Pulse 100   Temp 99.2 F (37.3 C) (Axillary)   Resp 16   Ht 5\' 3"  (1.6 m)   Wt 125.2 kg   LMP 12/23/2019   SpO2 99%   BMI 48.89 kg/m  I/O last 3 completed shifts: In: -  Out: 400 [Urine:400] No intake/output data recorded.  FHT:  FHR: 150 bpm, variability: moderate,  accelerations:  Abscent,  decelerations:  Recurrent late decelerations, now improved s/p discontinuation of pitocin UC:   regular, every 2-3 minutes SVE:   Dilation: 5 Effacement (%): 80 Station: -2,-1 Exam by:: Orell Hurtado  Labs: Lab Results  Component Value Date   WBC 9.1 09/07/2020   HGB 12.1 09/07/2020   HCT 35.1 (L) 09/07/2020   MCV 88.0 09/07/2020   PLT 295 09/07/2020    Assessment / Plan: 30 y.o. G3P1011 [redacted]w[redacted]d IOL secondary to T2DM and fetal macrosomia, now with arrest of dilation. #TOLAC: Pt now unchanged since 1030 this morning despite up-titration of pitocin. Pt agrees to Cesarean at this time given arrest of dilation in the setting of fetal intolerance to pitocin. Consented as noted below.  The risks of cesarean section were discussed with the patient including but were not limited to: bleeding which may require transfusion or reoperation; infection which may require antibiotics; injury to bowel, bladder, ureters or other surrounding organs; injury to the fetus; need for additional procedures including hysterectomy in the event of a life-threatening hemorrhage; placental abnormalities wth subsequent pregnancies, incisional problems, thromboembolic phenomenon and other postoperative/anesthesia complications. The patient concurred with the proposed plan, giving informed written consent for the procedures.  Patient has been NPO since last night; she will remain NPO for procedure. Anesthesia and OR aware.  Preoperative prophylactic antibiotics (ancef 3g, azithromycin 500mg ) and SCDs ordered on call to the OR.   To OR when ready.  #Pain: epidural in place #FWB: Category 2 strip given recurrent late decelerations, now improved s/p discontinuation of pitocin. #GBS positive; PCN since admission. #T2DM  Fetal Macrosomia: metformin in pregnancy. Pt overall at goal intrapartum. Given last BG check will cover with 3 units of sliding scale insulin. 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 No recent outbreaks. No reported prodromal symptoms or visible lesions on admission. #H/o postpartum PE  Protein S deficiency: lovenox in pregnancy (1mg /kg/day)--last dose on 2/20. Plan for 6 weeks of prophylactic Lovenox in postpartum period, to start 18-24 hours s/p delivery. #cHTN: no medications in pregnancy. Asymptomatic. Blood pressures normal to mild range. Preeclampsia labs wnl on admission. #Asthma: avoid hemabate #Anxiety/Depression: no current medications or safety concerns. Will plan for 1 week mood check in clinic.  Randa Ngo 09/07/2020, 9:44 PM

## 2020-09-07 NOTE — Anesthesia Preprocedure Evaluation (Signed)
Anesthesia Evaluation  Patient identified by MRN, date of birth, ID band Patient awake    Reviewed: Allergy & Precautions, NPO status , Patient's Chart, lab work & pertinent test results  Airway Mallampati: II  TM Distance: >3 FB Neck ROM: Full    Dental no notable dental hx.  Unable to remove piercing to right ear:   Pulmonary asthma (Childhood) ,    Pulmonary exam normal breath sounds clear to auscultation       Cardiovascular hypertension, Normal cardiovascular exam Rhythm:Regular Rate:Normal  ECG: SR, rate 70   Neuro/Psych  Headaches, PSYCHIATRIC DISORDERS Anxiety Depression    GI/Hepatic Neg liver ROS, GERD  Controlled,  Endo/Other  diabetesMorbid obesity  Renal/GU negative Renal ROS     Musculoskeletal Back pain   Abdominal (+) + obese,   Peds  Hematology negative hematology ROS (+)   Anesthesia Other Findings MISSED AB  Reproductive/Obstetrics 8 wga                             Anesthesia Physical  Anesthesia Plan  ASA: III  Anesthesia Plan: Epidural   Post-op Pain Management:    Induction:   PONV Risk Score and Plan: Treatment may vary due to age or medical condition  Airway Management Planned:   Additional Equipment:   Intra-op Plan:   Post-operative Plan:   Informed Consent: I have reviewed the patients History and Physical, chart, labs and discussed the procedure including the risks, benefits and alternatives for the proposed anesthesia with the patient or authorized representative who has indicated his/her understanding and acceptance.       Plan Discussed with:   Anesthesia Plan Comments:         Anesthesia Quick Evaluation

## 2020-09-07 NOTE — Discharge Summary (Addendum)
Postpartum Discharge Summary    Patient Name: Danielle Garcia DOB: 05-04-1991 MRN: [id number]  Date of admission: 09/07/2020 Delivery date:09/07/2020  Delivering provider: CONSTANT, PEGGY  Date of discharge: 09/09/2020  Admitting diagnosis: Encounter for induction of labor [Z34.90] Intrauterine pregnancy: [redacted]w[redacted]d    Secondary diagnosis:  Principal Problem:   Cesarean delivery delivered Active Problems:   HSV-2 (herpes simplex virus 2) infection   Essential hypertension   Asthma   History of pulmonary embolism   Diabetes mellitus during pregnancy, antepartum   Encounter for induction of labor  Additional problems: as noted above Discharge diagnosis: Cesarean Delivery Delivered                                      Post partum procedures:none Augmentation: AROM, Pitocin and IP Foley Complications: None  Hospital course: Induction of Labor With Cesarean Section   30y.o. [id number] admitted to the hospital 09/07/2020 for induction of labor. Patient had a labor course significant for arrest of dilation at 5cm. The patient went for cesarean section due to Arrest of Dilation, NRFHTs. Delivery details are as follows: Membrane Rupture Time/Date: 10:40 AM ,09/07/2020   Delivery Method:C-Section, Low Transverse  Details of operation can be found in separate operative Note.  Patient had an uncomplicated postpartum course. She is ambulating, tolerating a regular diet, passing flatus, and urinating well.  Patient is discharged home in stable condition on 09/09/20. She was discharged on metformin given her T2DM and 6 weeks of prophylactic lovenox daily given history of postpartum pulmonary embolism.  Newborn Data: Birth date:09/07/2020  Birth time:10:44 PM  Gender:Female  Living status:Living  Apgars:8 ,9  Weight:3755 g                                 Magnesium Sulfate received: No BMZ received: No Rhophylac:N/A MMR:N/A T-DaP:offered prior to discharge Flu: offered prior to  discharge Transfusion:No  Physical exam  Vitals:   09/08/20 0958 09/08/20 1340 09/08/20 2111 09/09/20 0530  BP: 109/60 129/65 139/62 127/67  Pulse: 81 84 91 80  Resp: _0 Temp: 97.7 F (36.5 C) 98.3 F (36.8 C) 98.2 F (36.8 C) 98 F (36.7 C)  TempSrc: Oral Oral Axillary Oral  SpO2: 99% 98% 100% 100%  Weight:      Height:       General: alert, cooperative and no distress Lochia: appropriate Uterine Fundus: firm Incision: Healing well with no significant drainage, No significant erythema, Dressing is clean, dry, and intact DVT Evaluation: No evidence of DVT seen on physical exam. No cords or calf tenderness. No significant calf/ankle edema. Labs: Lab Results  Component Value Date   WBC 20.6 (H) 09/08/2020   HGB 10.1 (L) 09/08/2020   HCT 30.9 (L) 09/08/2020   MCV 90.1 09/08/2020   PLT 249 09/08/2020   CMP Latest Ref Rng & Units 09/08/2020  Glucose 70 - 99 mg/dL 72  BUN 6 - 20 mg/dL 7  Creatinine 0.44 - 1.00 mg/dL 0.64  Sodium 135 - 145 mmol/L 137  Potassium 3.5 - 5.1 mmol/L 4.0  Chloride 98 - 111 mmol/L 105  CO2 22 - 32 mmol/L 24  Calcium 8.9 - 10.3 mg/dL 8.6(L)  Total Protein 6.5 - 8.1 g/dL -  Total Bilirubin 0.3 - 1.2 mg/dL -  Alkaline Phos 38 - 126  U/L -  AST 15 - 41 U/L -  ALT 0 - 44 U/L -   Edinburgh Score: Edinburgh Postnatal Depression Scale Screening Tool 09/08/2020  I have been able to laugh and see the funny side of things. (No Data)     After visit meds:  Allergies as of 09/09/2020   No Known Allergies     Medication List    STOP taking these medications   aspirin EC 81 MG tablet   blood glucose meter kit and supplies   calcium carbonate 500 MG chewable tablet Commonly known as: TUMS - dosed in mg elemental calcium   Comfort Fit Maternity Supp Med Misc   cyclobenzaprine 10 MG tablet Commonly known as: FLEXERIL   Dexcom G6 Receiver Devi   Dexcom G6 Sensor Misc   Dexcom G6 Transmitter Misc   mometasone 50 MCG/ACT nasal  spray Commonly known as: Nasonex   pantoprazole 40 MG tablet Commonly known as: PROTONIX   valACYclovir 500 MG tablet Commonly known as: VALTREX   Wrist Splint/Cock-Up/Left M Misc     TAKE these medications   acetaminophen 500 MG tablet Commonly known as: TYLENOL Take 2 tablets (1,000 mg total) by mouth every 8 (eight) hours. What changed:   when to take this  reasons to take this   coconut oil Oil Apply 1 application topically as needed (nipple pain).   enoxaparin 120 MG/0.8ML injection Commonly known as: LOVENOX Inject 142m into the skin daily What changed:   how much to take  how to take this  when to take this  additional instructions   ferrous sulfate 325 (65 FE) MG tablet Take 1 tablet (325 mg total) by mouth every other day. Start taking on: September 10, 2020   freestyle lancets USE TO CHECK BLOOD SUGAR UP TO 4 TIMES A DAY AS DIRECTED   FREESTYLE LITE test strip Generic drug: glucose blood USE TO CHECK BLOOD SUGAR UP TO 4 TIMES A DAY AS DIRECTED   ibuprofen 800 MG tablet Commonly known as: ADVIL Take 1 tablet (800 mg total) by mouth every 8 (eight) hours.   metFORMIN 500 MG tablet Commonly known as: Glucophage Take 1 tablet (500 mg total) by mouth 2 (two) times daily with a meal.   oxyCODONE 5 MG immediate release tablet Commonly known as: Oxy IR/ROXICODONE Take 1 tablet (5 mg total) by mouth every 6 (six) hours as needed for severe pain or breakthrough pain.   Prenatal Vitamin 27-0.8 MG Tabs Take 1 tablet by mouth daily.        Discharge home in stable condition Infant Feeding: Breast Infant Disposition:home with mother Discharge instruction: per After Visit Summary and Postpartum booklet. Activity: Advance as tolerated. Pelvic rest for 6 weeks.  Diet: routine diet Future Appointments: Future Appointments  Date Time Provider [place] 09/16/2020 10:45 AM STruett Mainland DO [facility] None  10/14/2020 10:30 AM STruett Mainland DO [facility] None   Follow up Visit: [facility].  Please schedule this patient for a In person postpartum visit in 6 weeks with the following provider: MD. Additional Postpartum F/U:Postpartum Depression checkup, Incision check 1 week and BP check 1 week  High risk pregnancy complicated by: now h/o Cesarean x2, cHTN (no meds), T2DM, h/o postpartum PE (on lovenox in pregnancy), Protein S deficiency, HSV (on valtrex), Anxiety/Depression (no meds) Delivery mode:  C-Section, Low Transverse  Anticipated Birth Control:  UGena Fray MD  PGY-1 Family Medicine 09/09/2020 7:12 AM  Attestation of Supervision of Student:  I confirm that I have verified the information documented in the resident's note and that I have also personally reperformed the history, physical exam and all medical decision making activities.  I have verified that all services and findings are accurately documented in this student's note; and I agree with management and plan as outlined in the documentation. I have also made any necessary editorial changes.  Randa Ngo, [place] for [facility] 09/09/2020 8:58 AM

## 2020-09-07 NOTE — Progress Notes (Signed)
Subjective: Patient is doing well, comfortable with epidural.   Objective: BP 122/65   Pulse (!) 101   Temp 99.6 F (37.6 C) (Oral)   Resp 20   Ht 5\' 3"  (1.6 m)   Wt 125.2 kg   LMP 12/23/2019   SpO2 99%   BMI 48.89 kg/m  I/O last 3 completed shifts: In: -  Out: 400 [Urine:400] No intake/output data recorded.  FHT:  FHR: 135 bpm, variability: moderate,  accelerations:  Abscent,  decelerations:  Absent UC:   regular, every 2-3 minutes SVE:   Dilation: 5 Effacement (%): 80 Station: -2,-1 Exam by:: Dr. Elly Modena  Labs: Lab Results  Component Value Date   WBC 9.1 09/07/2020   HGB 12.1 09/07/2020   HCT 35.1 (L) 09/07/2020   MCV 88.0 09/07/2020   PLT 295 09/07/2020    Assessment / Plan: Arrest in active phase of labor No cervical change made since 11 am despite adequate contractions Discussed delivery via repeat cesarean section Patient desires to wait an additional 2 hours  Danielle Garcia 09/07/2020, 8:09 PM

## 2020-09-07 NOTE — Op Note (Signed)
Danielle Garcia PROCEDURE DATE: 09/07/2020  PREOPERATIVE DIAGNOSIS: Intrauterine pregnancy at  [redacted]w[redacted]d weeks gestation; failure to progress: arrest of dilation  POSTOPERATIVE DIAGNOSIS: The same  PROCEDURE:     Cesarean Section  SURGEON:  Dr. Mora Bellman  ASSISTANT:   INDICATIONS: Danielle Garcia is a 30 y.o. G3P1011 at [redacted]w[redacted]d scheduled for cesarean section secondary to failure to progress: arrest of dilation and non-reassuring fetal status.  The risks of cesarean section discussed with the patient included but were not limited to: bleeding which may require transfusion or reoperation; infection which may require antibiotics; injury to bowel, bladder, ureters or other surrounding organs; injury to the fetus; need for additional procedures including hysterectomy in the event of a life-threatening hemorrhage; placental abnormalities wth subsequent pregnancies, incisional problems, thromboembolic phenomenon and other postoperative/anesthesia complications. The patient concurred with the proposed plan, giving informed written consent for the procedure.    FINDINGS:  Viable female infant in cephalic presentation.  Apgars 8 and 9. Clear amniotic fluid.  Intact placenta, three vessel cord.  Normal uterus, fallopian tubes and ovaries bilaterally. Omental adhesions noted to anterior abdominal wall at the level of the lower uterine segment  ANESTHESIA:    Spinal INTRAVENOUS FLUIDS:1850 ml ESTIMATED BLOOD LOSS: 475 ml URINE OUTPUT:  100 ml SPECIMENS: Placenta sent to L&D COMPLICATIONS: None immediate  PROCEDURE IN DETAIL:  The patient received intravenous antibiotics and had sequential compression devices applied to her lower extremities while in the preoperative area.  She was then taken to the operating room where anesthesia was induced and was found to be adequate. A foley catheter was placed into her bladder and attached to constant gravity. She was then placed in a dorsal supine position with a  leftward tilt, and prepped and draped in a sterile manner. After an adequate timeout was performed, a Pfannenstiel skin incision was made with scalpel and carried through to the underlying layer of fascia. The fascia was incised in the midline and this incision was extended bilaterally using the Mayo scissors. Kocher clamps were applied to the superior aspect of the fascial incision and the underlying rectus muscles were dissected off bluntly. A similar process was carried out on the inferior aspect of the facial incision. The rectus muscles were separated in the midline bluntly and the peritoneum was entered bluntly. The Alexis self-retaining retractor was introduced into the abdominal cavity. Attention was turned to the lower uterine segment where a transverse hysterotomy was made with a scalpel and extended bilaterally bluntly. The infant was successfully delivered, and delayed cord clamping was performed for 1 minute. The cord was clamped and cut and infant was handed over to awaiting neonatology team. Uterine massage was then administered and the placenta delivered intact with three-vessel cord. The uterus was cleared of clot and debris.  The hysterotomy was closed with 0 Vicryl in a running locked fashion, and an imbricating layer was also placed with a 0 Vicryl. Overall, excellent hemostasis was noted. The pelvis copiously irrigated and cleared of all clot and debris. Hemostasis was confirmed on all surfaces.  The peritoneum and the muscles were reapproximated using 0 vicryl interrupted stitches. The fascia was then closed using 0 Vicryl in a running fashion.  The subcutaneous layer was reapproximated with plain gut and the skin was closed in a subcuticular fashion using 3.0 Vicryl. The patient tolerated the procedure well. Sponge, lap, instrument and needle counts were correct x 2. Prevena wound vac was applied over the incision. She was taken to the recovery room in  stable condition.    Peggy ConstantMD   09/07/2020 11:22 PM

## 2020-09-07 NOTE — Anesthesia Procedure Notes (Signed)
Epidural Patient location during procedure: OB Start time: 09/07/2020 9:39 AM End time: 09/07/2020 9:54 AM  Staffing Anesthesiologist: Lynda Rainwater, MD Performed: anesthesiologist   Preanesthetic Checklist Completed: patient identified, IV checked, site marked, risks and benefits discussed, surgical consent, monitors and equipment checked, pre-op evaluation and timeout performed  Epidural Patient position: sitting Prep: ChloraPrep Patient monitoring: heart rate, cardiac monitor, continuous pulse ox and blood pressure Approach: midline Location: L2-L3 Injection technique: LOR saline  Needle:  Needle type: Tuohy  Needle gauge: 17 G Needle length: 9 cm Needle insertion depth: 9 cm Catheter type: closed end flexible Catheter size: 20 Guage Catheter at skin depth: 14 cm Test dose: negative  Assessment Events: blood not aspirated, injection not painful, no injection resistance, no paresthesia and negative IV test  Additional Notes Epidural placed by SRNA under direct supervisionReason for block:procedure for pain

## 2020-09-07 NOTE — Progress Notes (Signed)
Patient ID: Danielle Garcia, female   DOB: 1990-10-28, 31 y.o.   MRN: [id number]  Patient comfortable with epidural.   BP 117/68   Pulse 81   Temp 98 F (36.7 C) (Oral)   Resp 20   Ht 5\' 3"  (1.6 m)   Wt 125.2 kg   LMP 12/23/2019   SpO2 99%   BMI 48.89 kg/m   Dilation: 5 Effacement (%): 70 Station: -2 Presentation: Vertex Exam by:: Foley,rnc  AROM with clear fluid, some bleeding.  FHT: 130, mod variablity. +accel. Had decel after IUPC insertion, resolved after position change.  Continue pitocin.  Truett Mainland, DO

## 2020-09-08 ENCOUNTER — Encounter (HOSPITAL_COMMUNITY): Payer: Self-pay

## 2020-09-08 DIAGNOSIS — Z3482 Encounter for supervision of other normal pregnancy, second trimester: Secondary | ICD-10-CM | POA: Diagnosis not present

## 2020-09-08 DIAGNOSIS — Z3483 Encounter for supervision of other normal pregnancy, third trimester: Secondary | ICD-10-CM | POA: Diagnosis not present

## 2020-09-08 LAB — BASIC METABOLIC PANEL
Anion gap: 8 (ref 5–15)
BUN: 7 mg/dL (ref 6–20)
CO2: 24 mmol/L (ref 22–32)
Calcium: 8.6 mg/dL — ABNORMAL LOW (ref 8.9–10.3)
Chloride: 105 mmol/L (ref 98–111)
Creatinine, Ser: 0.64 mg/dL (ref 0.44–1.00)
GFR, Estimated: >60 mL/min (ref >60)
Glucose, Bld: 72 mg/dL (ref 70–99)
Potassium: 4 mmol/L (ref 3.5–5.1)
Sodium: 137 mmol/L (ref 135–145)

## 2020-09-08 LAB — HEMOGLOBIN A1C
Hgb A1c MFr Bld: 5.8 % — ABNORMAL HIGH (ref 4.8–5.6)
Mean Plasma Glucose: 119.76 mg/dL

## 2020-09-08 LAB — GLUCOSE, CAPILLARY
Glucose-Capillary: 126 mg/dL — ABNORMAL HIGH (ref 70–99)
Glucose-Capillary: 66 mg/dL — ABNORMAL LOW (ref 70–99)
Glucose-Capillary: 76 mg/dL (ref 70–99)

## 2020-09-08 LAB — CBC
HCT: 30.9 % — ABNORMAL LOW (ref 36.0–46.0)
Hemoglobin: 10.1 g/dL — ABNORMAL LOW (ref 12.0–15.0)
MCH: 29.4 pg (ref 26.0–34.0)
MCHC: 32.7 g/dL (ref 30.0–36.0)
MCV: 90.1 fL (ref 80.0–100.0)
Platelets: 249 10*3/uL (ref 150–400)
RBC: 3.43 MIL/uL — ABNORMAL LOW (ref 3.87–5.11)
RDW: 13 % (ref 11.5–15.5)
WBC: 20.6 10*3/uL — ABNORMAL HIGH (ref 4.0–10.5)
nRBC: 0 % (ref 0.0–0.2)

## 2020-09-08 MED ORDER — WITCH HAZEL-GLYCERIN EX PADS: 1.0000 "application " | MEDICATED_PAD | CUTANEOUS | Status: DC | PRN

## 2020-09-08 MED ORDER — NALBUPHINE HCL 10 MG/ML IJ SOLN
5.0000 mg | INTRAMUSCULAR | Status: DC | PRN
  Administered 2020-09-08: 5 mg via INTRAVENOUS
  Filled 2020-09-08 (×2): qty 1

## 2020-09-08 MED ORDER — FENTANYL CITRATE (PF) 100 MCG/2ML IJ SOLN: 25.0000 ug | INTRAMUSCULAR | Status: DC | PRN

## 2020-09-08 MED ORDER — NALBUPHINE HCL 10 MG/ML IJ SOLN
5.0000 mg | Freq: Once | INTRAMUSCULAR | Status: DC | PRN
Start: 2020-09-08 — End: 2020-09-10

## 2020-09-08 MED ORDER — ENOXAPARIN SODIUM 120 MG/0.8ML ~~LOC~~ SOLN
120.0000 mg | SUBCUTANEOUS | Status: DC
  Administered 2020-09-09: 120 mg via SUBCUTANEOUS
  Filled 2020-09-08 (×2): qty 0.8

## 2020-09-08 MED ORDER — ACETAMINOPHEN 10 MG/ML IV SOLN: 1000.0000 mg | Freq: Once | INTRAVENOUS | Status: DC | PRN

## 2020-09-08 MED ORDER — PRENATAL MULTIVITAMIN CH
1.0000 | ORAL_TABLET | Freq: Every day | ORAL | Status: DC
  Administered 2020-09-08 – 2020-09-10 (×3): 1 via ORAL
  Filled 2020-09-08 (×3): qty 1

## 2020-09-08 MED ORDER — KETOROLAC TROMETHAMINE 30 MG/ML IJ SOLN
30.0000 mg | Freq: Four times a day (QID) | INTRAMUSCULAR | Status: AC | PRN
  Administered 2020-09-08: 30 mg via INTRAVENOUS

## 2020-09-08 MED ORDER — NALBUPHINE HCL 10 MG/ML IJ SOLN
5.0000 mg | INTRAMUSCULAR | Status: DC | PRN
Start: 2020-09-08 — End: 2020-09-10
  Administered 2020-09-08 (×3): 5 mg via SUBCUTANEOUS
  Filled 2020-09-08 (×3): qty 1

## 2020-09-08 MED ORDER — OXYTOCIN-SODIUM CHLORIDE 30-0.9 UT/500ML-% IV SOLN: 2.5000 [IU]/h | INTRAVENOUS | Status: AC

## 2020-09-08 MED ORDER — METFORMIN HCL 500 MG PO TABS
500.0000 mg | ORAL_TABLET | Freq: Two times a day (BID) | ORAL | Status: DC
  Administered 2020-09-08 – 2020-09-10 (×4): 500 mg via ORAL
  Filled 2020-09-08 (×4): qty 1

## 2020-09-08 MED ORDER — SENNOSIDES-DOCUSATE SODIUM 8.6-50 MG PO TABS
2.0000 | ORAL_TABLET | Freq: Every day | ORAL | Status: DC
  Administered 2020-09-08 – 2020-09-10 (×3): 2 via ORAL
  Filled 2020-09-08 (×3): qty 2

## 2020-09-08 MED ORDER — NALOXONE HCL 4 MG/10ML IJ SOLN
1.0000 ug/kg/h | INTRAVENOUS | Status: DC | PRN
  Filled 2020-09-08: qty 5

## 2020-09-08 MED ORDER — SCOPOLAMINE 1 MG/3DAYS TD PT72: 1.0000 | MEDICATED_PATCH | Freq: Once | TRANSDERMAL | Status: DC

## 2020-09-08 MED ORDER — METFORMIN HCL 500 MG PO TABS: 500.0000 mg | ORAL_TABLET | Freq: Two times a day (BID) | ORAL | Status: DC

## 2020-09-08 MED ORDER — KETOROLAC TROMETHAMINE 30 MG/ML IJ SOLN: 30.0000 mg | Freq: Four times a day (QID) | INTRAMUSCULAR | Status: AC | PRN

## 2020-09-08 MED ORDER — NALOXONE HCL 0.4 MG/ML IJ SOLN: 0.4000 mg | INTRAMUSCULAR | Status: DC | PRN

## 2020-09-08 MED ORDER — FERROUS SULFATE 325 (65 FE) MG PO TABS
325.0000 mg | ORAL_TABLET | ORAL | Status: DC
  Administered 2020-09-08 – 2020-09-10 (×2): 325 mg via ORAL
  Filled 2020-09-08 (×2): qty 1

## 2020-09-08 MED ORDER — ACETAMINOPHEN 500 MG PO TABS: 1000.0000 mg | ORAL_TABLET | Freq: Four times a day (QID) | ORAL | Status: DC

## 2020-09-08 MED ORDER — DIPHENHYDRAMINE HCL 50 MG/ML IJ SOLN: 12.5000 mg | INTRAMUSCULAR | Status: DC | PRN

## 2020-09-08 MED ORDER — OXYCODONE HCL 5 MG PO TABS
5.0000 mg | ORAL_TABLET | ORAL | Status: DC | PRN
  Filled 2020-09-08: qty 1

## 2020-09-08 MED ORDER — TETANUS-DIPHTH-ACELL PERTUSSIS 5-2.5-18.5 LF-MCG/0.5 IM SUSY
0.5000 mL | PREFILLED_SYRINGE | Freq: Once | INTRAMUSCULAR | Status: DC
  Filled 2020-09-08: qty 0.5

## 2020-09-08 MED ORDER — KETOROLAC TROMETHAMINE 30 MG/ML IJ SOLN
30.0000 mg | Freq: Four times a day (QID) | INTRAMUSCULAR | Status: AC
  Administered 2020-09-08 (×3): 30 mg via INTRAVENOUS
  Filled 2020-09-08 (×3): qty 1

## 2020-09-08 MED ORDER — LACTATED RINGERS IV BOLUS
500.0000 mL | Freq: Once | INTRAVENOUS | Status: AC
  Administered 2020-09-08: 500 mL via INTRAVENOUS

## 2020-09-08 MED ORDER — KETOROLAC TROMETHAMINE 30 MG/ML IJ SOLN
INTRAMUSCULAR | Status: AC
  Filled 2020-09-08: qty 1

## 2020-09-08 MED ORDER — ENOXAPARIN SODIUM 150 MG/ML ~~LOC~~ SOLN
1.0000 mg/kg | SUBCUTANEOUS | Status: DC
  Administered 2020-09-08: 126 mg via SUBCUTANEOUS
  Filled 2020-09-08: qty 0.84

## 2020-09-08 MED ORDER — SIMETHICONE 80 MG PO CHEW: 80.0000 mg | CHEWABLE_TABLET | ORAL | Status: DC | PRN

## 2020-09-08 MED ORDER — INSULIN ASPART 100 UNIT/ML ~~LOC~~ SOLN
0.0000 [IU] | Freq: Three times a day (TID) | SUBCUTANEOUS | Status: DC
  Administered 2020-09-08: 2 [IU] via SUBCUTANEOUS

## 2020-09-08 MED ORDER — LACTATED RINGERS IV SOLN: INTRAVENOUS | Status: AC

## 2020-09-08 MED ORDER — INSULIN ASPART 100 UNIT/ML ~~LOC~~ SOLN: 0.0000 [IU] | Freq: Every day | SUBCUTANEOUS | Status: DC

## 2020-09-08 MED ORDER — DIPHENHYDRAMINE HCL 25 MG PO CAPS: 25.0000 mg | ORAL_CAPSULE | ORAL | Status: DC | PRN

## 2020-09-08 MED ORDER — ONDANSETRON HCL 4 MG/2ML IJ SOLN: 4.0000 mg | Freq: Three times a day (TID) | INTRAMUSCULAR | Status: DC | PRN

## 2020-09-08 MED ORDER — SODIUM CHLORIDE 0.9% FLUSH: 3.0000 mL | INTRAVENOUS | Status: DC | PRN

## 2020-09-08 MED ORDER — DIPHENHYDRAMINE HCL 25 MG PO CAPS
25.0000 mg | ORAL_CAPSULE | Freq: Four times a day (QID) | ORAL | Status: DC | PRN
  Administered 2020-09-08: 25 mg via ORAL
  Filled 2020-09-08: qty 1

## 2020-09-08 MED ORDER — COCONUT OIL OIL: 1.0000 "application " | TOPICAL_OIL | Status: DC | PRN

## 2020-09-08 MED ORDER — ENOXAPARIN SODIUM 80 MG/0.8ML ~~LOC~~ SOLN: 0.5000 mg/kg | SUBCUTANEOUS | Status: DC

## 2020-09-08 MED ORDER — ACETAMINOPHEN 500 MG PO TABS
1000.0000 mg | ORAL_TABLET | Freq: Four times a day (QID) | ORAL | Status: DC
  Administered 2020-09-08 – 2020-09-10 (×10): 1000 mg via ORAL
  Filled 2020-09-08 (×10): qty 2

## 2020-09-08 MED ORDER — IBUPROFEN 800 MG PO TABS
800.0000 mg | ORAL_TABLET | Freq: Four times a day (QID) | ORAL | Status: DC
  Administered 2020-09-08 – 2020-09-10 (×7): 800 mg via ORAL
  Filled 2020-09-08 (×7): qty 1

## 2020-09-08 MED ORDER — NALBUPHINE HCL 10 MG/ML IJ SOLN: 5.0000 mg | Freq: Once | INTRAMUSCULAR | Status: DC | PRN

## 2020-09-08 MED ORDER — DIBUCAINE (PERIANAL) 1 % EX OINT: 1.0000 "application " | TOPICAL_OINTMENT | CUTANEOUS | Status: DC | PRN

## 2020-09-08 MED ORDER — SIMETHICONE 80 MG PO CHEW
80.0000 mg | CHEWABLE_TABLET | Freq: Three times a day (TID) | ORAL | Status: DC
  Administered 2020-09-08 – 2020-09-10 (×8): 80 mg via ORAL
  Filled 2020-09-08 (×8): qty 1

## 2020-09-08 MED ORDER — MENTHOL 3 MG MT LOZG: 1.0000 | LOZENGE | OROMUCOSAL | Status: DC | PRN

## 2020-09-08 NOTE — Social Work (Signed)
MOB was referred for history of depression and anxiety.   * Referral screened out by Clinical Social Worker because none of the following criteria appear to apply:  ~ History of anxiety/depression during this pregnancy, or of post-partum depression following prior delivery. ~ Diagnosis of anxiety and/or depression within last 3 years. CSW notes a diagnosis date at or around 2018. OR * MOB's symptoms currently being treated with medication and/or therapy.  Please contact the Clinical Social Worker if needs arise, by [facility] request, or if MOB scores greater than 9/yes to question 10 on Edinburgh Postpartum Depression Screen.  Darra Lis, [place] Work [employer]  [phone]

## 2020-09-08 NOTE — Lactation Note (Addendum)
This note was copied from a baby's chart. Lactation Consultation Note  Patient Name: Danielle Garcia WYOVZ'C Date: 09/08/2020 Reason for consult: Follow-up assessment;Mother's request;Difficult latch;Early term 37-38.6wks;Maternal endocrine disorder (Valtrex prophylaxis L2) Age:31 hrs  Infant difficult time latching. LC attempted latch at the breast, but Danielle Garcia pops off. Breasts are soft and compressible. LC used 24 NS primed with 0.5 ml of Similac with Iron 20 cal/oz x2. Infant latched for about 10 minutes before tiring out. She mostly emptied NS of formula and latched on to breasts with some milk transfer but not consistently.  Mom's nipples are flat and compressible but will elongate with pre pumping. Mom did not use breast shells provided by previous LC. LC instructed Mom to use the breasts shells when she is not pumping, nursing or sleeping, mesh panty converted into mesh top she can wear to hold the breast shells. Mom no signs of breast compression or abrasion.  LC did suck training with infant and she was able to extend her tongue under the base of my finger. She would suck but intermittently. RN arrived during the visit to fix the IV and is aware of the plan listed below.  RN to provide coconut oil for nipple care.  Mom using electric pump from other insurance and declined [employer] Employee pump at this time.  Plan 1 To feed baby based on cues 8-12x in 24 hrs no more than 4 hours without an attempt.  Mom to offer breasts first, then use NS size 24 looking for signs of milk transfer.           2. Dad to paced bottle feed Similac with iron with slow flow nipple. Breastfeeding supplemenation guide provided.          3. Mom to pump using DEBP q 3 hours for 15 minutes.  Infant had 2 stools no voids. Danielle Garcia reviewed with parents how to track color change yellow to blue on the diaper for urine.  All questions answered at the end of the visit.   Maternal Data    Feeding Mother's Current Feeding  Choice: Breast Milk and Formula  LATCH Score Latch: Repeated attempts needed to sustain latch, nipple held in mouth throughout feeding, stimulation needed to elicit sucking reflex.  Audible Swallowing: A few with stimulation  Type of Nipple: Flat  Comfort (Breast/Nipple): Soft / non-tender  Hold (Positioning): Assistance needed to correctly position infant at breast and maintain latch.  LATCH Score: 6   Lactation Tools Discussed/Used Tools: Pump;Flanges;Shells;Coconut oil;Nipple Shields Nipple shield size: 24 Flange Size: 27  Interventions Interventions: Breast feeding basics reviewed;Support pillows;Education;Assisted with latch;Position options;Skin to skin;Expressed milk;Breast massage;Coconut oil;Shells;Pre-pump if needed;DEBP;Breast compression;Adjust position  Discharge    Consult Status Consult Status: Follow-up Date: 09/09/20 Follow-up type: In-patient    Danielle Garcia  Danielle Garcia 09/08/2020, 4:56 PM

## 2020-09-08 NOTE — Lactation Note (Signed)
This note was copied from a baby's chart. Lactation Consultation Note  Patient Name: Danielle Garcia [id number] Date: 09/08/2020 Reason for consult: Initial assessment;Maternal endocrine disorder (C/S delivery) Age:31 hours P2, ETI female infant. Mom latched infant on her left breast using the football hold position, infant was not sustaining latch, was off and on breast for 8 minutes. Mom was taught hand expression and infant was given 6 mls of colostrum by spoon. Mom given hand pump with 30 mm breast flange due having large breast and nipples, mom will   pre-pump breast with hand pump prior to latching infant,  due to being short shafted and nipples that flatten when compressed.  Mom knows to wear breast shells during the day in bra and not sleep in them at night. Mom will continue to work toward latching infant at the breast according to cues, 8 to 12+ times or more, STS within 24 hours. Mom knows to ask RN or LC for assistance with latching infant at the breast if needed.  LC discussed  Infant's input and out put.  Mom made aware of O/P services, breastfeeding support groups, community resources, and our phone # for post-discharge questions.  Maternal Data Has patient been taught Hand Expression?: Yes Does the patient have breastfeeding experience prior to this delivery?: Yes How long did the patient breastfeed?: Per mom, she BF her 68 year old daughter for 3 weeks  Feeding Mother's Current Feeding Choice: Breast Milk  LATCH Score Latch: Repeated attempts needed to sustain latch, nipple held in mouth throughout feeding, stimulation needed to elicit sucking reflex.  Audible Swallowing: A few with stimulation (short shafted)  Type of Nipple: Everted at rest and after stimulation  Comfort (Breast/Nipple): Soft / non-tender  Hold (Positioning): Assistance needed to correctly position infant at breast and maintain latch.  LATCH Score: 7   Lactation Tools Discussed/Used Tools:  Shells  Interventions Interventions: Breast feeding basics reviewed;Assisted with latch;Skin to skin;Hand express;Breast compression;Adjust position;Support pillows;Position options;Expressed milk;Shells;Hand pump  Discharge Pump: Personal (Mom has order BF at [facility] office with her insurance.) WIC Program: No  Consult Status Consult Status: Follow-up Date: 09/08/20 Follow-up type: In-patient    Vicente Serene 09/08/2020, 2:31 AM

## 2020-09-08 NOTE — Progress Notes (Signed)
Hypoglycemic Event  CBG: 66  Treatment: 4 oz of orange juice  Symptoms: No symptoms Follow-up CBG: Time:1229 CBG Result:76  Possible Reasons for Event: Pt received 2 units novolog sq for blood sugar 126 with breakfast meal this am.  Comments/MD notified: Sharene Skeans MD    Alison Stalling RN

## 2020-09-08 NOTE — Progress Notes (Signed)
POSTPARTUM PROGRESS NOTE  Subjective: Danielle Garcia is a 30 y.o. [id number] s/p repeat LTCS at [redacted]w[redacted]d.  She reports she doing well. No acute events overnight. She denies any problems with ambulating or po intake. Denies nausea or vomiting. She has not yet passed flatus. Foley catheter is still in place with blood-tinged urine (improved overnight). Pain is well controlled.  Lochia is minimal.  Objective: Blood pressure 126/89, pulse 96, temperature 98.3 F (36.8 C), temperature source Oral, resp. rate 18, height 5\' 3"  (1.6 m), weight 125.2 kg, last menstrual period 12/23/2019, SpO2 96 %, unknown if currently breastfeeding.  Physical Exam:  General: alert, cooperative and no distress Chest: no respiratory distress Abdomen: soft, non-tender, slight blood-tinged urine in foley bag Uterine Fundus: firm and at level of umbilicus. Provena dressing in place. Extremities: No calf swelling or tenderness  no LE edema  Recent Labs    09/07/20 0013  HGB 12.1  HCT 35.1*    Assessment/Plan: Danielle Garcia is a 30 y.o. [id number] s/p repeat LTCS at [redacted]w[redacted]d for failed TOLAC.  Routine Postpartum Care: Doing well, pain well-controlled. Will maintain foley catheter until midday given slight blood-tinged urine. Medical team with re-evaluate in the PM. -- Continue routine care, lactation support  -- Contraception: undecided s/p counseling -- Feeding: breast -- T2DM: Early A1c in pregnancy of 6.4%--no medications previously. A1c 5.8% today. Plan to continue metformin on discharge. -- cHTN: Blood pressures all wnl s/p delivery. Asymptomatic. Will continue to monitor. -- Anxiety/Depression: plan for 1 week mood check in clinic -- H/o postpartum PE  Protein S deficiency: plan to continue lovenox at 1mg /kg/day for 6 weeks in postpartum period.  Dispo: Plan for discharge POD#2-3.  Randa Ngo, MD OB Fellow, Faculty [facility] 09/08/2020 4:33 AM

## 2020-09-08 NOTE — Progress Notes (Signed)
Pt up out of bed to ambulate to bathroom.  Slow and steady gait.  Denies pain at this time.Voided without difficulty.  Peri care given with instructions.  Demonstrated understanding.  Back to bed without difficulty.

## 2020-09-08 NOTE — Anesthesia Postprocedure Evaluation (Signed)
Anesthesia Post Note  Patient: Training and development officer  Procedure(s) Performed: CESAREAN SECTION (N/A )     Patient location during evaluation: PACU Anesthesia Type: Epidural Level of consciousness: oriented and awake and alert Pain management: pain level controlled Vital Signs Assessment: post-procedure vital signs reviewed and stable Respiratory status: spontaneous breathing, respiratory function stable and patient connected to nasal cannula oxygen Cardiovascular status: blood pressure returned to baseline and stable Postop Assessment: no headache, no backache and no apparent nausea or vomiting Anesthetic complications: no   No complications documented.  Last Vitals:  Vitals:   09/08/20 0335 09/08/20 0515  BP:  126/73  Pulse:  95  Resp: 18 18  Temp: 36.8 C 36.8 C  SpO2: 96% 99%    Last Pain:  Vitals:   09/08/20 0515  TempSrc: Oral  PainSc: 4    Pain Goal:                   Hubbard Seldon L Lou Loewe

## 2020-09-09 ENCOUNTER — Other Ambulatory Visit: Payer: Self-pay | Admitting: Family Medicine

## 2020-09-09 ENCOUNTER — Other Ambulatory Visit (HOSPITAL_COMMUNITY): Payer: Self-pay | Admitting: Family Medicine

## 2020-09-09 LAB — GLUCOSE, CAPILLARY: Glucose-Capillary: 77 mg/dL (ref 70–99)

## 2020-09-09 MED ORDER — COCONUT OIL OIL: 1.0000 "application " | TOPICAL_OIL | 0 refills | Status: DC | PRN

## 2020-09-09 MED ORDER — ENOXAPARIN SODIUM 120 MG/0.8ML ~~LOC~~ SOLN: SUBCUTANEOUS | 0 refills | Status: DC

## 2020-09-09 MED ORDER — OXYCODONE HCL 5 MG PO TABS: 5.0000 mg | ORAL_TABLET | Freq: Four times a day (QID) | ORAL | 0 refills | Status: DC | PRN

## 2020-09-09 MED ORDER — METFORMIN HCL 500 MG PO TABS: 500.0000 mg | ORAL_TABLET | Freq: Two times a day (BID) | ORAL | 0 refills | Status: DC

## 2020-09-09 MED ORDER — IBUPROFEN 800 MG PO TABS: 800.0000 mg | ORAL_TABLET | Freq: Three times a day (TID) | ORAL | 0 refills | Status: DC

## 2020-09-09 MED ORDER — FERROUS SULFATE 325 (65 FE) MG PO TABS: 325.0000 mg | ORAL_TABLET | ORAL | 1 refills | Status: DC

## 2020-09-09 MED ORDER — ACETAMINOPHEN 500 MG PO TABS: 1000.0000 mg | ORAL_TABLET | Freq: Three times a day (TID) | ORAL | 0 refills | Status: DC

## 2020-09-09 MED FILL — metFORMIN HCL 500 MG TABS: 500 | 30 days supply | Qty: 60 | Fill #0

## 2020-09-09 MED FILL — oxyCODONE HCL 5 MG TABS: 5 | 3 days supply | Qty: 15 | Fill #0

## 2020-09-09 MED FILL — FERROUS SULFATE 325 MG TAB: 325 (65 FE) | 60 days supply | Qty: 30 | Fill #0

## 2020-09-09 MED FILL — IBUPROFEN 800 MG TAB: 800 | 10 days supply | Qty: 30 | Fill #0

## 2020-09-10 ENCOUNTER — Encounter: Payer: 59 | Admitting: Obstetrics & Gynecology

## 2020-09-10 NOTE — Lactation Note (Signed)
This note was copied from a baby's chart. Lactation Consultation Note  Patient Name: Danielle Garcia YOVZC'H Date: 09/10/2020 Reason for consult: Follow-up assessment;Hyperbilirubinemia;Early term 37-38.6wks;Infant weight loss;Other (Comment) (baby off the bili lights this am, for D/C this afternoon) Age:31 hours , serum Bili @ 1209 - 10.8  Baby awake and rooting, wet and stool ( transitional change) LC worked with mom to latch on the right breast,football, with depth. Per mom  Felt cramping while breast feeding and baby was still feeding.  LC reviewed supply and demand , importance of giving the baby consistent time at the breast.  LC recommended post pumping after 4 feedings a day to bring the milk coming in and supplement back tor the baby to decrease jaundice.  LC made mom aware post Hyperbilirubinemia , baby can be sluggish and important to feed 8-12 times a day STS  With feeding cues and by 3 hours.    Maternal Data    Feeding Mother's Current Feeding Choice: Breast Milk and Formula Nipple Type: Slow - flow  LATCH Score Latch: Repeated attempts needed to sustain latch, nipple held in mouth throughout feeding, stimulation needed to elicit sucking reflex.  Audible Swallowing: Spontaneous and intermittent  Type of Nipple: Everted at rest and after stimulation (compressible areola with good flow)  Comfort (Breast/Nipple): Soft / non-tender  Hold (Positioning): Assistance needed to correctly position infant at breast and maintain latch.  LATCH Score: 8   Lactation Tools Discussed/Used Tools: Shells;Pump;Flanges Flange Size: 24;27 Breast pump type: Manual;Double-Electric Breast Pump Pump Education: Milk Storage Reason for Pumping: to enhance the milk coming in so if needed mom can supplement with EBM instead of formula  Interventions Interventions: Breast feeding basics reviewed;Assisted with latch;Reverse pressure;Breast compression;Adjust position;Support  pillows;Shells;Hand pump;DEBP  Discharge Discharge Education: Engorgement and breast care;Warning signs for feeding baby;Other (comment) (mom aware of the [facility] O/P services) Pump: Personal;Manual;DEBP  Consult Status Consult Status: Complete Date: 09/10/20 Follow-up type: In-patient    [place] 09/10/2020, 2:42 PM

## 2020-09-10 NOTE — Discharge Summary (Signed)
Postpartum Discharge Summary                          Patient Name: Danielle Garcia DOB: 1990-08-29 MRN: [id number]  Date of admission: 09/07/2020 Delivery date:09/07/2020  Delivering provider: CONSTANT, PEGGY  Date of discharge: 09/10/2020  Admitting diagnosis: Encounter for induction of labor [Z34.90] Intrauterine pregnancy: [redacted]w[redacted]d    Secondary diagnosis:  Principal Problem:   Cesarean delivery delivered Active Problems:   HSV-2 (herpes simplex virus 2) infection   Essential hypertension   Asthma   History of pulmonary embolism   Diabetes mellitus during pregnancy, antepartum   Encounter for induction of labor  Additional problems: as noted above Discharge diagnosis: Cesarean Delivery Delivered                                      Post partum procedures:none Augmentation: AROM, Pitocin and IP Foley Complications: None  Hospital course: Induction of Labor With Cesarean Section   30y.o. [id number] admitted to the hospital 09/07/2020 for induction of labor. Patient had a labor course significant for arrest of dilation at 5cm. The patient went for cesarean section due to Arrest of Dilation, NRFHTs. Delivery details are as follows: Membrane Rupture Time/Date: 10:40 AM ,09/07/2020   Delivery Method:C-Section, Low Transverse  Details of operation can be found in separate operative Note.  Patient had an uncomplicated postpartum course. She is ambulating, tolerating a regular diet, passing flatus, and urinating well.  Patient is discharged home in stable condition on 09/09/20. She was discharged on metformin given her T2DM and 6 weeks of prophylactic lovenox daily given history of postpartum pulmonary embolism.  Newborn Data: Birth date:09/07/2020  Birth time:10:44 PM  Gender:Female  Living status:Living  Apgars:8 ,9  Weight:3755 g                                 Magnesium Sulfate received: No BMZ received: No Rhophylac:N/A MMR:N/A T-DaP:offered prior to  discharge Flu: offered prior to discharge Transfusion:No  Physical exam  Vitals:   09/08/20 0958 09/08/20 1340 09/08/20 2111 09/09/20 0530  BP: 109/60 129/65 139/62 127/67  Pulse: 81 84 91 80  Resp: 16 16 18 18   Temp: 97.7 F (36.5 C) 98.3 F (36.8 C) 98.2 F (36.8 C) 98 F (36.7 C)  TempSrc: Oral Oral Axillary Oral  SpO2: 99% 98% 100% 100%  Weight:      Height:       General: alert, cooperative and no distress Lochia: appropriate Uterine Fundus: firm Incision: Healing well with no significant drainage, No significant erythema, Dressing is clean, dry, and intact DVT Evaluation: No evidence of DVT seen on physical exam. No cords or calf tenderness. No significant calf/ankle edema. Labs: Recent Labs       Lab Results  Component Value Date   WBC 20.6 (H) 09/08/2020   HGB 10.1 (L) 09/08/2020   HCT 30.9 (L) 09/08/2020   MCV 90.1 09/08/2020   PLT 249 09/08/2020     CMP Latest Ref Rng & Units 09/08/2020  Glucose 70 - 99 mg/dL 72  BUN 6 - 20 mg/dL 7  Creatinine 0.44 - 1.00 mg/dL 0.64  Sodium 135 - 145 mmol/L 137  Potassium 3.5 - 5.1 mmol/L 4.0  Chloride 98 - 111 mmol/L 105  CO2 22 - 32 mmol/L  24  Calcium 8.9 - 10.3 mg/dL 8.6(L)  Total Protein 6.5 - 8.1 g/dL -  Total Bilirubin 0.3 - 1.2 mg/dL -  Alkaline Phos 38 - 126 U/L -  AST 15 - 41 U/L -  ALT 0 - 44 U/L -   Edinburgh Score: Edinburgh Postnatal Depression Scale Screening Tool 09/08/2020  I have been able to laugh and see the funny side of things. (No Data)     After visit meds:  Allergies as of 09/09/2020   No Known Allergies        Medication List    STOP taking these medications   aspirin EC 81 MG tablet   blood glucose meter kit and supplies   calcium carbonate 500 MG chewable tablet Commonly known as: TUMS - dosed in mg elemental calcium   Comfort Fit Maternity Supp Med Misc   cyclobenzaprine 10 MG tablet Commonly known as: FLEXERIL   Dexcom G6 Receiver Devi    Dexcom G6 Sensor Misc   Dexcom G6 Transmitter Misc   mometasone 50 MCG/ACT nasal spray Commonly known as: Nasonex   pantoprazole 40 MG tablet Commonly known as: PROTONIX   valACYclovir 500 MG tablet Commonly known as: VALTREX   Wrist Splint/Cock-Up/Left M Misc     TAKE these medications   acetaminophen 500 MG tablet Commonly known as: TYLENOL Take 2 tablets (1,000 mg total) by mouth every 8 (eight) hours. What changed:   when to take this  reasons to take this   coconut oil Oil Apply 1 application topically as needed (nipple pain).   enoxaparin 120 MG/0.8ML injection Commonly known as: LOVENOX Inject 157m into the skin daily What changed:   how much to take  how to take this  when to take this  additional instructions   ferrous sulfate 325 (65 FE) MG tablet Take 1 tablet (325 mg total) by mouth every other day. Start taking on: September 10, 2020   freestyle lancets USE TO CHECK BLOOD SUGAR UP TO 4 TIMES A DAY AS DIRECTED   FREESTYLE LITE test strip Generic drug: glucose blood USE TO CHECK BLOOD SUGAR UP TO 4 TIMES A DAY AS DIRECTED   ibuprofen 800 MG tablet Commonly known as: ADVIL Take 1 tablet (800 mg total) by mouth every 8 (eight) hours.   metFORMIN 500 MG tablet Commonly known as: Glucophage Take 1 tablet (500 mg total) by mouth 2 (two) times daily with a meal.   oxyCODONE 5 MG immediate release tablet Commonly known as: Oxy IR/ROXICODONE Take 1 tablet (5 mg total) by mouth every 6 (six) hours as needed for severe pain or breakthrough pain.   Prenatal Vitamin 27-0.8 MG Tabs Take 1 tablet by mouth daily.       Discharge home in stable condition Infant Feeding: Breast Infant Disposition:home with mother Discharge instruction: per After Visit Summary and Postpartum booklet. Activity: Advance as tolerated. Pelvic rest for 6 weeks.  Diet: routine diet Future Appointments:       Future Appointments  Date Time  Provider DOak Level 09/16/2020 10:45 AM STruett Mainland DO [facility] None  10/14/2020 10:30 AM STruett Mainland DO [facility] None   Follow up Visit: Message sent [facility] on 09/08/20 to schedule PP appt.  Please schedule this patient for a In person postpartum visit in 6 weeks with the following provider: MD. Additional Postpartum F/U:Postpartum Depression checkup, Incision check 1 week and BP check 1 week  High risk pregnancy complicated by: now h/o Cesarean  x2, cHTN (no meds), T2DM, h/o postpartum PE (on lovenox in pregnancy), Protein S deficiency, HSV (on valtrex), Anxiety/Depression (no meds) Delivery mode: C-Section, Low Transverse  Anticipated Birth Control: Unsure  Kodee Ravert, Gildardo Cranker, MD OB Fellow, Faculty [facility] 09/10/2020 12:13 PM

## 2020-09-12 ENCOUNTER — Encounter (HOSPITAL_BASED_OUTPATIENT_CLINIC_OR_DEPARTMENT_OTHER): Payer: Self-pay | Admitting: Emergency Medicine

## 2020-09-12 ENCOUNTER — Other Ambulatory Visit: Payer: Self-pay

## 2020-09-12 ENCOUNTER — Emergency Department (HOSPITAL_BASED_OUTPATIENT_CLINIC_OR_DEPARTMENT_OTHER): Payer: 59

## 2020-09-12 ENCOUNTER — Emergency Department (HOSPITAL_BASED_OUTPATIENT_CLINIC_OR_DEPARTMENT_OTHER)
Admission: EM | Admit: 2020-09-12 | Discharge: 2020-09-12 | Disposition: A | Discharge status: Home / Self Care | Payer: 59 | Attending: Emergency Medicine | Admitting: Emergency Medicine

## 2020-09-12 DIAGNOSIS — O99511 Diseases of the respiratory system complicating pregnancy, first trimester: Secondary | ICD-10-CM | POA: Diagnosis not present

## 2020-09-12 DIAGNOSIS — Z20822 Contact with and (suspected) exposure to covid-19: Secondary | ICD-10-CM | POA: Insufficient documentation

## 2020-09-12 DIAGNOSIS — E119 Type 2 diabetes mellitus without complications: Secondary | ICD-10-CM | POA: Diagnosis not present

## 2020-09-12 DIAGNOSIS — Z7984 Long term (current) use of oral hypoglycemic drugs: Secondary | ICD-10-CM | POA: Diagnosis not present

## 2020-09-12 DIAGNOSIS — R0602 Shortness of breath: Secondary | ICD-10-CM | POA: Insufficient documentation

## 2020-09-12 DIAGNOSIS — R0789 Other chest pain: Secondary | ICD-10-CM | POA: Diagnosis not present

## 2020-09-12 DIAGNOSIS — R062 Wheezing: Secondary | ICD-10-CM

## 2020-09-12 DIAGNOSIS — J45909 Unspecified asthma, uncomplicated: Secondary | ICD-10-CM | POA: Insufficient documentation

## 2020-09-12 DIAGNOSIS — I10 Essential (primary) hypertension: Secondary | ICD-10-CM | POA: Insufficient documentation

## 2020-09-12 DIAGNOSIS — R059 Cough, unspecified: Secondary | ICD-10-CM | POA: Diagnosis not present

## 2020-09-12 DIAGNOSIS — O9089 Other complications of the puerperium, not elsewhere classified: Secondary | ICD-10-CM | POA: Insufficient documentation

## 2020-09-12 LAB — SARS CORONAVIRUS 2 BY RT PCR (HOSPITAL ORDER, PERFORMED IN ~~LOC~~ HOSPITAL LAB): SARS Coronavirus 2: NEGATIVE

## 2020-09-12 MED ORDER — ALBUTEROL SULFATE HFA 108 (90 BASE) MCG/ACT IN AERS
1.0000 | INHALATION_SPRAY | Freq: Once | RESPIRATORY_TRACT | Status: DC | PRN
  Filled 2020-09-12: qty 6.7

## 2020-09-12 MED ORDER — AZITHROMYCIN 250 MG PO TABS: 250.0000 mg | ORAL_TABLET | Freq: Every day | ORAL | 0 refills | Status: DC

## 2020-09-12 MED ORDER — ALBUTEROL SULFATE HFA 108 (90 BASE) MCG/ACT IN AERS: 2.0000 | INHALATION_SPRAY | Freq: Once | RESPIRATORY_TRACT | Status: DC

## 2020-09-12 MED ORDER — ALBUTEROL SULFATE (2.5 MG/3ML) 0.083% IN NEBU: 2.5000 mg | INHALATION_SOLUTION | Freq: Four times a day (QID) | RESPIRATORY_TRACT | 12 refills | Status: DC | PRN

## 2020-09-12 MED ORDER — ALBUTEROL SULFATE (2.5 MG/3ML) 0.083% IN NEBU
5.0000 mg | INHALATION_SOLUTION | Freq: Once | RESPIRATORY_TRACT | Status: AC
  Administered 2020-09-12: 5 mg via RESPIRATORY_TRACT
  Filled 2020-09-12: qty 6

## 2020-09-12 MED ORDER — AEROCHAMBER PLUS FLO-VU LARGE MISC
1.0000 | Freq: Once | Status: DC
  Filled 2020-09-12: qty 1

## 2020-09-12 NOTE — ED Notes (Signed)
Pt discharged to home. Discharge instructions have been discussed with patient and/or family members. Pt verbally acknowledges understanding d/c instructions, and endorses comprehension to checkout at registration before leaving.  °

## 2020-09-12 NOTE — ED Triage Notes (Signed)
Pt arrives pov with driver, c/o CP and shob with cough x 2 days. Pt endorses c-section on Tuesday

## 2020-09-12 NOTE — ED Notes (Signed)
She smilingly tells me she feels "much better".

## 2020-09-12 NOTE — ED Provider Notes (Signed)
[facility] EMERGENCY DEPARTMENT Provider Note   CSN: [id number] Arrival date & time: 09/12/20  9030     History Chief Complaint  Patient presents with  . Shortness of Breath    Danielle Garcia is a 30 y.o. female past medical history of PE, hypertension, asthma, diabetes, presenting to the emergency department with complaint of wheezing and shortness of breath began yesterday.  She states she feels as though she has congestion in her throat though is unable to cough it up.  She is having some wheezing and chest tightness that feels similar to asthma.  She also feels little bit more winded with ambulation. Per review of medical record, Patient had C-section on 09/07/2020 at [redacted] weeks gestation.  Her wound is doing well, she states they placed a wound VAC due to the location of the surgical incision in a skin fold.  She has history of PE that occurred during the postpartum period of her last delivery.  She has been taking Lovenox since [redacted] weeks gestation of this recent pregnancy.  She has not missed any doses since discharge from the hospital on the 25th.  She is not having any symptoms of blood clot. No known Covid exposures.  States she had Covid last year around this time.  The history is provided by the patient and medical records.       Past Medical History:  Diagnosis Date  . Anxiety   . Asthma    as a child  . Depression    doing ok now  . Diabetes mellitus without complication ([place])   . Fibroid   . Floaters   . GERD (gastroesophageal reflux disease)   . HTN (hypertension)    2014/15 no meds since then  . Infection    UTI  . Migraine   . Pre-diabetes   . Pulmonary embolus [facility])    after first delivery    Patient Active Problem List   Diagnosis Date Noted  . Cesarean delivery delivered 09/08/2020  . Encounter for induction of labor 09/07/2020  . GBS bacteriuria 09/06/2020  . Excessive fetal growth affecting management of mother in third trimester,  antepartum 08/17/2020  . Preterm labor in third trimester without delivery 07/30/2020  . Diabetes mellitus during pregnancy, antepartum 03/12/2020  . Supervision of high risk pregnancy, antepartum 02/17/2020  . History of pulmonary embolism 02/17/2020  . Asthma 10/07/2019  . Vitamin D deficiency 08/04/2019  . Morbid obesity ([place]) 02/07/2019  . Gastroesophageal reflux disease 11/27/2018  . Back pain 09/25/2018  . Urinary frequency 09/25/2018  . Other fatigue 02/05/2018  . Shortness of breath on exertion 02/05/2018  . Migraine 02/05/2018  . Essential hypertension 02/05/2018  . 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 CESAREAN SECTION    . CESAREAN SECTION N/A 09/07/2020   Procedure: CESAREAN SECTION;  Surgeon: Mora Bellman, MD;  Location: [facility];  Service: Obstetrics;  Laterality: N/A;  . DILATION AND EVACUATION N/A 07/02/2018   Procedure: DILATATION AND EVACUATION;  Surgeon: Aletha Halim, MD;  Location: [facility];  Service: Gynecology;  Laterality: N/A;  . WISDOM TOOTH EXTRACTION       OB History    Gravida  3   Para  2   Term  2   Preterm      AB  1   Living  2     SAB  1   IAB      Ectopic  Multiple  0   Live Births  2        Obstetric Comments  9#, "wouldn't go into birth canal"        Family History  Problem Relation Age of Onset  . Hypertension Mother   . Heart attack Father 54  . Obesity Father   . Hypertension Maternal Grandmother   . Diabetes Maternal Grandmother   . Lung cancer Maternal Grandfather   . Diabetes Paternal Grandmother   . Allergies Daughter     Social History   Tobacco Use  . Smoking status: Never Smoker  . Smokeless tobacco: Never Used  Vaping Use  . Vaping Use: Never used  Substance Use Topics  . Alcohol use: Not Currently    Comment: occasionally  . Drug use: No    Home Medications Prior to Admission medications   Medication Sig  Start Date End Date Taking? Authorizing Provider  albuterol (PROVENTIL) (2.5 MG/3ML) 0.083% nebulizer solution Take 3 mLs (2.5 mg total) by nebulization every 6 (six) hours as needed for wheezing or shortness of breath. 09/12/20  Yes Quentin Cornwall, [place] N, PA-C  azithromycin (ZITHROMAX) 250 MG tablet Take 1 tablet (250 mg total) by mouth daily. Take first 2 tablets together, then 1 every day until finished. 09/12/20  Yes Robinson, [place] N, PA-C  acetaminophen (TYLENOL) 500 MG tablet Take 2 tablets (1,000 mg total) by mouth every 8 (eight) hours. 09/09/20   Alcus Dad, MD  coconut oil OIL Apply 1 application topically as needed (nipple pain). 09/09/20   Alcus Dad, MD  enoxaparin (LOVENOX) 120 MG/0.8ML injection Inject 120mg  into the skin daily 09/09/20   Alcus Dad, MD  ferrous sulfate 325 (65 FE) MG tablet Take 1 tablet (325 mg total) by mouth every other day. 09/10/20   Alcus Dad, MD  FREESTYLE LITE test strip USE TO CHECK BLOOD SUGAR UP TO 4 TIMES A DAY AS DIRECTED 08/17/20   Megan Salon, MD  ibuprofen (ADVIL) 800 MG tablet Take 1 tablet (800 mg total) by mouth every 8 (eight) hours. 09/09/20   Alcus Dad, MD  Lancets (FREESTYLE) lancets USE TO CHECK BLOOD SUGAR UP TO 4 TIMES A DAY AS DIRECTED 08/17/20   Megan Salon, MD  metFORMIN (GLUCOPHAGE) 500 MG tablet Take 1 tablet (500 mg total) by mouth 2 (two) times daily with a meal. 09/09/20   Alcus Dad, MD  oxyCODONE (OXY IR/ROXICODONE) 5 MG immediate release tablet Take 1 tablet (5 mg total) by mouth every 6 (six) hours as needed for severe pain or breakthrough pain. 09/09/20   Alcus Dad, MD  Prenatal Vit-Fe Fumarate-FA (PRENATAL VITAMIN) 27-0.8 MG TABS Take 1 tablet by mouth daily. 02/07/20   Randa Ngo, MD    Allergies    Patient has no known allergies.  Review of Systems   Review of Systems  Constitutional: Negative for chills and fever.  Respiratory: Positive for chest tightness, shortness of breath and  wheezing.   Cardiovascular: Negative for chest pain and leg swelling.  All other systems reviewed and are negative.   Physical Exam Updated Vital Signs BP 122/78   Pulse 94   Temp 98.2 F (36.8 C) (Oral)   Resp 18   Ht 5\' 3"  (1.6 m)   Wt 126.3 kg   SpO2 99%   BMI 49.33 kg/m   Physical Exam Vitals and nursing note reviewed.  Constitutional:      General: She is not in acute distress.    Appearance: She is  well-developed and well-nourished. She is obese. She is not ill-appearing.  HENT:     Head: Normocephalic and atraumatic.  Eyes:     Conjunctiva/sclera: Conjunctivae normal.  Cardiovascular:     Rate and Rhythm: Normal rate and regular rhythm.  Pulmonary:     Effort: Pulmonary effort is normal. No tachypnea or respiratory distress.     Comments: Speaking in full sentences with normal work of breathing.  Lung sounds diminished bilaterally with expiratory wheezes Abdominal:     Palpations: Abdomen is soft.  Skin:    General: Skin is warm.  Neurological:     Mental Status: She is alert.  Psychiatric:        Mood and Affect: Mood and affect normal.        Behavior: Behavior normal.     ED Results / Procedures / Treatments   Labs (all labs ordered are listed, but only abnormal results are displayed) Labs Reviewed  SARS CORONAVIRUS 2 (HOSPITAL ORDER, [place] [facility])    EKG EKG Interpretation  Date/Time:  Sunday September 12 2020 09:29:23 EST Ventricular Rate:  75 PR Interval:  152 QRS Duration: 74 QT Interval:  388 QTC Calculation: 433 R Axis:   82 Text Interpretation: Normal sinus rhythm with sinus arrhythmia Normal ECG No significant change since last tracing Confirmed by Calvert Cantor [phone]) on 09/12/2020 10:13:14 AM   Radiology DG Chest 2 View  Result Date: 09/12/2020 CLINICAL DATA:  30 year old female with history of asthma presenting with wheezing. EXAM: CHEST - 2 VIEW COMPARISON:  Chest x-ray 10/07/2019. FINDINGS:  Ill-defined opacities and areas of interstitial prominence are noted throughout the mid to lower lungs bilaterally (right greater than left). No pleural effusions. No pneumothorax. No evidence of pulmonary edema. No definite suspicious appearing pulmonary nodules or masses are noted. Heart size is normal. Upper mediastinal contours are within normal limits. IMPRESSION: 1. Subtle findings concerning for developing multilobar bilateral pneumonia. Clinical correlation for signs and symptoms of viral infection is suggested. Electronically Signed   By: Vinnie Langton M.D.   On: 09/12/2020 11:39    Procedures Procedures   Medications Ordered in ED Medications  AeroChamber Plus Flo-Vu Large MISC 1 each (has no administration in time range)  albuterol (VENTOLIN HFA) 108 (90 Base) MCG/ACT inhaler 1-2 puff (has no administration in time range)  albuterol (PROVENTIL) (2.5 MG/3ML) 0.083% nebulizer solution 5 mg (5 mg Nebulization Given 09/12/20 1041)    ED Course  I have reviewed the triage vital signs and the nursing notes.  Pertinent labs & imaging results that were available during my care of the patient were reviewed by me and considered in my medical decision making (see chart for details).  Clinical Course as of 09/12/20 1430  Sun Sep 12, 2020  1249 Repeat lung exam with some better air movement.  She reports feeling much better after nebulized albuterol.  Still has some faint wheezes, additional 2 puffs of albuterol given discussed chest x-ray findings.  Pending Covid swab for treatment plan [JR]    Clinical Course User Index [JR] Robinson, [place] N, PA-C   MDM Rules/Calculators/A&P                          Patient is 5 days postpartum after uncomplicated C-section delivery, presenting with wheezing, shortness of breath and cough since yesterday. She states she is feeling somewhat winded with ambulation though is not short of breath at rest. She has also been  wheezing. She notes history of  asthma and this feels somewhat similar, however considering history of postpartum PE during her last pregnancy she wanted to be sure there was not more serious cause. She has been taking Lovenox since week 12 of pregnancy, and has been compliant since then. She has not missed any doses since leaving the hospital 2 days ago. She has no symptoms of DVT. On examination she is speaking full sentences with normal respiratory rate and effort. She has diminished lung sounds bilaterally with wheezes noted. She is afebrile. She is treated with albuterol neb with improvement in both lung exam and symptoms. Chest x-ray with nonspecific findings. Covid swab is negative. Considering chest x-ray read, will cover with Z-Pak. She is discharged with albuterol to use as needed and close PCP follow-up. Low suspicion for PE as she is compliant on anticoagulation, vital signs and presentation are not consistent with PE. Patient is in agreement with care plan to treat symptomatically. Strict return precautions discussed. She is discharged in no acute distress.  Discussed results, findings, treatment and follow up. Patient advised of return precautions. Patient verbalized understanding and agreed with plan.  Final Clinical Impression(s) / ED Diagnoses Final diagnoses:  Shortness of breath  Wheezing    Rx / DC Orders ED Discharge Orders         Ordered    albuterol (PROVENTIL) (2.5 MG/3ML) 0.083% nebulizer solution  Every 6 hours PRN        09/12/20 1419    azithromycin (ZITHROMAX) 250 MG tablet  Daily        09/12/20 1419           Robinson, [place] N, [place] 09/12/20 1434    Truddie Hidden, MD 09/13/20 [phone]

## 2020-09-12 NOTE — Discharge Instructions (Addendum)
Your Covid test is negative. Please take the antibiotic, azithromycin, as directed until gone. Follow closely with your primary care provider. You can use the albuterol inhaler every 4-6 hours as needed for wheezing or chest tightness. You can also use the albuterol nebulizer as needed Return the emergency department if you develop persistent worsening shortness of breath, sharp pain in her chest with breathing, or other concerning symptoms.

## 2020-09-12 NOTE — ED Notes (Signed)
IV removed from [facility]

## 2020-09-15 ENCOUNTER — Other Ambulatory Visit: Payer: Self-pay | Admitting: Family Medicine

## 2020-09-15 ENCOUNTER — Encounter: Payer: Self-pay | Admitting: Family Medicine

## 2020-09-15 ENCOUNTER — Ambulatory Visit (INDEPENDENT_AMBULATORY_CARE_PROVIDER_SITE_OTHER): Payer: 59 | Admitting: Family Medicine

## 2020-09-15 ENCOUNTER — Other Ambulatory Visit: Payer: Self-pay

## 2020-09-15 VITALS — BP 158/90 | HR 79 | Temp 98.4°F | Ht 63.0 in | Wt 264.0 lb

## 2020-09-15 DIAGNOSIS — J4521 Mild intermittent asthma with (acute) exacerbation: Secondary | ICD-10-CM | POA: Diagnosis not present

## 2020-09-15 MED ORDER — ALBUTEROL SULFATE HFA 108 (90 BASE) MCG/ACT IN AERS: 2.0000 | INHALATION_SPRAY | Freq: Four times a day (QID) | RESPIRATORY_TRACT | 0 refills | Status: DC | PRN

## 2020-09-15 MED ORDER — PREDNISONE 20 MG PO TABS: 40.0000 mg | ORAL_TABLET | Freq: Every day | ORAL | 0 refills | Status: DC

## 2020-09-15 MED ORDER — METHYLPREDNISOLONE ACETATE 80 MG/ML IJ SUSP
80.0000 mg | Freq: Once | INTRAMUSCULAR | Status: AC
Start: 2020-09-15 — End: 2020-09-15
  Administered 2020-09-15: 80 mg via INTRAMUSCULAR

## 2020-09-15 MED FILL — ALBUTEROL SULFATE HFA 108 (: 108 (90 BAS | 25 days supply | Qty: 18 | Fill #0

## 2020-09-15 NOTE — Progress Notes (Signed)
Chief Complaint  Patient presents with  . Asthma    Marylou Flesher here for URI complaints.  Duration: 4 days  Associated symptoms: wheezing, shortness of breath, chest tightness and coughing Denies: sinus congestion, sinus pain, rhinorrhea, itchy watery eyes, ear pain, ear drainage, sore throat, myalgia and fevers Treatment to date: albuterol Sick contacts: No  She has a history of asthma.  She went to the ED on 2/27 and PE was ruled out.  She was given a Z-Pak which has not helped.  Past Medical History:  Diagnosis Date  . Anxiety   . Asthma    as a child  . Depression    doing ok now  . Diabetes mellitus without complication ([place])   . Fibroid   . Floaters   . GERD (gastroesophageal reflux disease)   . HTN (hypertension)    2014/15 no meds since then  . Infection    UTI  . Migraine   . Pre-diabetes   . Pulmonary embolus ([facility])    after first delivery    BP (!) 158/90 (BP Location: Left Arm)   Pulse 79   Temp 98.4 F (36.9 C) (Oral)   Ht 5\' 3"  (1.6 m)   Wt 264 lb (119.7 kg)   SpO2 94%   BMI 46.77 kg/m  General: Awake, alert, appears stated age HEENT: AT, [place], ears patent b/l and TM's neg, nares patent w/o discharge, pharynx pink and without exudates, MMM Neck: No masses or asymmetry Heart: RRR Lungs: CTAB, no accessory muscle use; breath sounds are slightly diminished and I appreciate no current wheezing Psych: Age appropriate judgment and insight, normal mood and affect  Mild intermittent asthma with acute exacerbation - Plan: predniSONE (DELTASONE) 20 MG tablet, albuterol (VENTOLIN HFA) 108 (90 Base) MCG/ACT inhaler  Depo injection today, if she does not improve with continuing routine nebulization treatments and as needed albuterol inhalation, she will start a 5-day course of prednisone tomorrow, 40 mg daily.  Doubt infection due to relief with albuterol and lack of efficacy of Z-Pak. Continue to push fluids, practice good hand hygiene, cover mouth when  coughing. F/u prn. If starting to experience fevers, shaking, or shortness of breath, seek immediate care. Pt voiced understanding and agreement to the plan.  [place], DO 09/15/20 3:33 PM

## 2020-09-15 NOTE — Patient Instructions (Signed)
Pump and dump for the next week.  If the shot works, no need to take the prednisone.   Take the inhaler as needed every 2 hours. Use the nebulizer every 4 hours scheduled while awake.   Let us know if you need anything.

## 2020-09-15 NOTE — Addendum Note (Signed)
Addended by: Sharon Seller B on: 09/15/2020 03:55 PM   Modules accepted: Orders

## 2020-09-16 ENCOUNTER — Other Ambulatory Visit: Payer: Self-pay | Admitting: Family Medicine

## 2020-09-16 ENCOUNTER — Ambulatory Visit (INDEPENDENT_AMBULATORY_CARE_PROVIDER_SITE_OTHER): Payer: 59 | Admitting: Family Medicine

## 2020-09-16 ENCOUNTER — Encounter: Payer: Self-pay | Admitting: Family Medicine

## 2020-09-16 DIAGNOSIS — Z0130 Encounter for examination of blood pressure without abnormal findings: Secondary | ICD-10-CM

## 2020-09-16 MED ORDER — NIFEDIPINE ER OSMOTIC RELEASE 30 MG PO TB24: 30.0000 mg | ORAL_TABLET | Freq: Every day | ORAL | 2 refills | Status: DC

## 2020-09-16 MED FILL — NIFEDIPINE ER 30 MG TABLET: 30 | 15 days supply | Qty: 30 | Fill #0

## 2020-09-16 NOTE — Progress Notes (Signed)
Patient presents for one week incision check. Patient has wound vac in place. Patient scored 10 on [place] screening. Kathrene Alu RN

## 2020-09-16 NOTE — Progress Notes (Signed)
   Subjective:    Patient ID: Danielle Garcia, female    DOB: 1991/06/16, 30 y.o.   MRN: [id number]  HPI  Seen for wound check. Has elevated [place] at 10. No problems with wound vac.  Review of Systems    BP (!) 158/89   Pulse 78   Ht 5\' 3"  (1.6 m)   Wt 260 lb (117.9 kg)   Breastfeeding Yes   BMI 46.06 kg/m   Objective:   Physical Exam Vitals and nursing note reviewed. Exam conducted with a chaperone present.  Abdominal:     Comments: Wound vac removed. Incision healing nicely. No erythema, discharge, or evidence of dehiscence.        Assessment & Plan:  1. BP check BP elevated - Start procardia.  2. Postpartum care and examination Incision healing well.

## 2020-09-17 ENCOUNTER — Other Ambulatory Visit: Payer: Self-pay | Admitting: Family Medicine

## 2020-09-17 ENCOUNTER — Ambulatory Visit: Payer: 59 | Admitting: Family Medicine

## 2020-09-17 MED ORDER — ALBUTEROL SULFATE (2.5 MG/3ML) 0.083% IN NEBU: 2.5000 mg | INHALATION_SOLUTION | Freq: Four times a day (QID) | RESPIRATORY_TRACT | 1 refills | Status: DC | PRN

## 2020-09-19 ENCOUNTER — Emergency Department (HOSPITAL_BASED_OUTPATIENT_CLINIC_OR_DEPARTMENT_OTHER)
Admission: EM | Admit: 2020-09-19 | Discharge: 2020-09-19 | Disposition: A | Discharge status: Home / Self Care | Payer: 59 | Attending: Emergency Medicine | Admitting: Emergency Medicine

## 2020-09-19 ENCOUNTER — Other Ambulatory Visit: Payer: Self-pay

## 2020-09-19 ENCOUNTER — Encounter (HOSPITAL_BASED_OUTPATIENT_CLINIC_OR_DEPARTMENT_OTHER): Payer: Self-pay

## 2020-09-19 DIAGNOSIS — R519 Headache, unspecified: Secondary | ICD-10-CM | POA: Diagnosis not present

## 2020-09-19 DIAGNOSIS — E876 Hypokalemia: Secondary | ICD-10-CM | POA: Diagnosis not present

## 2020-09-19 DIAGNOSIS — I10 Essential (primary) hypertension: Secondary | ICD-10-CM | POA: Insufficient documentation

## 2020-09-19 DIAGNOSIS — J45909 Unspecified asthma, uncomplicated: Secondary | ICD-10-CM | POA: Insufficient documentation

## 2020-09-19 DIAGNOSIS — Z79899 Other long term (current) drug therapy: Secondary | ICD-10-CM | POA: Diagnosis not present

## 2020-09-19 DIAGNOSIS — E119 Type 2 diabetes mellitus without complications: Secondary | ICD-10-CM | POA: Diagnosis not present

## 2020-09-19 DIAGNOSIS — Z7984 Long term (current) use of oral hypoglycemic drugs: Secondary | ICD-10-CM | POA: Diagnosis not present

## 2020-09-19 LAB — CBC WITH DIFFERENTIAL/PLATELET
Abs Immature Granulocytes: 0.03 10*3/uL (ref 0.00–0.07)
Basophils Absolute: 0 10*3/uL (ref 0.0–0.1)
Basophils Relative: 0 %
Eosinophils Absolute: 0.2 10*3/uL (ref 0.0–0.5)
Eosinophils Relative: 3 %
HCT: 37.2 % (ref 36.0–46.0)
Hemoglobin: 12 g/dL (ref 12.0–15.0)
Immature Granulocytes: 1 %
Lymphocytes Relative: 38 %
Lymphs Abs: 2.4 10*3/uL (ref 0.7–4.0)
MCH: 29.1 pg (ref 26.0–34.0)
MCHC: 32.3 g/dL (ref 30.0–36.0)
MCV: 90.3 fL (ref 80.0–100.0)
Monocytes Absolute: 0.6 10*3/uL (ref 0.1–1.0)
Monocytes Relative: 10 %
Neutro Abs: 3 10*3/uL (ref 1.7–7.7)
Neutrophils Relative %: 48 %
Platelets: 564 10*3/uL — ABNORMAL HIGH (ref 150–400)
RBC: 4.12 MIL/uL (ref 3.87–5.11)
RDW: 13.6 % (ref 11.5–15.5)
WBC: 6.4 10*3/uL (ref 4.0–10.5)
nRBC: 0 % (ref 0.0–0.2)

## 2020-09-19 LAB — URINALYSIS, ROUTINE W REFLEX MICROSCOPIC
Bilirubin Urine: NEGATIVE
Glucose, UA: NEGATIVE mg/dL
Ketones, ur: NEGATIVE mg/dL
Leukocytes,Ua: NEGATIVE
Nitrite: NEGATIVE
Protein, ur: NEGATIVE mg/dL
Specific Gravity, Urine: 1.015 (ref 1.005–1.030)
pH: 7.5 (ref 5.0–8.0)

## 2020-09-19 LAB — COMPREHENSIVE METABOLIC PANEL
ALT: 16 U/L (ref 0–44)
AST: 18 U/L (ref 15–41)
Albumin: 3.6 g/dL (ref 3.5–5.0)
Alkaline Phosphatase: 62 U/L (ref 38–126)
Anion gap: 12 (ref 5–15)
BUN: 9 mg/dL (ref 6–20)
CO2: 23 mmol/L (ref 22–32)
Calcium: 9 mg/dL (ref 8.9–10.3)
Chloride: 105 mmol/L (ref 98–111)
Creatinine, Ser: 0.61 mg/dL (ref 0.44–1.00)
GFR, Estimated: >60 mL/min (ref >60)
Glucose, Bld: 87 mg/dL (ref 70–99)
Potassium: 3.2 mmol/L — ABNORMAL LOW (ref 3.5–5.1)
Sodium: 140 mmol/L (ref 135–145)
Total Bilirubin: 0.4 mg/dL (ref 0.3–1.2)
Total Protein: 7.3 g/dL (ref 6.5–8.1)

## 2020-09-19 LAB — URINALYSIS, MICROSCOPIC (REFLEX)

## 2020-09-19 MED ORDER — KETOROLAC TROMETHAMINE 15 MG/ML IJ SOLN
15.0000 mg | Freq: Once | INTRAMUSCULAR | Status: AC
  Administered 2020-09-19: 15 mg via INTRAVENOUS
  Filled 2020-09-19: qty 1

## 2020-09-19 MED ORDER — POTASSIUM CHLORIDE CRYS ER 20 MEQ PO TBCR
40.0000 meq | EXTENDED_RELEASE_TABLET | Freq: Once | ORAL | Status: AC
  Administered 2020-09-19: 40 meq via ORAL
  Filled 2020-09-19: qty 2

## 2020-09-19 MED ORDER — METOCLOPRAMIDE HCL 5 MG/ML IJ SOLN
10.0000 mg | Freq: Once | INTRAMUSCULAR | Status: DC
  Filled 2020-09-19: qty 2

## 2020-09-19 MED ORDER — SODIUM CHLORIDE 0.9 % IV BOLUS
1000.0000 mL | Freq: Once | INTRAVENOUS | Status: AC
  Administered 2020-09-19: 1000 mL via INTRAVENOUS

## 2020-09-19 MED ORDER — DIPHENHYDRAMINE HCL 50 MG/ML IJ SOLN
25.0000 mg | Freq: Once | INTRAMUSCULAR | Status: AC
  Administered 2020-09-19: 25 mg via INTRAVENOUS
  Filled 2020-09-19: qty 1

## 2020-09-19 MED ORDER — PROMETHAZINE HCL 25 MG/ML IJ SOLN
12.5000 mg | Freq: Once | INTRAMUSCULAR | Status: AC
  Administered 2020-09-19: 12.5 mg via INTRAVENOUS
  Filled 2020-09-19: qty 1

## 2020-09-19 NOTE — ED Triage Notes (Signed)
Pt Here POV from Home with HTN and Headaches.  Headache since 1 day ago.   HTN since Tuesday, Highest was 199/117 at home. Pt prescribed Nifedipine.   A&Ox4. GCS 15.

## 2020-09-19 NOTE — ED Provider Notes (Signed)
[facility] Provider Note: Danielle Spurling, MD, FACEP  CSN: [id number] MRN: [id number] ARRIVAL: 09/19/20 at [place]: [place]  Headache   HISTORY OF PRESENT ILLNESS  09/19/20 3:00 AM Danielle Garcia is a 30 y.o. female who gave birth by C-section 09/07/2020 due to failure to dilate.  Her postpartum course has been largely uneventful until yesterday when she developed a headache.  The headache is frontal and behind her eyes.  She describes it as pressure-like and throbbing and rates it as an 8 out of 10.  She checked her blood pressure at home and found her to be as high as 199/117 and contacted her PCP.  He started her on nifedipine yesterday.  She has been taking Tylenol for her headaches without relief.  She has had no associated nausea, vomiting, visual changes, photophobia, edema or abdominal pain apart from discomfort at her C-section incision.  Her blood pressure here was 159/90, rechecked 142/90.   Past Medical History:  Diagnosis Date  . Anxiety   . Asthma    as a child  . Depression    doing ok now  . Diabetes mellitus without complication ([place])   . Fibroid   . Floaters   . GERD (gastroesophageal reflux disease)   . HTN (hypertension)    2014/15 no meds since then  . Infection    UTI  . Migraine   . Pre-diabetes   . Pulmonary embolus ([place])    after first delivery    Past Surgical History:  Procedure Laterality Date  . CESAREAN SECTION    . CESAREAN SECTION N/A 09/07/2020   Procedure: CESAREAN SECTION;  Surgeon: Mora Bellman, MD;  Location: [facility];  Service: Obstetrics;  Laterality: N/A;  . DILATION AND EVACUATION N/A 07/02/2018   Procedure: DILATATION AND EVACUATION;  Surgeon: Aletha Halim, MD;  Location: [facility];  Service: Gynecology;  Laterality: N/A;  . WISDOM TOOTH EXTRACTION      Family History  Problem Relation Age of Onset  . Hypertension Mother   . Heart attack Father 55  . Obesity Father   . Hypertension  Maternal Grandmother   . Diabetes Maternal Grandmother   . Lung cancer Maternal Grandfather   . Diabetes Paternal Grandmother   . Allergies Daughter     Social History   Tobacco Use  . Smoking status: Never Smoker  . Smokeless tobacco: Never Used  Vaping Use  . Vaping Use: Never used  Substance Use Topics  . Alcohol use: Not Currently    Comment: occasionally  . Drug use: No    Prior to Admission medications   Medication Sig Start Date End Date Taking? Authorizing Provider  acetaminophen (TYLENOL) 500 MG tablet Take 2 tablets (1,000 mg total) by mouth every 8 (eight) hours. Patient not taking: Reported on 09/16/2020 09/09/20   Alcus Dad, MD  albuterol (PROVENTIL) (2.5 MG/3ML) 0.083% nebulizer solution Take 3 mLs (2.5 mg total) by nebulization every 6 (six) hours as needed for wheezing or shortness of breath. 09/17/20   [name], DO  albuterol (VENTOLIN HFA) 108 (90 Base) MCG/ACT inhaler Inhale 2 puffs into the lungs every 6 (six) hours as needed for wheezing or shortness of breath. 09/15/20   [name], DO  coconut oil OIL Apply 1 application topically as needed (nipple pain). 09/09/20   Alcus Dad, MD  enoxaparin (LOVENOX) 120 MG/0.8ML injection Inject 120mg  into the skin daily 09/09/20   Alcus Dad, MD  ferrous sulfate  325 (65 FE) MG tablet Take 1 tablet (325 mg total) by mouth every other day. 09/10/20   Alcus Dad, MD  FREESTYLE LITE test strip USE TO CHECK BLOOD SUGAR UP TO 4 TIMES A DAY AS DIRECTED 08/17/20   Megan Salon, MD  ibuprofen (ADVIL) 800 MG tablet Take 1 tablet (800 mg total) by mouth every 8 (eight) hours. 09/09/20   Alcus Dad, MD  Lancets (FREESTYLE) lancets USE TO CHECK BLOOD SUGAR UP TO 4 TIMES A DAY AS DIRECTED 08/17/20   Megan Salon, MD  metFORMIN (GLUCOPHAGE) 500 MG tablet Take 1 tablet (500 mg total) by mouth 2 (two) times daily with a meal. Patient not taking: Reported on 09/16/2020 09/09/20   Alcus Dad, MD   NIFEdipine (PROCARDIA-XL/NIFEDICAL-XL) 30 MG 24 hr tablet Take 1 tablet (30 mg total) by mouth daily. Can increase to twice a day as needed for symptomatic contractions 09/16/20   Truett Mainland, DO  oxyCODONE (OXY IR/ROXICODONE) 5 MG immediate release tablet Take 1 tablet (5 mg total) by mouth every 6 (six) hours as needed for severe pain or breakthrough pain. 09/09/20   Alcus Dad, MD  predniSONE (DELTASONE) 20 MG tablet Take 2 tablets (40 mg total) by mouth daily with breakfast for 5 days. Patient not taking: Reported on 09/16/2020 09/16/20 09/21/20  [name], DO  Prenatal Vit-Fe Fumarate-FA (PRENATAL VITAMIN) 27-0.8 MG TABS Take 1 tablet by mouth daily. 02/07/20   Randa Ngo, MD    Allergies Patient has no known allergies.   REVIEW OF SYSTEMS  Negative except as noted here or in the History of Present Illness.   PHYSICAL EXAMINATION  Initial Vital Signs Blood pressure (!) 159/90, pulse 89, temperature 98.2 F (36.8 C), temperature source Oral, resp. rate 16, height 5\' 3"  (1.6 m), weight 115 kg, SpO2 99 %, currently breastfeeding.  Examination General: Well-developed, well-nourished female in no acute distress; appearance consistent with age of record HENT: normocephalic; atraumatic Eyes: pupils equal, round and reactive to light; extraocular muscles intact Neck: supple Heart: regular rate and rhythm Lungs: clear to auscultation bilaterally Abdomen: soft; nondistended; nontender; well-healing low transverse surgical incision; bowel sounds present Extremities: No deformity; full range of motion; pulses normal; no edema Neurologic: Awake, alert and oriented; motor function intact in all extremities and symmetric; no facial droop Skin: Warm and dry Psychiatric: Normal mood and affect   RESULTS  Summary of this visit's results, reviewed and interpreted by myself:   EKG Interpretation  Date/Time:    Ventricular Rate:    PR Interval:    QRS Duration:   QT  Interval:    QTC Calculation:   R Axis:     Text Interpretation:        Laboratory Studies: Results for orders placed or performed during the hospital encounter of 09/19/20 (from the past 24 hour(s))  Urinalysis, Routine w reflex microscopic Urine, Clean Catch     Status: Abnormal   Collection Time: 09/19/20  3:08 AM  Result Value Ref Range   Color, Urine STRAW (A) YELLOW   APPearance CLEAR CLEAR   Specific Gravity, Urine 1.015 1.005 - 1.030   pH 7.5 5.0 - 8.0   Glucose, UA NEGATIVE NEGATIVE mg/dL   Hgb urine dipstick LARGE (A) NEGATIVE   Bilirubin Urine NEGATIVE NEGATIVE   Ketones, ur NEGATIVE NEGATIVE mg/dL   Protein, ur NEGATIVE NEGATIVE mg/dL   Nitrite NEGATIVE NEGATIVE   Leukocytes,Ua NEGATIVE NEGATIVE  Urinalysis, Microscopic (reflex)     Status:  Abnormal   Collection Time: 09/19/20  3:08 AM  Result Value Ref Range   RBC / HPF 11-20 0 - 5 RBC/hpf   WBC, UA 0-5 0 - 5 WBC/hpf   Bacteria, UA RARE (A) NONE SEEN   Squamous Epithelial / LPF 6-10 0 - 5  CBC with Differential/Platelet     Status: Abnormal   Collection Time: 09/19/20  3:33 AM  Result Value Ref Range   WBC 6.4 4.0 - 10.5 K/uL   RBC 4.12 3.87 - 5.11 MIL/uL   Hemoglobin 12.0 12.0 - 15.0 g/dL   HCT 37.2 36.0 - 46.0 %   MCV 90.3 80.0 - 100.0 fL   MCH 29.1 26.0 - 34.0 pg   MCHC 32.3 30.0 - 36.0 g/dL   RDW 13.6 11.5 - 15.5 %   Platelets 564 (H) 150 - 400 K/uL   nRBC 0.0 0.0 - 0.2 %   Neutrophils Relative % 48 %   Neutro Abs 3.0 1.7 - 7.7 K/uL   Lymphocytes Relative 38 %   Lymphs Abs 2.4 0.7 - 4.0 K/uL   Monocytes Relative 10 %   Monocytes Absolute 0.6 0.1 - 1.0 K/uL   Eosinophils Relative 3 %   Eosinophils Absolute 0.2 0.0 - 0.5 K/uL   Basophils Relative 0 %   Basophils Absolute 0.0 0.0 - 0.1 K/uL   Immature Granulocytes 1 %   Abs Immature Granulocytes 0.03 0.00 - 0.07 K/uL  Comprehensive metabolic panel     Status: Abnormal   Collection Time: 09/19/20  3:33 AM  Result Value Ref Range   Sodium 140 135  - 145 mmol/L   Potassium 3.2 (L) 3.5 - 5.1 mmol/L   Chloride 105 98 - 111 mmol/L   CO2 23 22 - 32 mmol/L   Glucose, Bld 87 70 - 99 mg/dL   BUN 9 6 - 20 mg/dL   Creatinine, Ser 0.61 0.44 - 1.00 mg/dL   Calcium 9.0 8.9 - 10.3 mg/dL   Total Protein 7.3 6.5 - 8.1 g/dL   Albumin 3.6 3.5 - 5.0 g/dL   AST 18 15 - 41 U/L   ALT 16 0 - 44 U/L   Alkaline Phosphatase 62 38 - 126 U/L   Total Bilirubin 0.4 0.3 - 1.2 mg/dL   GFR, Estimated >60 >60 mL/min   Anion gap 12 5 - 15   Imaging Studies: No results found.  ED COURSE and MDM  Nursing notes, initial and subsequent vitals signs, including pulse oximetry, reviewed and interpreted by myself.  Vitals:   09/19/20 0256 09/19/20 0257 09/19/20 0417  BP: (!) 159/90  (!) 153/94  Pulse: 89  89  Resp: 16  18  Temp: 98.2 F (36.8 C)    TempSrc: Oral    SpO2: 99%  97%  Weight:  115 kg   Height:  5\' 3"  (1.6 m)    Medications  metoCLOPramide (REGLAN) injection 10 mg (10 mg Intravenous Patient Refused/Not Given 09/19/20 0341)  potassium chloride SA (KLOR-CON) CR tablet 40 mEq (has no administration in time range)  diphenhydrAMINE (BENADRYL) injection 25 mg (25 mg Intravenous Given 09/19/20 0334)  ketorolac (TORADOL) 15 MG/ML injection 15 mg (15 mg Intravenous Given 09/19/20 0334)  sodium chloride 0.9 % bolus 1,000 mL (1,000 mLs Intravenous New Bag/Given 09/19/20 0337)   4:14 AM BP 153/94.  No laboratory evidence to support postpartum preeclampsia.  Patient's headache somewhat improved after IV Toradol but patient declined Reglan due to previous intolerance of this medication.  She was  advised to follow-up with her PCP tomorrow regarding her continued, though improved, hypertension.   PROCEDURES  Procedures   ED DIAGNOSES     ICD-10-CM   1. Hypertension not at goal  I10   2. Hypokalemia  E87.6   3. Bad headache  R51.9        Dorian Duval, Jenny Reichmann, MD 09/19/20 [phone]

## 2020-09-21 ENCOUNTER — Other Ambulatory Visit: Payer: Self-pay

## 2020-09-21 ENCOUNTER — Ambulatory Visit (INDEPENDENT_AMBULATORY_CARE_PROVIDER_SITE_OTHER): Payer: 59

## 2020-09-21 VITALS — BP 142/98 | HR 73 | Wt 247.0 lb

## 2020-09-21 DIAGNOSIS — R399 Unspecified symptoms and signs involving the genitourinary system: Secondary | ICD-10-CM

## 2020-09-21 DIAGNOSIS — I10 Essential (primary) hypertension: Secondary | ICD-10-CM

## 2020-09-21 DIAGNOSIS — Z0130 Encounter for examination of blood pressure without abnormal findings: Secondary | ICD-10-CM

## 2020-09-21 MED ORDER — NIFEDIPINE ER OSMOTIC RELEASE 30 MG PO TB24: 30.0000 mg | ORAL_TABLET | Freq: Every day | ORAL | 2 refills | Status: DC

## 2020-09-21 MED FILL — NIFEDIPINE ER 30 MG TABLET: 30 | 15 days supply | Qty: 30 | Fill #0

## 2020-09-21 NOTE — Addendum Note (Signed)
Addended by: Valentina Lucks on: 09/21/2020 03:43 PM   Modules accepted: Orders

## 2020-09-21 NOTE — Progress Notes (Addendum)
Subjective:  Danielle Garcia is a 30 y.o. female here for BP check.   Hypertension ROS: taking medications as instructed, no medication side effects noted, no TIA's, no chest pain on exertion, no dyspnea on exertion and no swelling of ankles.    Objective:  There were no vitals taken for this visit.  Appearance alert, well appearing, and in no distress. General exam BP noted to be well controlled today in office.    Assessment:   Blood Pressure poorly controlled.   Plan:  Current treatment plan is effective, no change in therapy. Pt states she misplaced BP medication and has not taken medication today. A refill of Nipfedipine 30 mg was sent to her pharmacy. Pt advised to follow up at [facility] app or sooner if needed.   Pt also states that she is having burning with urination x 1 day. Urine culture was sent to the lab.   Patient was assessed and managed by nursing staff during this encounter. I have reviewed the chart and agree with the documentation and plan. I have also made any necessary editorial changes.  [name], CNM 09/21/2020 10:10 PM

## 2020-09-23 LAB — URINE CULTURE

## 2020-10-05 MED FILL — ENOXAPARIN SODIUM 120 MG/0.: 120 | 21 days supply | Qty: 17 | Fill #0

## 2020-10-14 ENCOUNTER — Ambulatory Visit: Payer: 59 | Admitting: Family Medicine

## 2020-10-17 ENCOUNTER — Other Ambulatory Visit (HOSPITAL_BASED_OUTPATIENT_CLINIC_OR_DEPARTMENT_OTHER): Payer: Self-pay

## 2020-10-20 ENCOUNTER — Ambulatory Visit (INDEPENDENT_AMBULATORY_CARE_PROVIDER_SITE_OTHER): Payer: 59 | Admitting: Family Medicine

## 2020-10-20 ENCOUNTER — Other Ambulatory Visit: Payer: Self-pay

## 2020-10-20 NOTE — Progress Notes (Signed)
[facility] Partum Visit Note  Danielle Garcia is a 30 y.o. G56P2012 female who presents for a postpartum visit. She is 6 weeks postpartum following a repeat cesarean section.  I have fully reviewed the prenatal and intrapartum course. The delivery was at 52 gestational weeks.  Anesthesia: epidural. Postpartum course has been uneventful. Baby is doing well. Baby is feeding by bottle - [place] Good Start. Bleeding moderate lochia. Bowel function is normal. Bladder function is normal. Patient is not sexually active. Contraception method is unsure.  Postpartum depression screening: negative score 3.    The pregnancy intention screening data noted above was reviewed. Potential methods of contraception were discussed. The patient elected to proceed with .   Edinburgh Postnatal Depression Scale - 10/20/20 0940      Edinburgh Postnatal Depression Scale:  In the Past 7 Days   I have been able to laugh and see the funny side of things. 0    I have looked forward with enjoyment to things. 0    I have blamed myself unnecessarily when things went wrong. 0    I have been anxious or worried for no good reason. 2    I have felt scared or panicky for no good reason. 0    Things have been getting on top of me. 0    I have been so unhappy that I have had difficulty sleeping. 1    I have felt sad or miserable. 0    I have been so unhappy that I have been crying. 0    The thought of harming myself has occurred to me. 0    Edinburgh Postnatal Depression Scale Total 3            The following portions of the patient's history were reviewed and updated as appropriate: allergies, current medications, past family history, past medical history, past social history, past surgical history and problem list.  Review of Systems Pertinent items are noted in HPI.  Objective:  BP 131/67   Pulse 88   Ht 5\' 3"  (1.6 m)   Wt 256 lb (116.1 kg)   BMI 45.35 kg/m    General:  alert, cooperative and no distress   Lungs: clear to auscultation bilaterally  Heart:  regular rate and rhythm, S1, S2 normal, no murmur, click, rub or gallop  Abdomen: soft, non-tender; bowel sounds normal; no masses,  no organomegaly. Incision clean, dry, intact.        Assessment:    normal postpartum exam. Pap smear not done at today's visit.   Plan:   Essential components of care per ACOG recommendations:  1.  Mood and well being: Patient with negative depression screening today. Reviewed local resources for support.  - Patient does not use tobacco.  cessation risk of relapse - hx of drug use? No     2. Infant care and feeding:  -Patient currently breastmilk feeding? No  -Social determinants of health (SDOH) reviewed in EPIC. No concerns  3. Sexuality, contraception and birth spacing - Patient does not want a pregnancy in the next year.   - Reviewed forms of contraception in tiered fashion. Patient uncertain. Thinking about BTL - wishes she had it done at time of c/section.  Will have her return in 2-4 weeks to rediscuss. - Discussed birth spacing of 18 months  4. Sleep and fatigue -Encouraged family/partner/community support of 4 hrs of uninterrupted sleep to help with mood and fatigue  5. Physical Recovery  - Discussed patients delivery  and complications  6.  Health Maintenance - Last pap smear done 02/17/2020  and was normal with negative HPV.   7. Chronic Disease BP controlled off medication. Stopped lovenox.  Continue metformin. F/u with PCP - PCP follow up  [facility] [place]

## 2020-11-15 ENCOUNTER — Institutional Professional Consult (permissible substitution): Payer: 59 | Admitting: Obstetrics & Gynecology

## 2021-01-04 ENCOUNTER — Ambulatory Visit (INDEPENDENT_AMBULATORY_CARE_PROVIDER_SITE_OTHER): Payer: 59 | Admitting: Psychology

## 2021-01-04 DIAGNOSIS — F4323 Adjustment disorder with mixed anxiety and depressed mood: Secondary | ICD-10-CM

## 2021-01-07 ENCOUNTER — Ambulatory Visit: Payer: 59 | Admitting: Family Medicine

## 2021-01-07 ENCOUNTER — Other Ambulatory Visit: Payer: Self-pay

## 2021-01-07 ENCOUNTER — Encounter: Payer: Self-pay | Admitting: Family Medicine

## 2021-01-07 ENCOUNTER — Other Ambulatory Visit (HOSPITAL_BASED_OUTPATIENT_CLINIC_OR_DEPARTMENT_OTHER): Payer: Self-pay

## 2021-01-07 VITALS — BP 112/80 | HR 57 | Temp 98.5°F | Ht 63.0 in | Wt 263.0 lb

## 2021-01-07 DIAGNOSIS — M722 Plantar fascial fibromatosis: Secondary | ICD-10-CM | POA: Insufficient documentation

## 2021-01-07 DIAGNOSIS — G43109 Migraine with aura, not intractable, without status migrainosus: Secondary | ICD-10-CM

## 2021-01-07 DIAGNOSIS — R110 Nausea: Secondary | ICD-10-CM | POA: Diagnosis not present

## 2021-01-07 LAB — POCT URINE PREGNANCY: Preg Test, Ur: NEGATIVE

## 2021-01-07 MED ORDER — METHYLPREDNISOLONE ACETATE 40 MG/ML IJ SUSP
40.0000 mg | Freq: Once | INTRAMUSCULAR | Status: AC
  Administered 2021-01-11: 40 mg via INTRA_ARTICULAR

## 2021-01-07 MED ORDER — RIZATRIPTAN BENZOATE 5 MG PO TBDP
5.0000 mg | ORAL_TABLET | ORAL | 2 refills | Status: DC | PRN
  Filled 2021-01-07: qty 10, 17d supply, fill #0

## 2021-01-07 NOTE — Patient Instructions (Signed)
Ice/cold pack over area for 10-15 min twice daily.  OK to take Tylenol 1000 mg (2 extra strength tabs) or 975 mg (3 regular strength tabs) every 6 hours as needed.  Let us know if you need anything.  

## 2021-01-07 NOTE — Progress Notes (Signed)
Musculoskeletal Exam  Patient: Danielle Garcia DOB: [birth year]  DOS: 01/07/2021  SUBJECTIVE:  Chief Complaint:   Chief Complaint  Patient presents with   Follow-up    Lillybeth Tal is a 30 y.o.  female for evaluation and treatment of foot pain.   Onset:  several  yrs  ago. No inj or change in activity.  Location: heels Character:  aching and sharp  Progression of issue:  has worsened Associated symptoms: no bruising, swelling, redness Treatment: to date has been ice, OTC NSAIDS, acetaminophen, and home exercises.   Neurovascular symptoms: no  R sided headache Facial headache a/w nausea/vomiting, photo/phonophobia for several weeks. No inj. No neck pain or aura. Has not tried anything. No neuro s/s's. +hx of migraines. Did well w Maxalt in past.   Past Medical History:  Diagnosis Date   Anxiety    Asthma    as a child   Depression    doing ok now   Diabetes mellitus without complication ([place])    Fibroid    Floaters    GERD (gastroesophageal reflux disease)    HTN (hypertension)    2014/15 no meds since then   Infection    UTI   Migraine    Pre-diabetes    Pulmonary embolus ([facility])    after first delivery    Objective: VITAL SIGNS: BP 112/80   Pulse (!) 57   Temp 98.5 F (36.9 C) (Oral)   Ht 5\' 3"  (1.6 m)   Wt 263 lb (119.3 kg)   SpO2 99%   BMI 46.59 kg/m  Constitutional: Well formed, well developed. No acute distress. Thorax & Lungs: No accessory muscle use Musculoskeletal: Feet.   Tenderness to palpation: yes, over insertion of b/l PF Deformity: no Ecchymosis: no Neurologic: Normal sensory function. No focal deficits noted. Antalgic gait. DTRs equal and symmetric.  Psychiatric: Normal mood. Age appropriate judgment and insight. Alert & oriented x 3.    Procedure note; plantar fascia injection Verbal consent obtained The area of interest was palpated and marked with an otoscope speculum It was then cleaned with alcohol x1 Free spray was then used  for topical anesthesia 20 mg of Depo-Medrol with 1 mL of 2% lidocaine without epinephrine was injected with a 30-gauge needle The area was then bandaged This process was repeated on the contralateral side. The patient tolerated the procedure well There were no immediate complications noted  Assessment:  Plantar fasciitis - Plan: PR INJECT TENDON SHEATH/LIGAMENT  Migraine with aura and without status migrainosus, not intractable - Plan: rizatriptan (MAXALT-MLT) 5 MG disintegrating tablet  Morbid obesity ([place]), Chronic  Plan: Chronic, uncontrolled. Injections today.  Refer to podiatry if no better. Stretches/exercises, heat, ice, Tylenol.  Chronic, exacerbation. NSAIDs, restart triptan.  F/u prn. The patient voiced understanding and agreement to the plan.   [place], DO 01/07/21  12:17 PM

## 2021-01-11 MED ORDER — METHYLPREDNISOLONE ACETATE 40 MG/ML IJ SUSP
20.0000 mg | Freq: Once | INTRAMUSCULAR | Status: DC
Start: 2021-01-11 — End: 2021-01-11

## 2021-01-11 NOTE — Addendum Note (Signed)
Addended by: Wynonia Musty A on: 01/11/2021 08:39 AM   Modules accepted: Orders

## 2021-02-21 ENCOUNTER — Ambulatory Visit: Payer: 59 | Admitting: Family Medicine

## 2021-02-21 ENCOUNTER — Other Ambulatory Visit: Payer: Self-pay

## 2021-02-21 ENCOUNTER — Other Ambulatory Visit (HOSPITAL_BASED_OUTPATIENT_CLINIC_OR_DEPARTMENT_OTHER): Payer: Self-pay

## 2021-02-21 ENCOUNTER — Encounter: Payer: Self-pay | Admitting: Family Medicine

## 2021-02-21 VITALS — BP 132/82 | HR 83 | Temp 98.2°F | Ht 63.0 in | Wt 258.5 lb

## 2021-02-21 DIAGNOSIS — J069 Acute upper respiratory infection, unspecified: Secondary | ICD-10-CM | POA: Diagnosis not present

## 2021-02-21 DIAGNOSIS — M722 Plantar fascial fibromatosis: Secondary | ICD-10-CM

## 2021-02-21 LAB — POCT RAPID STREP A (OFFICE): Rapid Strep A Screen: NEGATIVE

## 2021-02-21 MED ORDER — LEVOCETIRIZINE DIHYDROCHLORIDE 5 MG PO TABS
5.0000 mg | ORAL_TABLET | Freq: Every evening | ORAL | 2 refills | Status: DC
  Filled 2021-02-21: qty 30, 30d supply, fill #0

## 2021-02-21 MED ORDER — MOMETASONE FUROATE 50 MCG/ACT NA SUSP
2.0000 | Freq: Every day | NASAL | 11 refills | Status: DC
  Filled 2021-02-21: qty 17, 30d supply, fill #0

## 2021-02-21 MED ORDER — PREDNISONE 20 MG PO TABS
40.0000 mg | ORAL_TABLET | Freq: Every day | ORAL | 0 refills | Status: AC
  Filled 2021-02-21: qty 10, 5d supply, fill #0

## 2021-02-21 NOTE — Progress Notes (Signed)
Chief Complaint  Patient presents with   Sore Throat    Danielle Garcia here for URI complaints.  Duration: 3 days  Associated symptoms: sinus congestion, rhinorrhea, sore throat, and cough Denies: sinus pain, itchy watery eyes, ear fullness, ear pain, ear drainage, wheezing, shortness of breath, myalgia, and fevers, N/V/D, loss of taste/smell Treatment to date: Alka-Seltzer cold/flu Sick contacts: Yes- daughter Tested neg for covid at home.   PF starting to come back in past few days. No inj or change in activities. Has does well w injections in the past.   Past Medical History:  Diagnosis Date   Anxiety    Asthma    as a child   Depression    doing ok now   Diabetes mellitus without complication ([place])    Fibroid    Floaters    GERD (gastroesophageal reflux disease)    HTN (hypertension)    2014/15 no meds since then   Infection    UTI   Migraine    Pre-diabetes    Pulmonary embolus ([facility])    after first delivery    Objective BP 132/82   Pulse 83   Temp 98.2 F (36.8 C) (Oral)   Ht '5\' 3"'$  (1.6 m)   Wt 258 lb 8 oz (117.3 kg)   SpO2 97%   BMI 45.79 kg/m  General: Awake, alert, appears stated age HEENT: AT, [place], ears patent b/l and TM's neg, nares patent w/o discharge, pharynx pink and without exudates, MMM Neck: No masses or asymmetry Heart: RRR Lungs: CTAB, no accessory muscle use Psych: Age appropriate judgment and insight, normal mood and affect  Viral URI with cough - Plan: Novel Coronavirus, NAA ([facility]), POCT rapid strep A, mometasone (NASONEX) 50 MCG/ACT nasal spray, levocetirizine (XYZAL) 5 MG tablet  Plantar fasciitis - Plan: predniSONE (DELTASONE) 20 MG tablet  Likely viral URI from daughter. Supportive care. Continue to push fluids, practice good hand hygiene, cover mouth when coughing. F/u prn. If starting to experience fevers, shaking, or shortness of breath, seek immediate care. Coming back. 5 d pred burst may also help w URI s/s's. She has  stretches/exercises at home. Injections worked in past. Will hold off for now.  Pt voiced understanding and agreement to the plan.  [place], DO 02/21/21 10:33 AM

## 2021-02-21 NOTE — Patient Instructions (Signed)
Continue to push fluids, practice good hand hygiene, and cover your mouth if you cough.  If you start having fevers, shaking or shortness of breath, seek immediate care.  Ice/cold pack over area for 10-15 min twice daily.  OK to take Tylenol 1000 mg (2 extra strength tabs) or 975 mg (3 regular strength tabs) every 6 hours as needed.  Let us know if you need anything.

## 2021-02-22 ENCOUNTER — Other Ambulatory Visit: Payer: Self-pay | Admitting: Family Medicine

## 2021-02-22 ENCOUNTER — Other Ambulatory Visit (HOSPITAL_BASED_OUTPATIENT_CLINIC_OR_DEPARTMENT_OTHER): Payer: Self-pay

## 2021-02-22 LAB — SARS-COV-2, NAA 2 DAY TAT

## 2021-02-22 LAB — NOVEL CORONAVIRUS, NAA: SARS-CoV-2, NAA: NOT DETECTED

## 2021-02-22 MED ORDER — AZITHROMYCIN 250 MG PO TABS
ORAL_TABLET | ORAL | 0 refills | Status: DC
  Filled 2021-02-22: qty 6, 5d supply, fill #0

## 2021-02-24 ENCOUNTER — Other Ambulatory Visit: Payer: Self-pay | Admitting: Family Medicine

## 2021-02-24 ENCOUNTER — Other Ambulatory Visit (HOSPITAL_BASED_OUTPATIENT_CLINIC_OR_DEPARTMENT_OTHER): Payer: Self-pay

## 2021-02-24 MED ORDER — PROMETHAZINE-DM 6.25-15 MG/5ML PO SYRP
5.0000 mL | ORAL_SOLUTION | Freq: Four times a day (QID) | ORAL | 0 refills | Status: DC | PRN
Start: 2021-02-24 — End: 2021-05-16
  Filled 2021-02-24: qty 118, 6d supply, fill #0

## 2021-03-07 ENCOUNTER — Ambulatory Visit (INDEPENDENT_AMBULATORY_CARE_PROVIDER_SITE_OTHER): Payer: 59 | Admitting: Psychology

## 2021-03-07 DIAGNOSIS — F4323 Adjustment disorder with mixed anxiety and depressed mood: Secondary | ICD-10-CM

## 2021-03-25 ENCOUNTER — Ambulatory Visit: Payer: 59 | Admitting: Psychology

## 2021-04-07 ENCOUNTER — Ambulatory Visit (INDEPENDENT_AMBULATORY_CARE_PROVIDER_SITE_OTHER): Payer: 59 | Admitting: Psychology

## 2021-04-07 DIAGNOSIS — F4323 Adjustment disorder with mixed anxiety and depressed mood: Secondary | ICD-10-CM

## 2021-04-22 ENCOUNTER — Ambulatory Visit: Payer: 59 | Admitting: Psychology

## 2021-04-29 ENCOUNTER — Other Ambulatory Visit: Payer: Self-pay | Admitting: Family Medicine

## 2021-04-29 MED ORDER — MOLNUPIRAVIR EUA 200MG CAPSULE: 4.0000 | ORAL_CAPSULE | Freq: Two times a day (BID) | ORAL | 0 refills | Status: AC

## 2021-05-06 ENCOUNTER — Ambulatory Visit (INDEPENDENT_AMBULATORY_CARE_PROVIDER_SITE_OTHER): Payer: 59 | Admitting: Psychology

## 2021-05-06 DIAGNOSIS — F4323 Adjustment disorder with mixed anxiety and depressed mood: Secondary | ICD-10-CM | POA: Diagnosis not present

## 2021-05-16 ENCOUNTER — Other Ambulatory Visit (HOSPITAL_BASED_OUTPATIENT_CLINIC_OR_DEPARTMENT_OTHER): Payer: Self-pay

## 2021-05-16 ENCOUNTER — Encounter: Payer: Self-pay | Admitting: Family Medicine

## 2021-05-16 ENCOUNTER — Telehealth (INDEPENDENT_AMBULATORY_CARE_PROVIDER_SITE_OTHER): Payer: 59 | Admitting: Family Medicine

## 2021-05-16 MED ORDER — OZEMPIC (0.25 OR 0.5 MG/DOSE) 2 MG/1.5ML ~~LOC~~ SOPN
PEN_INJECTOR | SUBCUTANEOUS | 1 refills | Status: AC
  Filled 2021-05-16: qty 4.5, 84d supply, fill #0

## 2021-05-16 NOTE — Progress Notes (Signed)
Chief Complaint  Patient presents with   Weight Loss    Subjective: Patient is a 30 y.o. female here for weight loss. Due to COVID-19 pandemic, we are interacting via web portal for an electronic face-to-face visit. I verified patient's ID using 2 identifiers. Patient agreed to proceed with visit via this method. Patient is at work, I am at office. Patient and I are present for visit.   Wt is 260 lbs, stable around 8-9 mo. Diet could be better, trying to do well. Inflation is affecting her ability to eat well. Not exercising now, plans to go to the gym soon. Has tried phentermine in the past that did help but not the 2nd time she tried it as she had to stop before side effects set in.   Past Medical History:  Diagnosis Date   Anxiety    Asthma    as a child   Depression    doing ok now   Fibroid    Floaters    GERD (gastroesophageal reflux disease)    HTN (hypertension)    2014/15 no meds since then   Infection    UTI   Migraine    Pre-diabetes    Pulmonary embolus ([facility])    after first delivery    Objective: No conversational dyspnea Age appropriate judgment and insight Nml affect and mood  Assessment and Plan: Morbid obesity ([place]) - Plan: Semaglutide,0.25 or 0.5MG /DOS, (OZEMPIC, 0.25 OR 0.5 MG/DOSE,) 2 MG/1.5ML SOPN  Encouraged not to get pregnant. She is not planning on getting pregnant and is not breast feeding. Counseled on diet/exercise. Will send rx Ozempic, can provide samples if too expensive. Has failed phentermine.  The patient voiced understanding and agreement to the plan.  [place], DO 05/16/21  12:11 PM

## 2021-05-27 ENCOUNTER — Ambulatory Visit (INDEPENDENT_AMBULATORY_CARE_PROVIDER_SITE_OTHER): Payer: 59 | Admitting: Psychology

## 2021-05-27 DIAGNOSIS — F4323 Adjustment disorder with mixed anxiety and depressed mood: Secondary | ICD-10-CM

## 2021-06-13 ENCOUNTER — Ambulatory Visit: Payer: 59 | Admitting: Psychology

## 2021-06-27 ENCOUNTER — Ambulatory Visit: Payer: 59 | Admitting: Psychology

## 2021-06-27 NOTE — Progress Notes (Incomplete)
[facility] Counselor/Therapist Progress Note  Patient ID: Danielle Garcia, MRN: [id number],    Date: 06/27/2021  Time Spent: ***   Treatment Type: {CHL AMB THERAPY TYPES:[phone]}  Reported Symptoms: ***  Mental Status Exam: Appearance:  {PSY:22683}     Behavior: {PSY:21022743}  Motor: {PSY:22302}  Speech/Language:  {PSY:22685}  Affect: {PSY:22687}  Mood: {PSY:31886}  Thought process: {PSY:31888}  Thought content:   {PSY:256-677-2699}  Sensory/Perceptual disturbances:   {PSY:4450498928}  Orientation: {PSY:30297}  Attention: {PSY:22877}  Concentration: {PSY:7132672186}  Memory: {PSY:6692692406}  Fund of knowledge:  {PSY:7132672186}  Insight:   {PSY:7132672186}  Judgment:  {PSY:7132672186}  Impulse Control: {PSY:7132672186}   Risk Assessment: Danger to Self:  {PSY:22692} Self-injurious Behavior: {PSY:22692} Danger to Others: {PSY:22692} Duty to Warn:{PSY:311194} Physical Aggression / Violence:{PSY:21197} Access to Firearms a concern: {PSY:21197} Gang Involvement:{PSY:21197}  Subjective: ***   Interventions: {[phone]}  Diagnosis:No diagnosis found.  Plan: ***  Clint Bolder, LCSW

## 2021-07-09 IMAGING — US US MFM FETAL BPP W/O NON-STRESS
1 series · 14 of 27 positions shown · non-contrast
Comparison: none

[Series 1: us mfm fetal bpp w/o non-stress · 27 acquisitions, 14 frames shown]
[im 1/27]
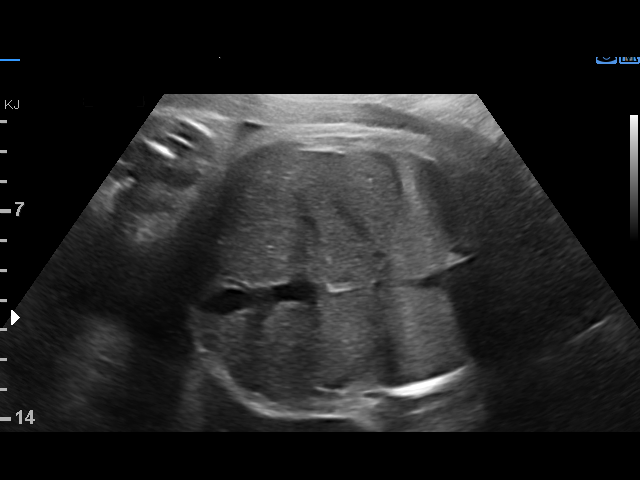
[im 3/27]
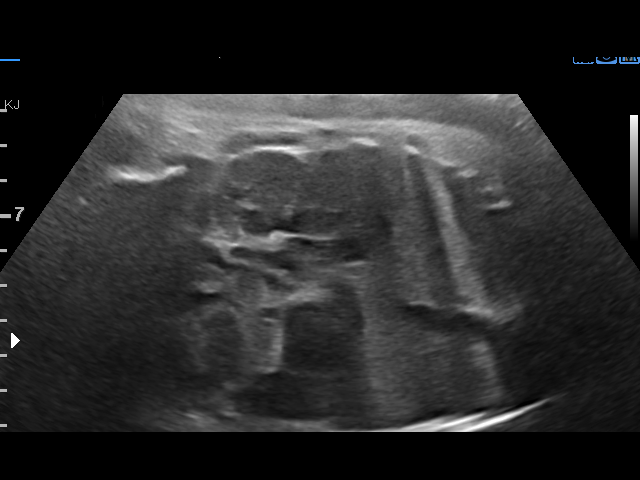
[im 5/27]
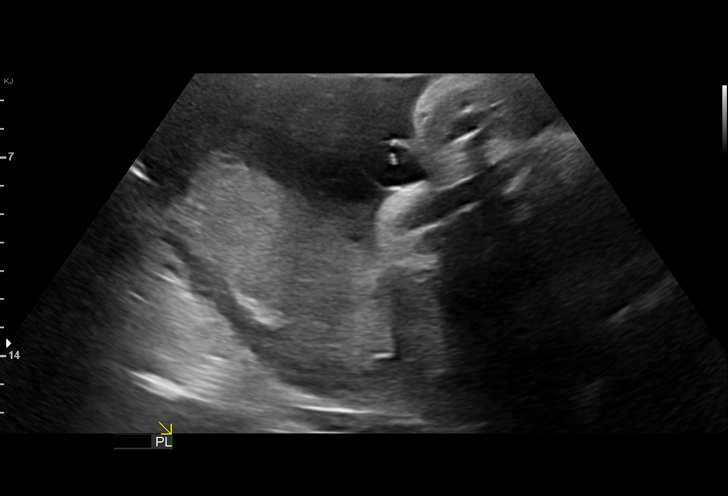
[im 7/27]
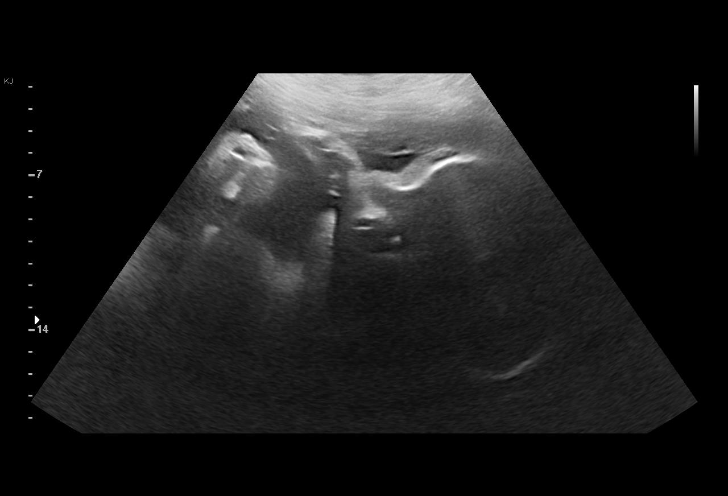
[im 9/27]
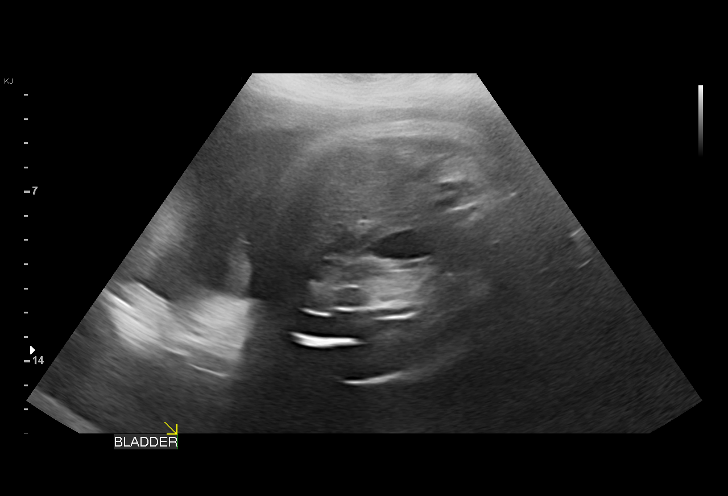
[im 11/27]
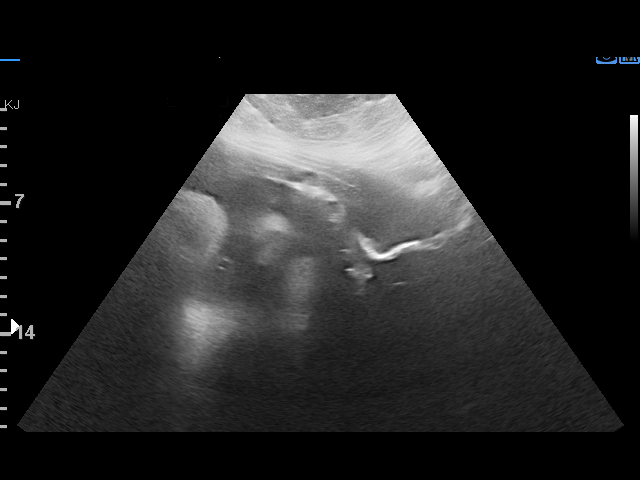
[im 13/27]
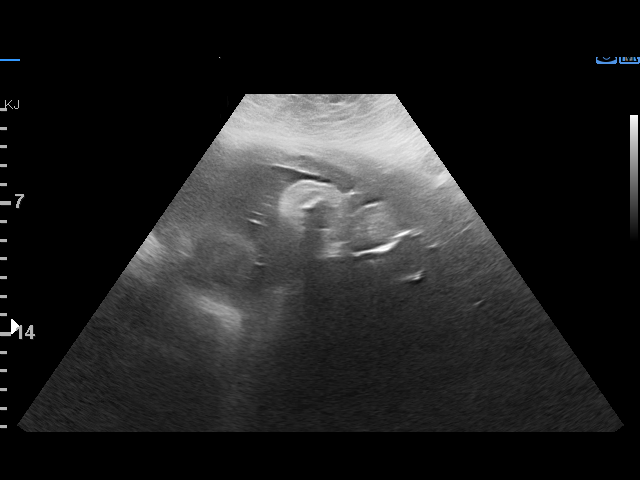
[im 15/27]
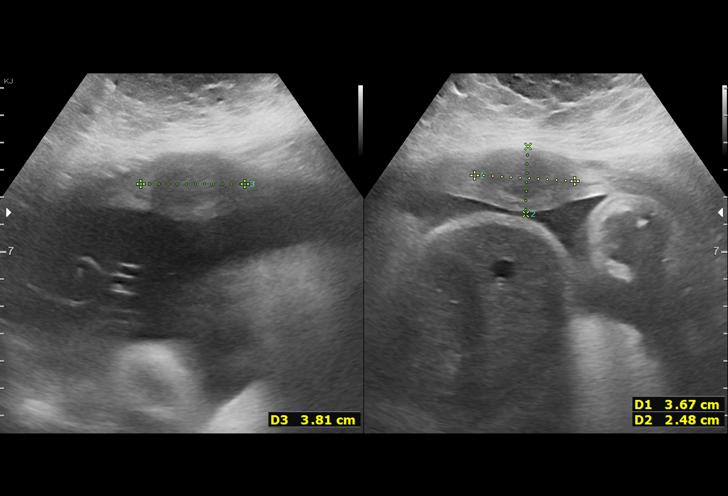
[im 17/27]
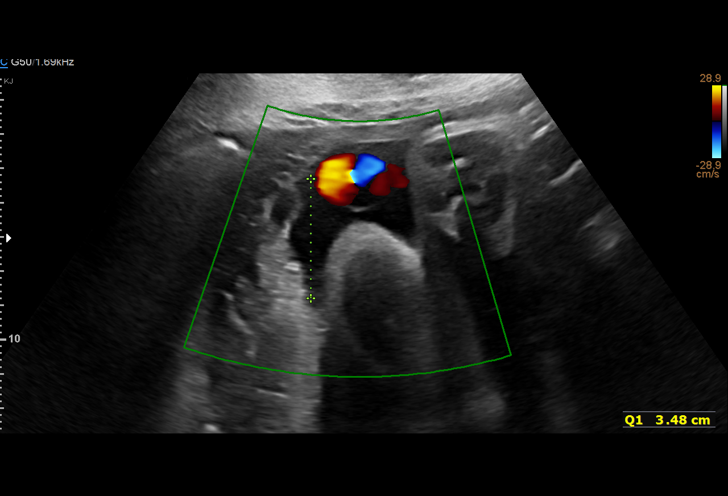
[im 19/27]
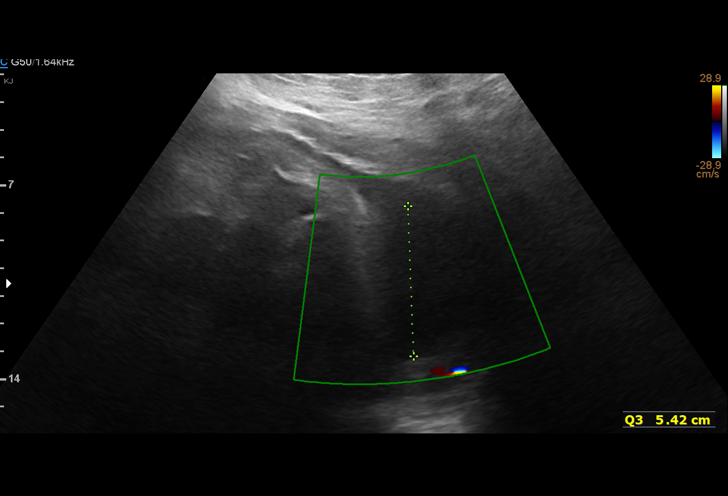
[im 21/27]
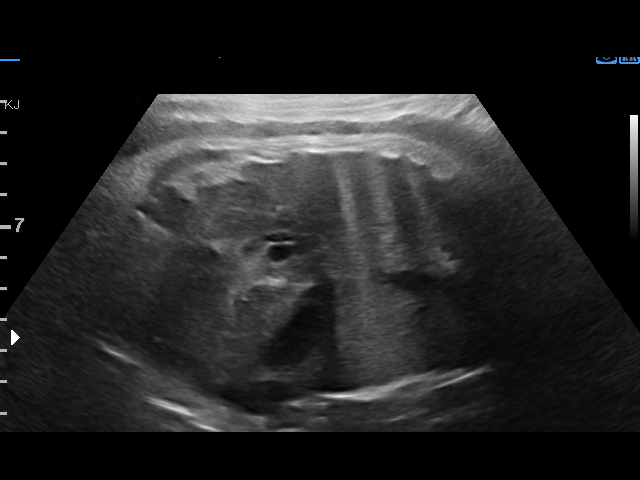
[im 23/27]
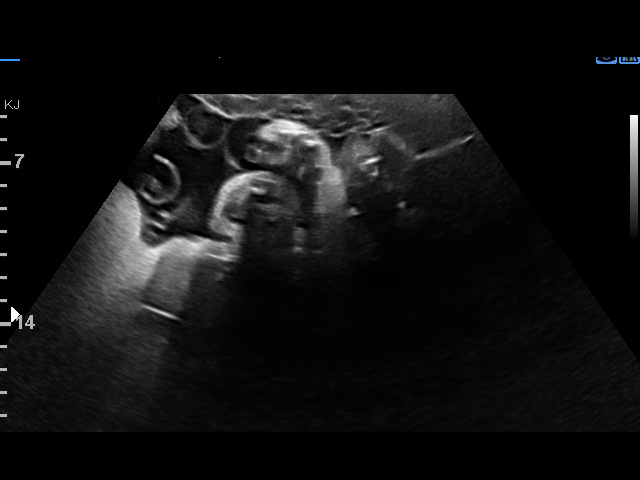
[im 25/27]
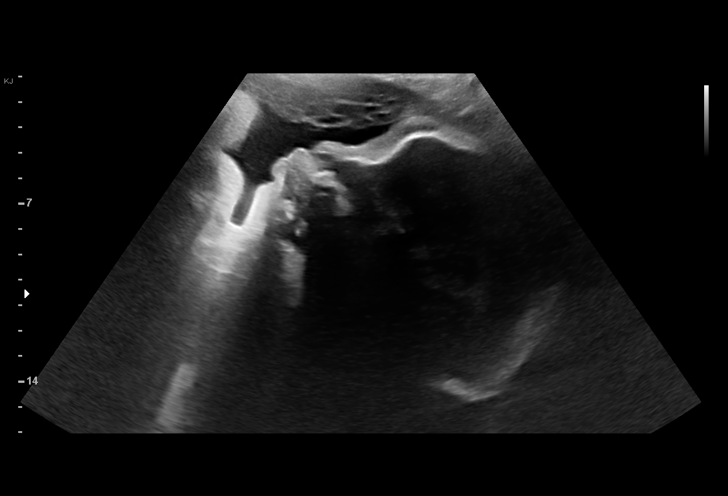
[im 27/27]
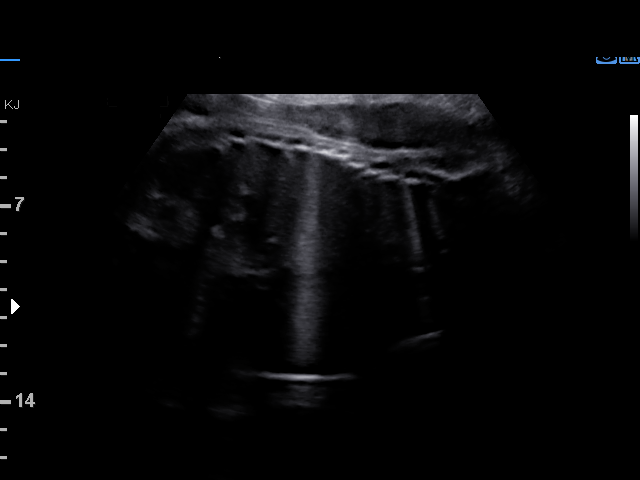

[14 of 27 positions shown; findings below may reference images not displayed]

Indications

 Gestational diabetes in pregnancy,
 controlled by oral hypoglycemic drugs
 (metformin)
 Obesity complicating pregnancy, third
 trimester (BMI 46)
 Medical complication of pregnancy (Hx
 postpartum PE; Lovenox)
 Hypertension - Chronic/Pre-existing
 Asthma                                         OUU.LU j63.929
 Anxiety during pregnancy, third trimester      O99.343,
 Uterine fibroids
 History of cesarean delivery, currently
 pregnant
 Encounter for other antenatal screening
 follow-up
 33 weeks gestation of pregnancy
Fetal Evaluation

 Num Of Fetuses:         1
 Fetal Heart Rate(bpm):  125
 Cardiac Activity:       Observed
 Presentation:           Cephalic
 Placenta:               Posterior
 P. Cord Insertion:      Previously Visualized

 Amniotic Fluid
 AFI FV:      Within normal limits

 AFI Sum(cm)     %Tile       Largest Pocket(cm)
 14.19           50
 RUQ(cm)       RLQ(cm)       LUQ(cm)        LLQ(cm)

Biophysical Evaluation

 Amniotic F.V:   Within normal limits       F. Tone:        Observed
 F. Movement:    Observed                   Score:          [DATE]
 F. Breathing:   Observed
OB History

 Blood Type:   A+
 Gravidity:    3         Term:   1         SAB:   1
Gestational Age

 LMP:           33w 6d        Date:  12/23/19                 EDD:   09/28/20
 Best:          33w 6d     Det. By:  LMP  (12/23/19)          EDD:   09/28/20
Anatomy

 Cranium:               Previously seen        Aortic Arch:            Previously seen
 Cavum:                 Previously seen        Ductal Arch:            Not well visualized
 Ventricles:            Appears normal         Diaphragm:              Appears normal
 Choroid Plexus:        Previously seen        Stomach:                Appears normal, left
                                                                       sided
 Cerebellum:            Previously seen        Abdomen:                Previously seen
 Posterior Fossa:       Previously seen        Abdominal Wall:         Previously seen
 Nuchal Fold:           Not applicable (>20    Cord Vessels:           Previously seen
                        wks GA)
 Face:                  Orbits and profile     Kidneys:                Appear normal
                        previously seen
 Lips:                  Previously seen        Bladder:                Appears normal
 Thoracic:              Previously seen        Spine:                  Limited views
                                                                       previously seen
 Heart:                 Previously seen        Upper Extremities:      Previously seen
 RVOT:                  Previously seen        Lower Extremities:      Previously seen
 LVOT:                  Previously seen

 Other:  Fetus appears to be female. Heels previously seen. Technically
         difficult due to maternal habitus and fetal position.
Myomas

 Site                     L(cm)      W(cm)      D(cm)       Location
 Anterior

 Blood Flow                  RI       PI       Comments

Comments

 This patient was seen for a biophysical profile due to
 maternal obesity, gestational diabetes treated with Metformin,
 and history of chronic hypertension.  She denies any
 problems since her last exam.
 A biophysical profile performed today was [DATE].
 There was normal amniotic fluid noted on today's ultrasound
 exam.
 Another biophysical profile was scheduled in 1 week.
 Due to her underlying medical conditions, delivery should be
 considered at between 37 to 38 weeks.  We will continue to
 see her for weekly fetal testing until delivery.

## 2021-08-04 ENCOUNTER — Encounter: Payer: Self-pay | Admitting: Family Medicine

## 2021-08-04 ENCOUNTER — Ambulatory Visit: Payer: 59 | Admitting: Family Medicine

## 2021-08-04 ENCOUNTER — Other Ambulatory Visit (HOSPITAL_BASED_OUTPATIENT_CLINIC_OR_DEPARTMENT_OTHER): Payer: Self-pay

## 2021-08-04 VITALS — BP 124/80 | HR 85 | Temp 98.2°F | Resp 18 | Ht 63.0 in | Wt 261.4 lb

## 2021-08-04 DIAGNOSIS — R059 Cough, unspecified: Secondary | ICD-10-CM | POA: Diagnosis not present

## 2021-08-04 LAB — POC COVID19 BINAXNOW: SARS Coronavirus 2 Ag: NEGATIVE

## 2021-08-04 MED ORDER — BENZONATATE 200 MG PO CAPS
200.0000 mg | ORAL_CAPSULE | Freq: Two times a day (BID) | ORAL | 0 refills | Status: DC | PRN
  Filled 2021-08-04: qty 20, 10d supply, fill #0

## 2021-08-04 NOTE — Progress Notes (Signed)
Acute Office Visit  Subjective:    Patient ID: Danielle Garcia, female    DOB: [birth year], 31 y.o.   MRN: [id number]  Chief Complaint  Patient presents with   Cough    Covid test: health at work would not accept her negative at home covid test    HPI Patient is in today for cough.   Reports she has had an occasional cough for the past week. Reports it is more like a tickle in her throat. States she does have seasonal allergies and has not yet restarted her allergy medicine. She is tired, partly from having a 3-month-old, and partly from the cough keeping her up at night. Reports cough can be productive at times, but is always clear sputum. She denies any fevers, chest pain, dyspnea, sinus pressure, body aches, chills. Doing well otherwise.   She had a negative home COVID test, but [facility] wants one from a provider's office to confirm negative.     Past Medical History:  Diagnosis Date   Anxiety    Asthma    as a child   Depression    doing ok now   Fibroid    Floaters    GERD (gastroesophageal reflux disease)    HTN (hypertension)    2014/15 no meds since then   Infection    UTI   Migraine    Pre-diabetes    Pulmonary embolus ([place])    after first delivery    Past Surgical History:  Procedure Laterality Date   CESAREAN SECTION     CESAREAN SECTION N/A 09/07/2020   Procedure: CESAREAN SECTION;  Surgeon: Mora Bellman, MD;  Location: [facility];  Service: Obstetrics;  Laterality: N/A;   DILATION AND EVACUATION N/A 07/02/2018   Procedure: DILATATION AND EVACUATION;  Surgeon: Aletha Halim, MD;  Location: [facility];  Service: Gynecology;  Laterality: N/A;   WISDOM TOOTH EXTRACTION      Family History  Problem Relation Age of Onset   Hypertension Mother    Heart attack Father 55   Obesity Father    Hypertension Maternal Grandmother    Diabetes Maternal Grandmother    Lung cancer Maternal Grandfather    Diabetes Paternal Grandmother    Allergies  Daughter     Social History   Socioeconomic History   Marital status: Married    Spouse name: Peri Jefferson   Number of children: 1   Years of education: Not on file   Highest education level: Not on file  Occupational History   Occupation: CMA    Employer: [employer]  Tobacco Use   Smoking status: Never   Smokeless tobacco: Never  Vaping Use   Vaping Use: Never used  Substance and Sexual Activity   Alcohol use: Not Currently    Comment: occasionally   Drug use: No   Sexual activity: Yes    Birth control/protection: None  Other Topics Concern   Not on file  Social History Narrative   Not on file   Social Determinants of Health   Financial Resource Strain: Not on file  Food Insecurity: Not on file  Transportation Needs: Not on file  Physical Activity: Not on file  Stress: Not on file  Social Connections: Not on file  Intimate Partner Violence: Not on file    Outpatient Medications Prior to Visit  Medication Sig Dispense Refill   levocetirizine (XYZAL) 5 MG tablet Take 1 tablet (5 mg total) by mouth every evening. 30 tablet 2   mometasone (NASONEX)  50 MCG/ACT nasal spray Place 2 sprays into the nose daily. 17 g 11   rizatriptan (MAXALT-MLT) 5 MG disintegrating tablet Take 1 tablet (5 mg total) by mouth as needed for migraine. May repeat in 2 hours if needed 10 tablet 2   Semaglutide,0.25 or 0.5MG /DOS, (OZEMPIC, 0.25 OR 0.5 MG/DOSE,) 2 MG/1.5ML SOPN Inject 0.25 mg into the skin once a week for 28 days, THEN 0.5 mg once a week for 28 days. 6 mL 1   No facility-administered medications prior to visit.    No Known Allergies  Review of Systems All review of systems negative except what is listed in the HPI     Objective:    Physical Exam Vitals reviewed.  Constitutional:      Appearance: Normal appearance.  HENT:     Mouth/Throat:     Mouth: Mucous membranes are moist.     Pharynx: Oropharynx is clear. No oropharyngeal exudate or posterior oropharyngeal  erythema.  Cardiovascular:     Rate and Rhythm: Normal rate and regular rhythm.  Pulmonary:     Effort: Pulmonary effort is normal.     Breath sounds: Normal breath sounds. No wheezing, rhonchi or rales.  Musculoskeletal:     Cervical back: Normal range of motion and neck supple.  Skin:    General: Skin is warm and dry.  Neurological:     General: No focal deficit present.     Mental Status: She is alert and oriented to person, place, and time. Mental status is at baseline.  Psychiatric:        Mood and Affect: Mood normal.        Behavior: Behavior normal.        Thought Content: Thought content normal.        Judgment: Judgment normal.      BP 124/80 (BP Location: Left Arm, Patient Position: Sitting, Cuff Size: Large)    Pulse 85    Temp 98.2 F (36.8 C) (Oral)    Resp 18    Ht 5\' 3"  (1.6 m)    Wt 261 lb 6.4 oz (118.6 kg)    SpO2 99%    BMI 46.30 kg/m  Wt Readings from Last 3 Encounters:  08/04/21 261 lb 6.4 oz (118.6 kg)  02/21/21 258 lb 8 oz (117.3 kg)  01/07/21 263 lb (119.3 kg)    Health Maintenance Due  Topic Date Due   FOOT EXAM  Never done   OPHTHALMOLOGY EXAM  Never done   URINE MICROALBUMIN  Never done   COVID-19 Vaccine (3 - Booster for Pfizer series) 06/22/2020   INFLUENZA VACCINE  02/14/2021   HEMOGLOBIN A1C  03/08/2021    There are no preventive care reminders to display for this patient.   Lab Results  Component Value Date   TSH 1.38 10/14/2019   Lab Results  Component Value Date   WBC 6.4 09/19/2020   HGB 12.0 09/19/2020   HCT 37.2 09/19/2020   MCV 90.3 09/19/2020   PLT 564 (H) 09/19/2020   Lab Results  Component Value Date   NA 140 09/19/2020   K 3.2 (L) 09/19/2020   CO2 23 09/19/2020   GLUCOSE 87 09/19/2020   BUN 9 09/19/2020   CREATININE 0.61 09/19/2020   BILITOT 0.4 09/19/2020   ALKPHOS 62 09/19/2020   AST 18 09/19/2020   ALT 16 09/19/2020   PROT 7.3 09/19/2020   ALBUMIN 3.6 09/19/2020   CALCIUM 9.0 09/19/2020   ANIONGAP 12  09/19/2020   GFR  121.19 10/14/2019   Lab Results  Component Value Date   CHOL 143 03/09/2020   Lab Results  Component Value Date   HDL 61 03/09/2020   Lab Results  Component Value Date   LDLCALC 64 03/09/2020   Lab Results  Component Value Date   TRIG 96 03/09/2020   Lab Results  Component Value Date   CHOLHDL 2.3 03/09/2020   Lab Results  Component Value Date   HGBA1C 5.8 (H) 09/08/2020       Assessment & Plan:   1. Cough, unspecified type COVID test was negative Adding Tessalon for cough Restart your Xyzal Continue supportive measures including rest, hydration, humidifier use, steam showers,  warm liquids with lemon and honey, and over-the-counter cough, cold, and analgesics as needed.   Patient aware of signs/symptoms requiring further/urgent evaluation.  - POC COVID-19 BinaxNow - benzonatate (TESSALON) 200 MG capsule; Take 1 capsule (200 mg total) by mouth 2 (two) times daily as needed for cough.  Dispense: 20 capsule; Refill: 0   Follow-up if symptoms worsen or fail to improve.  Terrilyn Saver, NP

## 2021-08-04 NOTE — Patient Instructions (Signed)
COVID test was negative Adding Tessalon for cough Restart your Xyzal Continue supportive measures including rest, hydration, humidifier use, steam showers,  warm liquids with lemon and honey, and over-the-counter cough, cold, and analgesics as needed.

## 2021-08-05 IMAGING — DX DG CHEST 2V
2 series · 2 of 2 positions shown · non-contrast
Comparison: Chest x-ray 10/07/2019.

CLINICAL DATA: 29-year-old female with history of asthma presenting
with wheezing.

EXAM:
CHEST - 2 VIEW

[chest pa]
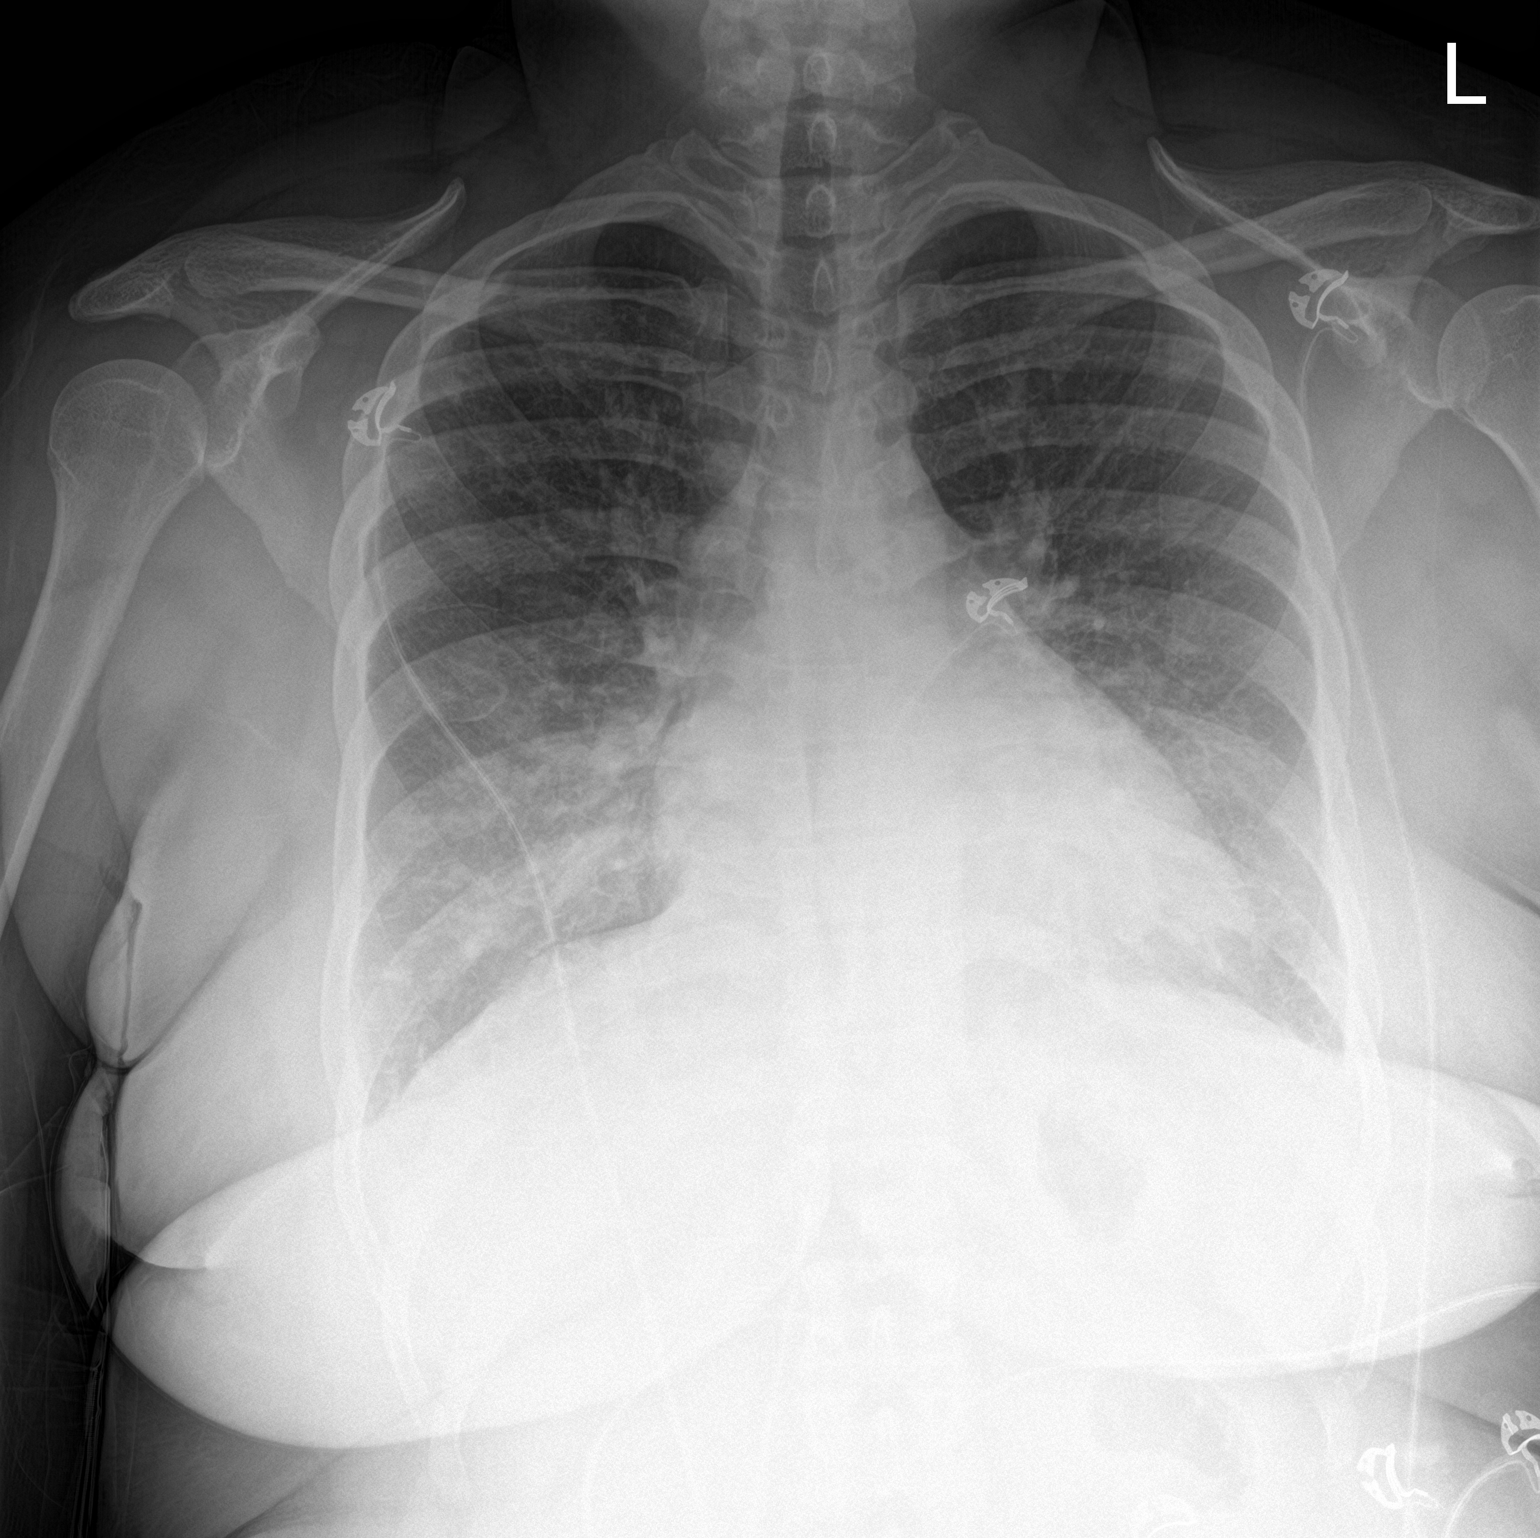

[chest lat]
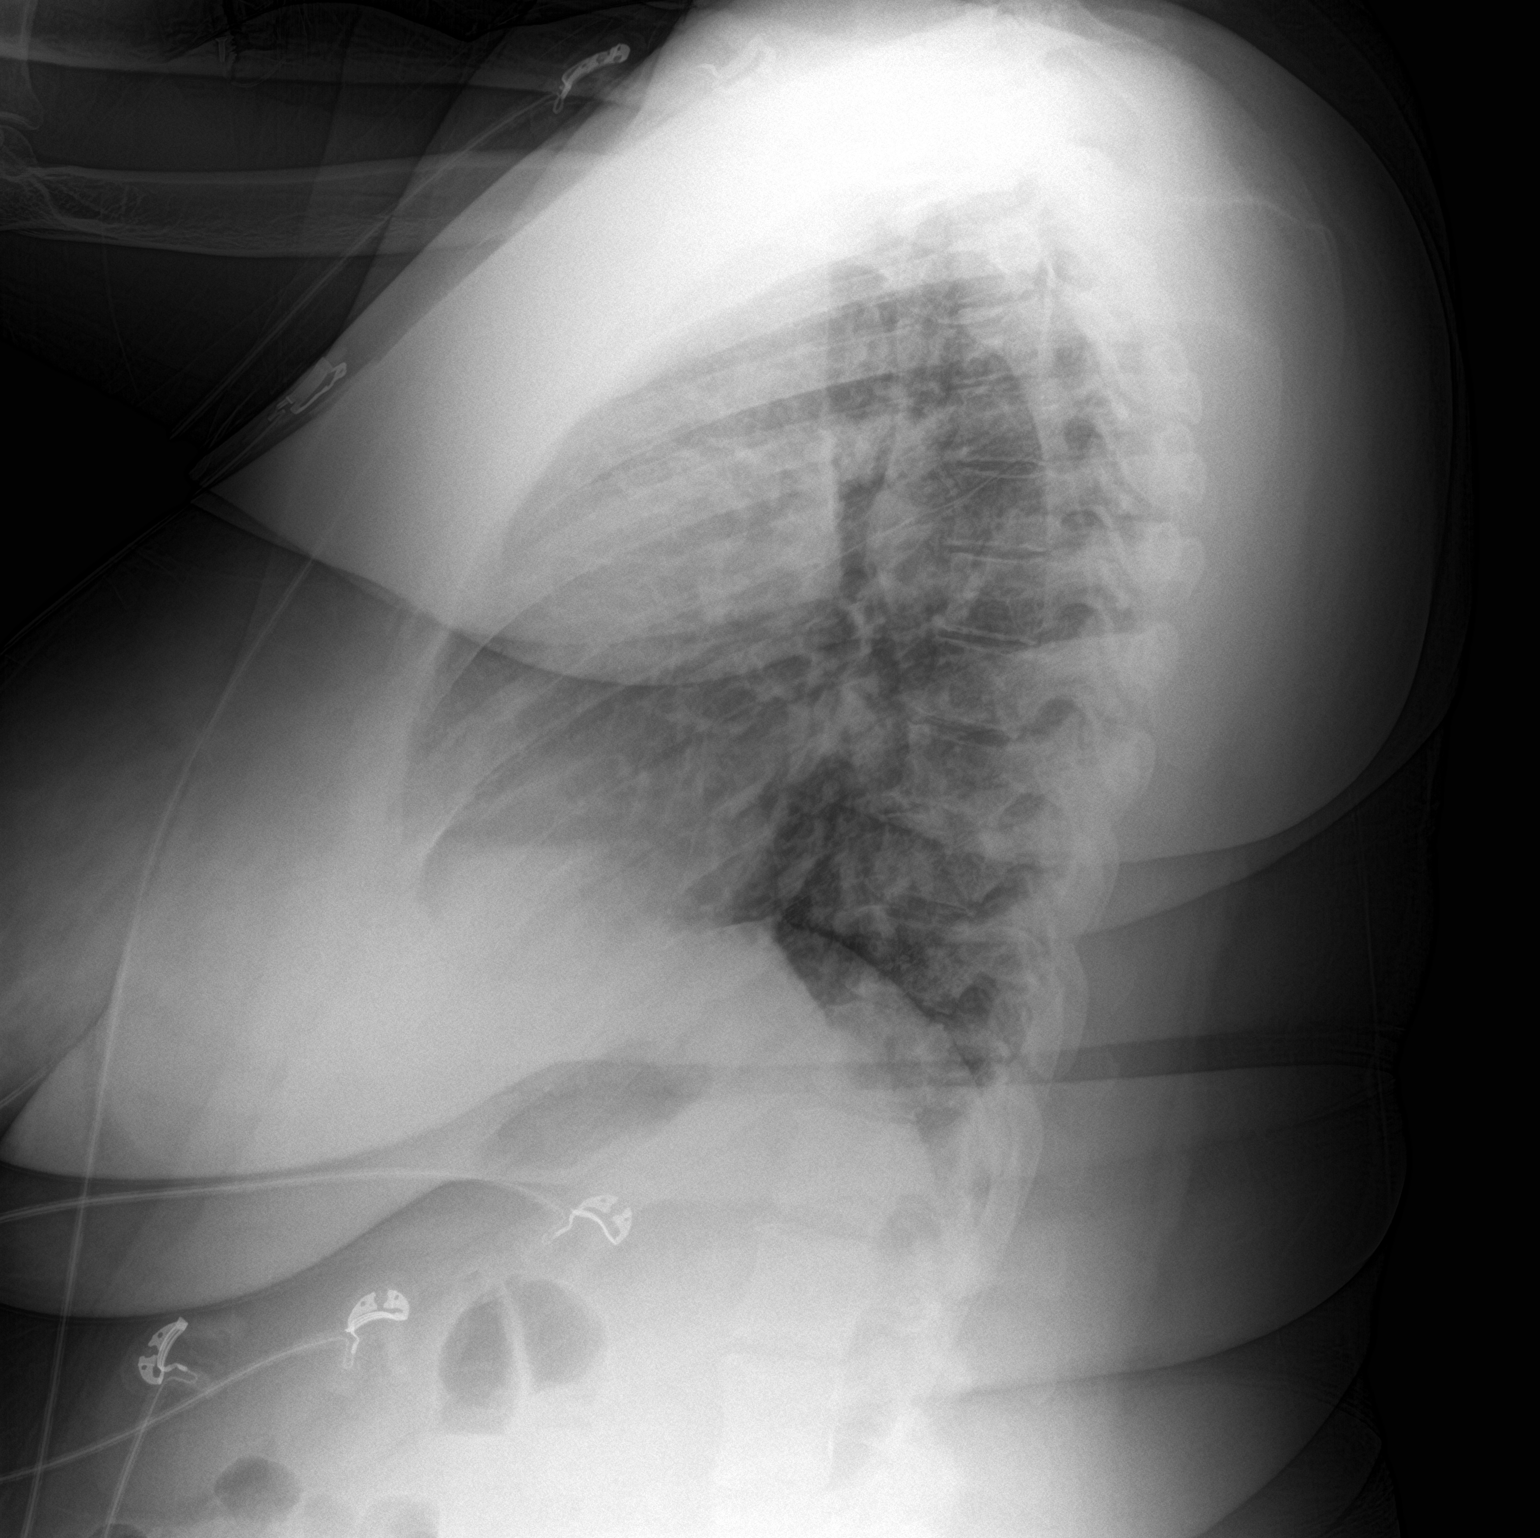

[2 of 2 positions shown; findings below may reference images not displayed]

FINDINGS: Ill-defined opacities and areas of interstitial prominence are noted
throughout the mid to lower lungs bilaterally (right greater than
left). No pleural effusions. No pneumothorax. No evidence of
pulmonary edema. No definite suspicious appearing pulmonary nodules
or masses are noted. Heart size is normal. Upper mediastinal
contours are within normal limits.
IMPRESSION: 1. Subtle findings concerning for developing multilobar bilateral
pneumonia. Clinical correlation for signs and symptoms of viral
infection is suggested.

## 2021-08-06 ENCOUNTER — Other Ambulatory Visit: Payer: Self-pay

## 2021-08-06 ENCOUNTER — Encounter (HOSPITAL_BASED_OUTPATIENT_CLINIC_OR_DEPARTMENT_OTHER): Payer: Self-pay

## 2021-08-06 ENCOUNTER — Emergency Department (HOSPITAL_BASED_OUTPATIENT_CLINIC_OR_DEPARTMENT_OTHER)
Admission: EM | Admit: 2021-08-06 | Discharge: 2021-08-06 | Disposition: A | Discharge status: Home / Self Care | Payer: 59 | Attending: Emergency Medicine | Admitting: Emergency Medicine

## 2021-08-06 ENCOUNTER — Emergency Department (HOSPITAL_BASED_OUTPATIENT_CLINIC_OR_DEPARTMENT_OTHER): Payer: 59

## 2021-08-06 DIAGNOSIS — S93401A Sprain of unspecified ligament of right ankle, initial encounter: Secondary | ICD-10-CM | POA: Diagnosis not present

## 2021-08-06 DIAGNOSIS — X500XXA Overexertion from strenuous movement or load, initial encounter: Secondary | ICD-10-CM | POA: Insufficient documentation

## 2021-08-06 DIAGNOSIS — M7989 Other specified soft tissue disorders: Secondary | ICD-10-CM | POA: Diagnosis not present

## 2021-08-06 DIAGNOSIS — S99911A Unspecified injury of right ankle, initial encounter: Secondary | ICD-10-CM | POA: Diagnosis present

## 2021-08-06 NOTE — ED Triage Notes (Signed)
Pt states she twisted her right ankle yesterday. Reports pain and swelling has worsened today.

## 2021-08-06 NOTE — ED Provider Notes (Signed)
[facility] Provider Note   CSN: [id number] Arrival date & time: 08/06/21  1050     History  Chief Complaint  Patient presents with   Ankle Pain    Danielle Garcia is a 31 y.o. female who presents to the ED today with complaint of sudden onset, constant, achy, R ankle pain s/p injury that occurred last night.  Patient reports that she was stepping off of her porch that is approximately 1 foot off the ground last night when she inverted her ankle causing her to fall to the ground.  Denies any head injury or loss of consciousness.  States that since that time she has been unable to bear much weight onto the right ankle.  She did take Tylenol last night which helped alleviate the pain/helped her sleep.  She has not taken anything for pain today.  She has no other complaints at this time.   The history is provided by the patient and medical records.      Home Medications Prior to Admission medications   Medication Sig Start Date End Date Taking? Authorizing Provider  benzonatate (TESSALON) 200 MG capsule Take 1 capsule (200 mg total) by mouth 2 (two) times daily as needed for cough. 08/04/21   Terrilyn Saver, NP  levocetirizine (XYZAL) 5 MG tablet Take 1 tablet (5 mg total) by mouth every evening. 02/21/21   [name], DO  mometasone (NASONEX) 50 MCG/ACT nasal spray Place 2 sprays into the nose daily. 02/21/21   [name], DO  rizatriptan (MAXALT-MLT) 5 MG disintegrating tablet Take 1 tablet (5 mg total) by mouth as needed for migraine. May repeat in 2 hours if needed 01/07/21   [name], DO  Semaglutide,0.25 or 0.5MG /DOS, (OZEMPIC, 0.25 OR 0.5 MG/DOSE,) 2 MG/1.5ML SOPN Inject 0.25 mg into the skin once a week for 28 days, THEN 0.5 mg once a week for 28 days. 05/16/21 08/08/21  [name], DO      Allergies    Patient has no known allergies.    Review of Systems   Review of Systems  Constitutional:   Negative for fever.  Musculoskeletal:  Positive for arthralgias and joint swelling.  Skin:  Negative for wound.  Neurological:  Negative for syncope and headaches.  All other systems reviewed and are negative.  Physical Exam Updated Vital Signs BP 136/84    Pulse 88    Temp 98 F (36.7 C)    Resp 17    Ht 5\' 3"  (1.6 m)    Wt 116.6 kg    SpO2 100%    BMI 45.53 kg/m  Physical Exam Vitals and nursing note reviewed.  Constitutional:      Appearance: She is not ill-appearing.  HENT:     Head: Normocephalic and atraumatic.  Eyes:     Conjunctiva/sclera: Conjunctivae normal.  Cardiovascular:     Rate and Rhythm: Normal rate and regular rhythm.     Pulses: Normal pulses.  Pulmonary:     Effort: Pulmonary effort is normal.     Breath sounds: Normal breath sounds. No wheezing, rhonchi or rales.  Musculoskeletal:     Comments: Mild swelling noted to R ankle, most specifically to the lateral malleolus with associated TTP. No overlying skin changes including ecchymosis. ROM slightly limited at the ankle s/2 pain. No obvious TTP to the R foot/R lower leg. Strength and sensation intact. 2+ DP Pulse. Cap refill < 2 seconds to all digits.   Skin:  General: Skin is warm and dry.     Coloration: Skin is not jaundiced.  Neurological:     Mental Status: She is alert.    ED Results / Procedures / Treatments   Labs (all labs ordered are listed, but only abnormal results are displayed) Labs Reviewed - No data to display  EKG None  Radiology DG Ankle Complete Right  Result Date: 08/06/2021 CLINICAL DATA:  Evaluate for ankle fracture. Twisted right ankle yesterday. Pain and bruising with swelling. EXAM: RIGHT ANKLE - COMPLETE 3+ VIEW COMPARISON:  None FINDINGS: Moderate lateral soft tissue swelling. No underlying acute fracture or dislocation. Small posterior and plantar calcaneal heel spurs. IMPRESSION: No acute osseous findings. Soft tissue swelling. Electronically Signed   By: Kerby Moors  M.D.   On: 08/06/2021 11:37    Procedures Procedures    Medications Ordered in ED Medications - No data to display  ED Course/ Medical Decision Making/ A&P                           Medical Decision Making 31 year old female who presents to the ED today with complaint of right ankle inversion injury that occurred last night.  Unable to bear weight since that time.  On arrival to the ED vitals are stable.  She had an x-ray done of her right ankle prior to being seen without any acute bony abnormalities.  They did note some soft tissue swelling.  On exam she is noted to have swelling along the lateral malleolus with associated tenderness palpation.  Her range of motion is slightly limited secondary to pain however she is neurovascularly intact throughout.  Do suspect ankle sprain at this time.  We will plan for Aircast and crutches.  RICE therapy and ibuprofen/Tylenol as needed for pain discussed with patient.  She is given information for sports medicine for follow-up purposes.  She is in agreement with plan and stable for discharge.  Problems Addressed: Sprain of right ankle, unspecified ligament, initial encounter: acute illness or injury    Details: Xray negative. Pt to follow up with sports medicine for further eval.  Amount and/or Complexity of Data Reviewed Radiology: ordered. Decision-making details documented in ED Course.           Final Clinical Impression(s) / ED Diagnoses Final diagnoses:  Sprain of right ankle, unspecified ligament, initial encounter    Rx / DC Orders ED Discharge Orders     None        Discharge Instructions      Your xray did not show any bony abnormalities today. Your pain is likely related to a sprain.   Please wear ankle brace and use crutches as needed.  While at home please rest, ice, and elevate your ankle to help with pain/swelling.  You can take Ibuprofen and Tylenol as needed for pain.   Follow up with sports medicine Dr.  Raeford Razor for further evaluation  Return to the ED for any new/worsening symptoms       Eustaquio Maize, PA-C 08/06/21 1156    Isla Pence, MD 08/06/21 [phone]

## 2021-08-06 NOTE — Discharge Instructions (Signed)
Your xray did not show any bony abnormalities today. Your pain is likely related to a sprain.   Please wear ankle brace and use crutches as needed.  While at home please rest, ice, and elevate your ankle to help with pain/swelling.  You can take Ibuprofen and Tylenol as needed for pain.   Follow up with sports medicine Dr. Raeford Razor for further evaluation  Return to the ED for any new/worsening symptoms

## 2021-08-09 ENCOUNTER — Ambulatory Visit: Payer: 59 | Admitting: Family Medicine

## 2021-08-10 ENCOUNTER — Other Ambulatory Visit (HOSPITAL_BASED_OUTPATIENT_CLINIC_OR_DEPARTMENT_OTHER): Payer: Self-pay

## 2021-08-10 ENCOUNTER — Ambulatory Visit: Payer: 59 | Admitting: Family Medicine

## 2021-08-10 ENCOUNTER — Encounter: Payer: Self-pay | Admitting: Family Medicine

## 2021-08-10 DIAGNOSIS — Z9109 Other allergy status, other than to drugs and biological substances: Secondary | ICD-10-CM | POA: Diagnosis not present

## 2021-08-10 DIAGNOSIS — S93491A Sprain of other ligament of right ankle, initial encounter: Secondary | ICD-10-CM | POA: Diagnosis not present

## 2021-08-10 MED ORDER — LEVOCETIRIZINE DIHYDROCHLORIDE 5 MG PO TABS
5.0000 mg | ORAL_TABLET | Freq: Every evening | ORAL | 2 refills | Status: DC
  Filled 2021-08-10: qty 30, 30d supply, fill #0

## 2021-08-10 MED ORDER — SEMAGLUTIDE (1 MG/DOSE) 4 MG/3ML ~~LOC~~ SOPN
1.0000 mg | PEN_INJECTOR | SUBCUTANEOUS | 5 refills | Status: DC
  Filled 2021-08-10: qty 3, 28d supply, fill #0
  Filled 2021-09-26: qty 3, 28d supply, fill #1
  Filled 2021-11-11: qty 3, 28d supply, fill #2
  Filled 2022-01-26: qty 3, 28d supply, fill #3

## 2021-08-10 NOTE — Patient Instructions (Addendum)
Keep the diet clean and stay active.  Strong work with the diet.   Consider taking monthly selfies.   Consider a lace up ankle brace.   Ice/cold pack over area for 10-15 min twice daily.  Heat (pad or rice pillow in microwave) over affected area, 10-15 minutes twice daily.   OK to take Tylenol 1000 mg (2 extra strength tabs) or 975 mg (3 regular strength tabs) every 6 hours as needed.  Let us know if you need anything.  Ankle Exercises It is normal to feel mild stretching, pulling, tightness, or discomfort as you do these exercises, but you should stop right away if you feel sudden pain or your pain gets worse.  Stretching and range of motion exercises These exercises warm up your muscles and joints and improve the movement and flexibility of your ankle. These exercises also help to relieve pain, numbness, and tingling. Exercise A: Dorsiflexion/Plantar Flexion    Sit with your affected knee straight or bent. Do not rest your foot on anything. Flex your affected ankle to tilt the top of your foot toward your shin. Hold this position for 5 seconds. Point your toes downward to tilt the top of your foot away from your shin. Hold this position for 5 seconds. Repeat 2 times. Complete this exercise 3 times per week. Exercise B: Ankle Alphabet    Sit with your affected foot supported at your lower leg. Do not rest your foot on anything. Make sure your foot has room to move freely. Think of your affected foot as a paintbrush, and move your foot to trace each letter of the alphabet in the air. Keep your hip and knee still while you trace. Make the letters as large as you can without increasing any discomfort. Trace every letter from A to Z. Repeat 2 times. Complete this exercise 3 times per week. Strengthening exercises These exercises build strength and endurance in your ankle. Endurance is the ability to use your muscles for a long time, even after they get tired. Exercise D:  Dorsiflexors    Secure a rubber exercise band or tube to an object, such as a table leg, that will stay still when the band is pulled. Secure the other end around your affected foot. Sit on the floor, facing the object with your affected leg extended. The band or tube should be slightly tense when your foot is relaxed. Slowly flex your affected ankle and toes to bring your foot toward you. Hold this position for 3 seconds.  Slowly return your foot to the starting position, controlling the band as you do that. Do a total of 10 repetitions. Repeat 2 times. Complete this exercise 3 times per week. Exercise E: Plantar Flexors    Sit on the floor with your affected leg extended. Loop a rubber exercise band or tube around the ball of your affected foot. The ball of your foot is on the walking surface, right under your toes. The band or tube should be slightly tense when your foot is relaxed. Slowly point your toes downward, pushing them away from you. Hold this position for 3 seconds. Slowly release the tension in the band or tube, controlling smoothly until your foot is back in the starting position. Repeat for a total of 10 repetitions. Repeat 2 times. Complete this exercise 3 times per week. Exercise F: Towel Curls    Sit in a chair on a non-carpeted surface, and put your feet on the floor. Place a towel in front  of your feet.  Keeping your heel on the floor, put your affected foot on the towel. Pull the towel toward you by grabbing the towel with your toes and curling them under. Keep your heel on the floor. Let your toes relax. Grab the towel again. Keep going until the towel is completely underneath your foot. Repeat for a total of 10 repetitions. Repeat 2 times. Complete this exercise 3 times per week. Exercise G: Heel Raise ( Plantar Flexors, Standing)     Stand with your feet shoulder-width apart. Keep your weight spread evenly over the width of your feet while you rise up on your  toes. Use a wall or table to steady yourself, but try not to use it for support. If this exercise is too easy, try these options: Shift your weight toward your affected leg until you feel challenged. If told by your health care provider, lift your uninjured leg off the floor. Hold this position for 3 seconds. Repeat for a total of 10 repetitions. Repeat 2 times. Complete this exercise 3 times per week. Exercise H: Tandem Walking Stand with one foot directly in front of the other. Slowly raise your back foot up, lifting your heel before your toes, and place it directly in front of your other foot. Continue to walk in this heel-to-toe way for 10 steps or for as long as told by your health care provider. Have a countertop or wall nearby to use if needed to keep your balance, but try not to hold onto anything for support. Repeat 2 times. Complete this exercises 3 times per week. Make sure you discuss any questions you have with your health care provider. Document Released: 05/17/2005 Document Revised: 03/02/2016 Document Reviewed: 03/21/2015 Elsevier Interactive Patient Education  2018 [employer].

## 2021-08-10 NOTE — Progress Notes (Signed)
Chief Complaint  Patient presents with   Follow-up    Subjective: Patient is a 31 y.o. female here for f/u.  Patient with history of morbid obesity.  She has been taking Ozempic 0.5 mg weekly.  She reports compliance overall and no adverse effects.  She has lost around 10 pounds since starting this.  Her diet is improving.  She is not exercising routinely.  She is interested in going on a higher dosage.  5 days ago, the patient turned her ankle inwards/inverted right ankle.  She has swelling and difficulty walking.  She went to the emergency department and x-rays were unremarkable.  She continues to have pain.  She was not given any stretches or exercises.  No neurologic signs or symptoms, bruising, redness, or fevers.  Past Medical History:  Diagnosis Date   Anxiety    Asthma    as a child   Depression    doing ok now   Fibroid    Floaters    GERD (gastroesophageal reflux disease)    HTN (hypertension)    2014/15 no meds since then   Infection    UTI   Migraine    Pre-diabetes    Pulmonary embolus (HCC)    after first delivery    Objective: BP 120/82    Pulse 71    Temp 98 F (36.7 C) (Oral)    Ht 5\' 3"  (1.6 m)    Wt 255 lb (115.7 kg)    SpO2 99%    BMI 45.17 kg/m  General: Awake, appears stated age Heart: RRR Lungs: CTAB, no rales, wheezes or rhonchi. No accessory muscle use MSK: + Lateral ankle edema on the right with tenderness to palpation over the distal peroneus musculature and lateral ligaments; no TTP over the proximal fibular head, negative squeeze and anterior drawer of the ankle Neuro: Gait is antalgic Psych: Age appropriate judgment and insight, normal affect and mood  Assessment and Plan: Morbid obesity ([place]) - Plan: Semaglutide, 1 MG/DOSE, 4 MG/3ML SOPN  Environmental allergies - Plan: levocetirizine (XYZAL) 5 MG tablet  High ankle sprain of right lower extremity, initial encounter  Chronic, not controlled.  Ozempic from 0.5 mg weekly to 1 mg weekly.   Counseled on diet and exercise. Continue levocetirizine 5 mg daily. Heat, ice, Tylenol, ibuprofen, stretches and exercises.  Consider physical therapy if no better.  Consider lace up ankle brace. Follow-up in 5 months for physical or as needed. The patient voiced understanding and agreement to the plan.  [place], DO 08/10/21  12:47 PM

## 2021-08-17 ENCOUNTER — Ambulatory Visit: Payer: 59 | Admitting: Family Medicine

## 2021-08-26 ENCOUNTER — Other Ambulatory Visit: Payer: Self-pay | Admitting: Family Medicine

## 2021-08-26 DIAGNOSIS — J0100 Acute maxillary sinusitis, unspecified: Secondary | ICD-10-CM

## 2021-08-26 MED ORDER — FLUCONAZOLE 150 MG PO TABS: 150.0000 mg | ORAL_TABLET | Freq: Once | ORAL | 0 refills | Status: DC

## 2021-09-26 ENCOUNTER — Other Ambulatory Visit (HOSPITAL_BASED_OUTPATIENT_CLINIC_OR_DEPARTMENT_OTHER): Payer: Self-pay

## 2021-10-11 ENCOUNTER — Encounter: Payer: Self-pay | Admitting: Family Medicine

## 2021-10-11 ENCOUNTER — Ambulatory Visit: Payer: 59 | Admitting: Family Medicine

## 2021-10-11 ENCOUNTER — Other Ambulatory Visit (HOSPITAL_BASED_OUTPATIENT_CLINIC_OR_DEPARTMENT_OTHER): Payer: Self-pay

## 2021-10-11 VITALS — BP 122/82 | HR 87 | Temp 98.1°F | Ht 63.0 in | Wt 253.0 lb

## 2021-10-11 DIAGNOSIS — M545 Low back pain, unspecified: Secondary | ICD-10-CM

## 2021-10-11 MED ORDER — MELOXICAM 15 MG PO TABS
15.0000 mg | ORAL_TABLET | Freq: Every day | ORAL | 0 refills | Status: DC
Start: 2021-10-12 — End: 2021-11-23
  Filled 2021-10-11: qty 30, 30d supply, fill #0

## 2021-10-11 MED ORDER — KETOROLAC TROMETHAMINE 60 MG/2ML IM SOLN
60.0000 mg | Freq: Once | INTRAMUSCULAR | Status: AC
  Administered 2021-10-11: 60 mg via INTRAMUSCULAR

## 2021-10-11 MED ORDER — TIZANIDINE HCL 4 MG PO TABS
4.0000 mg | ORAL_TABLET | Freq: Four times a day (QID) | ORAL | 0 refills | Status: DC | PRN
  Filled 2021-10-11: qty 30, 8d supply, fill #0

## 2021-10-11 NOTE — Patient Instructions (Signed)
Heat (pad or rice pillow in microwave) over affected area, 10-15 minutes twice daily.  ? ?Ice/cold pack over area for 10-15 min twice daily. ? ?OK to take Tylenol 1000 mg (2 extra strength tabs) or 975 mg (3 regular strength tabs) every 6 hours as needed. ? ?Let us know if you need anything. ? ?EXERCISES  ?RANGE OF MOTION (ROM) AND STRETCHING EXERCISES - Low Back Pain ?Most people with lower back pain will find that their symptoms get worse with excessive bending forward (flexion) or arching at the lower back (extension). The exercises that will help resolve your symptoms will focus on the opposite motion.  ?If you have pain, numbness or tingling which travels down into your buttocks, leg or foot, the goal of the therapy is for these symptoms to move closer to your back and eventually resolve. Sometimes, these leg symptoms will get better, but your lower back pain may worsen. This is often an indication of progress in your rehabilitation. Be very alert to any changes in your symptoms and the activities in which you participated in the 24 hours prior to the change. Sharing this information with your caregiver will allow him or her to most efficiently treat your condition. ?These exercises may help you when beginning to rehabilitate your injury. Your symptoms may resolve with or without further involvement from your physician, physical therapist or athletic trainer. While completing these exercises, remember:  ?Restoring tissue flexibility helps normal motion to return to the joints. This allows healthier, less painful movement and activity. ?An effective stretch should be held for at least 30 seconds. ?A stretch should never be painful. You should only feel a gentle lengthening or release in the stretched tissue. ?FLEXION RANGE OF MOTION AND STRETCHING EXERCISES: ? ?STRETCH - Flexion, Single Knee to Chest  ?Lie on a firm bed or floor with both legs extended in front of you. ?Keeping one leg in contact with the floor,  bring your opposite knee to your chest. Hold your leg in place by either grabbing behind your thigh or at your knee. ?Pull until you feel a gentle stretch in your low back. Hold 30 seconds. ?Slowly release your grasp and repeat the exercise with the opposite side. ?Repeat 2 times. Complete this exercise 3 times per week.  ? ?STRETCH - Flexion, Double Knee to Chest ?Lie on a firm bed or floor with both legs extended in front of you. ?Keeping one leg in contact with the floor, bring your opposite knee to your chest. ?Tense your stomach muscles to support your back and then lift your other knee to your chest. Hold your legs in place by either grabbing behind your thighs or at your knees. ?Pull both knees toward your chest until you feel a gentle stretch in your low back. Hold 30 seconds. ?Tense your stomach muscles and slowly return one leg at a time to the floor. ?Repeat 2 times. Complete this exercise 3 times per week.  ? ?STRETCH - Low Trunk Rotation ?Lie on a firm bed or floor. Keeping your legs in front of you, bend your knees so they are both pointed toward the ceiling and your feet are flat on the floor. ?Extend your arms out to the side. This will stabilize your upper body by keeping your shoulders in contact with the floor. ?Gently and slowly drop both knees together to one side until you feel a gentle stretch in your low back. Hold for 30 seconds. ?Tense your stomach muscles to support your lower back  as you bring your knees back to the starting position. Repeat the exercise to the other side. ?Repeat 2 times. Complete this exercise at least 3 times per week. ?  ?EXTENSION RANGE OF MOTION AND FLEXIBILITY EXERCISES: ? ?STRETCH - Extension, Prone on Elbows  ?Lie on your stomach on the floor, a bed will be too soft. Place your palms about shoulder width apart and at the height of your head. ?Place your elbows under your shoulders. If this is too painful, stack pillows under your chest. ?Allow your body to relax  so that your hips drop lower and make contact more completely with the floor. ?Hold this position for 30 seconds. ?Slowly return to lying flat on the floor. ?Repeat 2 times. Complete this exercise 3 times per week.  ? ?RANGE OF MOTION - Extension, Prone Press Ups ?Lie on your stomach on the floor, a bed will be too soft. Place your palms about shoulder width apart and at the height of your head. ?Keeping your back as relaxed as possible, slowly straighten your elbows while keeping your hips on the floor. You may adjust the placement of your hands to maximize your comfort. As you gain motion, your hands will come more underneath your shoulders. ?Hold this position 30 seconds. ?Slowly return to lying flat on the floor. ?Repeat 2 times. Complete this exercise 3 times per week.  ? ?RANGE OF MOTION- Quadruped, Neutral Spine  ?Assume a hands and knees position on a firm surface. Keep your hands under your shoulders and your knees under your hips. You may place padding under your knees for comfort. ?Drop your head and point your tailbone toward the ground below you. This will round out your lower back like an angry cat. Hold this position for 30 seconds. ?Slowly lift your head and release your tail bone so that your back sags into a large arch, like an old horse. ?Hold this position for 30 seconds. ?Repeat this until you feel limber in your low back. ?Now, find your "sweet spot." This will be the most comfortable position somewhere between the two previous positions. This is your neutral spine. Once you have found this position, tense your stomach muscles to support your low back. ?Hold this position for 30 seconds. ?Repeat 2 times. Complete this exercise 3 times per week.  ? ?STRENGTHENING EXERCISES - Low Back Sprain ?These exercises may help you when beginning to rehabilitate your injury. These exercises should be done near your "sweet spot." This is the neutral, low-back arch, somewhere between fully rounded and fully  arched, that is your least painful position. When performed in this safe range of motion, these exercises can be used for people who have either a flexion or extension based injury. These exercises may resolve your symptoms with or without further involvement from your physician, physical therapist or athletic trainer. While completing these exercises, remember:  ?Muscles can gain both the endurance and the strength needed for everyday activities through controlled exercises. ?Complete these exercises as instructed by your physician, physical therapist or athletic trainer. Increase the resistance and repetitions only as guided. ?You may experience muscle soreness or fatigue, but the pain or discomfort you are trying to eliminate should never worsen during these exercises. If this pain does worsen, stop and make certain you are following the directions exactly. If the pain is still present after adjustments, discontinue the exercise until you can discuss the trouble with your caregiver. ? ?STRENGTHENING - Deep Abdominals, Pelvic Tilt  ?Lie on a firm  bed or floor. Keeping your legs in front of you, bend your knees so they are both pointed toward the ceiling and your feet are flat on the floor. ?Tense your lower abdominal muscles to press your low back into the floor. This motion will rotate your pelvis so that your tail bone is scooping upwards rather than pointing at your feet or into the floor. ?With a gentle tension and even breathing, hold this position for 3 seconds. ?Repeat 2 times. Complete this exercise 3 times per week.  ? ?STRENGTHENING - Abdominals, Crunches  ?Lie on a firm bed or floor. Keeping your legs in front of you, bend your knees so they are both pointed toward the ceiling and your feet are flat on the floor. Cross your arms over your chest. ?Slightly tip your chin down without bending your neck. ?Tense your abdominals and slowly lift your trunk high enough to just clear your shoulder blades.  Lifting higher can put excessive stress on the lower back and does not further strengthen your abdominal muscles. ?Control your return to the starting position. ?Repeat 2 times. Complete this exercise 3 tim

## 2021-10-11 NOTE — Addendum Note (Signed)
Addended by: Sharon Seller B on: 10/11/2021 02:47 PM ? ? Modules accepted: Orders ? ?

## 2021-10-11 NOTE — Progress Notes (Signed)
Musculoskeletal Exam ? ?Patient: Danielle Garcia DOB: [birth year] ? ?DOS: 10/11/2021 ? ?SUBJECTIVE: ? ?Chief Complaint:  ? ?Chief Complaint  ?Patient presents with  ? Back Pain  ? ? ?Danielle Garcia is a 31 y.o.  female for evaluation and treatment of back pain.  ? ?Onset:  1 week ago. No inj or change in activity.  ?Location: lower ?Character:  dull  ?Progression of issue:  is moderately worse ?Associated symptoms: radiates to grown ?Denies bowel/bladder incontinence or weakness ?Treatment: to date has been OTC NSAIDS, acetaminophen, and heat.   ?Neurovascular symptoms: no ? ?Past Medical History:  ?Diagnosis Date  ? Anxiety   ? Asthma   ? as a child  ? Depression   ? doing ok now  ? Fibroid   ? Floaters   ? GERD (gastroesophageal reflux disease)   ? HTN (hypertension)   ? 2014/15 no meds since then  ? Infection   ? UTI  ? Migraine   ? Pre-diabetes   ? Pulmonary embolus ([place])   ? after first delivery  ? ? ?Objective: ? ?VITAL SIGNS: BP 122/82   Pulse 87   Temp 98.1 ?F (36.7 ?C) (Oral)   Ht '5\' 3"'$  (1.6 m)   Wt 253 lb (114.8 kg)   SpO2 97%   BMI 44.82 kg/m?  ?Constitutional: Well formed, well developed. No acute distress. ?HENT: Normocephalic, atraumatic.  ?Thorax & Lungs:  No accessory muscle use ?Musculoskeletal: low back.   ?Tenderness to palpation: yes over b/l lumbar parasp msc at L3-4 ?Deformity: no ?Ecchymosis: no ?Straight leg test: negative for ?Poor hamstring flexibility b/l. ?Neurologic: Normal sensory function. No focal deficits noted. DTR's equal and symmetric in LE's. No clonus. Gait nml. 5/5 strength b/l in LE's ?Psychiatric: Normal mood. Age appropriate judgment and insight. Alert & oriented x 3.   ? ?Assessment: ? ?Acute bilateral low back pain without sciatica - Plan: meloxicam (MOBIC) 15 MG tablet, tiZANidine (ZANAFLEX) 4 MG tablet ? ?Plan: ?Stretches/exercises, heat, ice, Tylenol, NSAIDs starting tomorrow. Toradol today. PT if no better.  ?F/u prn. ?The patient voiced understanding and  agreement to the plan. ? ? ?[place], DO ?10/11/21  ?2:39 PM ? ?

## 2021-11-11 ENCOUNTER — Other Ambulatory Visit (HOSPITAL_BASED_OUTPATIENT_CLINIC_OR_DEPARTMENT_OTHER): Payer: Self-pay

## 2021-11-11 ENCOUNTER — Emergency Department (HOSPITAL_BASED_OUTPATIENT_CLINIC_OR_DEPARTMENT_OTHER)
Admission: EM | Admit: 2021-11-11 | Discharge: 2021-11-11 | Disposition: A | Discharge status: Home / Self Care | Payer: 59 | Source: Home / Self Care | Attending: Emergency Medicine | Admitting: Emergency Medicine

## 2021-11-11 ENCOUNTER — Emergency Department (HOSPITAL_BASED_OUTPATIENT_CLINIC_OR_DEPARTMENT_OTHER): Payer: 59

## 2021-11-11 ENCOUNTER — Other Ambulatory Visit: Payer: Self-pay

## 2021-11-11 ENCOUNTER — Encounter (HOSPITAL_BASED_OUTPATIENT_CLINIC_OR_DEPARTMENT_OTHER): Payer: Self-pay | Admitting: Emergency Medicine

## 2021-11-11 DIAGNOSIS — K219 Gastro-esophageal reflux disease without esophagitis: Secondary | ICD-10-CM | POA: Diagnosis present

## 2021-11-11 DIAGNOSIS — I10 Essential (primary) hypertension: Secondary | ICD-10-CM | POA: Diagnosis not present

## 2021-11-11 DIAGNOSIS — K812 Acute cholecystitis with chronic cholecystitis: Secondary | ICD-10-CM | POA: Diagnosis present

## 2021-11-11 DIAGNOSIS — R109 Unspecified abdominal pain: Secondary | ICD-10-CM | POA: Diagnosis not present

## 2021-11-11 DIAGNOSIS — Z833 Family history of diabetes mellitus: Secondary | ICD-10-CM | POA: Diagnosis not present

## 2021-11-11 DIAGNOSIS — K8050 Calculus of bile duct without cholangitis or cholecystitis without obstruction: Secondary | ICD-10-CM | POA: Diagnosis not present

## 2021-11-11 DIAGNOSIS — F419 Anxiety disorder, unspecified: Secondary | ICD-10-CM | POA: Diagnosis present

## 2021-11-11 DIAGNOSIS — K8020 Calculus of gallbladder without cholecystitis without obstruction: Secondary | ICD-10-CM | POA: Diagnosis not present

## 2021-11-11 DIAGNOSIS — Z79899 Other long term (current) drug therapy: Secondary | ICD-10-CM | POA: Diagnosis not present

## 2021-11-11 DIAGNOSIS — R7303 Prediabetes: Secondary | ICD-10-CM | POA: Diagnosis present

## 2021-11-11 DIAGNOSIS — K851 Biliary acute pancreatitis without necrosis or infection: Secondary | ICD-10-CM | POA: Diagnosis not present

## 2021-11-11 DIAGNOSIS — R1011 Right upper quadrant pain: Secondary | ICD-10-CM | POA: Diagnosis not present

## 2021-11-11 DIAGNOSIS — Z791 Long term (current) use of non-steroidal anti-inflammatories (NSAID): Secondary | ICD-10-CM | POA: Diagnosis not present

## 2021-11-11 DIAGNOSIS — R7401 Elevation of levels of liver transaminase levels: Secondary | ICD-10-CM | POA: Diagnosis not present

## 2021-11-11 DIAGNOSIS — K831 Obstruction of bile duct: Secondary | ICD-10-CM | POA: Diagnosis not present

## 2021-11-11 DIAGNOSIS — Z6841 Body Mass Index (BMI) 40.0 and over, adult: Secondary | ICD-10-CM | POA: Diagnosis not present

## 2021-11-11 DIAGNOSIS — R079 Chest pain, unspecified: Secondary | ICD-10-CM

## 2021-11-11 DIAGNOSIS — R0789 Other chest pain: Secondary | ICD-10-CM | POA: Diagnosis not present

## 2021-11-11 DIAGNOSIS — K8010 Calculus of gallbladder with chronic cholecystitis without obstruction: Secondary | ICD-10-CM | POA: Diagnosis not present

## 2021-11-11 DIAGNOSIS — Z8249 Family history of ischemic heart disease and other diseases of the circulatory system: Secondary | ICD-10-CM

## 2021-11-11 DIAGNOSIS — R1013 Epigastric pain: Secondary | ICD-10-CM | POA: Diagnosis not present

## 2021-11-11 DIAGNOSIS — R7989 Other specified abnormal findings of blood chemistry: Secondary | ICD-10-CM | POA: Diagnosis not present

## 2021-11-11 DIAGNOSIS — K760 Fatty (change of) liver, not elsewhere classified: Secondary | ICD-10-CM | POA: Diagnosis not present

## 2021-11-11 LAB — URINALYSIS, ROUTINE W REFLEX MICROSCOPIC
Glucose, UA: NEGATIVE mg/dL
Hgb urine dipstick: NEGATIVE
Ketones, ur: NEGATIVE mg/dL
Leukocytes,Ua: NEGATIVE
Nitrite: NEGATIVE
Protein, ur: 100 mg/dL — AB
Specific Gravity, Urine: 1.02 (ref 1.005–1.030)
pH: 8.5 — ABNORMAL HIGH (ref 5.0–8.0)

## 2021-11-11 LAB — CBC
HCT: 40.3 % (ref 36.0–46.0)
Hemoglobin: 13.5 g/dL (ref 12.0–15.0)
MCH: 29.9 pg (ref 26.0–34.0)
MCHC: 33.5 g/dL (ref 30.0–36.0)
MCV: 89.4 fL (ref 80.0–100.0)
Platelets: 343 10*3/uL (ref 150–400)
RBC: 4.51 MIL/uL (ref 3.87–5.11)
RDW: 12.8 % (ref 11.5–15.5)
WBC: 9.6 10*3/uL (ref 4.0–10.5)
nRBC: 0 % (ref 0.0–0.2)

## 2021-11-11 LAB — PREGNANCY, URINE: Preg Test, Ur: NEGATIVE

## 2021-11-11 LAB — BASIC METABOLIC PANEL
Anion gap: 7 (ref 5–15)
BUN: 14 mg/dL (ref 6–20)
CO2: 26 mmol/L (ref 22–32)
Calcium: 8.9 mg/dL (ref 8.9–10.3)
Chloride: 104 mmol/L (ref 98–111)
Creatinine, Ser: 0.69 mg/dL (ref 0.44–1.00)
GFR, Estimated: >60 mL/min (ref >60)
Glucose, Bld: 102 mg/dL — ABNORMAL HIGH (ref 70–99)
Potassium: 3.6 mmol/L (ref 3.5–5.1)
Sodium: 137 mmol/L (ref 135–145)

## 2021-11-11 LAB — URINALYSIS, MICROSCOPIC (REFLEX)

## 2021-11-11 LAB — TROPONIN I (HIGH SENSITIVITY): Troponin I (High Sensitivity): 2 ng/L (ref <18)

## 2021-11-11 LAB — LIPASE, BLOOD: Lipase: 46 U/L (ref 11–51)

## 2021-11-11 MED ORDER — FAMOTIDINE 40 MG/5ML PO SUSR
20.0000 mg | Freq: Every day | ORAL | Status: DC
  Filled 2021-11-11: qty 2.5

## 2021-11-11 MED ORDER — FAMOTIDINE 20 MG PO TABS: 20.0000 mg | ORAL_TABLET | Freq: Two times a day (BID) | ORAL | 0 refills | Status: DC

## 2021-11-11 MED ORDER — ONDANSETRON HCL 4 MG PO TABS: 4.0000 mg | ORAL_TABLET | Freq: Four times a day (QID) | ORAL | 0 refills | Status: DC

## 2021-11-11 MED ORDER — MAALOX MAX 400-400-40 MG/5ML PO SUSP
10.0000 mL | Freq: Four times a day (QID) | ORAL | 0 refills | Status: DC | PRN
  Filled 2021-11-11: qty 100, 3d supply, fill #0

## 2021-11-11 MED ORDER — ONDANSETRON 4 MG PO TBDP
4.0000 mg | ORAL_TABLET | Freq: Once | ORAL | Status: AC
  Administered 2021-11-11: 4 mg via ORAL
  Filled 2021-11-11: qty 1

## 2021-11-11 MED ORDER — MAALOX MAX 400-400-40 MG/5ML PO SUSP: 10.0000 mL | Freq: Four times a day (QID) | ORAL | 0 refills | Status: DC | PRN

## 2021-11-11 MED ORDER — ONDANSETRON HCL 4 MG PO TABS
4.0000 mg | ORAL_TABLET | Freq: Four times a day (QID) | ORAL | 0 refills | Status: DC
  Filled 2021-11-11: qty 12, 3d supply, fill #0

## 2021-11-11 MED ORDER — SUCRALFATE 1 G PO TABS
1.0000 g | ORAL_TABLET | Freq: Once | ORAL | Status: DC
  Filled 2021-11-11: qty 1

## 2021-11-11 MED ORDER — FAMOTIDINE 20 MG PO TABS
20.0000 mg | ORAL_TABLET | Freq: Two times a day (BID) | ORAL | 0 refills | Status: DC
  Filled 2021-11-11: qty 30, 15d supply, fill #0

## 2021-11-11 MED ORDER — FAMOTIDINE 20 MG PO TABS
20.0000 mg | ORAL_TABLET | Freq: Once | ORAL | Status: AC
Start: 2021-11-11 — End: 2021-11-11
  Administered 2021-11-11: 20 mg via ORAL
  Filled 2021-11-11: qty 1

## 2021-11-11 MED ORDER — ALUM & MAG HYDROXIDE-SIMETH 200-200-20 MG/5ML PO SUSP
30.0000 mL | Freq: Once | ORAL | Status: AC
  Administered 2021-11-11: 30 mL via ORAL
  Filled 2021-11-11: qty 30

## 2021-11-11 NOTE — ED Triage Notes (Signed)
Patient complains of chest pressure with epigastric pain 7/10 x today.  ?Ekg completed in triage.  ?

## 2021-11-11 NOTE — ED Notes (Signed)
Patient states she can not void right now . Patient states that she has not been sexually active in a year ?

## 2021-11-11 NOTE — ED Provider Notes (Signed)
?[facility] ?Provider Note ? ? ?CSN: [id number] ?Arrival date & time: 11/11/21  1307 ? ?  ? ?History ? ?Chief Complaint  ?Patient presents with  ? Chest Pain  ? Abdominal Pain  ? ? ?Danielle Garcia is a 31 y.o. female. ? ?Pt is a 39 you female for chest pain. Pt states she has pain, point to sternal chest that began this morning while at work. Denies sob or difficulty breathing. Admits to associated nausea and indigestion-belching several times during interview. States she ate a steak, egg, and cheese bagel prior symptoms. No improvement after Tums. No vomiting, abdominal pain, diarrhea, fever, or chills. ? ?Never smoker. no hyperlipidemia, HTN, DM.   ? ?The history is provided by the patient. No language interpreter was used.  ?Chest Pain ?Associated symptoms: abdominal pain   ?Associated symptoms: no back pain, no cough, no fever, no palpitations, no shortness of breath and no vomiting   ?Abdominal Pain ?Associated symptoms: chest pain   ?Associated symptoms: no chills, no cough, no dysuria, no fever, no hematuria, no shortness of breath, no sore throat and no vomiting   ? ?  ? ?Home Medications ?Prior to Admission medications   ?Medication Sig Start Date End Date Taking? Authorizing Provider  ?levocetirizine (XYZAL) 5 MG tablet Take 1 tablet (5 mg total) by mouth every evening. 08/10/21  Yes [name], DO  ?meloxicam (MOBIC) 15 MG tablet Take 1 tablet (15 mg total) by mouth daily. 10/12/21  Yes [name], DO  ?mometasone (NASONEX) 50 MCG/ACT nasal spray Place 2 sprays into the nose daily. 02/21/21  Yes [name], DO  ?rizatriptan (MAXALT-MLT) 5 MG disintegrating tablet Take 1 tablet (5 mg total) by mouth as needed for migraine. May repeat in 2 hours if needed 01/07/21  Yes Wendling, Crosby Oyster, DO  ?Semaglutide, 1 MG/DOSE, 4 MG/3ML SOPN Inject 1 mg as directed once a week. 08/10/21  Yes [name], DO  ?tiZANidine (ZANAFLEX) 4 MG  tablet Take 1 tablet (4 mg total) by mouth every 6 (six) hours as needed for muscle spasms. 10/11/21  Yes [name], DO  ?alum & mag hydroxide-simeth (MAALOX MAX) 275-170-01 MG/5ML suspension Take 10 mLs by mouth every 6 (six) hours as needed for indigestion. 7/49/44   Lianne Cure, DO  ?famotidine (PEPCID) 20 MG tablet Take 1 tablet (20 mg total) by mouth 2 (two) times daily. 9/67/59   Lianne Cure, DO  ?ondansetron (ZOFRAN) 4 MG tablet Take 1 tablet (4 mg total) by mouth every 6 (six) hours. 1/63/84   Lianne Cure, DO  ?   ? ?Allergies    ?Patient has no known allergies.   ? ?Review of Systems   ?Review of Systems  ?Constitutional:  Negative for chills and fever.  ?HENT:  Negative for ear pain and sore throat.   ?Eyes:  Negative for pain and visual disturbance.  ?Respiratory:  Negative for cough and shortness of breath.   ?Cardiovascular:  Positive for chest pain. Negative for palpitations.  ?Gastrointestinal:  Positive for abdominal pain. Negative for vomiting.  ?Genitourinary:  Negative for dysuria and hematuria.  ?Musculoskeletal:  Negative for arthralgias and back pain.  ?Skin:  Negative for color change and rash.  ?Neurological:  Negative for seizures and syncope.  ?All other systems reviewed and are negative. ? ?Physical Exam ?Updated Vital Signs ?BP 105/68   Pulse 79   Temp 98.2 ?F (36.8 ?C) (Oral)   Resp 11  Ht '5\' 3"'$  (1.6 m)   Wt 113.4 kg   SpO2 100%   BMI 44.29 kg/m?  ?Physical Exam ?Vitals and nursing note reviewed.  ?Constitutional:   ?   General: She is not in acute distress. ?   Appearance: She is well-developed.  ?HENT:  ?   Head: Normocephalic and atraumatic.  ?Eyes:  ?   Conjunctiva/sclera: Conjunctivae normal.  ?Cardiovascular:  ?   Rate and Rhythm: Normal rate and regular rhythm.  ?   Heart sounds: No murmur heard. ?Pulmonary:  ?   Effort: Pulmonary effort is normal. No respiratory distress.  ?   Breath sounds: Normal breath sounds.  ?Abdominal:  ?   Palpations: Abdomen  is soft.  ?   Tenderness: There is no abdominal tenderness.  ?Musculoskeletal:     ?   General: No swelling.  ?   Cervical back: Neck supple.  ?Skin: ?   General: Skin is warm and dry.  ?   Capillary Refill: Capillary refill takes less than 2 seconds.  ?Neurological:  ?   Mental Status: She is alert.  ?Psychiatric:     ?   Mood and Affect: Mood normal.  ? ? ?ED Results / Procedures / Treatments   ?Labs ?(all labs ordered are listed, but only abnormal results are displayed) ?Labs Reviewed  ?BASIC METABOLIC PANEL - Abnormal; Notable for the following components:  ?    Result Value  ? Glucose, Bld 102 (*)   ? All other components within normal limits  ?CBC  ?PREGNANCY, URINE  ?LIPASE, BLOOD  ?URINALYSIS, ROUTINE W REFLEX MICROSCOPIC  ?TROPONIN I (HIGH SENSITIVITY)  ? ? ?EKG ?EKG Interpretation ? ?Date/Time:  Friday November 11 2021 13:19:02 EDT ?Ventricular Rate:  80 ?PR Interval:  172 ?QRS Duration: 76 ?QT Interval:  388 ?QTC Calculation: 447 ?R Axis:   31 ?Text Interpretation: Normal sinus rhythm with sinus arrhythmia Confirmed by Campbell Stall ([id number]) on 4/85/4627 3:33:47 PM ? ?Radiology ?DG Chest 2 View ? ?Result Date: 11/11/2021 ?CLINICAL DATA:  Chest pressure with epigastric pain. EXAM: CHEST - 2 VIEW COMPARISON:  09/12/20 FINDINGS: The heart size and mediastinal contours are within normal limits. Both lungs are clear. The visualized skeletal structures are unremarkable. IMPRESSION: No active cardiopulmonary disease. Electronically Signed   By: Abigail Miyamoto M.D.   On: 11/11/2021 13:39   ? ?Procedures ?Procedures  ? ? ?Medications Ordered in ED ?Medications  ?sucralfate (CARAFATE) tablet 1 g (1 g Oral Not Given 11/11/21 1648)  ?alum & mag hydroxide-simeth (MAALOX/MYLANTA) 200-200-20 MG/5ML suspension 30 mL (30 mLs Oral Given 11/11/21 1542)  ?famotidine (PEPCID) tablet 20 mg (20 mg Oral Given 11/11/21 1547)  ?ondansetron (ZOFRAN-ODT) disintegrating tablet 4 mg (4 mg Oral Given 11/11/21 1646)  ? ? ?ED Course/ Medical Decision  Making/ A&P ?  ?                        ?Medical Decision Making ?Amount and/or Complexity of Data Reviewed ?Labs: ordered. ?Radiology: ordered. ? ?Risk ?OTC drugs. ?Prescription drug management. ? ? ? 49 you female for chest pain.  Serious etiologies of chest pain considered.  ? ? Patient is alert and oriented x3, no acute distress, afebrile, stable vital signs.  Physical exam demonstrates bilateral breath sounds with no adventitious lung sounds.  Patient belching multiple times throughout conversation.  She had a high fat, high greasy meal prior to symptom onset.  Pepcid and Maalox given for likely GERD. ? ?EKG as interpreted  by myself demonstrates normal sinus rhythm with no ST segment elevation or depression.  Stable intervals.  Troponin, ordered by triage team, within normal limits.  Stable electrolytes.  Stable chest x-ray.  Doubt cardiac etiology. No sob or difficulty breathing. Doubt pe.  ? ?Patient in no distress and overall condition improved here in the ED. Detailed discussions were had with the patient regarding current findings, and need for close f/u with PCP or on call doctor. The patient has been instructed to return immediately if the symptoms worsen in any way for re-evaluation. Patient verbalized understanding and is in agreement with current care plan. All questions answered prior to discharge. ? ? ? ? ? ? ? ?Final Clinical Impression(s) / ED Diagnoses ?Final diagnoses:  ?Gastroesophageal reflux disease without esophagitis  ?Chest pain, unspecified type  ? ? ?Rx / DC Orders ?ED Discharge Orders   ? ?      Ordered  ?  alum & mag hydroxide-simeth (MAALOX MAX) 400-400-40 MG/5ML suspension  Every 6 hours PRN,   Status:  Discontinued       ? 11/11/21 1717  ?  famotidine (PEPCID) 20 MG tablet  2 times daily,   Status:  Discontinued       ? 11/11/21 1717  ?  ondansetron (ZOFRAN) 4 MG tablet  Every 6 hours,   Status:  Discontinued       ? 11/11/21 1717  ?  alum & mag hydroxide-simeth (MAALOX MAX)  400-400-40 MG/5ML suspension  Every 6 hours PRN       ? 11/11/21 1718  ?  famotidine (PEPCID) 20 MG tablet  2 times daily       ? 11/11/21 1718  ?  ondansetron (ZOFRAN) 4 MG tablet  Every 6 hours       ? 11/11/21 17

## 2021-11-11 NOTE — ED Notes (Signed)
Patient states that she start having pain in her chest this morning then it radiated to her back States that she is nauseous. ?

## 2021-11-11 NOTE — ED Notes (Addendum)
Called lab and spoke to [place] who said she will add on urinalysis to previously collected urine. ?

## 2021-11-11 NOTE — ED Notes (Signed)
Pt vomited an emesis bag full, provider notified. ?

## 2021-11-12 ENCOUNTER — Encounter (HOSPITAL_COMMUNITY): Payer: Self-pay | Admitting: Internal Medicine

## 2021-11-12 ENCOUNTER — Emergency Department (HOSPITAL_BASED_OUTPATIENT_CLINIC_OR_DEPARTMENT_OTHER): Payer: 59

## 2021-11-12 ENCOUNTER — Inpatient Hospital Stay (HOSPITAL_COMMUNITY): Payer: 59

## 2021-11-12 ENCOUNTER — Other Ambulatory Visit: Payer: Self-pay

## 2021-11-12 ENCOUNTER — Inpatient Hospital Stay (HOSPITAL_BASED_OUTPATIENT_CLINIC_OR_DEPARTMENT_OTHER)
Admission: EM | Admit: 2021-11-12 | Discharge: 2021-11-16 | DRG: 418 | Disposition: A | Discharge status: Home / Self Care | Payer: 59 | Attending: Internal Medicine | Admitting: Internal Medicine

## 2021-11-12 DIAGNOSIS — K219 Gastro-esophageal reflux disease without esophagitis: Secondary | ICD-10-CM | POA: Diagnosis present

## 2021-11-12 DIAGNOSIS — K831 Obstruction of bile duct: Secondary | ICD-10-CM | POA: Diagnosis present

## 2021-11-12 DIAGNOSIS — I10 Essential (primary) hypertension: Secondary | ICD-10-CM | POA: Diagnosis present

## 2021-11-12 DIAGNOSIS — Z6841 Body Mass Index (BMI) 40.0 and over, adult: Secondary | ICD-10-CM | POA: Diagnosis not present

## 2021-11-12 DIAGNOSIS — K812 Acute cholecystitis with chronic cholecystitis: Secondary | ICD-10-CM | POA: Diagnosis present

## 2021-11-12 DIAGNOSIS — R7303 Prediabetes: Secondary | ICD-10-CM | POA: Diagnosis present

## 2021-11-12 DIAGNOSIS — R7989 Other specified abnormal findings of blood chemistry: Secondary | ICD-10-CM

## 2021-11-12 DIAGNOSIS — R109 Unspecified abdominal pain: Secondary | ICD-10-CM | POA: Diagnosis not present

## 2021-11-12 DIAGNOSIS — Z8249 Family history of ischemic heart disease and other diseases of the circulatory system: Secondary | ICD-10-CM | POA: Diagnosis not present

## 2021-11-12 DIAGNOSIS — Z79899 Other long term (current) drug therapy: Secondary | ICD-10-CM | POA: Diagnosis not present

## 2021-11-12 DIAGNOSIS — R17 Unspecified jaundice: Secondary | ICD-10-CM

## 2021-11-12 DIAGNOSIS — Z833 Family history of diabetes mellitus: Secondary | ICD-10-CM | POA: Diagnosis not present

## 2021-11-12 DIAGNOSIS — Z791 Long term (current) use of non-steroidal anti-inflammatories (NSAID): Secondary | ICD-10-CM | POA: Diagnosis not present

## 2021-11-12 DIAGNOSIS — K851 Biliary acute pancreatitis without necrosis or infection: Secondary | ICD-10-CM | POA: Diagnosis present

## 2021-11-12 DIAGNOSIS — F419 Anxiety disorder, unspecified: Secondary | ICD-10-CM | POA: Diagnosis present

## 2021-11-12 DIAGNOSIS — R7401 Elevation of levels of liver transaminase levels: Secondary | ICD-10-CM

## 2021-11-12 LAB — CBC WITH DIFFERENTIAL/PLATELET
Abs Immature Granulocytes: 0.02 10*3/uL (ref 0.00–0.07)
Basophils Absolute: 0 10*3/uL (ref 0.0–0.1)
Basophils Relative: 0 %
Eosinophils Absolute: 0 10*3/uL (ref 0.0–0.5)
Eosinophils Relative: 0 %
HCT: 38 % (ref 36.0–46.0)
Hemoglobin: 12.9 g/dL (ref 12.0–15.0)
Immature Granulocytes: 0 %
Lymphocytes Relative: 16 %
Lymphs Abs: 1.4 10*3/uL (ref 0.7–4.0)
MCH: 30 pg (ref 26.0–34.0)
MCHC: 33.9 g/dL (ref 30.0–36.0)
MCV: 88.4 fL (ref 80.0–100.0)
Monocytes Absolute: 0.6 10*3/uL (ref 0.1–1.0)
Monocytes Relative: 8 %
Neutro Abs: 6.3 10*3/uL (ref 1.7–7.7)
Neutrophils Relative %: 76 %
Platelets: 338 10*3/uL (ref 150–400)
RBC: 4.3 MIL/uL (ref 3.87–5.11)
RDW: 12.8 % (ref 11.5–15.5)
WBC: 8.3 10*3/uL (ref 4.0–10.5)

## 2021-11-12 LAB — TROPONIN I (HIGH SENSITIVITY): Troponin I (High Sensitivity): 3 ng/L (ref <18)

## 2021-11-12 LAB — COMPREHENSIVE METABOLIC PANEL
ALT: 116 U/L — ABNORMAL HIGH (ref 0–44)
AST: 189 U/L — ABNORMAL HIGH (ref 15–41)
Albumin: 3.8 g/dL (ref 3.5–5.0)
Alkaline Phosphatase: 71 U/L (ref 38–126)
Anion gap: 7 (ref 5–15)
BUN: 14 mg/dL (ref 6–20)
CO2: 25 mmol/L (ref 22–32)
Calcium: 9.1 mg/dL (ref 8.9–10.3)
Chloride: 107 mmol/L (ref 98–111)
Creatinine, Ser: 0.67 mg/dL (ref 0.44–1.00)
GFR, Estimated: >60 mL/min (ref >60)
Glucose, Bld: 96 mg/dL (ref 70–99)
Potassium: 3.6 mmol/L (ref 3.5–5.1)
Sodium: 139 mmol/L (ref 135–145)
Total Bilirubin: 2.9 mg/dL — ABNORMAL HIGH (ref 0.3–1.2)
Total Protein: 6.9 g/dL (ref 6.5–8.1)

## 2021-11-12 LAB — CBC
HCT: 37.7 % (ref 36.0–46.0)
Hemoglobin: 12.5 g/dL (ref 12.0–15.0)
MCH: 30 pg (ref 26.0–34.0)
MCHC: 33.2 g/dL (ref 30.0–36.0)
MCV: 90.6 fL (ref 80.0–100.0)
Platelets: 285 10*3/uL (ref 150–400)
RBC: 4.16 MIL/uL (ref 3.87–5.11)
RDW: 13 % (ref 11.5–15.5)
WBC: 5.2 10*3/uL (ref 4.0–10.5)
nRBC: 0 % (ref 0.0–0.2)

## 2021-11-12 LAB — CREATININE, SERUM
Creatinine, Ser: 0.75 mg/dL (ref 0.44–1.00)
GFR, Estimated: >60 mL/min (ref >60)

## 2021-11-12 LAB — HIV ANTIBODY (ROUTINE TESTING W REFLEX): HIV Screen 4th Generation wRfx: NONREACTIVE

## 2021-11-12 LAB — LIPASE, BLOOD: Lipase: 53 U/L — ABNORMAL HIGH (ref 11–51)

## 2021-11-12 MED ORDER — RIZATRIPTAN BENZOATE 5 MG PO TBDP: 5.0000 mg | ORAL_TABLET | ORAL | Status: DC | PRN

## 2021-11-12 MED ORDER — ENOXAPARIN SODIUM 40 MG/0.4ML IJ SOSY
40.0000 mg | PREFILLED_SYRINGE | INTRAMUSCULAR | Status: DC
  Administered 2021-11-12 – 2021-11-15 (×4): 40 mg via SUBCUTANEOUS
  Filled 2021-11-12 (×4): qty 0.4

## 2021-11-12 MED ORDER — FENTANYL CITRATE PF 50 MCG/ML IJ SOSY
100.0000 ug | PREFILLED_SYRINGE | Freq: Once | INTRAMUSCULAR | Status: AC
  Administered 2021-11-12: 100 ug via INTRAVENOUS
  Filled 2021-11-12: qty 2

## 2021-11-12 MED ORDER — FLUTICASONE PROPIONATE 50 MCG/ACT NA SUSP: 1.0000 | Freq: Every evening | NASAL | Status: DC | PRN

## 2021-11-12 MED ORDER — HYDROMORPHONE HCL 1 MG/ML IJ SOLN
1.0000 mg | INTRAMUSCULAR | Status: DC | PRN
  Administered 2021-11-12 – 2021-11-15 (×13): 1 mg via INTRAVENOUS
  Filled 2021-11-12 (×13): qty 1

## 2021-11-12 MED ORDER — ONDANSETRON HCL 4 MG/2ML IJ SOLN
4.0000 mg | Freq: Once | INTRAMUSCULAR | Status: AC
  Administered 2021-11-12: 4 mg via INTRAVENOUS
  Filled 2021-11-12: qty 2

## 2021-11-12 MED ORDER — LEVOCETIRIZINE DIHYDROCHLORIDE 5 MG PO TABS: 5.0000 mg | ORAL_TABLET | Freq: Every evening | ORAL | Status: DC | PRN

## 2021-11-12 MED ORDER — PROCHLORPERAZINE EDISYLATE 10 MG/2ML IJ SOLN
10.0000 mg | Freq: Four times a day (QID) | INTRAMUSCULAR | Status: DC | PRN
Start: 2021-11-12 — End: 2021-11-16

## 2021-11-12 MED ORDER — SUMATRIPTAN SUCCINATE 50 MG PO TABS
50.0000 mg | ORAL_TABLET | ORAL | Status: DC | PRN
  Filled 2021-11-12: qty 1

## 2021-11-12 MED ORDER — SODIUM CHLORIDE 0.9 % IV SOLN: Freq: Once | INTRAVENOUS | Status: AC

## 2021-11-12 MED ORDER — GADOBUTROL 1 MMOL/ML IV SOLN
10.0000 mL | Freq: Once | INTRAVENOUS | Status: AC | PRN
  Administered 2021-11-12: 10 mL via INTRAVENOUS

## 2021-11-12 MED ORDER — HYDROMORPHONE HCL 1 MG/ML IJ SOLN
0.5000 mg | Freq: Once | INTRAMUSCULAR | Status: AC
  Administered 2021-11-12: 0.5 mg via INTRAVENOUS
  Filled 2021-11-12: qty 1

## 2021-11-12 MED ORDER — LORATADINE 10 MG PO TABS: 10.0000 mg | ORAL_TABLET | Freq: Every evening | ORAL | Status: DC | PRN

## 2021-11-12 NOTE — Progress Notes (Signed)
Called by [facility] about patient's liver tests, abdominal pain and US findings of gallstones and bile duct dilation. MRCP was done, results reviewed and images were personally reviewed by Dr. Fuller Plan. She may have physiologic narrowing at the ampulla but no evidence for a stricture or choledocholithiasis. However would recommend IOC at time of cholecystectomy. Thanks ?

## 2021-11-12 NOTE — H&P (Signed)
?History and Physical  ? ? ?Patient: Danielle Garcia XIP:[id number] DOB: 1991-02-21 ?DOA: 11/12/2021 ?DOS: the patient was seen and examined on 11/12/2021 ?PCP: [name], DO  ?Patient coming from: Home ? ?Chief Complaint:  ?Chief Complaint  ?Patient presents with  ? Abdominal Pain  ? ?HPI: Danielle Garcia is a 31 y.o. female with medical history significant of morbid obesity. Presenting with abdominal pain. She first noticed her pain yesterday morning. It was a sharp epigastric pain that radiated to her back. She immediately went to the ED. She was given miralax, pepcid and zofran. She reported improvement in her symptoms and she was discharged home. When she got home, her pain returned with more intensity. It progressed through the evening to where she was nauseous and vomiting. When her symptoms didn't improve last night, she decided to come back to the ED for evaluation. She denies any other aggravating or alleviating factors.   ? ?Review of Systems: As mentioned in the history of present illness. All other systems reviewed and are negative. ?Past Medical History:  ?Diagnosis Date  ? Anxiety   ? Asthma   ? as a child  ? Depression   ? doing ok now  ? Fibroid   ? Floaters   ? GERD (gastroesophageal reflux disease)   ? HTN (hypertension)   ? 2014/15 no meds since then  ? Infection   ? UTI  ? Migraine   ? Pre-diabetes   ? Pulmonary embolus ([place])   ? after first delivery  ? ?Past Surgical History:  ?Procedure Laterality Date  ? CESAREAN SECTION    ? CESAREAN SECTION N/A 09/07/2020  ? Procedure: CESAREAN SECTION;  Surgeon: Mora Bellman, MD;  Location: [facility];  Service: Obstetrics;  Laterality: N/A;  ? DILATION AND EVACUATION N/A 07/02/2018  ? Procedure: DILATATION AND EVACUATION;  Surgeon: Aletha Halim, MD;  Location: [facility];  Service: Gynecology;  Laterality: N/A;  ? WISDOM TOOTH EXTRACTION    ? ?Social History:  reports that she has never smoked. She has never used smokeless tobacco. She  reports that she does not currently use alcohol. She reports that she does not use drugs. ? ?No Known Allergies ? ?Family History  ?Problem Relation Age of Onset  ? Hypertension Mother   ? Heart attack Father 11  ? Obesity Father   ? Hypertension Maternal Grandmother   ? Diabetes Maternal Grandmother   ? Lung cancer Maternal Grandfather   ? Diabetes Paternal Grandmother   ? Allergies Daughter   ? ? ?Prior to Admission medications   ?Medication Sig Start Date End Date Taking? Authorizing Provider  ?alum & mag hydroxide-simeth (MAALOX MAX) 400-400-40 MG/5ML suspension Take 10 mLs by mouth every 6 (six) hours as needed for indigestion. 6/73/41   Lianne Cure, DO  ?famotidine (PEPCID) 20 MG tablet Take 1 tablet (20 mg total) by mouth 2 (two) times daily. 9/37/90   Lianne Cure, DO  ?levocetirizine (XYZAL) 5 MG tablet Take 1 tablet (5 mg total) by mouth every evening. 08/10/21   [name], DO  ?meloxicam (MOBIC) 15 MG tablet Take 1 tablet (15 mg total) by mouth daily. 10/12/21   [name], DO  ?mometasone (NASONEX) 50 MCG/ACT nasal spray Place 2 sprays into the nose daily. 02/21/21   [name], DO  ?ondansetron (ZOFRAN) 4 MG tablet Take 1 tablet (4 mg total) by mouth every 6 (six) hours. 2/40/97   Lianne Cure, DO  ?rizatriptan (MAXALT-MLT) 5 MG disintegrating  tablet Take 1 tablet (5 mg total) by mouth as needed for migraine. May repeat in 2 hours if needed 01/07/21   [name], DO  ?Semaglutide, 1 MG/DOSE, 4 MG/3ML SOPN Inject 1 mg as directed once a week. 08/10/21   [name], DO  ?tiZANidine (ZANAFLEX) 4 MG tablet Take 1 tablet (4 mg total) by mouth every 6 (six) hours as needed for muscle spasms. 10/11/21   [name], DO  ? ? ?Physical Exam: ?Vitals:  ? 11/12/21 0700 11/12/21 0756 11/12/21 1100 11/12/21 1322  ?BP: 122/75 127/84 137/86 120/84  ?Pulse: 68 (!) 59 66 71  ?Resp: '18 18 16 18  '$ ?Temp:    98.2 ?F (36.8 ?C)  ?TempSrc:    Oral   ?SpO2: 99% 100% 100% 100%  ?Weight:      ?Height:      ? ?General: 31 y.o. female resting in bed in NAD ?Eyes: PERRL, normal sclera ?ENMT: Nares patent w/o discharge, orophaynx clear, dentition normal, ears w/o discharge/lesions/ulcers ?Neck: Supple, trachea midline ?Cardiovascular: RRR, +S1, S2, no m/g/r, equal pulses throughout ?Respiratory: CTABL, no w/r/r, normal WOB ?GI: BS+, ND, TTP RUQ/epigastric, no masses noted, no organomegaly noted ?MSK: No e/c/c ?Neuro: A&O x 3, no focal deficits ?Psyc: Appropriate interaction and affect, calm/cooperative ? ?Data Reviewed: ? ?Na+  139 ?Lipase  53 ?AST  189 ?ALT  116 ?WBC  8.3 ? ?RUQ ab [place] ?1. Gallstones and trace pericholecystic fluid. No gallbladder wall thickening, pericholecystic fluid or sonographic Murphy's sign. ?2. Increase caliber of the common bile duct measuring 8.6 mm. No intrahepatic bile duct dilatation.  ? ?Assessment and Plan: ?No notes have been filed under this hospital service. ?Service: Hospitalist ?Abdominal pain ?Elevated LFTs ?Hyperbilirubinemia ?Bilary obstruction ?    - admit to inpt, med-surg ?    - RUQ AB [place] as above ?    - MRCP ordered.  ?    - anti-emetic, pain control, fluids ? ?Morbid obesity ?    - counsel on diet/lifestyle changes; outpt follow up ? ?Advance Care Planning:   Code Status: FULL ? ?Consults: EDP spoke w/ [facility]; I consulted [facility] ? ?Family Communication: None at bedside ? ?Severity of Illness: ?The appropriate patient status for this patient is INPATIENT. Inpatient status is judged to be reasonable and necessary in order to provide the required intensity of service to ensure the patient's safety. The patient's presenting symptoms, physical exam findings, and initial radiographic and laboratory data in the context of their chronic comorbidities is felt to place them at high risk for further clinical deterioration. Furthermore, it is not anticipated that the patient will be medically stable for discharge from the hospital within 2  midnights of admission.  ? ?* I certify that at the point of admission it is my clinical judgment that the patient will require inpatient hospital care spanning beyond 2 midnights from the point of admission due to high intensity of service, high risk for further deterioration and high frequency of surveillance required.* ? ?Author: ?Jonnie Finner, DO ?11/12/2021 1:30 PM ? ?For on call review [email or web address].  ?

## 2021-11-12 NOTE — ED Notes (Signed)
Pt report given to [facility] ?

## 2021-11-12 NOTE — ED Notes (Signed)
Accepting RN Helene Kelp given report at this time. All questions and concerns addressed.   ?

## 2021-11-12 NOTE — ED Provider Notes (Signed)
Signout note ? ?31 year old lady presenting to ER due to concern for upper abdominal pain, nausea and vomiting.  Labs concerning for elevation in AST, ALT, T. bili.  At time of signout, plan to check ultrasound. ? ?Ultrasound is concerning for gallstones, trace pericholecystic fluid, mild increase in caliber of the common bile duct. ? ?Discussed case with general surgery, [place].  He advises MRCP, consult GI.  Discussed case with Dr. Michail Sermon, he agrees with MRCP, call GI back if abnormal.  Have ordered MRCP.  Consulted hospitalist for admit due to the transaminitis, possibility of obstructing bile duct stone. ?  ?Lucrezia Starch, MD ?11/12/21 1058 ? ?

## 2021-11-12 NOTE — ED Provider Notes (Signed)
?[facility] EMERGENCY DEPARTMENT ?Provider Note ? ? ?CSN: [id number] ?Arrival date & time: 11/11/21  2353 ? ?  ? ?History ? ?Chief Complaint  ?Patient presents with  ? Abdominal Pain  ? ? ?Danielle Garcia is a 31 y.o. female. ? ?The history is provided by the patient.  ?Abdominal Pain ?Pain location:  Epigastric ?Pain quality: sharp   ?Pain radiates to:  Back ?Pain severity:  Moderate ?Onset quality:  Gradual ?Timing:  Intermittent ?Progression:  Worsening ?Chronicity:  Recurrent ?Relieved by:  Nothing ?Worsened by:  Movement ?Associated symptoms: nausea   ?Associated symptoms: no chest pain, no diarrhea, no shortness of breath and no vomiting   ?Patient reports return of upper abdominal pain.  She was seen in the ER yesterday.  She felt improved with medications but after going home the pain returned after eating crackers.  The pain is mostly in the upper abdomen and goes to the back.  She reports nausea but no vomiting. ?Only previous surgery was C-section. ? ?  ? ?Home Medications ?Prior to Admission medications   ?Medication Sig Start Date End Date Taking? Authorizing Provider  ?alum & mag hydroxide-simeth (MAALOX MAX) 400-400-40 MG/5ML suspension Take 10 mLs by mouth every 6 (six) hours as needed for indigestion. 5/85/27   Lianne Cure, DO  ?famotidine (PEPCID) 20 MG tablet Take 1 tablet (20 mg total) by mouth 2 (two) times daily. 7/82/42   Lianne Cure, DO  ?levocetirizine (XYZAL) 5 MG tablet Take 1 tablet (5 mg total) by mouth every evening. 08/10/21   [name], DO  ?meloxicam (MOBIC) 15 MG tablet Take 1 tablet (15 mg total) by mouth daily. 10/12/21   [name], DO  ?mometasone (NASONEX) 50 MCG/ACT nasal spray Place 2 sprays into the nose daily. 02/21/21   [name], DO  ?ondansetron (ZOFRAN) 4 MG tablet Take 1 tablet (4 mg total) by mouth every 6 (six) hours. 3/53/61   Lianne Cure, DO  ?rizatriptan (MAXALT-MLT) 5 MG disintegrating tablet Take 1 tablet  (5 mg total) by mouth as needed for migraine. May repeat in 2 hours if needed 01/07/21   [name], DO  ?Semaglutide, 1 MG/DOSE, 4 MG/3ML SOPN Inject 1 mg as directed once a week. 08/10/21   [name], DO  ?tiZANidine (ZANAFLEX) 4 MG tablet Take 1 tablet (4 mg total) by mouth every 6 (six) hours as needed for muscle spasms. 10/11/21   [name], DO  ?   ? ?Allergies    ?Patient has no known allergies.   ? ?Review of Systems   ?Review of Systems  ?Respiratory:  Negative for shortness of breath.   ?Cardiovascular:  Negative for chest pain.  ?Gastrointestinal:  Positive for abdominal pain and nausea. Negative for diarrhea and vomiting.  ? ?Physical Exam ?Updated Vital Signs ?BP 124/71   Pulse 69   Temp 98.3 ?F (36.8 ?C) (Oral)   Resp 18   Ht 1.6 m ('5\' 3"'$ )   Wt 113.4 kg   SpO2 94%   BMI 44.29 kg/m?  ?Physical Exam ?CONSTITUTIONAL: Well developed/well nourished, uncomfortable appearing ?HEAD: Normocephalic/atraumatic ?EYES: EOMI/PERRL ?ENMT: Mucous membranes moist ?NECK: supple no meningeal signs ?SPINE/BACK:entire spine nontender ?CV: S1/S2 noted, no murmurs/rubs/gallops noted ?LUNGS: Lungs are clear to auscultation bilaterally, no apparent distress ?ABDOMEN: soft, moderate epigastric and right upper quadrant tenderness, no rebound or guarding, bowel sounds noted throughout abdomen ?GU:no cva tenderness ?NEURO: Pt is awake/alert/appropriate, moves all extremitiesx4.  No facial droop.   ?  EXTREMITIES: pulses normal/equalx4, full ROM ?SKIN: warm, color normal ?PSYCH: no abnormalities of mood noted, alert and oriented to situation ? ?ED Results / Procedures / Treatments   ?Labs ?(all labs ordered are listed, but only abnormal results are displayed) ?Labs Reviewed  ?COMPREHENSIVE METABOLIC PANEL - Abnormal; Notable for the following components:  ?    Result Value  ? AST 189 (*)   ? ALT 116 (*)   ? Total Bilirubin 2.9 (*)   ? All other components within normal limits  ?CBC WITH  DIFFERENTIAL/PLATELET  ?TROPONIN I (HIGH SENSITIVITY)  ? ? ?EKG ? ED ECG REPORT ? ? Date: 11/12/2021 0155am ? Rate: 82 ? Rhythm: normal sinus rhythm ? QRS Axis: normal ? Intervals: normal ? ST/T Wave abnormalities: normal ? Conduction Disutrbances:none ? Narrative Interpretation:  ? Old EKG Reviewed: unchanged ? ?I have personally reviewed the EKG tracing and agree with the computerized printout as noted. ? ? ?Radiology ?DG Chest 2 View ? ?Result Date: 11/11/2021 ?CLINICAL DATA:  Chest pressure with epigastric pain. EXAM: CHEST - 2 VIEW COMPARISON:  09/12/20 FINDINGS: The heart size and mediastinal contours are within normal limits. Both lungs are clear. The visualized skeletal structures are unremarkable. IMPRESSION: No active cardiopulmonary disease. Electronically Signed   By: Abigail Miyamoto M.D.   On: 11/11/2021 13:39   ? ?Procedures ?Procedures  ? ? ?Medications Ordered in ED ?Medications  ?fentaNYL (SUBLIMAZE) injection 100 mcg (100 mcg Intravenous Given 11/12/21 0145)  ?ondansetron [facility]) injection 4 mg (4 mg Intravenous Given 11/12/21 0145)  ?fentaNYL (SUBLIMAZE) injection 100 mcg (100 mcg Intravenous Given 11/12/21 0525)  ? ? ?ED Course/ Medical Decision Making/ A&P ?Clinical Course as of 11/12/21 0713  ?Sat Nov 12, 2021  ?0358 AST(!): 189 ?Transamanitis noted [DW]  ?6440 Pt improved, resting comfortably [DW]  ?3474 Patient feeling improved after second round of medicine.  Plan obtain right upper quadrant ultrasound [DW]  ?2595 Signed out to Dr. Roslynn Amble at shift change to f/u on [place] [DW]  ?  ?Clinical Course User Index ?[DW] Ripley Fraise, MD  ? ?                        ?Medical Decision Making ?Amount and/or Complexity of Data Reviewed ?Labs: ordered. Decision-making details documented in ED Course. ?Radiology: ordered. ?ECG/medicine tests: ordered. ? ?Risk ?Prescription drug management. ? ? ?This patient presents to the ED for concern of abdominal pain, this involves an extensive number of treatment options,  and is a complaint that carries with it a high risk of complications and morbidity.  The differential diagnosis includes but is not limited to pancreatitis, cholecystitis, cholelithiasis, gastritis, ACS ? ?Comorbidities that complicate the patient evaluation: ?Patient?s presentation is complicated by their history of obesity ? ? ?Additional history obtained: ? ?Records reviewed  recent ER labs reviewed ? ?Lab Tests: ?I Ordered, and personally interpreted labs.  The pertinent results include:  transamanitis  ? ?Cardiac Monitoring: ?The patient was maintained on a cardiac monitor.  I personally viewed and interpreted the cardiac monitor which showed an underlying rhythm of:  sinus rhythm ? ?Medicines ordered and prescription drug management: ?I ordered medication including fentanyl IV for pain ?Reevaluation of the patient after these medicines showed that the patient    improved ? ? ?Reevaluation: ?After the interventions noted above, I reevaluated the patient and found that they have :improved ? ?Complexity of problems addressed: ?Patient?s presentation is most consistent with  acute presentation with potential threat to life  or bodily function ? ? ? ? ? ? ? ? ?Final Clinical Impression(s) / ED Diagnoses ?Final diagnoses:  ?None  ? ? ?Rx / DC Orders ?ED Discharge Orders   ? ? None  ? ?  ? ? ?  ?Ripley Fraise, MD ?11/12/21 [phone] ? ?

## 2021-11-12 NOTE — ED Notes (Signed)
Pharmacy updated with patient 

## 2021-11-12 NOTE — ED Triage Notes (Signed)
Patient arrived via POV c/o epigastric pain with chest pressure. Patient seen previously for same earlier today. Patient states pain subsided for 1 hr then returned. Patient is AO x 4, VS WDL, normal gait. ?

## 2021-11-12 NOTE — Progress Notes (Signed)
Plan of Care Note for accepted transfer ? ? ?Patient: Danielle Garcia MRN: [id number]   [place]: 11/12/2021 ? ?Facility requesting transfer: [facility] ?Requesting Provider: Dr Roslynn Amble ?Reason for transfer: Biliary obstruction ?Facility course: 31 yo F presenting with RUQ ab pain, N/V. [place] was concerning for GB stones and dilation of CBD. Has transaminitis and elevated t bili. EDP spoke with [facility]. They recommended speaking w/ GI. [facility] rec'd getting MRCP and calling them if it is abnormal.  ? ?Plan of care: ?The patient is accepted for admission to [place]  unit, at [facility]..  ?While holding at [facility], medical decision making/orders will remain the responsibility of the ED staff. Upon arrival to [facility], [facility] will assume care.  ? ?Author: ?Jonnie Finner, DO ?11/12/2021 ? ?Check [email or web address] for on-call coverage. ? ?Nursing staff, Please call [place] number on Amion as soon as patient's arrival, so appropriate admitting provider can evaluate the pt. ? ?

## 2021-11-12 NOTE — ED Notes (Signed)
Pt. Reports she was here earlier in the ED at 11am on 4-28/23 and was given Pepcid Zofran and Malox.  Pt. Reports feeling better for a bit but now feeling bad again.  Pt. Reports vomiting yesterday and still feeling nauseated even now.  Pt. Reports abd. Pain and back pain in the low back on each side.   ? ? ?No issues with urination and normal BM  last time was 2 days ago.  Pt. In no distress just not feeling well per pt.   ? ?Pt. Reports she feels like she has a band around her waist area. ?

## 2021-11-13 DIAGNOSIS — K831 Obstruction of bile duct: Secondary | ICD-10-CM | POA: Diagnosis not present

## 2021-11-13 LAB — COMPREHENSIVE METABOLIC PANEL
ALT: 130 U/L — ABNORMAL HIGH (ref 0–44)
ALT: 115 U/L — ABNORMAL HIGH (ref 0–44)
AST: 106 U/L — ABNORMAL HIGH (ref 15–41)
AST: 72 U/L — ABNORMAL HIGH (ref 15–41)
Albumin: 3.5 g/dL (ref 3.5–5.0)
Albumin: 3.7 g/dL (ref 3.5–5.0)
Alkaline Phosphatase: 80 U/L (ref 38–126)
Alkaline Phosphatase: 80 U/L (ref 38–126)
Anion gap: 4 — ABNORMAL LOW (ref 5–15)
Anion gap: 4 — ABNORMAL LOW (ref 5–15)
BUN: 14 mg/dL (ref 6–20)
BUN: 10 mg/dL (ref 6–20)
CO2: 29 mmol/L (ref 22–32)
CO2: 28 mmol/L (ref 22–32)
Calcium: 8.5 mg/dL — ABNORMAL LOW (ref 8.9–10.3)
Calcium: 8.5 mg/dL — ABNORMAL LOW (ref 8.9–10.3)
Chloride: 108 mmol/L (ref 98–111)
Chloride: 107 mmol/L (ref 98–111)
Creatinine, Ser: 0.84 mg/dL (ref 0.44–1.00)
Creatinine, Ser: 0.68 mg/dL (ref 0.44–1.00)
GFR, Estimated: >60 mL/min (ref >60)
GFR, Estimated: >60 mL/min (ref >60)
Glucose, Bld: 87 mg/dL (ref 70–99)
Glucose, Bld: 89 mg/dL (ref 70–99)
Potassium: 4 mmol/L (ref 3.5–5.1)
Potassium: 3.6 mmol/L (ref 3.5–5.1)
Sodium: 141 mmol/L (ref 135–145)
Sodium: 139 mmol/L (ref 135–145)
Total Bilirubin: 2.8 mg/dL — ABNORMAL HIGH (ref 0.3–1.2)
Total Bilirubin: 1.5 mg/dL — ABNORMAL HIGH (ref 0.3–1.2)
Total Protein: 6.4 g/dL — ABNORMAL LOW (ref 6.5–8.1)
Total Protein: 6.7 g/dL (ref 6.5–8.1)

## 2021-11-13 LAB — CBC
HCT: 36.7 % (ref 36.0–46.0)
HCT: 37.9 % (ref 36.0–46.0)
Hemoglobin: 12 g/dL (ref 12.0–15.0)
Hemoglobin: 12.4 g/dL (ref 12.0–15.0)
MCH: 30.1 pg (ref 26.0–34.0)
MCH: 30.1 pg (ref 26.0–34.0)
MCHC: 32.7 g/dL (ref 30.0–36.0)
MCHC: 32.7 g/dL (ref 30.0–36.0)
MCV: 92 fL (ref 80.0–100.0)
MCV: 92 fL (ref 80.0–100.0)
Platelets: 279 10*3/uL (ref 150–400)
Platelets: 284 10*3/uL (ref 150–400)
RBC: 3.99 MIL/uL (ref 3.87–5.11)
RBC: 4.12 MIL/uL (ref 3.87–5.11)
RDW: 13 % (ref 11.5–15.5)
RDW: 13 % (ref 11.5–15.5)
WBC: 6.5 10*3/uL (ref 4.0–10.5)
WBC: 6.6 10*3/uL (ref 4.0–10.5)
nRBC: 0 % (ref 0.0–0.2)
nRBC: 0 % (ref 0.0–0.2)

## 2021-11-13 LAB — LIPASE, BLOOD
Lipase: 626 U/L — ABNORMAL HIGH (ref 11–51)
Lipase: 2932 U/L — ABNORMAL HIGH (ref 11–51)

## 2021-11-13 MED ORDER — LACTATED RINGERS IV SOLN: INTRAVENOUS | Status: DC

## 2021-11-13 MED ORDER — SODIUM CHLORIDE 0.9 % IV SOLN
2.0000 g | INTRAVENOUS | Status: DC
  Administered 2021-11-13 – 2021-11-14 (×2): 2 g via INTRAVENOUS
  Filled 2021-11-13 (×4): qty 20

## 2021-11-13 NOTE — Progress Notes (Addendum)
? ?PROGRESS NOTE ? ?Danielle Garcia YPP:[id number] DOB: 08/29/1990 DOA: 11/12/2021 ?PCP: Danielle Pal, DO ? ?HPI/Recap of past 24 hours: ?Danielle Garcia is a 31 y.o. female with medical history significant of morbid obesity. Presenting for the second time consecutively to [facility] ED with epigastric pain radiating through to her back.  Work-up in the ED revealed gallstones seen on abdominal ultrasound, common bile duct dilatation measuring 8.6 mm, elevated liver chemistries, total bilirubin of 2.9, mildly elevated lipase level.  She underwent MRCP.  General surgery was consulted.  Seen by Dr. Georgette Garcia with plan for lap chole on 11/14/2021 by Dr. Rosendo Garcia.  N.p.o. after midnight. ? ?12/13/2021: Patient was seen and examined at bedside.  Her abdominal pain was improved with narcotic analgesics.  Nausea is also improved. ? ?Assessment/Plan: ?Principal Problem: ?  Biliary obstruction ?Active Problems: ?  Morbid obesity ([place]) ?  Hyperbilirubinemia ?  Elevated LFTs ?  Abdominal pain ? ?Acute cholecystitis as evidenced by gallstones, mild pericholecystic fluid seen on abdominal ultrasound. ?Started on Rocephin 2 g daily, continue ?Plan for lap chole by general surgery, Dr. Rosendo Garcia and intraoperative cholangiogram by GI on 11/14/2021, delayed due to new diagnosis of acute pancreatitis. ?Continue pain management with bowel regimen ?Repeat labs this afternoon and in the morning ? ?Acute gallstones pancreatitis ?Presented with epigastric pain radiating to her back ?Initial lipase level 53 on admission, up trended to 626 on 11/13/2021 ?Starting IV fluid hydration ?Repeat CMP, CBC and lipase level this afternoon. ? ?Elevated Liver chemistries with concern for biliary obstruction ?No choledocholithiasis seen on MRCP ?Trend LFTs ?Repeat CMP in the morning ? ?Severe obesity ?BMI 44 ?Recommend weight loss outpatient with regular physical activity and healthy dieting. ? ? ? ? ? ?Code Status: Full code ? ?Family Communication: None at  bedside ? ?Disposition Plan: Likely will discharge to home after general surgery signed off. ? ? ?Consultants: ?General surgery ?GI. ? ?Procedures: ?None ?Plan lap chole and intraoperative cholangiogram on 11/14/2021. ? ?Antimicrobials: ?Rocephin started on 11/13/2021. ? ?DVT prophylaxis: ?Subcu Lovenox daily ? ?Status is: Observation ? ? ? ? ?Objective: ?Vitals:  ? 11/12/21 2206 11/13/21 0135 11/13/21 0543 11/13/21 0947  ?BP: 129/75 (!) 141/73 114/62 118/66  ?Pulse: (!) 59 79 64 67  ?Resp: '18 18 18 16  '$ ?Temp: 98 ?F (36.7 ?C) 97.9 ?F (36.6 ?C) 98.3 ?F (36.8 ?C) 98 ?F (36.7 ?C)  ?TempSrc: Oral     ?SpO2: 99% 100% 99% 95%  ?Weight:      ?Height:      ? ? ?Intake/Output Summary (Last 24 hours) at 11/13/2021 1129 ?Last data filed at 11/13/2021 0600 ?Gross per 24 hour  ?Intake 610 ml  ?Output 150 ml  ?Net 460 ml  ? ?Filed Weights  ? 11/12/21 0008  ?Weight: 113.4 kg  ? ? ?Exam: ? ?General: 31 y.o. year-old female well developed well nourished in no acute distress.  Alert and oriented x3. ?Cardiovascular: Regular rate and rhythm with no rubs or gallops.  No thyromegaly or JVD noted.   ?Respiratory: Clear to auscultation with no wheezes or rales. Good inspiratory effort. ?Abdomen: Soft mild epigastric tenderness with palpation.  Nondistended with normal bowel sounds x4 quadrants. ?Musculoskeletal: No lower extremity edema. 2/4 pulses in all 4 extremities. ?Skin: No ulcerative lesions noted or rashes, ?Psychiatry: Mood is appropriate for condition and setting ? ? ?Data Reviewed: ?CBC: ?Recent Labs  ?Lab 11/11/21 ?1328 11/12/21 ?0128 11/12/21 ?1547 11/13/21 ?0402  ?WBC 9.6 8.3 5.2 6.5  ?NEUTROABS  --  6.3  --   --   ?  HGB 13.5 12.9 12.5 12.0  ?HCT 40.3 38.0 37.7 36.7  ?MCV 89.4 88.4 90.6 92.0  ?PLT 343 338 285 279  ? ?Basic Metabolic Panel: ?Recent Labs  ?Lab 11/11/21 ?1328 11/12/21 ?0128 11/12/21 ?1547 11/13/21 ?0402  ?NA 137 139  --  141  ?K 3.6 3.6  --  4.0  ?CL 104 107  --  108  ?CO2 26 25  --  29  ?GLUCOSE 102* 96  --  87   ?BUN 14 14  --  14  ?CREATININE 0.69 0.67 0.75 0.84  ?CALCIUM 8.9 9.1  --  8.5*  ? ?GFR: ?Estimated Creatinine Clearance: 117.7 mL/min (by C-G formula based on SCr of 0.84 mg/dL). ?Liver Function Tests: ?Recent Labs  ?Lab 11/12/21 ?0128 11/13/21 ?0402  ?AST 189* 106*  ?ALT 116* 130*  ?ALKPHOS 71 80  ?BILITOT 2.9* 2.8*  ?PROT 6.9 6.4*  ?ALBUMIN 3.8 3.5  ? ?Recent Labs  ?Lab 11/11/21 ?1328 11/12/21 ?1102  ?LIPASE 46 53*  ? ?No results for input(s): AMMONIA in the last 168 hours. ?Coagulation Profile: ?No results for input(s): INR, PROTIME in the last 168 hours. ?Cardiac Enzymes: ?No results for input(s): CKTOTAL, CKMB, CKMBINDEX, TROPONINI in the last 168 hours. ?BNP (last 3 results) ?No results for input(s): PROBNP in the last 8760 hours. ?HbA1C: ?No results for input(s): HGBA1C in the last 72 hours. ?CBG: ?No results for input(s): GLUCAP in the last 168 hours. ?Lipid Profile: ?No results for input(s): CHOL, HDL, LDLCALC, TRIG, CHOLHDL, LDLDIRECT in the last 72 hours. ?Thyroid Function Tests: ?No results for input(s): TSH, T4TOTAL, FREET4, T3FREE, THYROIDAB in the last 72 hours. ?Anemia Panel: ?No results for input(s): VITAMINB12, FOLATE, FERRITIN, TIBC, IRON, RETICCTPCT in the last 72 hours. ?Urine analysis: ?   ?Component Value Date/Time  ? [place] YELLOW 11/11/2021 1722  ? APPEARANCEUR HAZY (A) 11/11/2021 1722  ? LABSPEC 1.020 11/11/2021 1722  ? PHURINE 8.5 (H) 11/11/2021 1722  ? GLUCOSEU NEGATIVE 11/11/2021 1722  ? GLUCOSEU 250 (A) 09/24/2018 1246  ? [place] NEGATIVE 11/11/2021 1722  ? BILIRUBINUR SMALL (A) 11/11/2021 1722  ? BILIRUBINUR negative 08/05/2020 0905  ? [place] NEGATIVE 11/11/2021 1722  ? PROTEINUR 100 (A) 11/11/2021 1722  ? UROBILINOGEN 0.2 08/05/2020 0905  ? UROBILINOGEN 1.0 09/24/2018 1246  ? NITRITE NEGATIVE 11/11/2021 1722  ? LEUKOCYTESUR NEGATIVE 11/11/2021 1722  ? ?Sepsis Labs: ?'@LABRCNTIP'$ (procalcitonin:4,lacticidven:4) ? ?)No results found for this or any previous visit (from the past 240  hour(s)).  ? ? ?Studies: ?MR 3D Recon At Scanner ? ?Result Date: 11/12/2021 ?CLINICAL DATA:  Right upper quadrant abdominal pain. Nondiagnostic ultrasound. EXAM: MRI ABDOMEN WITHOUT AND WITH CONTRAST (INCLUDING MRCP) TECHNIQUE: Multiplanar multisequence MR imaging of the abdomen was performed both before and after the administration of intravenous contrast. Heavily T2-weighted images of the biliary and pancreatic ducts were obtained, and three-dimensional MRCP images were rendered by post processing. CONTRAST:  9m GADAVIST GADOBUTROL 1 MMOL/ML IV SOLN COMPARISON:  Abdominal sonogram 11/12/2021. FINDINGS: Lower chest: No acute findings. Hepatobiliary: Postcontrast imaging is limited due to motion artifact. Mild hepatic steatosis. No suspicious liver abnormality. Gallstones are identified which measure up to 1.1 cm. Mild wall thickening of the gallbladder scratch set the gallbladder wall is upper limits of normal measuring 2.7 mm. No significant pericholecystic fluid or surrounding inflammatory fat stranding. No bile duct dilatation. Common bile duct is mildly increased in caliber measuring 8 mm, image 1/9. Focal area of distal common bile duct narrowing measuring 5 mm in length is identified just before the ampulla.  No signs of choledocholithiasis. Pancreas: No mass, inflammatory changes, or other parenchymal abnormality identified. There is equivocal edema involving the pancreatic parenchyma. No significant peripancreatic fat stranding or free fluid. No mass or main duct dilatation. Spleen:  Within normal limits in size and appearance. Adrenals/Urinary Tract: Normal appearance of the adrenal glands and kidneys. No renal mass or hydronephrosis identified. Stomach/Bowel: Visualized portions within the abdomen are unremarkable. Vascular/Lymphatic: Normal caliber abdominal aorta. No abdominal adenopathy. Other:  No free fluid or fluid collections. Musculoskeletal: No suspicious bone lesions identified. IMPRESSION: 1.  Gallstones. No significant pericholecystic fluid or surrounding inflammatory fat stranding to suggest acute cholecystitis. 2. Mild increase caliber of the common bile duct with focal area of distal common bi

## 2021-11-13 NOTE — TOC Initial Note (Signed)
Transition of Care (TOC) - Initial/Assessment Note  ? ? ?Patient Details  ?Name: Danielle Garcia ?MRN: [id number] ?Date of Birth: [birth year] ? ?Transition of Care (TOC) CM/SW Contact:    ?Tawanna Cooler, RN ?Phone Number: ?11/13/2021, 9:33 AM ? ?Clinical Narrative:                 ? ?Transition of Care Department [facility]) has reviewed patient and no TOC needs have been identified at this time. We will continue to monitor patient advancement through interdisciplinary progression rounds. If new patient transition needs arise, please place a TOC consult. ? ? ? ?Expected Discharge Plan: Home/Self Care ?Barriers to Discharge: Continued Medical Work up ? ?Expected Discharge Plan and Services ?Expected Discharge Plan: Home/Self Care ?  ?  ?Living arrangements for the past 2 months: Apartment ?                ?  ?Activities of Daily Living ?Home Assistive Devices/Equipment: None ?ADL Screening (condition at time of admission) ?Patient's cognitive ability adequate to safely complete daily activities?: Yes ?Is the patient deaf or have difficulty hearing?: No ?Does the patient have difficulty seeing, even when wearing glasses/contacts?: No ?Does the patient have difficulty concentrating, remembering, or making decisions?: No ?Patient able to express need for assistance with ADLs?: Yes ?Does the patient have difficulty dressing or bathing?: No ?Independently performs ADLs?: Yes (appropriate for developmental age) ?Does the patient have difficulty walking or climbing stairs?: No ?Weakness of Legs: None ?Weakness of Arms/Hands: None ? ?Emotional Assessment ? ?Orientation: : Oriented to Self, Oriented to Place, Oriented to  Time, Oriented to Situation ?Alcohol / Substance Use: Not Applicable ?Psych Involvement: No (comment) ? ?Admission diagnosis:  Biliary obstruction [K83.1] ?Patient Active Problem List  ? Diagnosis Date Noted  ? Biliary obstruction 11/12/2021  ? Hyperbilirubinemia 11/12/2021  ? Elevated LFTs 11/12/2021  ?  Abdominal pain 11/12/2021  ? Plantar fasciitis 01/07/2021  ? 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 History of pulmonary embolism 02/17/2020  ? Asthma 10/07/2019  ? Vitamin D deficiency 08/04/2019  ? Morbid obesity ([place]) 02/07/2019  ? Gastroesophageal reflux disease 11/27/2018  ? Back pain 09/25/2018  ? Urinary frequency 09/25/2018  ? Other fatigue 02/05/2018  ? Shortness of breath on exertion 02/05/2018  ? Migraine 02/05/2018  ? Essential hypertension 02/05/2018  ? 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 ?PCP:  [name], DO ?Pharmacy:   ?[facility] ?[address] [place] [address] ?Phone: [phone] Fax: [phone] ? ?[facility] ?[address] ?[place] [address] ?Phone: [phone] Fax: [phone] ? ?[id number] - [place], [place] - [address] ?[address] ?[place] [address] ?Phone: [phone] Fax: [phone] ? ? ?

## 2021-11-13 NOTE — Consult Note (Addendum)
Reason for Consult:  Gallstones/ elevated bilirubin ?Referring Physician: Dr. Irene Pap ? ?Danielle Garcia is an 31 y.o. female.  ?HPI: This is a 31 year old female with previous c-sections who presented to the ED yesterday with sharp epigastric pain radiating through to her back.  She was found to have gallstones, mild pericholecystic fluid, negative sonographic Murphy's sign, and total bilirubin of 2.9.  She was admitted to the hospital and underwent MRCP.  This showed no sign of biliary obstruction, but mild increase in the caliber of the CBD with focal distal narrowing.  Lipase was mildly elevated and there may be some mild edema around the pancreas. ? ?Past Medical History:  ?Diagnosis Date  ? Anxiety   ? Asthma   ? as a child  ? Depression   ? doing ok now  ? Fibroid   ? Floaters   ? GERD (gastroesophageal reflux disease)   ? HTN (hypertension)   ? 2014/15 no meds since then  ? Infection   ? UTI  ? Migraine   ? Pre-diabetes   ? Pulmonary embolus ([place])   ? after first delivery  ? ? ?Past Surgical History:  ?Procedure Laterality Date  ? CESAREAN SECTION    ? CESAREAN SECTION N/A 09/07/2020  ? Procedure: CESAREAN SECTION;  Surgeon: Mora Bellman, MD;  Location: [facility];  Service: Obstetrics;  Laterality: N/A;  ? DILATION AND EVACUATION N/A 07/02/2018  ? Procedure: DILATATION AND EVACUATION;  Surgeon: Aletha Halim, MD;  Location: [facility];  Service: Gynecology;  Laterality: N/A;  ? WISDOM TOOTH EXTRACTION    ? ? ?Family History  ?Problem Relation Age of Onset  ? Hypertension Mother   ? Heart attack Father 33  ? Obesity Father   ? Hypertension Maternal Grandmother   ? Diabetes Maternal Grandmother   ? Lung cancer Maternal Grandfather   ? Diabetes Paternal Grandmother   ? Allergies Daughter   ? ? ?Social History:  reports that she has never smoked. She has never used smokeless tobacco. She reports that she does not currently use alcohol. She reports that she does not use drugs. ? ?Allergies: No Known  Allergies ? ?Medications:  ?Prior to Admission medications   ?Medication Sig Start Date End Date Taking? Authorizing Provider  ?alum & mag hydroxide-simeth (MAALOX MAX) 400-400-40 MG/5ML suspension Take 10 mLs by mouth every 6 (six) hours as needed for indigestion. 1/49/70  Yes Campbell Stall P, DO  ?famotidine (PEPCID) 20 MG tablet Take 1 tablet (20 mg total) by mouth 2 (two) times daily. 2/63/78  Yes Campbell Stall P, DO  ?levocetirizine (XYZAL) 5 MG tablet Take 1 tablet (5 mg total) by mouth every evening. ?Patient taking differently: Take 5 mg by mouth at bedtime as needed for allergies. 08/10/21  Yes [name], DO  ?meloxicam (MOBIC) 15 MG tablet Take 1 tablet (15 mg total) by mouth daily. ?Patient taking differently: Take 15 mg by mouth daily as needed for pain. 10/12/21  Yes [name], DO  ?mometasone (NASONEX) 50 MCG/ACT nasal spray Place 2 sprays into the nose daily. ?Patient taking differently: Place 2 sprays into the nose daily as needed (allergies). 02/21/21  Yes [name], DO  ?ondansetron (ZOFRAN) 4 MG tablet Take 1 tablet (4 mg total) by mouth every 6 (six) hours. ?Patient taking differently: Take 4 mg by mouth every 6 (six) hours as needed for nausea or vomiting. 5/88/50  Yes Campbell Stall P, DO  ?rizatriptan (MAXALT-MLT) 5 MG disintegrating tablet Take  1 tablet (5 mg total) by mouth as needed for migraine. May repeat in 2 hours if needed 01/07/21  Yes Wendling, Crosby Oyster, DO  ?Semaglutide, 1 MG/DOSE, 4 MG/3ML SOPN Inject 1 mg as directed once a week. 08/10/21  Yes [name], DO  ?tiZANidine (ZANAFLEX) 4 MG tablet Take 1 tablet (4 mg total) by mouth every 6 (six) hours as needed for muscle spasms. 10/11/21  Yes [name], DO  ? ? ? ?Results for orders placed or performed during the hospital encounter of 11/12/21 (from the past 48 hour(s))  ?Comprehensive metabolic panel     Status: Abnormal  ? Collection Time: 11/12/21  1:28 AM  ?Result  Value Ref Range  ? Sodium 139 135 - 145 mmol/L  ? Potassium 3.6 3.5 - 5.1 mmol/L  ? Chloride 107 98 - 111 mmol/L  ? CO2 25 22 - 32 mmol/L  ? Glucose, Bld 96 70 - 99 mg/dL  ?  Comment: Glucose reference range applies only to samples taken after fasting for at least 8 hours.  ? BUN 14 6 - 20 mg/dL  ? Creatinine, Ser 0.67 0.44 - 1.00 mg/dL  ? Calcium 9.1 8.9 - 10.3 mg/dL  ? Total Protein 6.9 6.5 - 8.1 g/dL  ? Albumin 3.8 3.5 - 5.0 g/dL  ? AST 189 (H) 15 - 41 U/L  ? ALT 116 (H) 0 - 44 U/L  ? Alkaline Phosphatase 71 38 - 126 U/L  ? Total Bilirubin 2.9 (H) 0.3 - 1.2 mg/dL  ? GFR, Estimated >60 >60 mL/min  ?  Comment: (NOTE) ?Calculated using the CKD-EPI Creatinine Equation (2021) ?  ? Anion gap 7 5 - 15  ?  Comment: Performed at [facility], [address] [place], [place] [address]  ?Troponin I (High Sensitivity)     Status: None  ? Collection Time: 11/12/21  1:28 AM  ?Result Value Ref Range  ? Troponin I (High Sensitivity) 3 <18 ng/L  ?  Comment: (NOTE) ?Elevated high sensitivity troponin I (hsTnI) values and significant  ?changes across serial measurements may suggest ACS but many other  ?chronic and acute conditions are known to elevate hsTnI results.  ?Refer to the "Links" section for chest pain algorithms and additional  ?guidance. ?Performed at [facility], [address] [place], [place] [address] ?  ?CBC with Differential/Platelet     Status: None  ? Collection Time: 11/12/21  1:28 AM  ?Result Value Ref Range  ? WBC 8.3 4.0 - 10.5 K/uL  ? RBC 4.30 3.87 - 5.11 MIL/uL  ? Hemoglobin 12.9 12.0 - 15.0 g/dL  ? HCT 38.0 36.0 - 46.0 %  ? MCV 88.4 80.0 - 100.0 fL  ? MCH 30.0 26.0 - 34.0 pg  ? MCHC 33.9 30.0 - 36.0 g/dL  ? RDW 12.8 11.5 - 15.5 %  ? Platelets 338 150 - 400 K/uL  ? Neutrophils Relative % 76 %  ? Neutro Abs 6.3 1.7 - 7.7 K/uL  ? Lymphocytes Relative 16 %  ? Lymphs Abs 1.4 0.7 - 4.0 K/uL  ? Monocytes Relative 8 %  ? Monocytes Absolute 0.6 0.1 - 1.0 K/uL  ? Eosinophils Relative 0 %  ?  Eosinophils Absolute 0.0 0.0 - 0.5 K/uL  ? Basophils Relative 0 %  ? Basophils Absolute 0.0 0.0 - 0.1 K/uL  ? Immature Granulocytes 0 %  ? Abs Immature Granulocytes 0.02 0.00 - 0.07 K/uL  ?  Comment: Performed at [facility], [address] [place], [place] [address]  ?Lipase, blood     Status: Abnormal  ? Collection Time: 11/12/21 11:02 AM  ?Result Value Ref Range  ? Lipase 53 (H) 11 - 51 U/L  ?  Comment: Performed at [facility], [address] [place], [place] [address]  ?HIV Antibody (routine testing w rflx)     Status: None  ? Collection Time: 11/12/21  3:47 PM  ?Result Value Ref Range  ? HIV Screen 4th Generation wRfx Non Reactive Non Reactive  ?  Comment: Performed at [facility], [place] [address], [place], [place] [address]  ?CBC     Status: None  ? Collection Time: 11/12/21  3:47 PM  ?Result Value Ref Range  ? WBC 5.2 4.0 - 10.5 K/uL  ? RBC 4.16 3.87 - 5.11 MIL/uL  ? Hemoglobin 12.5 12.0 - 15.0 g/dL  ? HCT 37.7 36.0 - 46.0 %  ? MCV 90.6 80.0 - 100.0 fL  ? MCH 30.0 26.0 - 34.0 pg  ? MCHC 33.2 30.0 - 36.0 g/dL  ? RDW 13.0 11.5 - 15.5 %  ? Platelets 285 150 - 400 K/uL  ? nRBC 0.0 0.0 - 0.2 %  ?  Comment: Performed at [facility], [place] [address], [place], [place] [address]  ?Creatinine, serum     Status: None  ? Collection Time: 11/12/21  3:47 PM  ?Result Value Ref Range  ? Creatinine, Ser 0.75 0.44 - 1.00 mg/dL  ? GFR, Estimated >60 >60 mL/min  ?  Comment: (NOTE) ?Calculated using the CKD-EPI Creatinine Equation (2021) ?Performed at [facility], [address] ?[place], [place] [address] ?  ?Comprehensive metabolic panel     Status: Abnormal  ? Collection Time: 11/13/21  4:02 AM  ?Result Value Ref Range  ? Sodium 141 135 - 145 mmol/L  ? Potassium 4.0 3.5 - 5.1 mmol/L  ? Chloride 108 98 - 111 mmol/L  ? CO2 29 22 - 32 mmol/L  ? Glucose, Bld 87 70 - 99 mg/dL  ?  Comment: Glucose reference range applies only to samples taken after fasting for  at least 8 hours.  ? BUN 14 6 - 20 mg/dL  ? Creatinine, Ser 0.84 0.44 - 1.00 mg/dL  ? Calcium 8.5 (L) 8.9 - 10.3 mg/dL  ? Total Protein 6.4 (L) 6.5 - 8.1 g/dL  ? Albumin 3.5 3.5 - 5.0 g/dL  ? AST 106 (H) 15 -

## 2021-11-14 ENCOUNTER — Encounter (HOSPITAL_COMMUNITY)
Admission: EM | Disposition: A | Discharge status: Home / Self Care | Payer: Self-pay | Source: Home / Self Care | Attending: Internal Medicine

## 2021-11-14 DIAGNOSIS — K831 Obstruction of bile duct: Secondary | ICD-10-CM | POA: Diagnosis not present

## 2021-11-14 LAB — CBC WITH DIFFERENTIAL/PLATELET
Abs Immature Granulocytes: 0.02 K/uL (ref 0.00–0.07)
Basophils Absolute: 0 K/uL (ref 0.0–0.1)
Basophils Relative: 0 %
Eosinophils Absolute: 0.1 K/uL (ref 0.0–0.5)
Eosinophils Relative: 1 %
HCT: 37.3 % (ref 36.0–46.0)
Hemoglobin: 12 g/dL (ref 12.0–15.0)
Immature Granulocytes: 0 %
Lymphocytes Relative: 32 %
Lymphs Abs: 2.3 K/uL (ref 0.7–4.0)
MCH: 29.7 pg (ref 26.0–34.0)
MCHC: 32.2 g/dL (ref 30.0–36.0)
MCV: 92.3 fL (ref 80.0–100.0)
Monocytes Absolute: 0.5 K/uL (ref 0.1–1.0)
Monocytes Relative: 7 %
Neutro Abs: 4.3 K/uL (ref 1.7–7.7)
Neutrophils Relative %: 60 %
Platelets: 264 K/uL (ref 150–400)
RBC: 4.04 MIL/uL (ref 3.87–5.11)
RDW: 12.8 % (ref 11.5–15.5)
WBC: 7.2 K/uL (ref 4.0–10.5)
nRBC: 0 % (ref 0.0–0.2)

## 2021-11-14 LAB — LIPASE, BLOOD
Lipase: 883 U/L — ABNORMAL HIGH (ref 11–51)
Lipase: 977 U/L — ABNORMAL HIGH (ref 11–51)

## 2021-11-14 LAB — COMPREHENSIVE METABOLIC PANEL
ALT: 87 U/L — ABNORMAL HIGH (ref 0–44)
AST: 39 U/L (ref 15–41)
Albumin: 3.4 g/dL — ABNORMAL LOW (ref 3.5–5.0)
Alkaline Phosphatase: 73 U/L (ref 38–126)
Anion gap: 3 — ABNORMAL LOW (ref 5–15)
BUN: 8 mg/dL (ref 6–20)
CO2: 27 mmol/L (ref 22–32)
Calcium: 8.6 mg/dL — ABNORMAL LOW (ref 8.9–10.3)
Chloride: 109 mmol/L (ref 98–111)
Creatinine, Ser: 0.75 mg/dL (ref 0.44–1.00)
GFR, Estimated: >60 mL/min (ref >60)
Glucose, Bld: 72 mg/dL (ref 70–99)
Potassium: 3.8 mmol/L (ref 3.5–5.1)
Sodium: 139 mmol/L (ref 135–145)
Total Bilirubin: 1.3 mg/dL — ABNORMAL HIGH (ref 0.3–1.2)
Total Protein: 6.5 g/dL (ref 6.5–8.1)

## 2021-11-14 LAB — PHOSPHORUS: Phosphorus: 3.2 mg/dL (ref 2.5–4.6)

## 2021-11-14 LAB — MAGNESIUM: Magnesium: 2 mg/dL (ref 1.7–2.4)

## 2021-11-14 SURGERY — LAPAROSCOPIC CHOLECYSTECTOMY WITH INTRAOPERATIVE CHOLANGIOGRAM
Anesthesia: General

## 2021-11-14 MED ORDER — LACTATED RINGERS IV SOLN: INTRAVENOUS | Status: DC

## 2021-11-14 NOTE — Progress Notes (Signed)
Progress Note     Subjective: Pt reports she is still having epigastric pain when pain medication wears off, just got meds around 6 AM this morning. Denies n/v. No BM since Friday. Would like to increase diet some, denies pain with intake of CLD. Discussed that laparoscopic cholecystectomy is recommended during this admission but that labs need to be improving and pain clinically needs to be improved prior to OR and patient verbalized understanding.   Objective: Vital signs in last 24 hours: Temp:  [98 F (36.7 C)-98.7 F (37.1 C)] 98.2 F (36.8 C) (05/01 0535) Pulse Rate:  [66-90] 74 (05/01 0535) Resp:  [16-18] 18 (05/01 0535) BP: (118-135)/(66-82) 123/67 (05/01 0535) SpO2:  [95 %-99 %] 95 % (05/01 0535) Last BM Date : 11/11/21  Intake/Output from previous day: 04/30 0701 - 05/01 0700 In: 2589.1 [P.O.:480; I.V.:2009.1; IV Piggyback:100] Out: 1000 [Urine:1000] Intake/Output this shift: No intake/output data recorded.  PE: General: pleasant, WD, obese female who is laying in bed in NAD HEENT: sclera anicteric Heart: regular, rate, and rhythm.   Lungs:  Respiratory effort nonlabored Abd: soft, mild ttp in epigastrium without peritonitis, ND, +BS, no masses, hernias, or organomegaly MS: all 4 extremities are symmetrical with no cyanosis, clubbing, or edema. Skin: warm and dry with no masses, lesions, or rashes Neuro: Cranial nerves 2-12 grossly intact, sensation is normal throughout Psych: A&Ox3 with an appropriate affect.    Lab Results:  Recent Labs    11/13/21 1426 11/14/21 0348  WBC 6.6 7.2  HGB 12.4 12.0  HCT 37.9 37.3  PLT 284 264   BMET Recent Labs    11/13/21 1426 11/14/21 0348  NA 139 139  K 3.6 3.8  CL 107 109  CO2 28 27  GLUCOSE 89 72  BUN 10 8  CREATININE 0.68 0.75  CALCIUM 8.5* 8.6*   PT/INR No results for input(s): LABPROT, INR in the last 72 hours. CMP     Component Value Date/Time   NA 139 11/14/2021 0348   NA 136 08/06/2020 1019    K 3.8 11/14/2021 0348   CL 109 11/14/2021 0348   CO2 27 11/14/2021 0348   GLUCOSE 72 11/14/2021 0348   BUN 8 11/14/2021 0348   BUN 8 08/06/2020 1019   CREATININE 0.75 11/14/2021 0348   CREATININE 0.55 03/09/2020 0726   CALCIUM 8.6 (L) 11/14/2021 0348   PROT 6.5 11/14/2021 0348   PROT 6.5 08/06/2020 1019   ALBUMIN 3.4 (L) 11/14/2021 0348   ALBUMIN 3.7 (L) 08/06/2020 1019   AST 39 11/14/2021 0348   ALT 87 (H) 11/14/2021 0348   ALKPHOS 73 11/14/2021 0348   BILITOT 1.3 (H) 11/14/2021 0348   BILITOT 0.3 08/06/2020 1019   GFRNONAA >60 11/14/2021 0348   GFRAA 145 08/06/2020 1019   Lipase     Component Value Date/Time   LIPASE 883 (H) 11/14/2021 0348       Studies/Results: MR 3D Recon At Scanner  Result Date: 11/12/2021 CLINICAL DATA:  Right upper quadrant abdominal pain. Nondiagnostic ultrasound. EXAM: MRI ABDOMEN WITHOUT AND WITH CONTRAST (INCLUDING MRCP) TECHNIQUE: Multiplanar multisequence MR imaging of the abdomen was performed both before and after the administration of intravenous contrast. Heavily T2-weighted images of the biliary and pancreatic ducts were obtained, and three-dimensional MRCP images were rendered by post processing. CONTRAST:  10mL GADAVIST GADOBUTROL 1 MMOL/ML IV SOLN COMPARISON:  Abdominal sonogram 11/12/2021. FINDINGS: Lower chest: No acute findings. Hepatobiliary: Postcontrast imaging is limited due to motion artifact. Mild hepatic steatosis. No  suspicious liver abnormality. Gallstones are identified which measure up to 1.1 cm. Mild wall thickening of the gallbladder scratch set the gallbladder wall is upper limits of normal measuring 2.7 mm. No significant pericholecystic fluid or surrounding inflammatory fat stranding. No bile duct dilatation. Common bile duct is mildly increased in caliber measuring 8 mm, image 1/9. Focal area of distal common bile duct narrowing measuring 5 mm in length is identified just before the ampulla. No signs of choledocholithiasis.  Pancreas: No mass, inflammatory changes, or other parenchymal abnormality identified. There is equivocal edema involving the pancreatic parenchyma. No significant peripancreatic fat stranding or free fluid. No mass or main duct dilatation. Spleen:  Within normal limits in size and appearance. Adrenals/Urinary Tract: Normal appearance of the adrenal glands and kidneys. No renal mass or hydronephrosis identified. Stomach/Bowel: Visualized portions within the abdomen are unremarkable. Vascular/Lymphatic: Normal caliber abdominal aorta. No abdominal adenopathy. Other:  No free fluid or fluid collections. Musculoskeletal: No suspicious bone lesions identified. IMPRESSION: 1. Gallstones. No significant pericholecystic fluid or surrounding inflammatory fat stranding to suggest acute cholecystitis. 2. Mild increase caliber of the common bile duct with focal area of distal common bile duct narrowing measuring 5 mm in length. This may reflect distal common bile duct stricture secondary to previously passed gallstone no signs of choledocholithiasis identified at this time. 3. Equivocal edema involving the pancreatic parenchyma. No significant peripancreatic fat stranding or free fluid. Correlate for any clinical signs or symptoms of acute pancreatitis. 4. Mild hepatic steatosis. Electronically Signed   By: Signa Kell M.D.   On: 11/12/2021 15:17   MR ABDOMEN MRCP W WO CONTAST  Result Date: 11/12/2021 CLINICAL DATA:  Right upper quadrant abdominal pain. Nondiagnostic ultrasound. EXAM: MRI ABDOMEN WITHOUT AND WITH CONTRAST (INCLUDING MRCP) TECHNIQUE: Multiplanar multisequence MR imaging of the abdomen was performed both before and after the administration of intravenous contrast. Heavily T2-weighted images of the biliary and pancreatic ducts were obtained, and three-dimensional MRCP images were rendered by post processing. CONTRAST:  10mL GADAVIST GADOBUTROL 1 MMOL/ML IV SOLN COMPARISON:  Abdominal sonogram 11/12/2021.  FINDINGS: Lower chest: No acute findings. Hepatobiliary: Postcontrast imaging is limited due to motion artifact. Mild hepatic steatosis. No suspicious liver abnormality. Gallstones are identified which measure up to 1.1 cm. Mild wall thickening of the gallbladder scratch set the gallbladder wall is upper limits of normal measuring 2.7 mm. No significant pericholecystic fluid or surrounding inflammatory fat stranding. No bile duct dilatation. Common bile duct is mildly increased in caliber measuring 8 mm, image 1/9. Focal area of distal common bile duct narrowing measuring 5 mm in length is identified just before the ampulla. No signs of choledocholithiasis. Pancreas: No mass, inflammatory changes, or other parenchymal abnormality identified. There is equivocal edema involving the pancreatic parenchyma. No significant peripancreatic fat stranding or free fluid. No mass or main duct dilatation. Spleen:  Within normal limits in size and appearance. Adrenals/Urinary Tract: Normal appearance of the adrenal glands and kidneys. No renal mass or hydronephrosis identified. Stomach/Bowel: Visualized portions within the abdomen are unremarkable. Vascular/Lymphatic: Normal caliber abdominal aorta. No abdominal adenopathy. Other:  No free fluid or fluid collections. Musculoskeletal: No suspicious bone lesions identified. IMPRESSION: 1. Gallstones. No significant pericholecystic fluid or surrounding inflammatory fat stranding to suggest acute cholecystitis. 2. Mild increase caliber of the common bile duct with focal area of distal common bile duct narrowing measuring 5 mm in length. This may reflect distal common bile duct stricture secondary to previously passed gallstone no signs of choledocholithiasis identified at  this time. 3. Equivocal edema involving the pancreatic parenchyma. No significant peripancreatic fat stranding or free fluid. Correlate for any clinical signs or symptoms of acute pancreatitis. 4. Mild hepatic  steatosis. Electronically Signed   By: Signa Kell M.D.   On: 11/12/2021 15:17    Anti-infectives: Anti-infectives (From admission, onward)    Start     Dose/Rate Route Frequency Ordered Stop   11/13/21 1130  cefTRIAXone (ROCEPHIN) 2 g in sodium chloride 0.9 % 100 mL IVPB        2 g 200 mL/hr over 30 Minutes Intravenous Every 24 hours 11/13/21 1046          Assessment/Plan Biliary pancreatitis  - RUQ [place] 4/29: gallstones and trace pericholecystic fluid, increased caliber of CBD to 8.6 mm - MRCP 4/29: gallstones without signs of acute cholecystitis, possible CBD stricture vs previously passed CBD stone, equivocal edema of pancreatic parenchyma, mild hepatic steatosis - lipase minimally elevated at 53 on presentation then elevated to 2932 yesterday - back down to 883 today but still clinically having some epigastric pain - LFTs are all down trending, suggestive of potentially having passed a CBD stone - exam, history and imaging all more consistent with biliary pancreatitis rather than acute cholecystitis - would recommend laparoscopic cholecystectomy this admission when lipase closer to normal and pain is clinically better  - will make NPO after MN and recheck labs in the AM - I have explained the procedure, risks, and aftercare of Laparoscopic cholecystectomy with IOC. Risks include but are not limited to anesthesia (MI, CVA, death), bleeding, infection, wound problems, hernia, bile leak, injury to common bile duct/liver/intestine, increased risk of DVT/PE and diarrhea post op. She seems to understand and agrees to proceed.   FEN: CLD - ok to advance from surgical standpoint if GI ok with it; IVF @125cc /h VTE: LMWH ID: rocephin 4/30>>  Morbid obesity - BMI 44.29   LOS: 2 days     Juliet Rude, [facility] Surgery 11/14/2021, 9:32 AM Please see Amion for pager number during day hours 7:00am-4:30pm

## 2021-11-14 NOTE — Progress Notes (Addendum)
? ?PROGRESS NOTE ? ?Danielle Garcia NOM:[id number] DOB: April 08, 1991 DOA: 11/12/2021 ?PCP: [name], DO ? ?HPI/Recap of past 24 hours: ?Danielle Garcia is a 31 y.o. female with medical history significant of morbid obesity. Presenting for the second time consecutively to [facility] ED with epigastric pain radiating through to her back.  Work-up in the ED revealed gallstones seen on abdominal ultrasound, common bile duct dilatation measuring 8.6 mm, elevated liver chemistries, total bilirubin of 2.9, mildly elevated lipase level.  She underwent MRCP, no signs of choledocholithiasis.  Repeated lipase level up trended, up to 2900 from 53.  General surgery was consulted.  Seen by Dr. Georgette Dover.  Plan for lap chole on 11/15/2021 by Dr. Rosendo Gros.  N.p.o. after midnight. ? ?11/14/2021: Patient was seen and examined at bedside.  Her pain is mainly at the epigastric region.  No nausea at this time.  No right upper quadrant pain.  Her liver chemistries levels are downtrending. ? ?Assessment/Plan: ?Principal Problem: ?  Biliary obstruction ?Active Problems: ?  Morbid obesity ([place]) ?  Hyperbilirubinemia ?  Elevated LFTs ?  Abdominal pain ? ?Acute cholecystitis as evidenced by gallstones, mild pericholecystic fluid seen on abdominal ultrasound. ?Started on Rocephin 2 g daily on 4/30, continue ?Plan for lap chole by general surgery, Dr. Rosendo Gros and intraoperative cholangiogram by GI on 11/15/2021, delayed due to new diagnosis of acute pancreatitis. ?Continue pain management with bowel regimen ?Repeat labs in the morning ? ?Acute gallstones pancreatitis ?Presented with epigastric pain radiating to her back ?Initial lipase level 53 on admission, up trended to 626 then 2900 on 11/13/2021 ?Continue IV fluid hydration, decrease rate to 100 cc/h ?Repeat CMP, CBC and lipase level in the AM ? ?Improving elevated Liver chemistries with concern for biliary obstruction ?No choledocholithiasis seen on MRCP ?Trend LFTs ?Repeat CMP in the  morning ? ?Hyperbilirubinemia ?Total bilirubin peaked at 2.8, downtrending to 1.3. ? ?Severe obesity ?BMI 44 ?Recommend weight loss outpatient with regular physical activity and healthy dieting. ? ? ? ? ? ?Code Status: Full code ? ?Family Communication: None at bedside ? ?Disposition Plan: Likely will discharge to home after general surgery signed off. ? ? ?Consultants: ?General surgery ?GI. ? ?Procedures: ?None ?Plan lap chole and intraoperative cholangiogram on 11/15/2021. ? ?Antimicrobials: ?Rocephin started on 11/13/2021. ? ?DVT prophylaxis: ?Subcu Lovenox daily ? ?Status is: Inpatient status.  Patient requires at least 2 midnights for further evaluation and treatment of present condition. ? ? ? ? ?Objective: ?Vitals:  ? 11/13/21 1230 11/13/21 2137 11/14/21 0535 11/14/21 1311  ?BP: 123/74 135/82 123/67 129/74  ?Pulse: 66 90 74 86  ?Resp: '18 18 18 18  '$ ?Temp: 98.7 ?F (37.1 ?C)  98.2 ?F (36.8 ?C) 98.7 ?F (37.1 ?C)  ?TempSrc: Oral  Oral Oral  ?SpO2: 99% 95% 95% 99%  ?Weight:      ?Height:      ? ? ?Intake/Output Summary (Last 24 hours) at 11/14/2021 1314 ?Last data filed at 11/14/2021 1000 ?Gross per 24 hour  ?Intake 3148.49 ml  ?Output 1000 ml  ?Net 2148.49 ml  ? ?Filed Weights  ? 11/12/21 0008  ?Weight: 113.4 kg  ? ? ?Exam: ? ?General: 31 y.o. year-old female well developed well nourished in no acute distress.  Alert and oriented x3. ?Cardiovascular: Regular rate and rhythm. ?Respiratory: Clear to auscultation. ?Abdomen: Soft epigastric pain with palpation.  Bowel sounds present..  Nondistended with normal bowel sounds x4 quadrants. ?Musculoskeletal: No lower extremity edema. ?Skin: No ulcerative lesions noted or rashes, ?Psychiatry: Mood is appropriate  for condition. ? ? ?Data Reviewed: ?CBC: ?Recent Labs  ?Lab 11/12/21 ?0128 11/12/21 ?1547 11/13/21 ?0402 11/13/21 ?1426 11/14/21 ?7989  ?WBC 8.3 5.2 6.5 6.6 7.2  ?NEUTROABS 6.3  --   --   --  4.3  ?HGB 12.9 12.5 12.0 12.4 12.0  ?HCT 38.0 37.7 36.7 37.9 37.3  ?MCV 88.4  90.6 92.0 92.0 92.3  ?PLT 338 285 279 284 264  ? ?Basic Metabolic Panel: ?Recent Labs  ?Lab 11/11/21 ?1328 11/12/21 ?0128 11/12/21 ?1547 11/13/21 ?0402 11/13/21 ?1426 11/14/21 ?2119  ?NA 137 139  --  141 139 139  ?K 3.6 3.6  --  4.0 3.6 3.8  ?CL 104 107  --  108 107 109  ?CO2 26 25  --  '29 28 27  '$ ?GLUCOSE 102* 96  --  87 89 72  ?BUN 14 14  --  '14 10 8  '$ ?CREATININE 0.69 0.67 0.75 0.84 0.68 0.75  ?CALCIUM 8.9 9.1  --  8.5* 8.5* 8.6*  ?MG  --   --   --   --   --  2.0  ?PHOS  --   --   --   --   --  3.2  ? ?GFR: ?Estimated Creatinine Clearance: 123.5 mL/min (by C-G formula based on SCr of 0.75 mg/dL). ?Liver Function Tests: ?Recent Labs  ?Lab 11/12/21 ?0128 11/13/21 ?0402 11/13/21 ?1426 11/14/21 ?4174  ?AST 189* 106* 72* 39  ?ALT 116* 130* 115* 87*  ?ALKPHOS 71 80 80 73  ?BILITOT 2.9* 2.8* 1.5* 1.3*  ?PROT 6.9 6.4* 6.7 6.5  ?ALBUMIN 3.8 3.5 3.7 3.4*  ? ?Recent Labs  ?Lab 11/12/21 ?1102 11/13/21 ?0402 11/13/21 ?1426 11/14/21 ?0814 11/14/21 ?0734  ?LIPASE 53* 626* 2,932* 883* 977*  ? ?No results for input(s): AMMONIA in the last 168 hours. ?Coagulation Profile: ?No results for input(s): INR, PROTIME in the last 168 hours. ?Cardiac Enzymes: ?No results for input(s): CKTOTAL, CKMB, CKMBINDEX, TROPONINI in the last 168 hours. ?BNP (last 3 results) ?No results for input(s): PROBNP in the last 8760 hours. ?HbA1C: ?No results for input(s): HGBA1C in the last 72 hours. ?CBG: ?No results for input(s): GLUCAP in the last 168 hours. ?Lipid Profile: ?No results for input(s): CHOL, HDL, LDLCALC, TRIG, CHOLHDL, LDLDIRECT in the last 72 hours. ?Thyroid Function Tests: ?No results for input(s): TSH, T4TOTAL, FREET4, T3FREE, THYROIDAB in the last 72 hours. ?Anemia Panel: ?No results for input(s): VITAMINB12, FOLATE, FERRITIN, TIBC, IRON, RETICCTPCT in the last 72 hours. ?Urine analysis: ?   ?Component Value Date/Time  ? [place] YELLOW 11/11/2021 1722  ? APPEARANCEUR HAZY (A) 11/11/2021 1722  ? LABSPEC 1.020 11/11/2021 1722  ? PHURINE  8.5 (H) 11/11/2021 1722  ? GLUCOSEU NEGATIVE 11/11/2021 1722  ? GLUCOSEU 250 (A) 09/24/2018 1246  ? [place] NEGATIVE 11/11/2021 1722  ? BILIRUBINUR SMALL (A) 11/11/2021 1722  ? BILIRUBINUR negative 08/05/2020 0905  ? [place] NEGATIVE 11/11/2021 1722  ? PROTEINUR 100 (A) 11/11/2021 1722  ? UROBILINOGEN 0.2 08/05/2020 0905  ? UROBILINOGEN 1.0 09/24/2018 1246  ? NITRITE NEGATIVE 11/11/2021 1722  ? LEUKOCYTESUR NEGATIVE 11/11/2021 1722  ? ?Sepsis Labs: ?'@LABRCNTIP'$ (procalcitonin:4,lacticidven:4) ? ?)No results found for this or any previous visit (from the past 240 hour(s)).  ? ? ?Studies: ?No results found. ? ?Scheduled Meds: ? enoxaparin (LOVENOX) injection  40 mg Subcutaneous Q24H  ? ? ?Continuous Infusions: ? cefTRIAXone (ROCEPHIN)  IV 2 g (11/14/21 1134)  ? lactated ringers 125 mL/hr at 11/14/21 0530  ? ? ? LOS: 2 days  ? ? ? ?  Kayleen Memos, MD ?[facility] ?Pager [phone] ? ?If 7PM-7AM, please contact night-coverage [email or web address] ?Password [email or web address] ?11/14/2021, 1:14 PM  ?  ?

## 2021-11-15 ENCOUNTER — Encounter (HOSPITAL_COMMUNITY): Payer: Self-pay | Admitting: Internal Medicine

## 2021-11-15 ENCOUNTER — Inpatient Hospital Stay (HOSPITAL_COMMUNITY): Payer: 59 | Admitting: Anesthesiology

## 2021-11-15 ENCOUNTER — Other Ambulatory Visit: Payer: Self-pay

## 2021-11-15 ENCOUNTER — Inpatient Hospital Stay (HOSPITAL_COMMUNITY): Payer: 59

## 2021-11-15 ENCOUNTER — Encounter (HOSPITAL_COMMUNITY)
Admission: EM | Disposition: A | Discharge status: Home / Self Care | Payer: Self-pay | Source: Home / Self Care | Attending: Internal Medicine

## 2021-11-15 DIAGNOSIS — K851 Biliary acute pancreatitis without necrosis or infection: Secondary | ICD-10-CM

## 2021-11-15 DIAGNOSIS — K8050 Calculus of bile duct without cholangitis or cholecystitis without obstruction: Secondary | ICD-10-CM

## 2021-11-15 DIAGNOSIS — Z6841 Body Mass Index (BMI) 40.0 and over, adult: Secondary | ICD-10-CM

## 2021-11-15 DIAGNOSIS — K831 Obstruction of bile duct: Secondary | ICD-10-CM | POA: Diagnosis not present

## 2021-11-15 HISTORY — PX: INTRAOPERATIVE CHOLANGIOGRAM: SHX5230

## 2021-11-15 HISTORY — PX: CHOLECYSTECTOMY: SHX55

## 2021-11-15 LAB — COMPREHENSIVE METABOLIC PANEL
ALT: 74 U/L — ABNORMAL HIGH (ref 0–44)
AST: 44 U/L — ABNORMAL HIGH (ref 15–41)
Albumin: 3.3 g/dL — ABNORMAL LOW (ref 3.5–5.0)
Alkaline Phosphatase: 135 U/L — ABNORMAL HIGH (ref 38–126)
Anion gap: 6 (ref 5–15)
BUN: 8 mg/dL (ref 6–20)
CO2: 24 mmol/L (ref 22–32)
Calcium: 8.6 mg/dL — ABNORMAL LOW (ref 8.9–10.3)
Chloride: 108 mmol/L (ref 98–111)
Creatinine, Ser: 0.62 mg/dL (ref 0.44–1.00)
GFR, Estimated: >60 mL/min (ref >60)
Glucose, Bld: 83 mg/dL (ref 70–99)
Potassium: 3.6 mmol/L (ref 3.5–5.1)
Sodium: 138 mmol/L (ref 135–145)
Total Bilirubin: 0.7 mg/dL (ref 0.3–1.2)
Total Protein: 6.4 g/dL — ABNORMAL LOW (ref 6.5–8.1)

## 2021-11-15 LAB — CBC
HCT: 36 % (ref 36.0–46.0)
Hemoglobin: 12.1 g/dL (ref 12.0–15.0)
MCH: 30.3 pg (ref 26.0–34.0)
MCHC: 33.6 g/dL (ref 30.0–36.0)
MCV: 90.2 fL (ref 80.0–100.0)
Platelets: 276 10*3/uL (ref 150–400)
RBC: 3.99 MIL/uL (ref 3.87–5.11)
RDW: 12.6 % (ref 11.5–15.5)
WBC: 6.9 10*3/uL (ref 4.0–10.5)
nRBC: 0 % (ref 0.0–0.2)

## 2021-11-15 LAB — LIPASE, BLOOD: Lipase: 277 U/L — ABNORMAL HIGH (ref 11–51)

## 2021-11-15 LAB — PHOSPHORUS: Phosphorus: 3.1 mg/dL (ref 2.5–4.6)

## 2021-11-15 LAB — SURGICAL PCR SCREEN
MRSA, PCR: NEGATIVE
Staphylococcus aureus: NEGATIVE

## 2021-11-15 LAB — MAGNESIUM: Magnesium: 1.9 mg/dL (ref 1.7–2.4)

## 2021-11-15 SURGERY — LAPAROSCOPIC CHOLECYSTECTOMY
Anesthesia: General | Site: Abdomen

## 2021-11-15 MED ORDER — ONDANSETRON HCL 4 MG/2ML IJ SOLN: 4.0000 mg | Freq: Four times a day (QID) | INTRAMUSCULAR | Status: DC | PRN

## 2021-11-15 MED ORDER — FENTANYL CITRATE PF 50 MCG/ML IJ SOSY
25.0000 ug | PREFILLED_SYRINGE | INTRAMUSCULAR | Status: DC | PRN
  Administered 2021-11-15 (×2): 50 ug via INTRAVENOUS

## 2021-11-15 MED ORDER — ACETAMINOPHEN 160 MG/5ML PO SOLN: 1000.0000 mg | Freq: Once | ORAL | Status: DC | PRN

## 2021-11-15 MED ORDER — LIDOCAINE HCL (PF) 2 % IJ SOLN
INTRAMUSCULAR | Status: AC
  Filled 2021-11-15: qty 5

## 2021-11-15 MED ORDER — LACTATED RINGERS IV SOLN: INTRAVENOUS | Status: DC

## 2021-11-15 MED ORDER — LIDOCAINE HCL (CARDIAC) PF 100 MG/5ML IV SOSY
PREFILLED_SYRINGE | INTRAVENOUS | Status: DC | PRN
  Administered 2021-11-15: 100 mg via INTRAVENOUS

## 2021-11-15 MED ORDER — ORAL CARE MOUTH RINSE: 15.0000 mL | Freq: Once | OROMUCOSAL | Status: AC

## 2021-11-15 MED ORDER — MIDAZOLAM HCL 5 MG/5ML IJ SOLN
INTRAMUSCULAR | Status: DC | PRN
Start: 2021-11-15 — End: 2021-11-15
  Administered 2021-11-15: 2 mg via INTRAVENOUS

## 2021-11-15 MED ORDER — HYDROMORPHONE HCL 1 MG/ML IJ SOLN: 0.5000 mg | INTRAMUSCULAR | Status: DC | PRN

## 2021-11-15 MED ORDER — MIDAZOLAM HCL 2 MG/2ML IJ SOLN
INTRAMUSCULAR | Status: AC
  Filled 2021-11-15: qty 2

## 2021-11-15 MED ORDER — OXYCODONE HCL 5 MG PO TABS
5.0000 mg | ORAL_TABLET | ORAL | Status: DC | PRN
  Administered 2021-11-15 – 2021-11-16 (×4): 10 mg via ORAL
  Administered 2021-11-16: 5 mg via ORAL
  Administered 2021-11-16: 10 mg via ORAL
  Filled 2021-11-15: qty 2
  Filled 2021-11-15: qty 1
  Filled 2021-11-15 (×4): qty 2

## 2021-11-15 MED ORDER — SUGAMMADEX SODIUM 200 MG/2ML IV SOLN
INTRAVENOUS | Status: DC | PRN
  Administered 2021-11-15: 200 mg via INTRAVENOUS

## 2021-11-15 MED ORDER — PROPOFOL 10 MG/ML IV BOLUS
INTRAVENOUS | Status: AC
  Filled 2021-11-15: qty 20

## 2021-11-15 MED ORDER — LACTATED RINGERS IR SOLN
Status: DC | PRN
  Administered 2021-11-15: 1000 mL

## 2021-11-15 MED ORDER — ONDANSETRON HCL 4 MG/2ML IJ SOLN
INTRAMUSCULAR | Status: AC
  Filled 2021-11-15: qty 2

## 2021-11-15 MED ORDER — OXYCODONE HCL 5 MG PO TABS: 5.0000 mg | ORAL_TABLET | Freq: Once | ORAL | Status: DC | PRN

## 2021-11-15 MED ORDER — DEXTROSE-NACL 5-0.9 % IV SOLN: INTRAVENOUS | Status: DC

## 2021-11-15 MED ORDER — BUPIVACAINE-EPINEPHRINE (PF) 0.25% -1:200000 IJ SOLN
INTRAMUSCULAR | Status: DC | PRN
  Administered 2021-11-15: 18 mL

## 2021-11-15 MED ORDER — FENTANYL CITRATE (PF) 100 MCG/2ML IJ SOLN
INTRAMUSCULAR | Status: DC | PRN
  Administered 2021-11-15 (×4): 50 ug via INTRAVENOUS

## 2021-11-15 MED ORDER — ACETAMINOPHEN 10 MG/ML IV SOLN: 1000.0000 mg | Freq: Once | INTRAVENOUS | Status: DC | PRN

## 2021-11-15 MED ORDER — FENTANYL CITRATE (PF) 100 MCG/2ML IJ SOLN
INTRAMUSCULAR | Status: AC
  Filled 2021-11-15: qty 2

## 2021-11-15 MED ORDER — ONDANSETRON HCL 4 MG/2ML IJ SOLN
INTRAMUSCULAR | Status: DC | PRN
  Administered 2021-11-15: 4 mg via INTRAVENOUS

## 2021-11-15 MED ORDER — BUPIVACAINE-EPINEPHRINE (PF) 0.25% -1:200000 IJ SOLN
INTRAMUSCULAR | Status: AC
  Filled 2021-11-15: qty 30

## 2021-11-15 MED ORDER — ONDANSETRON 4 MG PO TBDP
4.0000 mg | ORAL_TABLET | Freq: Four times a day (QID) | ORAL | Status: DC | PRN
  Administered 2021-11-15: 4 mg via ORAL
  Filled 2021-11-15: qty 1

## 2021-11-15 MED ORDER — POLYETHYLENE GLYCOL 3350 17 G PO PACK
17.0000 g | PACK | Freq: Every day | ORAL | Status: DC
  Administered 2021-11-15 – 2021-11-16 (×2): 17 g via ORAL
  Filled 2021-11-15 (×2): qty 1

## 2021-11-15 MED ORDER — ACETAMINOPHEN 500 MG PO TABS: 1000.0000 mg | ORAL_TABLET | Freq: Once | ORAL | Status: DC | PRN

## 2021-11-15 MED ORDER — OXYCODONE HCL 5 MG/5ML PO SOLN: 5.0000 mg | Freq: Once | ORAL | Status: DC | PRN

## 2021-11-15 MED ORDER — FENTANYL CITRATE PF 50 MCG/ML IJ SOSY
PREFILLED_SYRINGE | INTRAMUSCULAR | Status: AC
Start: 2021-11-15 — End: 2021-11-16
  Filled 2021-11-15: qty 2

## 2021-11-15 MED ORDER — PROPOFOL 10 MG/ML IV BOLUS
INTRAVENOUS | Status: DC | PRN
  Administered 2021-11-15: 130 mg via INTRAVENOUS

## 2021-11-15 MED ORDER — DEXAMETHASONE SODIUM PHOSPHATE 10 MG/ML IJ SOLN
INTRAMUSCULAR | Status: DC | PRN
  Administered 2021-11-15: 10 mg via INTRAVENOUS

## 2021-11-15 MED ORDER — ROCURONIUM BROMIDE 10 MG/ML (PF) SYRINGE
PREFILLED_SYRINGE | INTRAVENOUS | Status: AC
  Filled 2021-11-15: qty 10

## 2021-11-15 MED ORDER — FENTANYL CITRATE (PF) 100 MCG/2ML IJ SOLN
INTRAMUSCULAR | Status: AC
Start: 2021-11-15 — End: ?
  Filled 2021-11-15: qty 2

## 2021-11-15 MED ORDER — DEXAMETHASONE SODIUM PHOSPHATE 10 MG/ML IJ SOLN
INTRAMUSCULAR | Status: AC
  Filled 2021-11-15: qty 1

## 2021-11-15 MED ORDER — 0.9 % SODIUM CHLORIDE (POUR BTL) OPTIME
TOPICAL | Status: DC | PRN
  Administered 2021-11-15: 1000 mL

## 2021-11-15 MED ORDER — CHLORHEXIDINE GLUCONATE 0.12 % MT SOLN
15.0000 mL | Freq: Once | OROMUCOSAL | Status: AC
  Administered 2021-11-15: 15 mL via OROMUCOSAL

## 2021-11-15 MED ORDER — IOHEXOL 300 MG/ML  SOLN
INTRAMUSCULAR | Status: DC | PRN
  Administered 2021-11-15: 7 mL

## 2021-11-15 MED ORDER — ROCURONIUM BROMIDE 10 MG/ML (PF) SYRINGE
PREFILLED_SYRINGE | INTRAVENOUS | Status: DC | PRN
Start: 2021-11-15 — End: 2021-11-15
  Administered 2021-11-15: 50 mg via INTRAVENOUS

## 2021-11-15 SURGICAL SUPPLY — 37 items
APPLIER CLIP 5 13 M/L LIGAMAX5 (MISCELLANEOUS) ×2
BAG COUNTER SPONGE SURGICOUNT (BAG) IMPLANT
CABLE HIGH FREQUENCY MONO STRZ (ELECTRODE) ×2 IMPLANT
CHLORAPREP W/TINT 26 (MISCELLANEOUS) ×2 IMPLANT
CLIP APPLIE 5 13 M/L LIGAMAX5 (MISCELLANEOUS) IMPLANT
CLIP LIGATING HEMO O LOK GREEN (MISCELLANEOUS) ×2 IMPLANT
COVER MAYO STAND XLG (MISCELLANEOUS) ×2 IMPLANT
COVER TRANSDUCER ULTRASND (DRAPES) ×1 IMPLANT
DERMABOND ADVANCED (GAUZE/BANDAGES/DRESSINGS) ×1
DERMABOND ADVANCED .7 DNX12 (GAUZE/BANDAGES/DRESSINGS) ×1 IMPLANT
DRAPE C-ARM 42X120 X-RAY (DRAPES) ×2 IMPLANT
ELECT REM PT RETURN 15FT ADLT (MISCELLANEOUS) ×2 IMPLANT
GAUZE SPONGE 2X2 8PLY STRL LF (GAUZE/BANDAGES/DRESSINGS) ×1 IMPLANT
GLOVE BIO SURGEON STRL SZ7.5 (GLOVE) ×2 IMPLANT
GOWN STRL REUS W/ TWL XL LVL3 (GOWN DISPOSABLE) ×3 IMPLANT
GOWN STRL REUS W/TWL XL LVL3 (GOWN DISPOSABLE) ×3
GRASPER SUT TROCAR 14GX15 (MISCELLANEOUS) ×1 IMPLANT
IRRIG SUCT STRYKERFLOW 2 WTIP (MISCELLANEOUS) ×2
IRRIGATION SUCT STRKRFLW 2 WTP (MISCELLANEOUS) ×1 IMPLANT
KIT BASIN OR (CUSTOM PROCEDURE TRAY) ×2 IMPLANT
KIT TURNOVER KIT A (KITS) ×1 IMPLANT
NDL INSUFFLATION 14GA 120MM (NEEDLE) ×1 IMPLANT
NEEDLE INSUFFLATION 14GA 120MM (NEEDLE) ×2 IMPLANT
PENCIL SMOKE EVACUATOR (MISCELLANEOUS) IMPLANT
POUCH RETRIEVAL ECOSAC 10 (ENDOMECHANICALS) IMPLANT
POUCH RETRIEVAL ECOSAC 10MM (ENDOMECHANICALS) ×1
SCISSORS LAP 5X35 DISP (ENDOMECHANICALS) ×2 IMPLANT
SET CHOLANGIOGRAPH MIX (MISCELLANEOUS) ×2 IMPLANT
SET TUBE SMOKE EVAC HIGH FLOW (TUBING) ×2 IMPLANT
SLEEVE XCEL OPT CAN 5 100 (ENDOMECHANICALS) ×2 IMPLANT
SPIKE FLUID TRANSFER (MISCELLANEOUS) ×1 IMPLANT
SPONGE GAUZE 2X2 STER 10/PKG (GAUZE/BANDAGES/DRESSINGS) ×1
SUT MNCRL AB 4-0 PS2 18 (SUTURE) ×2 IMPLANT
TOWEL OR 17X26 10 PK STRL BLUE (TOWEL DISPOSABLE) ×2 IMPLANT
TRAY LAPAROSCOPIC (CUSTOM PROCEDURE TRAY) ×2 IMPLANT
TROCAR BLADELESS OPT 5 100 (ENDOMECHANICALS) ×2 IMPLANT
TROCAR XCEL NON-BLD 11X100MML (ENDOMECHANICALS) ×2 IMPLANT

## 2021-11-15 NOTE — Anesthesia Preprocedure Evaluation (Signed)
Anesthesia Evaluation  ?Patient identified by MRN, date of birth, ID band ?Patient awake ? ? ? ?Reviewed: ?Allergy & Precautions, NPO status , Patient's Chart, lab work & pertinent test results ? ?History of Anesthesia Complications ?Negative for: history of anesthetic complications ? ?Airway ?Mallampati: I ? ?TM Distance: >3 FB ?Neck ROM: Full ? ? ? Dental ? ?(+) Teeth Intact, Dental Advisory Given ?  ?Pulmonary ?asthma ,  ?  ?breath sounds clear to auscultation ? ? ? ? ? ? Cardiovascular ?negative cardio ROS ? ? ?Rhythm:Regular  ? ?  ?Neuro/Psych ? Headaches,   ? GI/Hepatic ?Neg liver ROS, GERD  ,  ?Endo/Other  ?Morbid obesity ? Renal/GU ?negative Renal ROS  ? ?  ?Musculoskeletal ?negative musculoskeletal ROS ?(+)  ? Abdominal ?  ?Peds ? Hematology ?Lab Results ?     Component                Value               Date                 ?     WBC                      6.9                 11/15/2021           ?     HGB                      12.1                11/15/2021           ?     HCT                      36.0                11/15/2021           ?     MCV                      90.2                11/15/2021           ?     PLT                      276                 11/15/2021           ?   ?Anesthesia Other Findings ? ? Reproductive/Obstetrics ?Lab Results ?     Component                Value               Date                 ?     PREGTESTUR               NEGATIVE            11/11/2021           ?     PREGSERUM                NEGATIVE  10/14/2019           ?     HCGQUANT                 8,284               01/30/2020           ? ? ?  ? ? ? ? ? ? ? ? ? ? ? ? ? ?  ?  ? ? ? ? ? ? ? ? ?Anesthesia Physical ?Anesthesia Plan ? ?ASA: 3 ? ?Anesthesia Plan: General  ? ?Post-op Pain Management: Toradol IV (intra-op)*  ? ?Induction: Intravenous ? ?PONV Risk Score and Plan: 3 and Ondansetron and Dexamethasone ? ?Airway Management Planned: Oral ETT ? ?Additional Equipment:  None ? ?Intra-op Plan:  ? ?Post-operative Plan: Extubation in OR ? ?Informed Consent: I have reviewed the patients History and Physical, chart, labs and discussed the procedure including the risks, benefits and alternatives for the proposed anesthesia with the patient or authorized representative who has indicated his/her understanding and acceptance.  ? ? ? ?Dental advisory given ? ?Plan Discussed with: CRNA ? ?Anesthesia Plan Comments:   ? ? ? ? ? ? ?Anesthesia Quick Evaluation ? ?

## 2021-11-15 NOTE — Transfer of Care (Signed)
Immediate Anesthesia Transfer of Care Note ? ?Patient: Danielle Garcia ? ?Procedure(s) Performed: LAPAROSCOPIC CHOLECYSTECTOMY (Abdomen) ?INTRAOPERATIVE CHOLANGIOGRAM (Abdomen) ? ?Patient Location: PACU ? ?Anesthesia Type:General ? ?Level of Consciousness: sedated ? ?Airway & Oxygen Therapy: Patient Spontanous Breathing and Patient connected to face mask oxygen ? ?Post-op Assessment: Report given to RN and Post -op Vital signs reviewed and stable ? ?Post vital signs: Reviewed and stable ? ?Last Vitals:  ?Vitals Value Taken Time  ?BP 142/96 11/15/21 1157  ?Temp    ?Pulse 115 11/15/21 1157  ?Resp 12 11/15/21 1157  ?SpO2 99 % 11/15/21 1157  ?Vitals shown include unvalidated device data. ? ?Last Pain:  ?Vitals:  ? 11/15/21 1005  ?TempSrc:   ?PainSc: 0-No pain  ?   ? ?Patients Stated Pain Goal: 2 (11/15/21 1005) ? ?Complications: No notable events documented. ?

## 2021-11-15 NOTE — Discharge Instructions (Signed)
[facility]  Please arrive at least 30 min before your appointment to complete your check in paperwork.  If you are unable to arrive 30 min prior to your appointment time we may have to cancel or reschedule you. LAPAROSCOPIC SURGERY: POST OP INSTRUCTIONS Always review your discharge instruction sheet given to you by the facility where your surgery was performed. IF YOU HAVE DISABILITY OR FAMILY LEAVE FORMS, YOU MUST BRING THEM TO THE OFFICE FOR PROCESSING.   DO NOT GIVE THEM TO YOUR DOCTOR.  PAIN CONTROL  First take acetaminophen (Tylenol) AND/or ibuprofen (Advil) to control your pain after surgery.  Follow directions on package.  Taking acetaminophen (Tylenol) and/or ibuprofen (Advil) regularly after surgery will help to control your pain and lower the amount of prescription pain medication you may need.  You should not take more than 4,000 mg (4 grams) of acetaminophen (Tylenol) in 24 hours.  You should not take ibuprofen (Advil), aleve, motrin, naprosyn or other NSAIDS if you have a history of stomach ulcers or chronic kidney disease.  A prescription for pain medication may be given to you upon discharge.  Take your pain medication as prescribed, if you still have uncontrolled pain after taking acetaminophen (Tylenol) or ibuprofen (Advil). Use ice packs to help control pain. If you need a refill on your pain medication, please contact your pharmacy.  They will contact our office to request authorization. Prescriptions will not be filled after 5pm or on week-ends.  HOME MEDICATIONS Take your usually prescribed medications unless otherwise directed.  DIET You should follow a light diet the first few days after arrival home.  Be sure to include lots of fluids daily. Avoid fatty, fried foods.   CONSTIPATION It is common to experience some constipation after surgery and if you are taking pain medication.  Increasing fluid intake and taking a stool softener (such as Colace)  will usually help or prevent this problem from occurring.  A mild laxative (Milk of Magnesia or Miralax) should be taken according to package instructions if there are no bowel movements after 48 hours.  WOUND/INCISION CARE Most patients will experience some swelling and bruising in the area of the incisions.  Ice packs will help.  Swelling and bruising can take several days to resolve.  Unless discharge instructions indicate otherwise, follow guidelines below  STERI-STRIPS - you may remove your outer bandages 48 hours after surgery, and you may shower at that time.  You have steri-strips (small skin tapes) in place directly over the incision.  These strips should be left on the skin for 7-10 days.   DERMABOND/SKIN GLUE - you may shower in 24 hours.  The glue will flake off over the next 2-3 weeks. Any sutures or staples will be removed at the office during your follow-up visit.  ACTIVITIES You may resume regular (light) daily activities beginning the next day--such as daily self-care, walking, climbing stairs--gradually increasing activities as tolerated.  You may have sexual intercourse when it is comfortable.  Refrain from any heavy lifting or straining until approved by your doctor. You may drive when you are no longer taking prescription pain medication, you can comfortably wear a seatbelt, and you can safely maneuver your car and apply brakes.  FOLLOW-UP You should see your doctor in the office for a follow-up appointment approximately 2-3 weeks after your surgery.  You should have been given your post-op/follow-up appointment when your surgery was scheduled.  If you did not receive a post-op/follow-up appointment, make sure   that you call for this appointment within a day or two after you arrive home to insure a convenient appointment time.   WHEN TO CALL YOUR DOCTOR: Fever over 101.0 Inability to urinate Continued bleeding from incision. Increased pain, redness, or drainage from the  incision. Increasing abdominal pain  The clinic staff is available to answer your questions during regular business hours.  Please don't hesitate to call and ask to speak to one of the nurses for clinical concerns.  If you have a medical emergency, go to the nearest emergency room or call 911.  A surgeon from [facility] is always on call at the hospital. [address], [address], [place], [place]  [address] ? [address] [address], [place], [place]   [address] [phone] ? [phone] ? FAX [phone]  

## 2021-11-15 NOTE — Progress Notes (Signed)
Day of Surgery  ? ?Subjective/Chief Complaint: ?Plan for surgery today ?Pain better ? ? ?Objective: ?Vital signs in last 24 hours: ?Temp:  [97.8 ?F (36.6 ?C)-98.7 ?F (37.1 ?C)] 98.1 ?F (36.7 ?C) (05/02 7948) ?Pulse Rate:  [79-86] 79 (05/02 0618) ?Resp:  [18-20] 20 (05/02 0618) ?BP: (117-154)/(74-92) 154/92 (05/02 0618) ?SpO2:  [97 %-100 %] 100 % (05/02 0618) ?Last BM Date : 11/11/21 ? ?Intake/Output from previous day: ?05/01 0701 - 05/02 0700 ?In: 2433.8 [P.O.:480; I.V.:1853.8; IV Piggyback:100] ?Out: 850 [Urine:850] ?Intake/Output this shift: ?No intake/output data recorded. ? ?PE: ? ?Constitutional: No acute distress, conversant, appears states age. ?Eyes: Anicteric sclerae, moist conjunctiva, no lid lag ?Lungs: Clear to auscultation bilaterally, normal respiratory effort ?CV: regular rate and rhythm, no murmurs, no peripheral edema, pedal pulses 2+ ?GI: Soft, no masses or hepatosplenomegaly, non-tender to palpation ?Skin: No rashes, palpation reveals normal turgor ?Psychiatric: appropriate judgment and insight, oriented to person, place, and time ? ? ?Lab Results:  ?Recent Labs  ?  11/14/21 ?0165 11/15/21 ?0436  ?WBC 7.2 6.9  ?HGB 12.0 12.1  ?HCT 37.3 36.0  ?PLT 264 276  ? ?BMET ?Recent Labs  ?  11/14/21 ?5374 11/15/21 ?0436  ?NA 139 138  ?K 3.8 3.6  ?CL 109 108  ?CO2 27 24  ?GLUCOSE 72 83  ?BUN 8 8  ?CREATININE 0.75 0.62  ?CALCIUM 8.6* 8.6*  ? ?PT/INR ?No results for input(s): LABPROT, INR in the last 72 hours. ?ABG ?No results for input(s): PHART, HCO3 in the last 72 hours. ? ?Invalid input(s): PCO2, PO2 ? ?Studies/Results: ?No results found. ? ?Anti-infectives: ?Anti-infectives (From admission, onward)  ? ? Start     Dose/Rate Route Frequency Ordered Stop  ? 11/13/21 1130  cefTRIAXone (ROCEPHIN) 2 g in sodium chloride 0.9 % 100 mL IVPB       ? 2 g ?200 mL/hr over 30 Minutes Intravenous Every 24 hours 11/13/21 1046    ? ?  ? ? ?Assessment/Plan: ?15F with biliary pancreatitis ?-To OR for lap chole with IOC  today ?All risks and benefits were discussed with the patient to generally include: infection, bleeding, possible need for post op ERCP, damage to the bile ducts, and bile leak. Alternatives were offered and described.  All questions were answered and the patient voiced understanding of the procedure and wishes to proceed at this point with a laparoscopic cholecystectomy ? ? ? LOS: 3 days  ? ? ?Danielle Garcia ?11/15/2021 ? ?

## 2021-11-15 NOTE — Progress Notes (Signed)
? ?PROGRESS NOTE ? ?Jahzaria Vary UMP:[id number] DOB: 14-Aug-1990 DOA: 11/12/2021 ?PCP: [name], DO ? ?HPI/Recap of past 24 hours: ?Astaria Nanez is a 31 y.o. female with medical history significant of morbid obesity. Presenting for the second time consecutively to [facility] ED with epigastric pain radiating through to her back.  Work-up in the ED revealed gallstones seen on abdominal ultrasound, common bile duct dilatation measuring 8.6 mm, elevated liver chemistries, total bilirubin of 2.9, mildly elevated lipase level.  She underwent MRCP, no signs of choledocholithiasis.  Repeated lipase level up trended, up to 2900 from 53 and then started trending down.  General surgery was consulted.  Seen by Dr. Georgette Dover.  Plan for lap chole on 11/15/2021 by Dr. Rosendo Gros.  ? ?11/14/2021: Patient was seen and examined at bedside.  Her pain is mainly at the epigastric region.  No nausea at this time.  No right upper quadrant pain.  Her liver chemistries levels are downtrending. ? ?5/2: Patient was waiting for laparoscopic cholecystectomy later today during morning rounds.  Abdominal pain seems improving.  Later underwent successful laparoscopic cholecystectomy for biliary pancreatitis and cholelithiasis.  Tolerated the procedure well. ?If remains stable, most likely can be discharged tomorrow. ? ?Subjective.  Patient was seen and examined today.  No nausea or vomiting, having some mild headache.  No abdominal pain.  She was waiting for her surgery.  Denies any regular alcohol intake. ? ?Assessment/Plan: ?Principal Problem: ?  Biliary obstruction ?Active Problems: ?  Morbid obesity ([place]) ?  Hyperbilirubinemia ?  Elevated LFTs ?  Abdominal pain ? ?Acute cholecystitis as evidenced by gallstones, mild pericholecystic fluid seen on abdominal ultrasound. ?Started on Rocephin 2 g daily on 4/30, continue ?Underwent laparoscopic cholecystectomy for biliary pancreatitis and cholelithiasis today with general surgery. ?Continue pain  management with bowel regimen ? ?Acute gallstones pancreatitis ?Presented with epigastric pain radiating to her back ?Initial lipase level 53 on admission, up trended to 626 then 2900 on 11/13/2021, and not trending down, at 277 today. ?Continue IV fluid hydration,  ?Continue with supportive care ? ?Improving elevated Liver chemistries with concern for biliary obstruction ?No choledocholithiasis seen on MRCP ?Trend LFTs ?Repeat CMP in the morning ? ?Hyperbilirubinemia ?Total bilirubin peaked at 2.8, downtrending to 1.3>>0.7 ? ?Severe obesity ?Estimated body mass index is 44.29 kg/m? as calculated from the following: ?  Height as of this encounter: '5\' 3"'$  (1.6 m). ?  Weight as of this encounter: 113.4 kg.  ?This will complicate overall prognosis ?Recommend weight loss outpatient with regular physical activity and healthy dieting. ? ?Code Status: Full code ? ?Family Communication: Discussed with patient ? ?Disposition Plan: Likely will discharge to home after general surgery signed off. ? ? ?Consultants: ?General surgery ?GI. ? ?Procedures: ?Laparoscopic cholecystectomy on 11/15/2021 ? ?Antimicrobials: ?Rocephin started on 11/13/2021. ? ?DVT prophylaxis: ?Subcu Lovenox daily ? ?Status is: Inpatient status.  Patient requires at least 2 midnights for further evaluation and treatment of present condition. ? ? ?Objective: ?Vitals:  ? 11/15/21 1252 11/15/21 1404 11/15/21 1501 11/15/21 1610  ?BP: (!) 156/96 (!) 168/125 (!) 163/105   ?Pulse: 93 92 98 88  ?Resp: '18 20 18 18  '$ ?Temp: (!) 97.3 ?F (36.3 ?C) 98.5 ?F (36.9 ?C) 97.8 ?F (36.6 ?C) 98 ?F (36.7 ?C)  ?TempSrc: Oral Oral Oral Oral  ?SpO2: 94% 97% 99%   ?Weight:      ?Height:      ? ? ?Intake/Output Summary (Last 24 hours) at 11/15/2021 1622 ?Last data filed at 11/15/2021 1400 ?Johney Maine  per 24 hour  ?Intake 1906.7 ml  ?Output 1450 ml  ?Net 456.7 ml  ? ? ?Filed Weights  ? 11/12/21 0008  ?Weight: 113.4 kg  ? ? ?Exam: ? ?General.  Obese lady, in no acute distress. ?Pulmonary.  Lungs  clear bilaterally, normal respiratory effort. ?CV.  Regular rate and rhythm, no JVD, rub or murmur. ?Abdomen.  Soft, nontender, nondistended, BS positive. ?CNS.  Alert and oriented x3.  No focal neurologic deficit. ?Extremities.  No edema, no cyanosis, pulses intact and symmetrical. ?Psychiatry.  Judgment and insight appears normal.  ? ?Data Reviewed: ?CBC: ?Recent Labs  ?Lab 11/12/21 ?0128 11/12/21 ?1547 11/13/21 ?0402 11/13/21 ?1426 11/14/21 ?3474 11/15/21 ?0436  ?WBC 8.3 5.2 6.5 6.6 7.2 6.9  ?NEUTROABS 6.3  --   --   --  4.3  --   ?HGB 12.9 12.5 12.0 12.4 12.0 12.1  ?HCT 38.0 37.7 36.7 37.9 37.3 36.0  ?MCV 88.4 90.6 92.0 92.0 92.3 90.2  ?PLT 338 285 279 284 264 276  ? ? ?Basic Metabolic Panel: ?Recent Labs  ?Lab 11/12/21 ?0128 11/12/21 ?1547 11/13/21 ?0402 11/13/21 ?1426 11/14/21 ?2595 11/15/21 ?0436  ?NA 139  --  141 139 139 138  ?K 3.6  --  4.0 3.6 3.8 3.6  ?CL 107  --  108 107 109 108  ?CO2 25  --  '29 28 27 24  '$ ?GLUCOSE 96  --  87 89 72 83  ?BUN 14  --  '14 10 8 8  '$ ?CREATININE 0.67 0.75 0.84 0.68 0.75 0.62  ?CALCIUM 9.1  --  8.5* 8.5* 8.6* 8.6*  ?MG  --   --   --   --  2.0 1.9  ?PHOS  --   --   --   --  3.2 3.1  ? ? ?GFR: ?Estimated Creatinine Clearance: 123.5 mL/min (by C-G formula based on SCr of 0.62 mg/dL). ?Liver Function Tests: ?Recent Labs  ?Lab 11/12/21 ?0128 11/13/21 ?0402 11/13/21 ?1426 11/14/21 ?6387 11/15/21 ?0436  ?AST 189* 106* 72* 39 44*  ?ALT 116* 130* 115* 87* 74*  ?ALKPHOS 71 80 80 73 135*  ?BILITOT 2.9* 2.8* 1.5* 1.3* 0.7  ?PROT 6.9 6.4* 6.7 6.5 6.4*  ?ALBUMIN 3.8 3.5 3.7 3.4* 3.3*  ? ? ?Recent Labs  ?Lab 11/13/21 ?0402 11/13/21 ?1426 11/14/21 ?5643 11/14/21 ?3295 11/15/21 ?0436  ?LIPASE 626* 2,932* 883* 977* 277*  ? ? ?No results for input(s): AMMONIA in the last 168 hours. ?Coagulation Profile: ?No results for input(s): INR, PROTIME in the last 168 hours. ?Cardiac Enzymes: ?No results for input(s): CKTOTAL, CKMB, CKMBINDEX, TROPONINI in the last 168 hours. ?BNP (last 3 results) ?No results  for input(s): PROBNP in the last 8760 hours. ?HbA1C: ?No results for input(s): HGBA1C in the last 72 hours. ?CBG: ?No results for input(s): GLUCAP in the last 168 hours. ?Lipid Profile: ?No results for input(s): CHOL, HDL, LDLCALC, TRIG, CHOLHDL, LDLDIRECT in the last 72 hours. ?Thyroid Function Tests: ?No results for input(s): TSH, T4TOTAL, FREET4, T3FREE, THYROIDAB in the last 72 hours. ?Anemia Panel: ?No results for input(s): VITAMINB12, FOLATE, FERRITIN, TIBC, IRON, RETICCTPCT in the last 72 hours. ?Urine analysis: ?   ?Component Value Date/Time  ? [place] YELLOW 11/11/2021 1722  ? APPEARANCEUR HAZY (A) 11/11/2021 1722  ? LABSPEC 1.020 11/11/2021 1722  ? PHURINE 8.5 (H) 11/11/2021 1722  ? GLUCOSEU NEGATIVE 11/11/2021 1722  ? GLUCOSEU 250 (A) 09/24/2018 1246  ? [place] NEGATIVE 11/11/2021 1722  ? BILIRUBINUR SMALL (A) 11/11/2021 1722  ? BILIRUBINUR negative 08/05/2020  [place] NEGATIVE 11/11/2021 1722  ? PROTEINUR 100 (A) 11/11/2021 1722  ? UROBILINOGEN 0.2 08/05/2020 0905  ? UROBILINOGEN 1.0 09/24/2018 1246  ? NITRITE NEGATIVE 11/11/2021 1722  ? LEUKOCYTESUR NEGATIVE 11/11/2021 1722  ? ?Sepsis Labs: ?'@LABRCNTIP'$ (procalcitonin:4,lacticidven:4) ? ?) ?Recent Results (from the past 240 hour(s))  ?Surgical pcr screen     Status: None  ? Collection Time: 11/14/21 10:46 PM  ? Specimen: Nasal Mucosa; Nasal Swab  ?Result Value Ref Range Status  ? MRSA, PCR NEGATIVE NEGATIVE Final  ? Staphylococcus aureus NEGATIVE NEGATIVE Final  ?  Comment: (NOTE) ?The Xpert SA Assay (FDA approved for NASAL specimens in patients 52 ?years of age and older), is one component of a comprehensive ?surveillance program. It is not intended to diagnose infection nor to ?guide or monitor treatment. ?Performed at [facility], [address] ?[place], [place] [address] ?  ?  ? ? ?Studies: ?DG Cholangiogram Operative ? ?Result Date: 11/15/2021 ?CLINICAL DATA:  Intraoperative cholangiogram Right upper quadrant abdominal  pain EXAM: INTRAOPERATIVE CHOLANGIOGRAM TECHNIQUE: Cholangiographic images from the C-arm fluoroscopic device were submitted for interpretation post-operatively. Please see the procedural report for the am

## 2021-11-15 NOTE — Anesthesia Procedure Notes (Signed)
Procedure Name: Intubation ?Date/Time: 11/15/2021 10:53 AM ?Performed by: Lind Covert, CRNA ?Pre-anesthesia Checklist: Patient identified, Emergency Drugs available, Suction available, Patient being monitored and Timeout performed ?Patient Re-evaluated:Patient Re-evaluated prior to induction ?Oxygen Delivery Method: Circle system utilized ?Preoxygenation: Pre-oxygenation with 100% oxygen ?Induction Type: IV induction ?Ventilation: Mask ventilation without difficulty ?Laryngoscope Size: Mac and 4 ?Grade View: Grade I ?Tube type: Oral ?Tube size: 7.0 mm ?Number of attempts: 1 ?Airway Equipment and Method: Stylet ?Placement Confirmation: ETT inserted through vocal cords under direct vision, positive ETCO2 and breath sounds checked- equal and bilateral ?Secured at: 22 cm ?Tube secured with: Tape ?Dental Injury: Teeth and Oropharynx as per pre-operative assessment  ? ? ? ? ?

## 2021-11-15 NOTE — Op Note (Signed)
11/15/2021 ? ?11:40 AM ? ?PATIENT:  Danielle Garcia  31 y.o. female ? ?PRE-OPERATIVE DIAGNOSIS:  BILIARY PANCREATITIS ? ?POST-OPERATIVE DIAGNOSIS:  BILIARY PANCREATITIS, CHOLELITHIASIS ? ?PROCEDURE:  Procedure(s) with comments: ?LAPAROSCOPIC CHOLECYSTECTOMY (N/A) - 90 MINS NEEDED ROOM 4 PATIENT IN BED 1302 ?INTRAOPERATIVE CHOLANGIOGRAM (N/A) ? ?SURGEON:  Surgeon(s) and Role: ?   Ralene Ok, MD - Primary ? ?PHYSICIAN ASSISTANT: Barkley Boards, PA-C ? ? ?ANESTHESIA:   local and general ? ?EBL:  minimal  ? ?BLOOD ADMINISTERED:none ? ?DRAINS: none  ? ?LOCAL MEDICATIONS USED:  BUPIVICAINE  ? ?SPECIMEN:  Source of Specimen:  gallbladder ? ?DISPOSITION OF SPECIMEN:  PATHOLOGY ? ?COUNTS:  YES ? ?TOURNIQUET:  * No tourniquets in log * ? ?DICTATION: .Dragon Dictation ? ?The patient was taken to the operating and placed in the supine position with bilateral SCDs in place.  The patient was prepped and draped in the usual sterile fashion. A time out was called and all facts were verified. A pneumoperitoneum was obtained via A Veress needle technique to a pressure of 22m of mercury. ? ?A 532mtrochar was then placed in the right upper quadrant under visualization, and there were no injuries to any abdominal organs. A 11 mm port was then placed in the umbilical region after infiltrating with local anesthesia under direct visualization. A second and third epigastric port and right lower quadrant port placement under direct visualization, respectively.    ? ?The gallbladder was identified and retracted, the peritoneum was then sharply dissected from the gallbladder and this dissection was carried down to Calot's triangle. The cystic duct was identified and stripped away circumferentially and seen going into the gallbladder 360?, the critical angle was obtained. A Cook catheter was used to perform an intraoperative cholangiogram. The cholangiogram showed no filling defects and the contrast emptied into the duodenum easily.  The  hepatic ducts were seen to be free of any filling defects. 2 clips were placed proximally one distally and the cystic duct transected. The cystic artery was identified and 2 clips placed proximally and one distally and transected.   ? ?We then proceeded to remove the gallbladder off the hepatic fossa with Bovie cautery. A retrieval bag was then placed in the abdomen and gallbladder placed in the bag. The hepatic fossa was then reexamined and hemostasis was achieved with Bovie cautery and was excellent at the end of the case. The subhepatic fossa and perihepatic fossa was then irrigated until the effluent was clear. The 11 mm trocar fascia was reapproximated with the PMI & a #1 Vicryl x2.  The pneumoperitoneum was evacuated and all trochars removed under direct visulalization.  The skin was then closed with 4-0 Monocryl and the skin dressed with Dermabond.  The patient was awaken from general anesthesia and taken to the recovery room in stable condition. ? ? ?PLAN OF CARE: Admit to inpatient  ? ?PATIENT DISPOSITION:  PACU - hemodynamically stable. ?  ?Delay start of Pharmacological VTE agent (>24hrs) due to surgical blood loss or risk of bleeding: not applicable ? ?

## 2021-11-15 NOTE — Anesthesia Postprocedure Evaluation (Signed)
Anesthesia Post Note ? ?Patient: Danielle Garcia ? ?Procedure(s) Performed: LAPAROSCOPIC CHOLECYSTECTOMY (Abdomen) ?INTRAOPERATIVE CHOLANGIOGRAM (Abdomen) ? ?  ? ?Patient location during evaluation: PACU ?Anesthesia Type: General ?Level of consciousness: awake and alert, oriented and patient cooperative ?Pain management: pain level controlled ?Vital Signs Assessment: post-procedure vital signs reviewed and stable ?Respiratory status: spontaneous breathing, nonlabored ventilation and respiratory function stable ?Cardiovascular status: blood pressure returned to baseline and stable ?Postop Assessment: no apparent nausea or vomiting ?Anesthetic complications: no ? ? ?No notable events documented. ? ?Last Vitals:  ?Vitals:  ? 11/15/21 1252 11/15/21 1404  ?BP: (!) 156/96 (!) 168/125  ?Pulse: 93 92  ?Resp: 18 20  ?Temp: (!) 36.3 ?C 36.9 ?C  ?SpO2: 94% 97%  ?  ?Last Pain:  ?Vitals:  ? 11/15/21 1404  ?TempSrc: Oral  ?PainSc:   ? ? ?  ?  ?  ?  ?  ?  ? ?Jarome Matin Anabia Weatherwax ? ? ? ? ?

## 2021-11-16 ENCOUNTER — Encounter (HOSPITAL_COMMUNITY): Payer: Self-pay | Admitting: General Surgery

## 2021-11-16 DIAGNOSIS — R7989 Other specified abnormal findings of blood chemistry: Secondary | ICD-10-CM | POA: Diagnosis not present

## 2021-11-16 DIAGNOSIS — K831 Obstruction of bile duct: Secondary | ICD-10-CM | POA: Diagnosis not present

## 2021-11-16 DIAGNOSIS — R109 Unspecified abdominal pain: Secondary | ICD-10-CM

## 2021-11-16 LAB — CBC
HCT: 39.1 % (ref 36.0–46.0)
Hemoglobin: 12.5 g/dL (ref 12.0–15.0)
MCH: 29.5 pg (ref 26.0–34.0)
MCHC: 32 g/dL (ref 30.0–36.0)
MCV: 92.2 fL (ref 80.0–100.0)
Platelets: 314 10*3/uL (ref 150–400)
RBC: 4.24 MIL/uL (ref 3.87–5.11)
RDW: 12.9 % (ref 11.5–15.5)
WBC: 9.7 10*3/uL (ref 4.0–10.5)
nRBC: 0 % (ref 0.0–0.2)

## 2021-11-16 LAB — COMPREHENSIVE METABOLIC PANEL
ALT: 66 U/L — ABNORMAL HIGH (ref 0–44)
AST: 31 U/L (ref 15–41)
Albumin: 3.9 g/dL (ref 3.5–5.0)
Alkaline Phosphatase: 125 U/L (ref 38–126)
Anion gap: 6 (ref 5–15)
BUN: 5 mg/dL — ABNORMAL LOW (ref 6–20)
CO2: 25 mmol/L (ref 22–32)
Calcium: 9.4 mg/dL (ref 8.9–10.3)
Chloride: 108 mmol/L (ref 98–111)
Creatinine, Ser: 0.61 mg/dL (ref 0.44–1.00)
GFR, Estimated: >60 mL/min (ref >60)
Glucose, Bld: 132 mg/dL — ABNORMAL HIGH (ref 70–99)
Potassium: 4.1 mmol/L (ref 3.5–5.1)
Sodium: 139 mmol/L (ref 135–145)
Total Bilirubin: 0.7 mg/dL (ref 0.3–1.2)
Total Protein: 7.9 g/dL (ref 6.5–8.1)

## 2021-11-16 LAB — SURGICAL PATHOLOGY

## 2021-11-16 MED ORDER — SIMETHICONE 80 MG PO CHEW
80.0000 mg | CHEWABLE_TABLET | Freq: Once | ORAL | Status: AC
  Administered 2021-11-16: 80 mg via ORAL
  Filled 2021-11-16: qty 1

## 2021-11-16 MED ORDER — OXYCODONE HCL 5 MG PO TABS: 5.0000 mg | ORAL_TABLET | ORAL | 0 refills | Status: DC | PRN

## 2021-11-16 MED ORDER — ACETAMINOPHEN 325 MG PO TABS
650.0000 mg | ORAL_TABLET | Freq: Four times a day (QID) | ORAL | Status: DC
  Administered 2021-11-16: 650 mg via ORAL
  Filled 2021-11-16: qty 2

## 2021-11-16 MED ORDER — ACETAMINOPHEN 325 MG PO TABS: 650.0000 mg | ORAL_TABLET | Freq: Four times a day (QID) | ORAL | Status: DC | PRN

## 2021-11-16 NOTE — TOC Transition Note (Signed)
Transition of Care (TOC) - CM/SW Discharge Note ? ? ?Patient Details  ?Name: Danielle Garcia ?MRN: [id number] ?Date of Birth: 1990/09/16 ? ?Transition of Care [facility]) CM/SW Contact:  ?Leeroy Cha, RN ?Phone Number: ?11/16/2021, 1:18 PM ? ? ?Clinical Narrative:    ?Dcd to home with no toc needs ? ? ? ?  ?Barriers to Discharge: Continued Medical Work up ? ? ?Patient Goals and CMS Choice ?  ?  ?  ? ?Discharge Placement ?  ?           ?  ?  ?  ?  ? ?Discharge Plan and Services ?  ?  ?           ?  ?  ?  ?  ?  ?  ?  ?  ?  ?  ? ?Social Determinants of Health (SDOH) Interventions ?  ? ? ?Readmission Risk Interventions ?   ? View : No data to display.  ?  ?  ?  ? ? ? ? ? ?

## 2021-11-16 NOTE — Hospital Course (Signed)
31 y.o. female with medical history significant of morbid obesity. Presenting for the second time consecutively to [facility] ED with epigastric pain radiating through to her back.  Work-up in the ED revealed gallstones seen on abdominal ultrasound, common bile duct dilatation measuring 8.6 mm, elevated liver chemistries, total bilirubin of 2.9, mildly elevated lipase level.  She underwent MRCP, no signs of choledocholithiasis.  Repeated lipase level up trended, up to 2900 from 53 and then started trending down.  General surgery was consulted.  Seen by Dr. Georgette Dover.  Plan for lap chole on 11/15/2021 by Dr. Rosendo Gros. ?

## 2021-11-16 NOTE — Discharge Summary (Signed)
?Physician Discharge Summary ?  ?Patient: Danielle Garcia MRN: [id number] DOB: Dec 02, 1990  ?Admit date:     11/12/2021  ?Discharge date: 11/16/21  ?Discharge Physician: Marylu Lund  ? ?PCP: [name], DO  ? ?Recommendations at discharge:  ? ? Follow up with General Surgery as scheduled ?Follow up with PCP in 1-2 weeks ? ?Discharge Diagnoses: ?Principal Problem: ?  Biliary obstruction ?Active Problems: ?  Morbid obesity ([place]) ?  Hyperbilirubinemia ?  Elevated LFTs ?  Abdominal pain ? ?Resolved Problems: ?  * No resolved hospital problems. * ? ?Hospital Course: ?31 y.o. female with medical history significant of morbid obesity. Presenting for the second time consecutively to [facility] ED with epigastric pain radiating through to her back.  Work-up in the ED revealed gallstones seen on abdominal ultrasound, common bile duct dilatation measuring 8.6 mm, elevated liver chemistries, total bilirubin of 2.9, mildly elevated lipase level.  She underwent MRCP, no signs of choledocholithiasis.  Repeated lipase level up trended, up to 2900 from 53 and then started trending down.  General surgery was consulted.  Seen by Dr. Georgette Dover.  Plan for lap chole on 11/15/2021 by Dr. Rosendo Gros. ? ?Assessment and Plan: ?No notes have been filed under this hospital service. ?Service: Hospitalist ?Acute cholecystitis as evidenced by gallstones, mild pericholecystic fluid seen on abdominal ultrasound. ?Started on Rocephin 2 g daily on 4/30, continue ?Underwent laparoscopic cholecystectomy for biliary pancreatitis and cholelithiasis with general surgery. ?Cleared by General Surgery for discharge ?  ?Acute gallstones pancreatitis ?Presented with epigastric pain radiating to her back ?Initial lipase level 53 on admission, up trended to 626 then 2900 on 11/13/2021, and later trended down to 277 today. ?  ?Improving elevated Liver chemistries with concern for biliary obstruction ?No choledocholithiasis seen on MRCP ?LFT's trended down to near  normal ?  ?Hyperbilirubinemia ?Total bilirubin peaked at 2.8, downtrending to 1.3>>0.7 ?  ?Severe obesity ?Estimated body mass index is 44.29 kg/m? as calculated from the following: ?  Height as of this encounter: '5\' 3"'$  (1.6 m). ?  Weight as of this encounter: 113.4 kg.  ?This will complicate overall prognosis ?Recommend weight loss outpatient with regular physical activity and healthy dieting. ?  ? ?  ? ? ?Consultants: General Surgery ?Procedures performed: Lap Chole 5/2  ?Disposition: Home ?Diet recommendation:  ?Regular diet ?DISCHARGE MEDICATION: ?Allergies as of 11/16/2021   ?No Known Allergies ?  ? ?  ?Medication List  ?  ? ?TAKE these medications   ? ?acetaminophen 325 MG tablet ?Commonly known as: TYLENOL ?Take 2 tablets (650 mg total) by mouth every 6 (six) hours as needed for mild pain or fever. ?  ?famotidine 20 MG tablet ?Commonly known as: PEPCID ?Take 1 tablet (20 mg total) by mouth 2 (two) times daily. ?  ?levocetirizine 5 MG tablet ?Commonly known as: XYZAL ?Take 1 tablet (5 mg total) by mouth every evening. ?What changed:  ?when to take this ?reasons to take this ?  ?Maalox Max 537-482-70 MG/5ML suspension ?Generic drug: alum & mag hydroxide-simeth ?Take 10 mLs by mouth every 6 (six) hours as needed for indigestion. ?  ?meloxicam 15 MG tablet ?Commonly known as: MOBIC ?Take 1 tablet (15 mg total) by mouth daily. ?What changed:  ?when to take this ?reasons to take this ?  ?mometasone 50 MCG/ACT nasal spray ?Commonly known as: Nasonex ?Place 2 sprays into the nose daily. ?What changed:  ?when to take this ?reasons to take this ?  ?ondansetron 4 MG tablet ?Commonly known as: ZOFRAN ?  Take 1 tablet (4 mg total) by mouth every 6 (six) hours. ?What changed:  ?when to take this ?reasons to take this ?  ?oxyCODONE 5 MG immediate release tablet ?Commonly known as: Oxy IR/ROXICODONE ?Take 1 tablet (5 mg total) by mouth every 4 (four) hours as needed for moderate pain. ?  ?Ozempic (1 MG/DOSE) 4 MG/3ML  Sopn ?Generic drug: Semaglutide (1 MG/DOSE) ?Inject 1 mg as directed once a week. ?  ?rizatriptan 5 MG disintegrating tablet ?Commonly known as: MAXALT-MLT ?Take 1 tablet (5 mg total) by mouth as needed for migraine. May repeat in 2 hours if needed ?  ?tiZANidine 4 MG tablet ?Commonly known as: Zanaflex ?Take 1 tablet (4 mg total) by mouth every 6 (six) hours as needed for muscle spasms. ?  ? ?  ? ? Follow-up Information   ? ? Surgery, [place]. Go on 12/06/2021.   ?Specialty: General Surgery ?Why: 10:30 AM. Please bring a copy of your photo ID and insurance card. Please arrive 30 minutes prior to your appointment for paperwork. ?Contact information: ?[address] ?[address] ?[place] [address] ?[phone] ? ? ?  ?  ? ? [name], DO Follow up in 2 week(s).   ?Specialty: Family Medicine ?Why: Hospital follow up ?Contact information: ?[address] ?[address] ?[place] [address] ?[phone] ? ? ?  ?  ? ?  ?  ? ?  ? ?Discharge Exam: ?Danley Danker Weights  ? 11/12/21 0008  ?Weight: 113.4 kg  ? ?General exam: Awake, laying in bed, in nad ?Respiratory system: Normal respiratory effort, no wheezing ?Cardiovascular system: regular rate, s1, s2 ?Gastrointestinal system: Soft, nondistended, positive BS ?Central nervous system: CN2-12 grossly intact, strength intact ?Extremities: Perfused, no clubbing ?Skin: Normal skin turgor, no notable skin lesions seen ?Psychiatry: Mood normal // no visual hallucinations  ? ?Condition at discharge: fair ? ?The results of significant diagnostics from this hospitalization (including imaging, microbiology, ancillary and laboratory) are listed below for reference.  ? ?Imaging Studies: ?DG Chest 2 View ? ?Result Date: 11/11/2021 ?CLINICAL DATA:  Chest pressure with epigastric pain. EXAM: CHEST - 2 VIEW COMPARISON:  09/12/20 FINDINGS: The heart size and mediastinal contours are within normal limits. Both lungs are clear. The visualized skeletal structures are  unremarkable. IMPRESSION: No active cardiopulmonary disease. Electronically Signed   By: Abigail Miyamoto M.D.   On: 11/11/2021 13:39  ? ?DG Cholangiogram Operative ? ?Result Date: 11/15/2021 ?CLINICAL DATA:  Intraoperative cholangiogram Right upper quadrant abdominal pain EXAM: INTRAOPERATIVE CHOLANGIOGRAM TECHNIQUE: Cholangiographic images from the C-arm fluoroscopic device were submitted for interpretation post-operatively. Please see the procedural report for the amount of contrast and the fluoroscopy time utilized. FLUOROSCOPY: Radiation Exposure Index (as provided by the fluoroscopic device): 21 mGy Kerma COMPARISON:  None Available. FINDINGS: Two cine clips are provided for interpretation. The submitted images demonstrate opacification of the common hepatic and bile ducts through cystic duct remnant. No filling defects identified within the common bile duct to indicate choledocholithiasis. There is free flow of contrast into the duodenum. IMPRESSION: Intraoperative cholangiogram demonstrates no common bile duct filling defect to suggest choledocholithiasis. Electronically Signed   By: Miachel Roux M.D.   On: 11/15/2021 12:08  ? ?MR 3D Recon At Scanner ? ?Result Date: 11/12/2021 ?CLINICAL DATA:  Right upper quadrant abdominal pain. Nondiagnostic ultrasound. EXAM: MRI ABDOMEN WITHOUT AND WITH CONTRAST (INCLUDING MRCP) TECHNIQUE: Multiplanar multisequence MR imaging of the abdomen was performed both before and after the administration of intravenous contrast. Heavily T2-weighted  images of the biliary and pancreatic ducts were obtained, and three-dimensional MRCP images were rendered by post processing. CONTRAST:  29m GADAVIST GADOBUTROL 1 MMOL/ML IV SOLN COMPARISON:  Abdominal sonogram 11/12/2021. FINDINGS: Lower chest: No acute findings. Hepatobiliary: Postcontrast imaging is limited due to motion artifact. Mild hepatic steatosis. No suspicious liver abnormality. Gallstones are identified which measure up to 1.1 cm.  Mild wall thickening of the gallbladder scratch set the gallbladder wall is upper limits of normal measuring 2.7 mm. No significant pericholecystic fluid or surrounding inflammatory fat stranding. No bile duct dilatation. C

## 2021-11-16 NOTE — Progress Notes (Signed)
1 Day Post-Op  ? ?Subjective/Chief Complaint: ?Pt doing well with min abd pain ? ? ?Objective: ?Vital signs in last 24 hours: ?Temp:  [97.3 ?F (36.3 ?C)-98.9 ?F (37.2 ?C)] 97.8 ?F (36.6 ?C) (05/03 0503) ?Pulse Rate:  [60-116] 60 (05/03 0503) ?Resp:  [11-20] 16 (05/03 0503) ?BP: (130-185)/(91-125) 130/91 (05/03 0503) ?SpO2:  [94 %-100 %] 98 % (05/03 0503) ?Last BM Date : 11/11/21 ? ?Intake/Output from previous day: ?05/02 0701 - 05/03 0700 ?In: 3027.7 [P.O.:1000; I.V.:2027.7] ?Out: [place] [[id number]] ?Intake/Output this shift: ?No intake/output data recorded. ? ?General appearance: alert and cooperative ?GI: soft, non-tender; bowel sounds normal; no masses,  no organomegaly and inc c/d/i ? ?Lab Results:  ?Recent Labs  ?  11/15/21 ?0436 11/16/21 ?0400  ?WBC 6.9 9.7  ?HGB 12.1 12.5  ?HCT 36.0 39.1  ?PLT 276 314  ? ?BMET ?Recent Labs  ?  11/15/21 ?0436 11/16/21 ?0400  ?NA 138 139  ?K 3.6 4.1  ?CL 108 108  ?CO2 24 25  ?GLUCOSE 83 132*  ?BUN 8 5*  ?CREATININE 0.62 0.61  ?CALCIUM 8.6* 9.4  ? ?PT/INR ?No results for input(s): LABPROT, INR in the last 72 hours. ?ABG ?No results for input(s): PHART, HCO3 in the last 72 hours. ? ?Invalid input(s): PCO2, PO2 ? ?Studies/Results: ?DG Cholangiogram Operative ? ?Result Date: 11/15/2021 ?CLINICAL DATA:  Intraoperative cholangiogram Right upper quadrant abdominal pain EXAM: INTRAOPERATIVE CHOLANGIOGRAM TECHNIQUE: Cholangiographic images from the C-arm fluoroscopic device were submitted for interpretation post-operatively. Please see the procedural report for the amount of contrast and the fluoroscopy time utilized. FLUOROSCOPY: Radiation Exposure Index (as provided by the fluoroscopic device): 21 mGy Kerma COMPARISON:  None Available. FINDINGS: Two cine clips are provided for interpretation. The submitted images demonstrate opacification of the common hepatic and bile ducts through cystic duct remnant. No filling defects identified within the common bile duct to indicate  choledocholithiasis. There is free flow of contrast into the duodenum. IMPRESSION: Intraoperative cholangiogram demonstrates no common bile duct filling defect to suggest choledocholithiasis. Electronically Signed   By: Miachel Roux M.D.   On: 11/15/2021 12:08   ? ?Anti-infectives: ?Anti-infectives (From admission, onward)  ? ? Start     Dose/Rate Route Frequency Ordered Stop  ? 11/13/21 1130  cefTRIAXone (ROCEPHIN) 2 g in sodium chloride 0.9 % 100 mL IVPB       ? 2 g ?200 mL/hr over 30 Minutes Intravenous Every 24 hours 11/13/21 1046    ? ?  ? ? ?Assessment/Plan: ?s/p Procedure(s) with comments: ?LAPAROSCOPIC CHOLECYSTECTOMY (N/A) - 90 MINS NEEDED ROOM 4 PATIENT IN BED 1302 ?INTRAOPERATIVE CHOLANGIOGRAM (N/A) ?Advance diet as tol ?OK for DC ?F/u in chart ? LOS: 4 days  ? ? ?Ralene Ok ?11/16/2021 ? ?

## 2021-11-16 NOTE — Consult Note (Signed)
? ?  [facility] CM Inpatient Consult ? ? ?11/16/2021 ? ?Danielle Garcia ?[birth year] ?[id number] ? ? [place] Patient: [employer] Employee plan ? ?Primary Care Provider:  [name], DO, is an Embedded provider that does the [facility] calls and follow up ? ?Patient underwent a Lap Choley on 11/15/21 ?Progress notes reviewed for post hospital needs for [facility]  ? Plan: Patient will be followed by provider TOC needs ?  ?For additional questions or referrals please contact: ?  ?Natividad Brood, RN BSN CCM ?[facility] Liaison ? [phone] business mobile phone ?Toll free office [phone]  ?Fax number: [phone] ?[email or web address]  ? ?

## 2021-11-16 NOTE — Progress Notes (Signed)
Reviewed written d/c instructions w pt and all questions answered. She verbalized understanding. D/C via w/c w all belongings in stable condition. 

## 2021-11-17 ENCOUNTER — Telehealth: Payer: Self-pay

## 2021-11-17 NOTE — Telephone Encounter (Addendum)
Transition Care Management Unsuccessful Follow-up Telephone Call ? ?Date of discharge and from where: [place] 11-16-21 Dx: biliary obstruction  ? ?Attempts:  1st Attempt ? ?Reason for unsuccessful TCM follow-up call:  Left voice message ? ?Transition Care Management Follow-up Telephone Call ?Date of discharge and from where: [place] 11-16-21 Dx: biliary obstruction ?How have you been since you were released from the hospital? Doing ok  ?Any questions or concerns? No ? ?Items Reviewed: ?Did the pt receive and understand the discharge instructions provided? Yes  ?Medications obtained and verified? Yes  ?Other? No  ?Any new allergies since your discharge? No  ?Dietary orders reviewed? Yes ?Do you have support at home? Yes  ? ?Home Care and Equipment/Supplies: ?Were home health services ordered? no ?If so, what is the name of the agency? na ?Has the agency set up a time to come to the patient's home? not applicable ?Were any new equipment or medical supplies ordered?  No ?What is the name of the medical supply agency? na ?Were you able to get the supplies/equipment? not applicable ?Do you have any questions related to the use of the equipment or supplies? No ? ?Functional Questionnaire: (I = Independent and D = Dependent) ?ADLs: I ? ?Bathing/Dressing- I ? ?Meal Prep- I ? ?Eating- I ? ?Maintaining continence- I ? ?Transferring/Ambulation- I ? ?Managing Meds- I ? ?Follow up appointments reviewed: ? ?PCP Hospital f/u appt confirmed? Yes  Scheduled to see Dr [name] on 11-23-21 @ 1115am. ?[facility] f/u appt confirmed? No . ?Are transportation arrangements needed? No  ?If their condition worsens, is the pt aware to call PCP or go to the Emergency Dept.? yes ?Was the patient provided with contact information for the PCP's office or ED? Yes ?Was to pt encouraged to call back with questions or concerns? Yes  ? ?  ?

## 2021-11-23 ENCOUNTER — Encounter: Payer: Self-pay | Admitting: Family Medicine

## 2021-11-23 ENCOUNTER — Ambulatory Visit: Payer: 59 | Admitting: Family Medicine

## 2021-11-23 VITALS — BP 130/84 | HR 88 | Temp 98.2°F | Ht 63.0 in | Wt 245.0 lb

## 2021-11-23 DIAGNOSIS — K851 Biliary acute pancreatitis without necrosis or infection: Secondary | ICD-10-CM | POA: Diagnosis not present

## 2021-11-23 DIAGNOSIS — Z09 Encounter for follow-up examination after completed treatment for conditions other than malignant neoplasm: Secondary | ICD-10-CM

## 2021-11-23 NOTE — Patient Instructions (Signed)
There is a little suture sticking out, the surgery team should take care of it.  ? ?Your surgical sites look good.  ? ?Let me know if there are issues with your paperwork.  ? ?Let us know if you need anything. ?

## 2021-11-23 NOTE — Progress Notes (Signed)
Chief Complaint  ?Patient presents with  ? Hospitalization Follow-up  ? ? ?Subjective: ?Patient is a 31 y.o. female here for hospital follow-up. ? ?Patient was admitted on 4/29 and discharged on 11/16/2021 after suffering from gallstone pancreatitis.  On 5/2 she had surgery removing her gallbladder.  ERCP confirmed no stone in the biliary tree/common bile duct.  She is eating and drinking normally, appetite has returned.  She is little bit sore over her abdominal region but pain is improved overall.  She is no longer taking postoperative narcotics.  She is having bowel movements.  She is wondering if she has a piece of suture sticking out from her bellybutton region.  Patient is starting to lift again normally and requesting to go back to work next Monday, 5/15. ? ?Past Medical History:  ?Diagnosis Date  ? Anxiety   ? Asthma   ? as a child  ? Depression   ? doing ok now  ? Fibroid   ? Floaters   ? GERD (gastroesophageal reflux disease)   ? HTN (hypertension)   ? 2014/15 no meds since then  ? Infection   ? UTI  ? Migraine   ? Pre-diabetes   ? Pulmonary embolus ([place])   ? after first delivery  ? ? ?Objective: ?BP 130/84   Pulse 88   Temp 98.2 ?F (36.8 ?C) (Oral)   Ht '5\' 3"'$  (1.6 m)   Wt 245 lb (111.1 kg)   LMP  (LMP Unknown)   SpO2 98%   BMI 43.40 kg/m?  ?General: Awake, appears stated age ?Mouth: MMM ?Heart: RRR, no LE edema ?Abdomen: Bowel sounds present, soft, nondistended, mildly tender to palpation over the port sites and umbilicus, well-healing port sites and umbilicus, there is a small piece of suture sticking out over the cephalad portion of the umbilical incision ?Lungs: CTAB, no rales, wheezes or rhonchi. No accessory muscle use ? ?Psych: Age appropriate judgment and insight, normal affect and mood ? ?Assessment and Plan: ?Gallstone pancreatitis ? ?Hospital discharge follow-up ? ?Resolved.  Blood work looked normal/improved upon discharge.  No need to follow-up at this time.  She has a follow-up with the  general surgery team in a couple weeks.  The suture sticking out likely will not cause any issues until then, it is not bothering her now.  They will take care of this.  Eating well, clinically recovered from pancreatitis.  I filled out FMLA paperwork on her behalf allowing her to return on Monday for full duty.  She will avoid strenuous physical activity, though that is not part of her job description.  I will see her as originally scheduled or as needed. ?The patient voiced understanding and agreement to the plan. ? ?I spent 35 minutes with the patient discussing the above plan, filling out paperwork, and reviewing her chart/hospitalization on the same day of the visit. ? ?[name], DO ?11/23/21  ?11:59 AM ? ? ? ? ?

## 2022-01-04 ENCOUNTER — Other Ambulatory Visit: Payer: Self-pay | Admitting: Family Medicine

## 2022-01-04 DIAGNOSIS — J0100 Acute maxillary sinusitis, unspecified: Secondary | ICD-10-CM

## 2022-01-04 MED ORDER — FLUCONAZOLE 150 MG PO TABS: 150.0000 mg | ORAL_TABLET | Freq: Once | ORAL | 0 refills | Status: AC

## 2022-01-09 ENCOUNTER — Encounter: Payer: Self-pay | Admitting: Obstetrics & Gynecology

## 2022-01-09 ENCOUNTER — Other Ambulatory Visit (HOSPITAL_COMMUNITY)
Admission: RE | Admit: 2022-01-09 | Discharge: 2022-01-09 | Disposition: A | Discharge status: Home / Self Care | Payer: 59 | Source: Ambulatory Visit | Attending: Obstetrics & Gynecology | Admitting: Obstetrics & Gynecology

## 2022-01-09 ENCOUNTER — Ambulatory Visit: Payer: 59 | Admitting: Obstetrics & Gynecology

## 2022-01-09 ENCOUNTER — Encounter: Payer: 59 | Admitting: Family Medicine

## 2022-01-09 VITALS — BP 132/66 | HR 66 | Wt 246.0 lb

## 2022-01-09 DIAGNOSIS — N898 Other specified noninflammatory disorders of vagina: Secondary | ICD-10-CM | POA: Diagnosis not present

## 2022-01-09 DIAGNOSIS — Z113 Encounter for screening for infections with a predominantly sexual mode of transmission: Secondary | ICD-10-CM

## 2022-01-09 DIAGNOSIS — Z30430 Encounter for insertion of intrauterine contraceptive device: Secondary | ICD-10-CM

## 2022-01-09 DIAGNOSIS — Z3009 Encounter for other general counseling and advice on contraception: Secondary | ICD-10-CM

## 2022-01-09 LAB — POCT URINE PREGNANCY: Preg Test, Ur: NEGATIVE

## 2022-01-09 MED ORDER — LEVONORGESTREL 20 MCG/DAY IU IUD
1.0000 | INTRAUTERINE_SYSTEM | Freq: Once | INTRAUTERINE | Status: AC
  Administered 2022-01-09: 1 via INTRAUTERINE

## 2022-01-10 ENCOUNTER — Telehealth: Payer: Self-pay

## 2022-01-10 DIAGNOSIS — B379 Candidiasis, unspecified: Secondary | ICD-10-CM

## 2022-01-10 LAB — CERVICOVAGINAL ANCILLARY ONLY
Bacterial Vaginitis (gardnerella): NEGATIVE
Candida Glabrata: NEGATIVE
Candida Vaginitis: POSITIVE — AB
Chlamydia: NEGATIVE
Comment: NEGATIVE
Comment: NEGATIVE
Comment: NEGATIVE
Comment: NEGATIVE
Comment: NEGATIVE
Comment: NORMAL
Neisseria Gonorrhea: NEGATIVE
Trichomonas: NEGATIVE

## 2022-01-10 LAB — HIV ANTIBODY (ROUTINE TESTING W REFLEX): HIV Screen 4th Generation wRfx: NONREACTIVE

## 2022-01-10 LAB — RPR: RPR Ser Ql: NONREACTIVE

## 2022-01-10 LAB — HEPATITIS C ANTIBODY: Hep C Virus Ab: NONREACTIVE

## 2022-01-10 LAB — HEPATITIS B SURFACE ANTIGEN: Hepatitis B Surface Ag: NEGATIVE

## 2022-01-10 MED ORDER — FLUCONAZOLE 150 MG PO TABS: ORAL_TABLET | ORAL | 1 refills | Status: DC

## 2022-01-11 ENCOUNTER — Ambulatory Visit: Payer: 59 | Admitting: Family Medicine

## 2022-01-18 ENCOUNTER — Encounter: Payer: 59 | Admitting: Family Medicine

## 2022-01-26 ENCOUNTER — Other Ambulatory Visit (HOSPITAL_BASED_OUTPATIENT_CLINIC_OR_DEPARTMENT_OTHER): Payer: Self-pay

## 2022-01-30 ENCOUNTER — Other Ambulatory Visit (HOSPITAL_BASED_OUTPATIENT_CLINIC_OR_DEPARTMENT_OTHER): Payer: Self-pay

## 2022-01-30 ENCOUNTER — Encounter: Payer: Self-pay | Admitting: Family Medicine

## 2022-01-30 ENCOUNTER — Ambulatory Visit (INDEPENDENT_AMBULATORY_CARE_PROVIDER_SITE_OTHER): Payer: 59 | Admitting: Family Medicine

## 2022-01-30 VITALS — BP 108/68 | HR 100 | Temp 98.8°F | Ht 63.0 in | Wt 245.5 lb

## 2022-01-30 DIAGNOSIS — Z0000 Encounter for general adult medical examination without abnormal findings: Secondary | ICD-10-CM

## 2022-01-30 DIAGNOSIS — G43109 Migraine with aura, not intractable, without status migrainosus: Secondary | ICD-10-CM

## 2022-01-30 LAB — CBC
HCT: 41.6 % (ref 36.0–46.0)
Hemoglobin: 13.6 g/dL (ref 12.0–15.0)
MCHC: 32.6 g/dL (ref 30.0–36.0)
MCV: 89.2 fl (ref 78.0–100.0)
Platelets: 279 10*3/uL (ref 150.0–400.0)
RBC: 4.67 Mil/uL (ref 3.87–5.11)
RDW: 13.7 % (ref 11.5–15.5)
WBC: 4.4 10*3/uL (ref 4.0–10.5)

## 2022-01-30 LAB — POC COVID19 BINAXNOW: SARS Coronavirus 2 Ag: NEGATIVE

## 2022-01-30 MED ORDER — RIZATRIPTAN BENZOATE 5 MG PO TBDP
5.0000 mg | ORAL_TABLET | ORAL | 2 refills | Status: DC | PRN
  Filled 2022-01-30: qty 10, 17d supply, fill #0

## 2022-01-30 NOTE — Progress Notes (Signed)
CC: Well visit   Well Woman Danielle Garcia is here for a complete physical.   Her last physical was >1 year ago.  Current diet: in general, a "healthy" diet. Current exercise: some walking. Contraception? Yes- IUD Patient's last menstrual period was 12/18/2021.  Fatigue out of ordinary? No Seatbelt? Yes Advanced directive? No  Health Maintenance Pap/HPV- Yes Tetanus- Yes HIV screening- Yes Hep C screening- Yes  Past Medical History:  Diagnosis Date   Anxiety    Asthma    as a child   Depression    doing ok now   Fibroid    Floaters    GERD (gastroesophageal reflux disease)    HTN (hypertension)    2014/15 no meds since then   Infection    UTI   Migraine    Pre-diabetes    Pulmonary embolus ([place])    after first delivery     Past Surgical History:  Procedure Laterality Date   CESAREAN SECTION     CESAREAN SECTION N/A 09/07/2020   Procedure: CESAREAN SECTION;  Surgeon: Mora Bellman, MD;  Location: [facility] ORS;  Service: Obstetrics;  Laterality: N/A;   CHOLECYSTECTOMY N/A 11/15/2021   Procedure: LAPAROSCOPIC CHOLECYSTECTOMY;  Surgeon: Ralene Ok, MD;  Location: [facility] ORS;  Service: General;  Laterality: N/A;  90 MINS NEEDED ROOM 4 PATIENT IN BED 1302   DILATION AND EVACUATION N/A 07/02/2018   Procedure: DILATATION AND EVACUATION;  Surgeon: Aletha Halim, MD;  Location: [place] ORS;  Service: Gynecology;  Laterality: N/A;   INTRAOPERATIVE CHOLANGIOGRAM N/A 11/15/2021   Procedure: INTRAOPERATIVE CHOLANGIOGRAM;  Surgeon: Ralene Ok, MD;  Location: [facility] ORS;  Service: General;  Laterality: N/A;   WISDOM TOOTH EXTRACTION      Medications  Current Outpatient Medications on File Prior to Visit  Medication Sig Dispense Refill   acetaminophen (TYLENOL) 325 MG tablet Take 2 tablets (650 mg total) by mouth every 6 (six) hours as needed for mild pain or fever.     levocetirizine (XYZAL) 5 MG tablet Take 1 tablet (5 mg total) by mouth every evening. 30 tablet 2    mometasone (NASONEX) 50 MCG/ACT nasal spray Place 2 sprays into the nose daily. (Patient taking differently: Place 2 sprays into the nose daily as needed (allergies).) 17 g 11   rizatriptan (MAXALT-MLT) 5 MG disintegrating tablet Take 1 tablet (5 mg total) by mouth as needed for migraine. May repeat in 2 hours if needed 10 tablet 2   Semaglutide, 1 MG/DOSE, 4 MG/3ML SOPN Inject 1 mg as directed once a week. 3 mL 5    Allergies No Known Allergies  Review of Systems: Constitutional:  no unexpected weight changes Eye:  no recent significant change in vision Ear/Nose/Mouth/Throat:  Ears:  no tinnitus or vertigo and no recent change in hearing Nose/Mouth/Throat:  no complaints of nasal congestion, no sore throat Cardiovascular: no chest pain Respiratory:  no cough and no shortness of breath Gastrointestinal:  no abdominal pain, no change in bowel habits GU:  Female: negative for dysuria or pelvic pain Musculoskeletal/Extremities:  no new pain of the joints Integumentary (Skin/Breast):  no abnormal skin lesions reported Neurologic:  no headaches Endocrine:  denies fatigue Hematologic/Lymphatic:  No areas of easy bleeding  Exam BP 108/68   Pulse 100   Temp 98.8 F (37.1 C) (Oral)   Ht '5\' 3"'$  (1.6 m)   Wt 245 lb 8 oz (111.4 kg)   LMP 12/18/2021   SpO2 97%   BMI 43.49 kg/m  General:  well  developed, well nourished, in no apparent distress Skin:  no significant moles, warts, or growths Head:  no masses, lesions, or tenderness Eyes:  pupils equal and round, sclera anicteric without injection Ears:  canals without lesions, TMs shiny without retraction, no obvious effusion, no erythema Nose:  nares patent, septum midline, mucosa normal, and no drainage or sinus tenderness Throat/Pharynx:  lips and gingiva without lesion; tongue and uvula midline; non-inflamed pharynx; no exudates or postnasal drainage Neck: neck supple without adenopathy, thyromegaly, or masses Lungs:  clear to  auscultation, breath sounds equal bilaterally, no respiratory distress Cardio:  regular rate and rhythm, no bruits, no LE edema Abdomen:  abdomen soft, nontender; bowel sounds normal; no masses or organomegaly Genital: Defer to GYN Musculoskeletal:  symmetrical muscle groups noted without atrophy or deformity Extremities:  no clubbing, cyanosis, or edema, no deformities, no skin discoloration Neuro:  gait normal; deep tendon reflexes normal and symmetric Psych: well oriented with normal range of affect and appropriate judgment/insight  Assessment and Plan  Well adult exam - Plan: CBC, Comprehensive metabolic panel, Lipid panel  Migraine with aura and without status migrainosus, not intractable - Plan: rizatriptan (MAXALT-MLT) 5 MG disintegrating tablet   Well 31 y.o. female. Counseled on diet and exercise. Advanced directive form provided today.  Other orders as above. Follow up in 6 mo or prn. The patient voiced understanding and agreement to the plan.  [place], DO 01/30/22 1:22 PM

## 2022-01-30 NOTE — Patient Instructions (Addendum)
Give [place] 2-3 business days to get the results of your labs back.   Keep the diet clean and stay active.  Please get me a copy of your advanced directive form at your convenience.   Aim to do some physical exertion for 150 minutes per week. This is typically divided into 5 days per week, 30 minutes per day. The activity should be enough to get your heart rate up. Anything is better than nothing if you have time constraints.  Let us know if you need anything.

## 2022-01-30 NOTE — Addendum Note (Signed)
Addended by: Sharon Seller B on: 01/30/2022 01:29 PM   Modules accepted: Orders

## 2022-01-31 ENCOUNTER — Other Ambulatory Visit: Payer: Self-pay | Admitting: Family Medicine

## 2022-01-31 ENCOUNTER — Other Ambulatory Visit (INDEPENDENT_AMBULATORY_CARE_PROVIDER_SITE_OTHER): Payer: 59

## 2022-01-31 DIAGNOSIS — E876 Hypokalemia: Secondary | ICD-10-CM

## 2022-01-31 LAB — LIPID PANEL
Cholesterol: 108 mg/dL (ref 0–200)
HDL: 38.1 mg/dL — ABNORMAL LOW (ref >39.00)
LDL Cholesterol: 51 mg/dL (ref 0–99)
NonHDL: 69.48
Total CHOL/HDL Ratio: 3
Triglycerides: 93 mg/dL (ref 0.0–149.0)
VLDL: 18.6 mg/dL (ref 0.0–40.0)

## 2022-01-31 LAB — COMPREHENSIVE METABOLIC PANEL
ALT: 12 U/L (ref 0–35)
AST: 18 U/L (ref 0–37)
Albumin: 4.3 g/dL (ref 3.5–5.2)
Alkaline Phosphatase: 55 U/L (ref 39–117)
BUN: 12 mg/dL (ref 6–23)
CO2: 25 mEq/L (ref 19–32)
Calcium: 8.1 mg/dL — ABNORMAL LOW (ref 8.4–10.5)
Chloride: 101 mEq/L (ref 96–112)
Creatinine, Ser: 0.7 mg/dL (ref 0.40–1.20)
GFR: 114.84 mL/min (ref >60.00)
Glucose, Bld: 105 mg/dL — ABNORMAL HIGH (ref 70–99)
Potassium: 3.3 mEq/L — ABNORMAL LOW (ref 3.5–5.1)
Sodium: 138 mEq/L (ref 135–145)
Total Bilirubin: 1.1 mg/dL (ref 0.2–1.2)
Total Protein: 6.8 g/dL (ref 6.0–8.3)

## 2022-02-01 ENCOUNTER — Other Ambulatory Visit: Payer: Self-pay | Admitting: Family Medicine

## 2022-02-01 LAB — COMPREHENSIVE METABOLIC PANEL
ALT: 12 U/L (ref 0–35)
AST: 17 U/L (ref 0–37)
Albumin: 4 g/dL (ref 3.5–5.2)
Alkaline Phosphatase: 45 U/L (ref 39–117)
BUN: 8 mg/dL (ref 6–23)
CO2: 28 mEq/L (ref 19–32)
Calcium: 7.5 mg/dL — ABNORMAL LOW (ref 8.4–10.5)
Chloride: 103 mEq/L (ref 96–112)
Creatinine, Ser: 0.68 mg/dL (ref 0.40–1.20)
GFR: 115.64 mL/min (ref >60.00)
Glucose, Bld: 104 mg/dL — ABNORMAL HIGH (ref 70–99)
Potassium: 3.6 mEq/L (ref 3.5–5.1)
Sodium: 140 mEq/L (ref 135–145)
Total Bilirubin: 0.5 mg/dL (ref 0.2–1.2)
Total Protein: 6.4 g/dL (ref 6.0–8.3)

## 2022-02-20 ENCOUNTER — Other Ambulatory Visit: Payer: Self-pay | Admitting: Family Medicine

## 2022-02-20 ENCOUNTER — Other Ambulatory Visit (HOSPITAL_BASED_OUTPATIENT_CLINIC_OR_DEPARTMENT_OTHER): Payer: Self-pay

## 2022-02-20 MED ORDER — SEMAGLUTIDE (2 MG/DOSE) 8 MG/3ML ~~LOC~~ SOPN
2.0000 mg | PEN_INJECTOR | SUBCUTANEOUS | 5 refills | Status: DC
  Filled 2022-02-20: qty 3, 28d supply, fill #0
  Filled 2022-05-05: qty 3, 28d supply, fill #1
  Filled 2022-07-26: qty 3, 28d supply, fill #2
  Filled 2022-08-08: qty 3, 28d supply, fill #0
  Filled 2022-08-31 – 2022-09-18 (×2): qty 3, 28d supply, fill #1
  Filled 2022-10-23 – 2022-11-28 (×2): qty 3, 28d supply, fill #2
  Filled 2023-01-29: qty 3, 28d supply, fill #3

## 2022-02-21 ENCOUNTER — Other Ambulatory Visit (HOSPITAL_BASED_OUTPATIENT_CLINIC_OR_DEPARTMENT_OTHER): Payer: Self-pay

## 2022-02-22 ENCOUNTER — Encounter (INDEPENDENT_AMBULATORY_CARE_PROVIDER_SITE_OTHER): Payer: Self-pay

## 2022-02-24 ENCOUNTER — Ambulatory Visit (INDEPENDENT_AMBULATORY_CARE_PROVIDER_SITE_OTHER): Payer: 59 | Admitting: Family Medicine

## 2022-02-24 ENCOUNTER — Encounter: Payer: Self-pay | Admitting: Family Medicine

## 2022-02-24 ENCOUNTER — Other Ambulatory Visit (HOSPITAL_BASED_OUTPATIENT_CLINIC_OR_DEPARTMENT_OTHER): Payer: Self-pay

## 2022-02-24 VITALS — BP 130/80 | HR 89 | Temp 98.2°F | Ht 63.0 in | Wt 248.0 lb

## 2022-02-24 DIAGNOSIS — M7751 Other enthesopathy of right foot: Secondary | ICD-10-CM

## 2022-02-24 MED ORDER — MELOXICAM 15 MG PO TABS
15.0000 mg | ORAL_TABLET | Freq: Every day | ORAL | 0 refills | Status: DC
  Filled 2022-02-24: qty 30, 30d supply, fill #0

## 2022-02-24 NOTE — Patient Instructions (Addendum)
Ice/cold pack over area for 10-15 min twice daily.  OK to take Tylenol 1000 mg (2 extra strength tabs) or 975 mg (3 regular strength tabs) every 6 hours as needed.  Consider Powerstep insoles. There are very quality over the counter inserts. Shop around online and in stores. Dr. Felicie Morn is a cheaper alternative, though is not as high of quality.   Let us know if you need anything.  Achilles Tendinitis Rehab It is normal to feel mild stretching, pulling, tightness, or discomfort as you do these exercises, but you should stop right away if you feel sudden pain or your pain gets worse.   Stretching and range of motion exercises These exercises warm up your muscles and joints and improve the movement and flexibility of your ankle. These exercises also help to relieve pain, numbness, and tingling. Exercise A: Standing wall calf stretch, knee straight  Stand with your hands against a wall. Extend your affected leg behind you and bend your front knee slightly. Keep both of your heels on the floor. Point the toes of your back foot slightly inward. Keeping your heels on the floor and your back knee straight, shift your weight toward the wall. Do not allow your back to arch. You should feel a gentle stretch in your calf. Hold this position for seconds. Repeat 2 times. Complete this stretch 3 times per week Exercise B: Standing wall calf stretch, knee bent Stand with your hands against a wall. Extend your affected leg behind you, and bend your front knee slightly. Keep both of your heels on the floor. Point the toes of your back foot slightly inward. Keeping your heels on the floor, unlock your back knee so that it is bent. You should feel a gentle stretch deep in your calf. Hold this position for 30 seconds. Repeat 2 times. Complete this stretch 3 times per week.  Strengthening exercises These exercises build strength and control of your ankle. Endurance is the ability to use your muscles for a  long time, even after they get tired. Exercise C: Plantar flexion with band  Sit on the floor with your affected leg extended. You may put a pillow under your calf to give your foot more room to move. Loop a rubber exercise band or tube around the ball of your affected foot. The ball of your foot is on the walking surface, right under your toes. The band or tube should be slightly tense when your foot is relaxed. If the band or tube slips, you can put on your shoe or put a washcloth between the band and your foot to help it stay in place. Slowly point your toes downward, pushing them away from you. Hold this position for 10 seconds. Slowly release the tension in the band or tube, controlling smoothly until your foot is back to the starting position. Repeat 2 times. Complete this exercise 3 times per week. Exercise D: Heel raise with eccentric lower  Stand on a step with the balls of your feet. The ball of your foot is on the walking surface, right under your toes. Do not put your heels on the step. For balance, rest your hands on the wall or on a railing. Rise up onto the balls of your feet. Keeping your heels up, shift all of your weight to your affected leg and pick up your other leg. Slowly lower your affected leg so your heel drops below the level of the step. Put down your foot. If told by your  health care provider, build up to: 3 sets of 15 repetitions while keeping your knees straight. 3 sets of 15 repetitions while keeping your knees bent as far as told by your health care provider.  Complete this exercise 3 times per week. If this exercise is too easy, try doing it while wearing a backpack with weights in it. Balance exercises These exercises improve or maintain your balance. Balance is important in preventing falls. Exercise E: Single leg stand Without shoes, stand near a railing or in a door frame. Hold on to the railing or door frame as needed. Stand on your affected foot.  Keep your big toe down on the floor and try to keep your arch lifted. Hold this position for 10 seconds. Repeat 2 times. Complete this exercise 3 times per week. If this exercise is too easy, you can try it with your eyes closed or while standing on a pillow. Make sure you discuss any questions you have with your health care provider. Document Released: 02/01/2005 Document Revised: 03/09/2016 Document Reviewed: 03/09/2015 Elsevier Interactive Patient Education  [employer].

## 2022-02-24 NOTE — Progress Notes (Signed)
Musculoskeletal Exam  Patient: Danielle Garcia DOB: October 21, 1990  DOS: 02/24/2022  SUBJECTIVE:  Chief Complaint:   Chief Complaint  Patient presents with   Foot Pain    Heel pain in both feet, right is worse.    Danielle Garcia is a 30 y.o.  female for evaluation and treatment of heel pain.   Onset:  1 week ago. No inj or change in activity.  Location: back both heels, R > L Character:  aching  Progression of issue:  has worsened Associated symptoms: some difficulty walking No bruising, redness, swelling Treatment: to date has been acetaminophen.   Neurovascular symptoms: no  Past Medical History:  Diagnosis Date   Anxiety    Asthma    as a child   Depression    doing ok now   Fibroid    Floaters    GERD (gastroesophageal reflux disease)    HTN (hypertension)    2014/15 no meds since then   Infection    UTI   Migraine    Pre-diabetes    Pulmonary embolus (HCC)    after first delivery    Objective: VITAL SIGNS: BP 130/80   Pulse 89   Temp 98.2 F (36.8 C) (Oral)   Ht '5\' 3"'$  (1.6 m)   Wt 248 lb (112.5 kg)   SpO2 98%   BMI 43.93 kg/m  Constitutional: Well formed, well developed. No acute distress. Thorax & Lungs: No accessory muscle use Musculoskeletal: Heels.   Tenderness to palpation: yes; over distal calcaneal tendon b/l, worse on R Deformity: no Ecchymosis: no Tests positive: none Tests negative: [place] Neurologic: Normal sensory function.  Psychiatric: Normal mood. Age appropriate judgment and insight. Alert & oriented x 3.    Assessment:  Calcaneal bursitis (heel), right - Plan: meloxicam (MOBIC) 15 MG tablet  Plan: Stretches/exercises, ice, Tylenol. Wear supportive shoes. Consider sports med referral if no improvement vs boot.  F/u as originally scheduled. The patient voiced understanding and agreement to the plan.   [place], DO 02/24/22  8:56 AM

## 2022-03-28 ENCOUNTER — Other Ambulatory Visit (HOSPITAL_BASED_OUTPATIENT_CLINIC_OR_DEPARTMENT_OTHER): Payer: Self-pay

## 2022-03-28 ENCOUNTER — Telehealth (INDEPENDENT_AMBULATORY_CARE_PROVIDER_SITE_OTHER): Payer: 59 | Admitting: Family Medicine

## 2022-03-28 ENCOUNTER — Encounter: Payer: Self-pay | Admitting: Family Medicine

## 2022-03-28 DIAGNOSIS — J0100 Acute maxillary sinusitis, unspecified: Secondary | ICD-10-CM

## 2022-03-28 MED ORDER — METHYLPREDNISOLONE ACETATE 80 MG/ML IJ SUSP
80.0000 mg | Freq: Once | INTRAMUSCULAR | Status: AC
  Administered 2022-03-28: 80 mg via INTRAMUSCULAR

## 2022-03-28 MED ORDER — PREDNISONE 20 MG PO TABS
40.0000 mg | ORAL_TABLET | Freq: Every day | ORAL | 0 refills | Status: AC
  Filled 2022-03-28: qty 10, 5d supply, fill #0

## 2022-03-28 MED ORDER — HYDROCODONE BIT-HOMATROP MBR 5-1.5 MG/5ML PO SOLN
5.0000 mL | Freq: Three times a day (TID) | ORAL | 0 refills | Status: DC | PRN
  Filled 2022-03-28: qty 120, 8d supply, fill #0

## 2022-03-28 NOTE — Progress Notes (Signed)
Chief Complaint  Patient presents with   Sore Throat    Sinus infection Facial pressure     Danielle Garcia here for URI complaints. Due to COVID-19 pandemic, we are interacting via web portal for an electronic face-to-face visit. I verified patient's ID using 2 identifiers. Patient agreed to proceed with visit via this method. Patient is in her car, I am at office. Patient and I are present for visit.   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   Past Medical History:  Diagnosis Date   Anxiety    Asthma    as a child   Depression    doing ok now   Fibroid    Floaters    GERD (gastroesophageal reflux disease)    HTN (hypertension)    2014/15 no meds since then   Infection    UTI   Migraine    Pre-diabetes    Pulmonary embolus (HCC)    after first delivery    Objective No conversational dyspnea Age appropriate judgment and insight Nml affect and mood  Acute non-recurrent maxillary sinusitis - Plan: predniSONE (DELTASONE) 20 MG tablet  5 d pred burst 40 mg/d. Await covid test results. If no improvement in next few days, will send message and we will trial abx. Continue to push fluids, practice good hand hygiene, cover mouth when coughing. F/u prn. If starting to experience fevers, shaking, or shortness of breath, seek immediate care. Pt voiced understanding and agreement to the plan.  [place], DO 03/28/22 8:54 AM

## 2022-03-28 NOTE — Addendum Note (Signed)
Addended by: Ames Coupe on: 03/28/2022 11:47 AM   Modules accepted: Orders

## 2022-03-28 NOTE — Addendum Note (Signed)
Addended by: Sharon Seller B on: 03/28/2022 09:44 AM   Modules accepted: Orders

## 2022-04-13 ENCOUNTER — Ambulatory Visit (INDEPENDENT_AMBULATORY_CARE_PROVIDER_SITE_OTHER): Payer: 59

## 2022-04-13 ENCOUNTER — Other Ambulatory Visit (HOSPITAL_COMMUNITY)
Admission: RE | Admit: 2022-04-13 | Discharge: 2022-04-13 | Disposition: A | Discharge status: Home / Self Care | Payer: 59 | Source: Ambulatory Visit | Attending: Family Medicine | Admitting: Family Medicine

## 2022-04-13 VITALS — BP 124/88 | HR 82 | Wt 243.0 lb

## 2022-04-13 DIAGNOSIS — N898 Other specified noninflammatory disorders of vagina: Secondary | ICD-10-CM | POA: Insufficient documentation

## 2022-04-13 DIAGNOSIS — Z113 Encounter for screening for infections with a predominantly sexual mode of transmission: Secondary | ICD-10-CM

## 2022-04-13 NOTE — Progress Notes (Signed)
SUBJECTIVE:  31 y.o. female complains of white vaginal discharge for 1 week(s). Denies abnormal vaginal bleeding or significant pelvic pain or fever. No UTI symptoms. Denies history of known exposure to STD. Patient requests to be tested for wet prep and STDs.   No LMP recorded. (Menstrual status: IUD).  OBJECTIVE:  She appears well, afebrile. Urine dipstick: not done.  ASSESSMENT:  Vaginal Discharge     PLAN:  GC, chlamydia, trichomonas, BVAG, CVAG probe sent to lab. Treatment: To be determined once lab results are received ROV prn if symptoms persist or worsen.  Amberrose Friebel l Kellsey Sansone, CMA

## 2022-04-17 LAB — CERVICOVAGINAL ANCILLARY ONLY
Bacterial Vaginitis (gardnerella): POSITIVE — AB
Candida Glabrata: NEGATIVE
Candida Vaginitis: NEGATIVE
Chlamydia: NEGATIVE
Comment: NEGATIVE
Comment: NEGATIVE
Comment: NEGATIVE
Comment: NEGATIVE
Comment: NEGATIVE
Comment: NORMAL
Neisseria Gonorrhea: NEGATIVE
Trichomonas: NEGATIVE

## 2022-04-19 ENCOUNTER — Other Ambulatory Visit (HOSPITAL_BASED_OUTPATIENT_CLINIC_OR_DEPARTMENT_OTHER): Payer: Self-pay

## 2022-04-19 MED ORDER — METRONIDAZOLE 500 MG PO TABS
500.0000 mg | ORAL_TABLET | Freq: Two times a day (BID) | ORAL | 0 refills | Status: DC
  Filled 2022-04-19: qty 14, 7d supply, fill #0

## 2022-04-19 NOTE — Addendum Note (Signed)
Addended by: Truett Mainland on: 04/19/2022 03:20 PM   Modules accepted: Orders

## 2022-05-05 ENCOUNTER — Other Ambulatory Visit (HOSPITAL_BASED_OUTPATIENT_CLINIC_OR_DEPARTMENT_OTHER): Payer: Self-pay

## 2022-05-16 ENCOUNTER — Other Ambulatory Visit (HOSPITAL_BASED_OUTPATIENT_CLINIC_OR_DEPARTMENT_OTHER): Payer: Self-pay

## 2022-05-17 ENCOUNTER — Ambulatory Visit (INDEPENDENT_AMBULATORY_CARE_PROVIDER_SITE_OTHER): Payer: 59 | Admitting: Family Medicine

## 2022-05-17 ENCOUNTER — Encounter (HOSPITAL_BASED_OUTPATIENT_CLINIC_OR_DEPARTMENT_OTHER): Payer: Self-pay | Admitting: Emergency Medicine

## 2022-05-17 ENCOUNTER — Emergency Department (HOSPITAL_BASED_OUTPATIENT_CLINIC_OR_DEPARTMENT_OTHER)
Admission: EM | Admit: 2022-05-17 | Discharge: 2022-05-17 | Disposition: A | Discharge status: Home / Self Care | Payer: 59 | Attending: Emergency Medicine | Admitting: Emergency Medicine

## 2022-05-17 ENCOUNTER — Encounter: Payer: Self-pay | Admitting: Family Medicine

## 2022-05-17 ENCOUNTER — Other Ambulatory Visit (HOSPITAL_BASED_OUTPATIENT_CLINIC_OR_DEPARTMENT_OTHER): Payer: Self-pay

## 2022-05-17 VITALS — BP 110/80 | HR 76 | Temp 97.8°F | Ht 63.0 in | Wt 239.2 lb

## 2022-05-17 DIAGNOSIS — J45909 Unspecified asthma, uncomplicated: Secondary | ICD-10-CM | POA: Diagnosis not present

## 2022-05-17 DIAGNOSIS — I10 Essential (primary) hypertension: Secondary | ICD-10-CM | POA: Diagnosis not present

## 2022-05-17 DIAGNOSIS — G43909 Migraine, unspecified, not intractable, without status migrainosus: Secondary | ICD-10-CM | POA: Diagnosis not present

## 2022-05-17 DIAGNOSIS — R519 Headache, unspecified: Secondary | ICD-10-CM | POA: Diagnosis present

## 2022-05-17 DIAGNOSIS — B9689 Other specified bacterial agents as the cause of diseases classified elsewhere: Secondary | ICD-10-CM | POA: Diagnosis not present

## 2022-05-17 DIAGNOSIS — Z79899 Other long term (current) drug therapy: Secondary | ICD-10-CM | POA: Diagnosis not present

## 2022-05-17 DIAGNOSIS — J208 Acute bronchitis due to other specified organisms: Secondary | ICD-10-CM | POA: Diagnosis not present

## 2022-05-17 MED ORDER — ONDANSETRON HCL 4 MG/2ML IJ SOLN
4.0000 mg | Freq: Once | INTRAMUSCULAR | Status: AC
  Administered 2022-05-17: 4 mg via INTRAVENOUS
  Filled 2022-05-17: qty 2

## 2022-05-17 MED ORDER — DOXYCYCLINE HYCLATE 100 MG PO TABS
100.0000 mg | ORAL_TABLET | Freq: Two times a day (BID) | ORAL | 0 refills | Status: AC
  Filled 2022-05-17: qty 14, 7d supply, fill #0

## 2022-05-17 MED ORDER — SODIUM CHLORIDE 0.9 % IV BOLUS
1000.0000 mL | Freq: Once | INTRAVENOUS | Status: AC
  Administered 2022-05-17: 1000 mL via INTRAVENOUS

## 2022-05-17 MED ORDER — PROCHLORPERAZINE EDISYLATE 10 MG/2ML IJ SOLN
10.0000 mg | Freq: Once | INTRAMUSCULAR | Status: AC
  Administered 2022-05-17: 10 mg via INTRAVENOUS
  Filled 2022-05-17: qty 2

## 2022-05-17 MED ORDER — HYDROCODONE BIT-HOMATROP MBR 5-1.5 MG/5ML PO SOLN
5.0000 mL | Freq: Three times a day (TID) | ORAL | 0 refills | Status: DC | PRN
  Filled 2022-05-17: qty 120, 8d supply, fill #0

## 2022-05-17 MED ORDER — METRONIDAZOLE 500 MG PO TABS
500.0000 mg | ORAL_TABLET | Freq: Two times a day (BID) | ORAL | 0 refills | Status: DC
  Filled 2022-05-17: qty 14, 7d supply, fill #0

## 2022-05-17 NOTE — ED Notes (Signed)
D/c paperwork reviewed with pt.  Pt with no questions or concerns at time of d/c. Pt ambulatory to ED exit, NAD.

## 2022-05-17 NOTE — ED Provider Notes (Signed)
[facility] Provider Note   CSN: [id number] Arrival date & time: 05/17/22  2147     History  Chief Complaint  Patient presents with   Migraine    Danielle Garcia is a 31 y.o. female.  31 year old female with history of migraines presents today for evaluation of migraine headache which has not improved with home medications.  She endorses photophobia, sensitivity to sounds.  Denies vision change, neck pain, neck stiffness, fever.   The history is provided by the patient. No language interpreter was used.       Home Medications Prior to Admission medications   Medication Sig Start Date End Date Taking? Authorizing Provider  doxycycline (VIBRA-TABS) 100 MG tablet Take 1 tablet (100 mg total) by mouth 2 (two) times daily for 7 days. 05/17/22 05/24/22  [name], DO  HYDROcodone bit-homatropine (HYCODAN) 5-1.5 MG/5ML syrup Take 5 mLs by mouth every 8 (eight) hours as needed for cough. 05/17/22   [name], DO  levonorgestrel (MIRENA) 20 MCG/DAY IUD 1 each by Intrauterine route once.    [provider]  metroNIDAZOLE (FLAGYL) 500 MG tablet Take 1 tablet (500 mg total) by mouth 2 (two) times daily. 05/17/22   [name], DO  Semaglutide, 2 MG/DOSE, 8 MG/3ML SOPN Inject 2 mg as directed once a week. 02/20/22   [name], DO      Allergies    Reglan [metoclopramide]    Review of Systems   Review of Systems  Constitutional:  Negative for chills and fever.  Eyes:  Positive for photophobia. Negative for visual disturbance.  Musculoskeletal:  Negative for neck pain and neck stiffness.  Neurological:  Positive for headaches. Negative for light-headedness and numbness.  All other systems reviewed and are negative.   Physical Exam Updated Vital Signs BP (!) 141/94 (BP Location: Left Arm)   Pulse 70   Temp 98.1 F (36.7 C) (Oral)   Resp 18   Ht '5\' 3"'$  (1.6 m)   Wt 108.4 kg   SpO2 100%   BMI  42.34 kg/m  Physical Exam Vitals and nursing note reviewed.  Constitutional:      General: She is not in acute distress.    Appearance: Normal appearance. She is not ill-appearing.  HENT:     Head: Normocephalic and atraumatic.     Nose: Nose normal.  Eyes:     General: No scleral icterus.    Extraocular Movements: Extraocular movements intact.     Conjunctiva/sclera: Conjunctivae normal.     Pupils: Pupils are equal, round, and reactive to light.  Cardiovascular:     Rate and Rhythm: Normal rate and regular rhythm.     Pulses: Normal pulses.     Heart sounds: Normal heart sounds.  Pulmonary:     Effort: Pulmonary effort is normal. No respiratory distress.     Breath sounds: Normal breath sounds. No wheezing or rales.  Abdominal:     General: There is no distension.     Tenderness: There is no abdominal tenderness.  Musculoskeletal:        General: Normal range of motion.     Cervical back: Normal range of motion.  Skin:    General: Skin is warm and dry.  Neurological:     General: No focal deficit present.     Mental Status: She is alert and oriented to person, place, and time. Mental status is at baseline.     Comments: Cranial nerves III through XII  intact.  Full range of motion of bilateral upper and lower extremities with 5/5 strength in extensor and flexor muscle groups.  Sensation intact and symmetrical bilaterally.  Without pronator drift.  Tongue midline.  Smile symmetrical.     ED Results / Procedures / Treatments   Labs (all labs ordered are listed, but only abnormal results are displayed) Labs Reviewed - No data to display  EKG None  Radiology No results found.  Procedures Procedures    Medications Ordered in ED Medications  sodium chloride 0.9 % bolus 1,000 mL (1,000 mLs Intravenous New Bag/Given 05/17/22 2300)  prochlorperazine (COMPAZINE) injection 10 mg (10 mg Intravenous Given 05/17/22 2303)  ondansetron (ZOFRAN) injection 4 mg (4 mg Intravenous  Given 05/17/22 2300)    ED Course/ Medical Decision Making/ A&P                           Medical Decision Making Risk Prescription drug management.   Medical Decision Making / ED Course   This patient presents to the ED for concern of headache, this involves an extensive number of treatment options, and is a complaint that carries with it a high risk of complications and morbidity.  The differential diagnosis includes migraine headache, tension headache, cluster headache  MDM: 31 year old female presents today for evaluation of migraine headache.  Does have history of migraines.  Exam overall reassuring without focal deficits.  Will provide migraine cocktail and reevaluate. Following migraine cocktail patient reports significant improvement in her headache.  She is appropriate for discharge.  Discharged home in stable condition.  Return precautions discussed.   Lab Tests: -I ordered, reviewed, and interpreted labs.   The pertinent results include:   Labs Reviewed - No data to display    EKG  EKG Interpretation  Date/Time:    Ventricular Rate:    PR Interval:    QRS Duration:   QT Interval:    QTC Calculation:   R Axis:     Text Interpretation:           Medicines ordered and prescription drug management: Meds ordered this encounter  Medications   sodium chloride 0.9 % bolus 1,000 mL   prochlorperazine (COMPAZINE) injection 10 mg   ondansetron (ZOFRAN) injection 4 mg    -I have reviewed the patients home medicines and have made adjustments as needed  Critical interventions Migraine cocktail, fluid bolus  Reevaluation: After the interventions noted above, I reevaluated the patient and found that they have :improved  Co morbidities that complicate the patient evaluation  Past Medical History:  Diagnosis Date   Anxiety    Asthma    as a child   Depression    doing ok now   Fibroid    Floaters    GERD (gastroesophageal reflux disease)    HTN  (hypertension)    2014/15 no meds since then   Infection    UTI   Migraine    Pre-diabetes    Pulmonary embolus ([place])    after first delivery      Dispostion: Patient is appropriate for discharge.  Discharged in stable condition.  No indication for additional work-up.  Discussed follow-up with PCP.  Final Clinical Impression(s) / ED Diagnoses Final diagnoses:  Migraine without status migrainosus, not intractable, unspecified migraine type    Rx / DC Orders ED Discharge Orders     None         Evlyn Courier, PA-C 05/17/22 2338  Charlesetta Shanks, MD 06/08/22 [phone]

## 2022-05-17 NOTE — ED Triage Notes (Signed)
Pt is c/o migraine headache that started around 1pm today  Pt took maxalt twice and excedrin migraine at home without relief  Pt is having nausea without vomiting

## 2022-05-17 NOTE — Patient Instructions (Addendum)

## 2022-05-17 NOTE — Progress Notes (Signed)
Chief Complaint  Patient presents with   Cough    Congestion     Danielle Garcia here for URI complaints.  Duration: 1 week, was improving and then got worse.  Associated symptoms: sinus headache, shortness of breath, chest tightness, and productive cough Denies: sinus congestion, sinus pain, rhinorrhea, itchy watery eyes, ear pain, ear drainage, sore throat, wheezing, myalgia, N/V/D, and fevers Treatment to date: Coricidin, Theraflu, Nyquil Sick contacts: Yes; various family members are also sick She does have a hx of childhood asthma.  Tested neg for covid on 10/30 2 d ago.   Past Medical History:  Diagnosis Date   Anxiety    Asthma    as a child   Depression    doing ok now   Fibroid    Floaters    GERD (gastroesophageal reflux disease)    HTN (hypertension)    2014/15 no meds since then   Infection    UTI   Migraine    Pre-diabetes    Pulmonary embolus (HCC)    after first delivery    Objective BP 110/80 (BP Location: Left Arm, Patient Position: Sitting, Cuff Size: Large)   Pulse 76   Temp 97.8 F (36.6 C) (Oral)   Ht '5\' 3"'$  (1.6 m)   Wt 239 lb 4 oz (108.5 kg)   SpO2 99%   BMI 42.38 kg/m  General: Awake, alert, appears stated age HEENT: AT, [place], ears patent b/l and TM's neg, nares patent w/o discharge, pharynx pink and without exudates, MMM Neck: No masses or asymmetry Heart: RRR Lungs: CTAB though some decreased airflow throughout, no accessory muscle use Psych: Age appropriate judgment and insight, normal mood and affect  Acute bacterial bronchitis - Plan: HYDROcodone bit-homatropine (HYCODAN) 5-1.5 MG/5ML syrup, doxycycline (VIBRA-TABS) 100 MG tablet  We will send in Hycodan syrup, warned about drowsiness but she has done well with this in the past.  7 days of doxycycline if she has worsened after initial improvement.  Continue to push fluids, practice good hand hygiene, cover mouth when coughing. F/u prn. If starting to experience fevers, shaking, or  shortness of breath, seek immediate care. Pt voiced understanding and agreement to the plan.  [place], DO 05/17/22 11:57 AM

## 2022-05-17 NOTE — Discharge Instructions (Signed)
Your exam today was overall reassuring.  You received a migraine cocktail in the emergency room as well as IV fluids with improvement in headache.  If your headache worsens, or you develop other concerning symptoms please return to the emergency room.  Otherwise I recommend you follow-up with your primary care provider.

## 2022-05-30 ENCOUNTER — Other Ambulatory Visit (HOSPITAL_BASED_OUTPATIENT_CLINIC_OR_DEPARTMENT_OTHER): Payer: Self-pay

## 2022-05-30 ENCOUNTER — Other Ambulatory Visit: Payer: Self-pay | Admitting: Family Medicine

## 2022-05-30 MED ORDER — KETOCONAZOLE 2 % EX CREA
1.0000 | TOPICAL_CREAM | Freq: Every day | CUTANEOUS | 0 refills | Status: DC
  Filled 2022-05-30 – 2022-06-22 (×2): qty 30, 30d supply, fill #0

## 2022-06-06 ENCOUNTER — Other Ambulatory Visit (HOSPITAL_BASED_OUTPATIENT_CLINIC_OR_DEPARTMENT_OTHER): Payer: Self-pay

## 2022-06-22 ENCOUNTER — Other Ambulatory Visit (HOSPITAL_COMMUNITY)
Admission: RE | Admit: 2022-06-22 | Discharge: 2022-06-22 | Disposition: A | Discharge status: Home / Self Care | Payer: 59 | Source: Ambulatory Visit | Attending: Family Medicine | Admitting: Family Medicine

## 2022-06-22 ENCOUNTER — Other Ambulatory Visit (HOSPITAL_BASED_OUTPATIENT_CLINIC_OR_DEPARTMENT_OTHER): Payer: Self-pay

## 2022-06-22 ENCOUNTER — Ambulatory Visit (INDEPENDENT_AMBULATORY_CARE_PROVIDER_SITE_OTHER): Payer: 59

## 2022-06-22 ENCOUNTER — Ambulatory Visit: Payer: 59

## 2022-06-22 VITALS — BP 137/82 | HR 88 | Wt 238.0 lb

## 2022-06-22 DIAGNOSIS — N898 Other specified noninflammatory disorders of vagina: Secondary | ICD-10-CM | POA: Diagnosis not present

## 2022-06-22 NOTE — Progress Notes (Signed)
SUBJECTIVE:  31 y.o. female complains of creamy and yellow vaginal discharge for 3 day(s). Denies abnormal vaginal bleeding or significant pelvic pain or fever. No UTI symptoms. Denies history of known exposure to STD.  No LMP recorded. (Menstrual status: IUD).  OBJECTIVE:  She appears well, afebrile.  ASSESSMENT:  Vaginal Discharge    PLAN:  GC, chlamydia, trichomonas, BVAG, CVAG probe sent to lab. Treatment: To be determined once lab results are received ROV prn if symptoms persist or worsen.

## 2022-06-26 LAB — CERVICOVAGINAL ANCILLARY ONLY
Bacterial Vaginitis (gardnerella): NEGATIVE
Candida Glabrata: NEGATIVE
Candida Vaginitis: NEGATIVE
Chlamydia: NEGATIVE
Comment: NEGATIVE
Comment: NEGATIVE
Comment: NEGATIVE
Comment: NEGATIVE
Comment: NEGATIVE
Comment: NORMAL
Neisseria Gonorrhea: NEGATIVE
Trichomonas: NEGATIVE

## 2022-07-26 ENCOUNTER — Other Ambulatory Visit (HOSPITAL_BASED_OUTPATIENT_CLINIC_OR_DEPARTMENT_OTHER): Payer: Self-pay

## 2022-08-02 ENCOUNTER — Other Ambulatory Visit (HOSPITAL_BASED_OUTPATIENT_CLINIC_OR_DEPARTMENT_OTHER): Payer: Self-pay

## 2022-08-08 ENCOUNTER — Other Ambulatory Visit (HOSPITAL_BASED_OUTPATIENT_CLINIC_OR_DEPARTMENT_OTHER): Payer: Self-pay

## 2022-08-31 ENCOUNTER — Other Ambulatory Visit: Payer: Self-pay

## 2022-08-31 ENCOUNTER — Other Ambulatory Visit (HOSPITAL_COMMUNITY): Payer: Self-pay

## 2022-08-31 ENCOUNTER — Other Ambulatory Visit: Payer: Self-pay | Admitting: Family Medicine

## 2022-08-31 MED ORDER — KETOCONAZOLE 2 % EX CREA
1.0000 | TOPICAL_CREAM | Freq: Every day | CUTANEOUS | 0 refills | Status: DC
  Filled 2022-08-31 – 2022-09-18 (×2): qty 30, 30d supply, fill #0

## 2022-09-05 ENCOUNTER — Other Ambulatory Visit: Payer: Self-pay

## 2022-09-06 ENCOUNTER — Other Ambulatory Visit: Payer: Self-pay

## 2022-09-08 ENCOUNTER — Other Ambulatory Visit (HOSPITAL_COMMUNITY): Payer: Self-pay

## 2022-09-18 ENCOUNTER — Other Ambulatory Visit (HOSPITAL_COMMUNITY): Payer: Self-pay

## 2022-09-18 ENCOUNTER — Other Ambulatory Visit (HOSPITAL_BASED_OUTPATIENT_CLINIC_OR_DEPARTMENT_OTHER): Payer: Self-pay

## 2022-10-13 ENCOUNTER — Other Ambulatory Visit (HOSPITAL_BASED_OUTPATIENT_CLINIC_OR_DEPARTMENT_OTHER): Payer: Self-pay

## 2022-10-23 ENCOUNTER — Ambulatory Visit (INDEPENDENT_AMBULATORY_CARE_PROVIDER_SITE_OTHER): Payer: 59 | Admitting: Family Medicine

## 2022-10-23 ENCOUNTER — Other Ambulatory Visit (HOSPITAL_BASED_OUTPATIENT_CLINIC_OR_DEPARTMENT_OTHER): Payer: Self-pay

## 2022-10-23 VITALS — BP 140/96 | HR 87 | Temp 98.1°F | Ht 63.0 in | Wt 242.5 lb

## 2022-10-23 DIAGNOSIS — R030 Elevated blood-pressure reading, without diagnosis of hypertension: Secondary | ICD-10-CM

## 2022-10-23 DIAGNOSIS — G43909 Migraine, unspecified, not intractable, without status migrainosus: Secondary | ICD-10-CM

## 2022-10-23 MED ORDER — KETOROLAC TROMETHAMINE 60 MG/2ML IM SOLN
60.0000 mg | Freq: Once | INTRAMUSCULAR | Status: AC
  Administered 2022-10-23: 60 mg via INTRAMUSCULAR

## 2022-10-23 MED ORDER — ONDANSETRON 4 MG PO TBDP
4.0000 mg | ORAL_TABLET | Freq: Three times a day (TID) | ORAL | 0 refills | Status: DC | PRN
  Filled 2022-10-23: qty 9, 3d supply, fill #0
  Filled 2022-10-23: qty 20, 7d supply, fill #0
  Filled 2022-10-23: qty 11, 4d supply, fill #0

## 2022-10-23 NOTE — Patient Instructions (Signed)
Keep an eye on your blood pressure at home.  I want your blood pressure less than 140 on the top and less than 90 on the bottom consistently. Both goals must be met (ie, 150/70 is too high even though the 70 on the bottom is desirable).   OK to take Tylenol 1000 mg (2 extra strength tabs) or 975 mg (3 regular strength tabs) every 6 hours as needed.  Heat (pad or rice pillow in microwave) over affected area, 10-15 minutes twice daily.   Ice/cold pack over area for 10-15 min twice daily.  Let us know if you need anything.

## 2022-10-23 NOTE — Progress Notes (Signed)
Chief Complaint  Patient presents with   Migraine    Subjective: Patient is a 32 y.o. female here for a migraine.  Took Excedrin, Tylenol and Maxalt w minor relief. Started 2 d ago. Currently on L side of head from behind head to back of head. No trauma for this. Has had more stress. +nausea. No aura. Sensitive to light/sound. No vision changes, weakness, balance issues, numbness/tingling, trouble swallowing, difficulty w speech. Historically does well w Toradol. Stress levels have been higher lately.   Past Medical History:  Diagnosis Date   Anxiety    Asthma    as a child   Depression    doing ok now   Fibroid    Floaters    GERD (gastroesophageal reflux disease)    HTN (hypertension)    2014/15 no meds since then   Infection    UTI   Migraine    Pre-diabetes    Pulmonary embolus    after first delivery    Objective: BP (!) 140/96 (BP Location: Left Arm, Cuff Size: Large)   Pulse 87   Temp 98.1 F (36.7 C) (Oral)   Ht 5\' 3"  (1.6 m)   Wt 242 lb 8 oz (110 kg)   SpO2 95%   BMI 42.96 kg/m  General: Awake, appears stated age Heart: RRR, no LE edema Lungs: CTAB, no rales, wheezes or rhonchi. No accessory muscle use Neuro: Gait is nml, DTR's equal and symmetric throughout, no clonus, no cerebella signs, 5/5 strength throughout MSK: No ttp over subocc triangle, TMJ, parasp msc in cerv region.  Psych: Age appropriate judgment and insight, normal affect and mood  Assessment and Plan: Acute migraine  Elevated blood pressure reading  Exacerbation of chronic issue. Toradol 60 mg IM today. Heat, ice, Tylenol. Monitor BP at home. Has been having lots of stress lately. There is a projected end to this at end of month.  The patient voiced understanding and agreement to the plan.  Jilda Roche [place], DO 10/23/22  11:50 AM

## 2022-10-30 ENCOUNTER — Encounter: Payer: Self-pay | Admitting: *Deleted

## 2022-10-31 ENCOUNTER — Other Ambulatory Visit (HOSPITAL_BASED_OUTPATIENT_CLINIC_OR_DEPARTMENT_OTHER): Payer: Self-pay

## 2022-11-28 ENCOUNTER — Other Ambulatory Visit: Payer: Self-pay | Admitting: Family Medicine

## 2022-11-28 ENCOUNTER — Other Ambulatory Visit (HOSPITAL_BASED_OUTPATIENT_CLINIC_OR_DEPARTMENT_OTHER): Payer: Self-pay

## 2022-11-28 MED ORDER — KETOCONAZOLE 2 % EX CREA
1.0000 | TOPICAL_CREAM | Freq: Every day | CUTANEOUS | 0 refills | Status: DC
  Filled 2022-11-28: qty 30, 30d supply, fill #0

## 2023-01-28 ENCOUNTER — Encounter: Payer: 59 | Admitting: Family Medicine

## 2023-01-29 ENCOUNTER — Other Ambulatory Visit (HOSPITAL_BASED_OUTPATIENT_CLINIC_OR_DEPARTMENT_OTHER): Payer: Self-pay

## 2023-02-14 ENCOUNTER — Encounter (INDEPENDENT_AMBULATORY_CARE_PROVIDER_SITE_OTHER): Payer: Self-pay

## 2023-02-20 ENCOUNTER — Encounter: Payer: 59 | Admitting: Family Medicine

## 2023-02-28 ENCOUNTER — Encounter: Payer: Self-pay | Admitting: Family Medicine

## 2023-02-28 ENCOUNTER — Ambulatory Visit (INDEPENDENT_AMBULATORY_CARE_PROVIDER_SITE_OTHER): Payer: 59 | Admitting: Family Medicine

## 2023-02-28 ENCOUNTER — Other Ambulatory Visit: Payer: Self-pay | Admitting: Family Medicine

## 2023-02-28 ENCOUNTER — Other Ambulatory Visit: Payer: Self-pay

## 2023-02-28 ENCOUNTER — Other Ambulatory Visit (HOSPITAL_BASED_OUTPATIENT_CLINIC_OR_DEPARTMENT_OTHER): Payer: Self-pay

## 2023-02-28 VITALS — BP 138/86 | HR 69 | Temp 98.7°F | Ht 63.5 in | Wt 247.5 lb

## 2023-02-28 DIAGNOSIS — Z0000 Encounter for general adult medical examination without abnormal findings: Secondary | ICD-10-CM | POA: Diagnosis not present

## 2023-02-28 DIAGNOSIS — E559 Vitamin D deficiency, unspecified: Secondary | ICD-10-CM | POA: Diagnosis not present

## 2023-02-28 LAB — VITAMIN D 25 HYDROXY (VIT D DEFICIENCY, FRACTURES): VITD: 7.13 ng/mL — ABNORMAL LOW (ref 30.00–100.00)

## 2023-02-28 LAB — COMPREHENSIVE METABOLIC PANEL
ALT: 12 U/L (ref 0–35)
AST: 15 U/L (ref 0–37)
Albumin: 3.9 g/dL (ref 3.5–5.2)
Alkaline Phosphatase: 37 U/L — ABNORMAL LOW (ref 39–117)
BUN: 12 mg/dL (ref 6–23)
CO2: 28 mEq/L (ref 19–32)
Calcium: 8.7 mg/dL (ref 8.4–10.5)
Chloride: 105 mEq/L (ref 96–112)
Creatinine, Ser: 0.65 mg/dL (ref 0.40–1.20)
GFR: 116.03 mL/min (ref >60.00)
Glucose, Bld: 87 mg/dL (ref 70–99)
Potassium: 3.7 mEq/L (ref 3.5–5.1)
Sodium: 137 mEq/L (ref 135–145)
Total Bilirubin: 0.7 mg/dL (ref 0.2–1.2)
Total Protein: 6.3 g/dL (ref 6.0–8.3)

## 2023-02-28 LAB — CBC
HCT: 40.1 % (ref 36.0–46.0)
Hemoglobin: 13.3 g/dL (ref 12.0–15.0)
MCHC: 33.1 g/dL (ref 30.0–36.0)
MCV: 93.9 fl (ref 78.0–100.0)
Platelets: 279 10*3/uL (ref 150.0–400.0)
RBC: 4.27 Mil/uL (ref 3.87–5.11)
RDW: 12.7 % (ref 11.5–15.5)
WBC: 5.5 10*3/uL (ref 4.0–10.5)

## 2023-02-28 LAB — LIPID PANEL
Cholesterol: 140 mg/dL (ref 0–200)
HDL: 45.5 mg/dL (ref >39.00)
LDL Cholesterol: 79 mg/dL (ref 0–99)
NonHDL: 94.94
Total CHOL/HDL Ratio: 3
Triglycerides: 80 mg/dL (ref 0.0–149.0)
VLDL: 16 mg/dL (ref 0.0–40.0)

## 2023-02-28 MED ORDER — VITAMIN D (ERGOCALCIFEROL) 1.25 MG (50000 UNIT) PO CAPS
50000.0000 [IU] | ORAL_CAPSULE | ORAL | 0 refills | Status: DC
  Filled 2023-02-28 – 2023-03-27 (×2): qty 12, 84d supply, fill #0

## 2023-02-28 NOTE — Patient Instructions (Addendum)
Give [place] 2-3 business days to get the results of your labs back.   Keep the diet clean and stay active.  Aim to do some physical exertion for 150 minutes per week. This is typically divided into 5 days per week, 30 minutes per day. The activity should be enough to get your heart rate up. Anything is better than nothing if you have time constraints.  Please get me a copy of your advanced directive form at your convenience.   I recommend getting the flu shot in mid October. This suggestion would change if the CDC comes out with a different recommendation.   Let us know if you need anything.

## 2023-02-28 NOTE — Progress Notes (Signed)
Chief Complaint  Patient presents with   Annual Exam     Well Woman Danielle Garcia is here for a complete physical.   Her last physical was >1 year ago.  Current diet: in general, diet could be better.  Current exercise: none. Contraception? Yes No LMP recorded. (Menstrual status: IUD). Fatigue out of ordinary? No Seatbelt? Yes Advanced directive? No  Health Maintenance Pap/HPV- Yes Tetanus- Yes HIV screening- Yes Hep C screening- Yes  Past Medical History:  Diagnosis Date   Anxiety    Asthma    as a child   Depression    doing ok now   Fibroid    Floaters    GERD (gastroesophageal reflux disease)    HTN (hypertension)    2014/15 no meds since then   Infection    UTI   Migraine    Pre-diabetes    Pulmonary embolus (HCC)    after first delivery     Past Surgical History:  Procedure Laterality Date   CESAREAN SECTION     CESAREAN SECTION N/A 09/07/2020   Procedure: CESAREAN SECTION;  Surgeon: Catalina Antigua, MD;  Location: [facility];  Service: Obstetrics;  Laterality: N/A;   CHOLECYSTECTOMY N/A 11/15/2021   Procedure: LAPAROSCOPIC CHOLECYSTECTOMY;  Surgeon: Axel Filler, MD;  Location: [facility];  Service: General;  Laterality: N/A;  90 MINS NEEDED ROOM 4 PATIENT IN BED 1302   DILATION AND EVACUATION N/A 07/02/2018   Procedure: DILATATION AND EVACUATION;  Surgeon: [place] Bing, MD;  Location: [facility];  Service: Gynecology;  Laterality: N/A;   INTRAOPERATIVE CHOLANGIOGRAM N/A 11/15/2021   Procedure: INTRAOPERATIVE CHOLANGIOGRAM;  Surgeon: Axel Filler, MD;  Location: [facility];  Service: General;  Laterality: N/A;   WISDOM TOOTH EXTRACTION      Medications  Current Outpatient Medications on File Prior to Visit  Medication Sig Dispense Refill   ketoconazole (NIZORAL) 2 % cream Apply 1 Application topically daily. 30 g 0   levonorgestrel (MIRENA) 20 MCG/DAY IUD 1 each by Intrauterine route once.     Allergies Allergies  Allergen Reactions   Reglan  [Metoclopramide] Other (See Comments)    Restlessness, agitationn    Review of Systems: Constitutional:  no unexpected weight changes Eye:  no recent significant change in vision Ear/Nose/Mouth/Throat:  Ears:  no tinnitus or vertigo and no recent change in hearing Nose/Mouth/Throat:  no complaints of nasal congestion, no sore throat Cardiovascular: no chest pain Respiratory:  no cough and no shortness of breath Gastrointestinal:  no abdominal pain, no change in bowel habits GU:  Female: negative for dysuria or pelvic pain Musculoskeletal/Extremities:  no pain of the joints Integumentary (Skin/Breast):  no abnormal skin lesions reported Neurologic:  no headaches Endocrine:  denies fatigue Hematologic/Lymphatic:  No areas of easy bleeding  Exam BP 138/86 (BP Location: Right Arm, Patient Position: Sitting, Cuff Size: Large)   Pulse 69   Temp 98.7 F (37.1 C) (Oral)   Ht 5' 3.5" (1.613 m)   Wt 247 lb 8 oz (112.3 kg)   SpO2 99%   BMI 43.15 kg/m  General:  well developed, well nourished, in no apparent distress Skin:  no significant moles, warts, or growths Head:  no masses, lesions, or tenderness Eyes:  pupils equal and round, sclera anicteric without injection Ears:  canals without lesions, TMs shiny without retraction, no obvious effusion, no erythema Nose:  nares patent, mucosa normal, and no drainage  Throat/Pharynx:  lips and gingiva without lesion; tongue and uvula midline; non-inflamed pharynx; no exudates  or postnasal drainage Neck: neck supple without adenopathy, thyromegaly, or masses Lungs:  clear to auscultation, breath sounds equal bilaterally, no respiratory distress Cardio:  regular rate and rhythm, no bruits, no LE edema Abdomen:  abdomen soft, nontender; bowel sounds normal; no masses or organomegaly Genital: Defer to GYN Musculoskeletal:  symmetrical muscle groups noted without atrophy or deformity Extremities:  no clubbing, cyanosis, or edema, no deformities,  no skin discoloration Neuro:  gait normal; deep tendon reflexes normal and symmetric Psych: well oriented with normal range of affect and appropriate judgment/insight  Assessment and Plan  Well adult exam - Plan: CBC, Comprehensive metabolic panel, Lipid panel  Vitamin D deficiency - Plan: VITAMIN D 25 Hydroxy (Vit-D Deficiency, Fractures)   Well 32 y.o. female. Counseled on diet and exercise. Consider exercising in the AM.  Advanced directive form provided today.  Other orders as above. Follow up in 1 yr. The patient voiced understanding and agreement to the plan.  Jilda Roche [place], DO 02/28/23 7:33 AM

## 2023-03-15 ENCOUNTER — Other Ambulatory Visit (HOSPITAL_BASED_OUTPATIENT_CLINIC_OR_DEPARTMENT_OTHER): Payer: Self-pay

## 2023-03-21 ENCOUNTER — Ambulatory Visit (INDEPENDENT_AMBULATORY_CARE_PROVIDER_SITE_OTHER): Payer: 59 | Admitting: Family Medicine

## 2023-03-21 DIAGNOSIS — G43909 Migraine, unspecified, not intractable, without status migrainosus: Secondary | ICD-10-CM

## 2023-03-21 MED ORDER — KETOROLAC TROMETHAMINE 60 MG/2ML IM SOLN
60.0000 mg | Freq: Once | INTRAMUSCULAR | Status: AC
Start: 2023-03-21 — End: 2023-03-21
  Administered 2023-03-21: 60 mg via INTRAMUSCULAR

## 2023-03-21 NOTE — Progress Notes (Signed)
Patient here today for Toradol 60 mg per Dr. Carmelia Roller for Migraine Given right Gluteal Muscle. Pt tolerated well

## 2023-03-26 ENCOUNTER — Other Ambulatory Visit (HOSPITAL_COMMUNITY)
Admission: RE | Admit: 2023-03-26 | Discharge: 2023-03-26 | Disposition: A | Discharge status: Home / Self Care | Payer: 59 | Source: Ambulatory Visit | Attending: Obstetrics and Gynecology | Admitting: Obstetrics and Gynecology

## 2023-03-26 ENCOUNTER — Ambulatory Visit (INDEPENDENT_AMBULATORY_CARE_PROVIDER_SITE_OTHER): Payer: 59

## 2023-03-26 VITALS — BP 124/75 | HR 69 | Wt 247.0 lb

## 2023-03-26 DIAGNOSIS — N898 Other specified noninflammatory disorders of vagina: Secondary | ICD-10-CM | POA: Insufficient documentation

## 2023-03-26 DIAGNOSIS — N760 Acute vaginitis: Secondary | ICD-10-CM

## 2023-03-26 NOTE — Progress Notes (Signed)
SUBJECTIVE:  32 y.o. female complains of white vaginal discharge for 1 week(s). Denies abnormal vaginal bleeding or significant pelvic pain or fever. No UTI symptoms. Denies history of known exposure to STD.  No LMP recorded. (Menstrual status: IUD).  OBJECTIVE:  She appears well, afebrile. Urine dipstick: not done.  ASSESSMENT:  Vaginal Discharge     PLAN:  GC, chlamydia, trichomonas, BVAG, CVAG probe sent to lab. Treatment: To be determined once lab results are received ROV prn if symptoms persist or worsen.   Danielle Garcia l Hieu Herms, CMA

## 2023-03-27 ENCOUNTER — Other Ambulatory Visit (HOSPITAL_BASED_OUTPATIENT_CLINIC_OR_DEPARTMENT_OTHER): Payer: Self-pay

## 2023-03-27 ENCOUNTER — Other Ambulatory Visit: Payer: Self-pay

## 2023-03-27 DIAGNOSIS — N760 Acute vaginitis: Secondary | ICD-10-CM

## 2023-03-27 LAB — CERVICOVAGINAL ANCILLARY ONLY
Bacterial Vaginitis (gardnerella): POSITIVE — AB
Candida Glabrata: NEGATIVE
Candida Vaginitis: NEGATIVE
Chlamydia: NEGATIVE
Comment: NEGATIVE
Comment: NEGATIVE
Comment: NEGATIVE
Comment: NEGATIVE
Comment: NEGATIVE
Comment: NORMAL
Neisseria Gonorrhea: NEGATIVE
Trichomonas: NEGATIVE

## 2023-03-27 MED ORDER — METRONIDAZOLE 500 MG PO TABS
500.0000 mg | ORAL_TABLET | Freq: Two times a day (BID) | ORAL | 0 refills | Status: DC
  Filled 2023-03-27: qty 14, 7d supply, fill #0

## 2023-03-27 MED ORDER — METRONIDAZOLE 500 MG PO TABS
500.0000 mg | ORAL_TABLET | Freq: Two times a day (BID) | ORAL | 0 refills | Status: DC
Start: 2023-03-27 — End: 2023-03-27
  Filled 2023-03-27: qty 14, 7d supply, fill #0

## 2023-03-27 NOTE — Progress Notes (Signed)
Patient sent message requesting medication for BV. Flagyl 500 1 tablet PO BID x 7 days was sent to her pharmacy. Understanding was voiced. Hosie Sharman l Kayler Buckholtz, CMA

## 2023-03-27 NOTE — Addendum Note (Signed)
Addended by: Milas Hock A on: 03/27/2023 04:50 PM   Modules accepted: Orders

## 2023-04-10 ENCOUNTER — Encounter: Payer: Self-pay | Admitting: Family Medicine

## 2023-04-10 ENCOUNTER — Telehealth (INDEPENDENT_AMBULATORY_CARE_PROVIDER_SITE_OTHER): Payer: 59 | Admitting: Family Medicine

## 2023-04-10 ENCOUNTER — Other Ambulatory Visit (HOSPITAL_BASED_OUTPATIENT_CLINIC_OR_DEPARTMENT_OTHER): Payer: Self-pay

## 2023-04-10 DIAGNOSIS — F339 Major depressive disorder, recurrent, unspecified: Secondary | ICD-10-CM | POA: Insufficient documentation

## 2023-04-10 DIAGNOSIS — F411 Generalized anxiety disorder: Secondary | ICD-10-CM | POA: Diagnosis not present

## 2023-04-10 MED ORDER — CITALOPRAM HYDROBROMIDE 10 MG PO TABS
10.0000 mg | ORAL_TABLET | Freq: Every day | ORAL | 3 refills | Status: DC
Start: 2023-04-10 — End: 2023-04-11
  Filled 2023-04-10: qty 30, 30d supply, fill #0

## 2023-04-10 MED ORDER — PROPRANOLOL HCL 10 MG PO TABS
ORAL_TABLET | ORAL | 1 refills | Status: DC
  Filled 2023-04-10: qty 30, 10d supply, fill #0
  Filled 2023-05-21: qty 30, 10d supply, fill #1

## 2023-04-10 NOTE — Progress Notes (Signed)
CC: Anxiety/depression  Subjective Danielle Garcia presents for f/u anxiety/depression. We are interacting via web portal for an electronic face-to-face visit. I verified patient's ID using 2 identifiers. Patient agreed to proceed with visit via this method. Patient is in her car, I am at office. Patient and I are present for visit.   Pt is currently being treated with nothing.  Reports doing poorly over past 3 weeks. Danielle Garcia is sick. Has been going on for  No thoughts of harming self or others. No self-medication with alcohol, prescription drugs or illicit drugs. Pt is not following with a counselor/psychologist.  Past Medical History:  Diagnosis Date   Anxiety    Asthma    as a child   Depression    doing ok now   Fibroid    Floaters    GERD (gastroesophageal reflux disease)    HTN (hypertension)    2014/15 no meds since then   Infection    UTI   Migraine    Pre-diabetes    Pulmonary embolus (HCC)    after first delivery   Allergies as of 04/10/2023       Reactions   Reglan [metoclopramide] Other (See Comments)   Restlessness, agitationn        Medication List        Accurate as of April 10, 2023 11:38 AM. If you have any questions, ask your nurse or doctor.          STOP taking these medications    ketoconazole 2 % cream Commonly known as: NIZORAL Stopped by: Sharlene Dory   Vitamin D (Ergocalciferol) 1.25 MG (50000 UNIT) Caps capsule Commonly known as: DRISDOL Stopped by: Sharlene Dory       TAKE these medications    citalopram 10 MG tablet Commonly known as: CELEXA Take 1 tablet (10 mg total) by mouth daily. Started by: Sharlene Dory   levonorgestrel 20 MCG/DAY Iud Commonly known as: MIRENA 1 each by Intrauterine route once.   metroNIDAZOLE 500 MG tablet Commonly known as: FLAGYL Take 1 tablet (500 mg total) by mouth 2 (two) times daily.   Ozempic (1 MG/DOSE) 2 MG/1.5ML Sopn Generic drug: Semaglutide (1  MG/DOSE) Inject into the skin.   propranolol 10 MG tablet Commonly known as: INDERAL Take 1 tab by mouth 3 times daily as needed for anxiety. Started by: Sharlene Dory        Exam No conversational dyspnea Age appropriate judgment and insight Nml affect and mood  Assessment and Plan  GAD (generalized anxiety disorder) - Plan: citalopram (CELEXA) 10 MG tablet, propranolol (INDERAL) 10 MG tablet  Depression, recurrent (HCC) - Plan: citalopram (CELEXA) 10 MG tablet  Chronic, unstable/exacerbation. Start Celexa 10 mg/d. Start propranolol 10 mg TID prn. Counseling resources provided.  F/u in 6 weeks. The patient voiced understanding and agreement to the plan.  Danielle Roche [place], DO 04/10/23 11:38 AM

## 2023-04-11 ENCOUNTER — Other Ambulatory Visit: Payer: Self-pay | Admitting: Family Medicine

## 2023-04-11 ENCOUNTER — Other Ambulatory Visit (HOSPITAL_BASED_OUTPATIENT_CLINIC_OR_DEPARTMENT_OTHER): Payer: Self-pay

## 2023-04-11 DIAGNOSIS — F339 Major depressive disorder, recurrent, unspecified: Secondary | ICD-10-CM

## 2023-04-11 DIAGNOSIS — F411 Generalized anxiety disorder: Secondary | ICD-10-CM

## 2023-04-11 MED ORDER — CITALOPRAM HYDROBROMIDE 10 MG PO TABS
10.0000 mg | ORAL_TABLET | Freq: Every day | ORAL | 3 refills | Status: DC
  Filled 2023-04-11 – 2023-05-21 (×2): qty 30, 30d supply, fill #0
  Filled 2023-07-16: qty 30, 30d supply, fill #1

## 2023-04-26 ENCOUNTER — Ambulatory Visit: Payer: 59 | Admitting: Psychology

## 2023-04-26 DIAGNOSIS — F4323 Adjustment disorder with mixed anxiety and depressed mood: Secondary | ICD-10-CM
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
## 2023-05-15 ENCOUNTER — Ambulatory Visit (INDEPENDENT_AMBULATORY_CARE_PROVIDER_SITE_OTHER): Payer: 59 | Admitting: Psychology

## 2023-05-15 DIAGNOSIS — F4323 Adjustment disorder with mixed anxiety and depressed mood: Secondary | ICD-10-CM

## 2023-05-15 NOTE — Progress Notes (Signed)
[facility] Counselor/Therapist Progress Note  Patient ID: Danielle Garcia, MRN: [id number],    Date: 05/15/2023  Time Spent: 3:00pm-3:50pm   50 minutes   Treatment Type: Individual Therapy  Reported Symptoms: stress  Mental Status Exam: Appearance:  Casual     Behavior: Appropriate  Motor: Normal  Speech/Language:  Normal Rate  Affect: Appropriate  Mood: normal  Thought process: normal  Thought content:   WNL  Sensory/Perceptual disturbances:   WNL  Orientation: oriented to person, place, time/date, and situation  Attention: Good  Concentration: Good  Memory: WNL  Fund of knowledge:  Good  Insight:   Good  Judgment:  Good  Impulse Control: Good   Risk Assessment: Danger to Self:  No Self-injurious Behavior: No Danger to Others: No Duty to Warn:no Physical Aggression / Violence:No  Access to Firearms a concern: No  Gang Involvement:No   Subjective: Pt present for face-to-face individual therapy via video.  Pt consents to telehealth video session and is aware of limitations and benefits of virtual sessions.  Location of pt: home Location of therapist: home office.   Pt talked about her grandmother who is in hospice.   Pt is worried about her and about not having much time left with her.  Pt tries to visit her every day.   Helped pt process her feelings and anticipatory grief. Pt talked about her kids.   Addressed the challenges of parenting her 75 yo daughter [name] who has been lying.     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   [name] is very active and her father [name] does not come to see [name] so pt is overwhelmed with single parenting.  Worked on self care strategies. Provided supportive therapy.    Interventions: Cognitive Behavioral Therapy and Insight-Oriented  Diagnosis:  F43.23  Plan of Care: Recommend ongoing therapy.  Pt participated in setting treatment goals.  Pt wants to find balance in life and  improve coping skills and self care.   Plan to meet every two weeks.   Pt agrees with treatment plan.  Treatment Plan Client Abilities/Strengths  Pt is bright, engaging, and motivated for therapy.  Client Treatment Preferences  Individual therapy.  Client Statement of Needs  Improve copings skills and self care. Symptoms  Depressed or irritable mood. Excessive and/or unrealistic worry that is difficult to control occurring more days than not for at least 6 months about a number of events or activities. Hypervigilance (e.g., feeling constantly on edge, experiencing concentration difficulties, having trouble falling or staying asleep, exhibiting a general state of irritability).  Problems Addressed  Unipolar Depression, Anxiety Goals 1. Alleviate depressive symptoms and return to previous level of effective functioning. 2. Appropriately grieve the loss in order to normalize mood and to return to previously adaptive level of functioning. Objective Learn and implement behavioral strategies to overcome depression. Target Date: 2024-04-25 Frequency: Biweekly  Progress: 10 Modality: individual  Related Interventions Assist the client in developing skills that increase the likelihood of deriving pleasure from behavioral activation (e.g., assertiveness skills, developing an exercise plan, less internal/more external focus, increased social involvement); reinforce success. Engage the client in "behavioral activation," increasing his/her activity level and contact with sources of reward, while identifying processes that inhibit activation. use behavioral techniques such as instruction, rehearsal, role-playing, role reversal, as needed, to facilitate activity in the client's daily life; reinforce success. 3. Develop healthy interpersonal relationships that lead to the alleviation and help prevent the relapse of  depression. 4. Develop healthy thinking patterns and beliefs about self, others, and the world  that lead to the alleviation and help prevent the relapse of depression. 5. Enhance ability to effectively cope with the full variety of life's worries and anxieties. 6. Learn and implement coping skills that result in a reduction of anxiety and worry, and improved daily functioning. Objective Learn and implement problem-solving strategies for realistically addressing worries. Target Date: 2024-04-25 Frequency: Biweekly  Progress: 10 Modality: individual  Related Interventions Assign the client a homework exercise in which he/she problem-solves a current problem (see Mastery of Your Anxiety and Worry: Workbook by Elenora Fender and Filbert Schilder or Generalized Anxiety Disorder by Elesa Hacker, and Filbert Schilder); review, reinforce success, and provide corrective feedback toward improvement. Teach the client problem-solving strategies involving specifically defining a problem, generating options for addressing it, evaluating the pros and cons of each option, selecting and implementing an optional action, and reevaluating and refining the action. Objective Learn and implement calming skills to reduce overall anxiety and manage anxiety symptoms. Target Date: 2024-04-25 Frequency: Biweekly  Progress: 10 Modality: individual  Related Interventions Assign the client to read about progressive muscle relaxation and other calming strategies in relevant books or treatment manuals (e.g., Progressive Relaxation Training by Twana First; Mastery of Your Anxiety and Worry: Workbook by Earlie Counts). Assign the client homework each session in which he/she practices relaxation exercises daily, gradually applying them progressively from non-anxiety-provoking to anxiety-provoking situations; review and reinforce success while providing corrective feedback toward improvement. Teach the client calming/relaxation skills (e.g., applied relaxation, progressive muscle relaxation, cue controlled relaxation; mindful breathing;  biofeedback) and how to discriminate better between relaxation and tension; teach the client how to apply these skills to his/her daily life. 7. Recognize, accept, and cope with feelings of depression. 8. Reduce overall frequency, intensity, and duration of the anxiety so that daily functioning is not impaired. 9. Resolve the core conflict that is the source of anxiety. 10. Stabilize anxiety level while increasing ability to function on a daily basis. Diagnosis Axis none F43.21 (Adjustment Disorder, With depressed mood)  Adjustment Disorder, With Depressed Mood   Axis none F43.22 (Adjustment Disorder, With anxiety)  Adjustment Disorder, With Anxiety   Conditions For Discharge Achievement of treatment goals and objectives   Salomon Fick, LCSW

## 2023-05-21 ENCOUNTER — Other Ambulatory Visit: Payer: Self-pay | Admitting: Family Medicine

## 2023-05-21 ENCOUNTER — Other Ambulatory Visit: Payer: Self-pay

## 2023-05-21 ENCOUNTER — Other Ambulatory Visit (HOSPITAL_BASED_OUTPATIENT_CLINIC_OR_DEPARTMENT_OTHER): Payer: Self-pay

## 2023-05-21 MED ORDER — OZEMPIC (1 MG/DOSE) 4 MG/3ML ~~LOC~~ SOPN
1.0000 mg | PEN_INJECTOR | SUBCUTANEOUS | 1 refills | Status: DC
  Filled 2023-05-21: qty 3, 28d supply, fill #0
  Filled 2023-07-02: qty 3, 28d supply, fill #1

## 2023-05-30 ENCOUNTER — Ambulatory Visit: Payer: 59 | Admitting: Psychology

## 2023-05-30 DIAGNOSIS — F4323 Adjustment disorder with mixed anxiety and depressed mood: Secondary | ICD-10-CM

## 2023-05-30 NOTE — Progress Notes (Signed)
[facility] Counselor/Therapist Progress Note  Patient ID: Danielle Garcia, MRN: [id number],    Date: 05/30/2023  Time Spent: 12:00pm-12:45pm   45 minutes   Treatment Type: Individual Therapy  Reported Symptoms: stress  Mental Status Exam: Appearance:  Casual     Behavior: Appropriate  Motor: Normal  Speech/Language:  Normal Rate  Affect: Appropriate  Mood: normal  Thought process: normal  Thought content:   WNL  Sensory/Perceptual disturbances:   WNL  Orientation: oriented to person, place, time/date, and situation  Attention: Good  Concentration: Good  Memory: WNL  Fund of knowledge:  Good  Insight:   Good  Judgment:  Good  Impulse Control: Good   Risk Assessment: Danger to Self:  No Self-injurious Behavior: No Danger to Others: No Duty to Warn:no Physical Aggression / Violence:No  Access to Firearms a concern: No  Gang Involvement:No   Subjective: Pt present for face-to-face individual therapy via video.  Pt consents to telehealth video session and is aware of limitations and benefits of virtual sessions.  Location of pt: home Location of therapist: home office.   Pt talked about her grandmother who is in hospice.    Pt is planning a family get together for her grandmother bc her grandmother wants to see everyone since it could be the last time.  This is sad for pt.  Helped pt process her feelings and anticipatory grief. Pt talked about her kids.   Addressed the challenges of parenting her 56 yo daughter Danielle Garcia who has been continuing to lie.    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   Danielle Garcia is very active and her father Danielle Garcia has still not come to see Danielle Garcia so pt is overwhelmed with single parenting.  Pt states she has felt down bc her boyfriend has not been "showing up for her.   When pt is upset she withdraws and does not talk to her boyfriend.  She realizes this is not good for her or the relationship.   Worked  with pt on how she can communicate her feelings effectively.    Worked on self care strategies. Provided supportive therapy.    Interventions: Cognitive Behavioral Therapy and Insight-Oriented  Diagnosis:  F43.23  Plan of Care: Recommend ongoing therapy.  Pt participated in setting treatment goals.  Pt wants to find balance in life and improve coping skills and self care.   Plan to meet every two weeks.   Pt agrees with treatment plan.  Treatment Plan Client Abilities/Strengths  Pt is bright, engaging, and motivated for therapy.  Client Treatment Preferences  Individual therapy.  Client Statement of Needs  Improve copings skills and self care. Symptoms  Depressed or irritable mood. Excessive and/or unrealistic worry that is difficult to control occurring more days than not for at least 6 months about a number of events or activities. Hypervigilance (e.g., feeling constantly on edge, experiencing concentration difficulties, having trouble falling or staying asleep, exhibiting a general state of irritability).  Problems Addressed  Unipolar Depression, Anxiety Goals 1. Alleviate depressive symptoms and return to previous level of effective functioning. 2. Appropriately grieve the loss in order to normalize mood and to return to previously adaptive level of functioning. Objective Learn and implement behavioral strategies to overcome depression. Target Date: 2024-04-25 Frequency: Biweekly  Progress: 10 Modality: individual  Related Interventions Assist the client in developing skills that increase the likelihood of deriving pleasure from behavioral activation (e.g., assertiveness skills, developing an exercise plan,  less internal/more external focus, increased social involvement); reinforce success. Engage the client in "behavioral activation," increasing his/her activity level and contact with sources of reward, while identifying processes that inhibit activation. use behavioral techniques  such as instruction, rehearsal, role-playing, role reversal, as needed, to facilitate activity in the client's daily life; reinforce success. 3. Develop healthy interpersonal relationships that lead to the alleviation and help prevent the relapse of depression. 4. Develop healthy thinking patterns and beliefs about self, others, and the world that lead to the alleviation and help prevent the relapse of depression. 5. Enhance ability to effectively cope with the full variety of life's worries and anxieties. 6. Learn and implement coping skills that result in a reduction of anxiety and worry, and improved daily functioning. Objective Learn and implement problem-solving strategies for realistically addressing worries. Target Date: 2024-04-25 Frequency: Biweekly  Progress: 10 Modality: individual  Related Interventions Assign the client a homework exercise in which he/she problem-solves a current problem (see Mastery of Your Anxiety and Worry: Workbook by Danielle Garcia and Danielle Garcia or Generalized Anxiety Disorder by Danielle Garcia, and Danielle Garcia); review, reinforce success, and provide corrective feedback toward improvement. Teach the client problem-solving strategies involving specifically defining a problem, generating options for addressing it, evaluating the pros and cons of each option, selecting and implementing an optional action, and reevaluating and refining the action. Objective Learn and implement calming skills to reduce overall anxiety and manage anxiety symptoms. Target Date: 2024-04-25 Frequency: Biweekly  Progress: 10 Modality: individual  Related Interventions Assign the client to read about progressive muscle relaxation and other calming strategies in relevant books or treatment manuals (e.g., Progressive Relaxation Training by Danielle Garcia; Mastery of Your Anxiety and Worry: Workbook by Danielle Garcia). Assign the client homework each session in which he/she practices relaxation  exercises daily, gradually applying them progressively from non-anxiety-provoking to anxiety-provoking situations; review and reinforce success while providing corrective feedback toward improvement. Teach the client calming/relaxation skills (e.g., applied relaxation, progressive muscle relaxation, cue controlled relaxation; mindful breathing; biofeedback) and how to discriminate better between relaxation and tension; teach the client how to apply these skills to his/her daily life. 7. Recognize, accept, and cope with feelings of depression. 8. Reduce overall frequency, intensity, and duration of the anxiety so that daily functioning is not impaired. 9. Resolve the core conflict that is the source of anxiety. 10. Stabilize anxiety level while increasing ability to function on a daily basis. Diagnosis Axis none F43.21 (Adjustment Disorder, With depressed mood)  Adjustment Disorder, With Depressed Mood   Axis none F43.22 (Adjustment Disorder, With anxiety)  Adjustment Disorder, With Anxiety   Conditions For Discharge Achievement of treatment goals and objectives   Salomon Fick, LCSW

## 2023-06-06 ENCOUNTER — Ambulatory Visit: Payer: 59 | Admitting: Psychology

## 2023-06-06 DIAGNOSIS — F4323 Adjustment disorder with mixed anxiety and depressed mood: Secondary | ICD-10-CM | POA: Diagnosis not present

## 2023-06-06 NOTE — Progress Notes (Signed)
[facility] Counselor/Therapist Progress Note  Patient ID: Danielle Garcia, MRN: [id number],    Date: 06/06/2023  Time Spent: 12:00pm-12:45pm   45 minutes   Treatment Type: Individual Therapy  Reported Symptoms: stress  Mental Status Exam: Appearance:  Casual     Behavior: Appropriate  Motor: Normal  Speech/Language:  Normal Rate  Affect: Appropriate  Mood: normal  Thought process: normal  Thought content:   WNL  Sensory/Perceptual disturbances:   WNL  Orientation: oriented to person, place, time/date, and situation  Attention: Good  Concentration: Good  Memory: WNL  Fund of knowledge:  Good  Insight:   Good  Judgment:  Good  Impulse Control: Good   Risk Assessment: Danger to Self:  No Self-injurious Behavior: No Danger to Others: No Duty to Warn:no Physical Aggression / Violence:No  Access to Firearms a concern: No  Gang Involvement:No   Subjective: Pt present for face-to-face individual therapy via video.  Pt consents to telehealth video session and is aware of limitations and benefits of virtual sessions.  Location of pt: home Location of therapist: home office.   Pt talked about her grandmother who is in hospice.    Pt had the party for her last weekend and it went well.   Pt worries about her grandmother and is experiencing anticipatory grief.  Helped pt process her feelings. Pt broke up with her boyfriend.   He was not staying in touch with her and pt was tired of his excuses.  Addressed the relationship dynamics.   Pt is disappointed but would rather be alone than in a relationship that is not working. Pt states she feels down at times.   She feels overwhelmed with single parenting.   Pt moves into a new apartment on Jan. 4th.  Pt feels she needs a fresh start and new environment.  Pt's family will help her move.     Worked on self care strategies. Provided supportive therapy.    Interventions: Cognitive Behavioral Therapy and  Insight-Oriented  Diagnosis:  F43.23  Plan of Care: Recommend ongoing therapy.  Pt participated in setting treatment goals.  Pt wants to find balance in life and improve coping skills and self care.   Plan to meet every two weeks.   Pt agrees with treatment plan.  Treatment Plan Client Abilities/Strengths  Pt is bright, engaging, and motivated for therapy.  Client Treatment Preferences  Individual therapy.  Client Statement of Needs  Improve copings skills and self care. Symptoms  Depressed or irritable mood. Excessive and/or unrealistic worry that is difficult to control occurring more days than not for at least 6 months about a number of events or activities. Hypervigilance (e.g., feeling constantly on edge, experiencing concentration difficulties, having trouble falling or staying asleep, exhibiting a general state of irritability).  Problems Addressed  Unipolar Depression, Anxiety Goals 1. Alleviate depressive symptoms and return to previous level of effective functioning. 2. Appropriately grieve the loss in order to normalize mood and to return to previously adaptive level of functioning. Objective Learn and implement behavioral strategies to overcome depression. Target Date: 2024-04-25 Frequency: Biweekly  Progress: 10 Modality: individual  Related Interventions Assist the client in developing skills that increase the likelihood of deriving pleasure from behavioral activation (e.g., assertiveness skills, developing an exercise plan, less internal/more external focus, increased social involvement); reinforce success. Engage the client in "behavioral activation," increasing his/her activity level and contact with sources of reward, while identifying processes that inhibit activation. use behavioral techniques such as instruction, rehearsal, role-playing,  role reversal, as needed, to facilitate activity in the client's daily life; reinforce success. 3. Develop healthy interpersonal  relationships that lead to the alleviation and help prevent the relapse of depression. 4. Develop healthy thinking patterns and beliefs about self, others, and the world that lead to the alleviation and help prevent the relapse of depression. 5. Enhance ability to effectively cope with the full variety of life's worries and anxieties. 6. Learn and implement coping skills that result in a reduction of anxiety and worry, and improved daily functioning. Objective Learn and implement problem-solving strategies for realistically addressing worries. Target Date: 2024-04-25 Frequency: Biweekly  Progress: 10 Modality: individual  Related Interventions Assign the client a homework exercise in which he/she problem-solves a current problem (see Mastery of Your Anxiety and Worry: Workbook by Elenora Fender and Filbert Schilder or Generalized Anxiety Disorder by Elesa Hacker, and Filbert Schilder); review, reinforce success, and provide corrective feedback toward improvement. Teach the client problem-solving strategies involving specifically defining a problem, generating options for addressing it, evaluating the pros and cons of each option, selecting and implementing an optional action, and reevaluating and refining the action. Objective Learn and implement calming skills to reduce overall anxiety and manage anxiety symptoms. Target Date: 2024-04-25 Frequency: Biweekly  Progress: 10 Modality: individual  Related Interventions Assign the client to read about progressive muscle relaxation and other calming strategies in relevant books or treatment manuals (e.g., Progressive Relaxation Training by Twana First; Mastery of Your Anxiety and Worry: Workbook by Earlie Counts). Assign the client homework each session in which he/she practices relaxation exercises daily, gradually applying them progressively from non-anxiety-provoking to anxiety-provoking situations; review and reinforce success while providing corrective feedback  toward improvement. Teach the client calming/relaxation skills (e.g., applied relaxation, progressive muscle relaxation, cue controlled relaxation; mindful breathing; biofeedback) and how to discriminate better between relaxation and tension; teach the client how to apply these skills to his/her daily life. 7. Recognize, accept, and cope with feelings of depression. 8. Reduce overall frequency, intensity, and duration of the anxiety so that daily functioning is not impaired. 9. Resolve the core conflict that is the source of anxiety. 10. Stabilize anxiety level while increasing ability to function on a daily basis. Diagnosis Axis none F43.21 (Adjustment Disorder, With depressed mood)  Adjustment Disorder, With Depressed Mood   Axis none F43.22 (Adjustment Disorder, With anxiety)  Adjustment Disorder, With Anxiety   Conditions For Discharge Achievement of treatment goals and objectives   Salomon Fick, LCSW

## 2023-06-29 ENCOUNTER — Ambulatory Visit (INDEPENDENT_AMBULATORY_CARE_PROVIDER_SITE_OTHER): Payer: 59 | Admitting: Psychology

## 2023-06-29 DIAGNOSIS — F4323 Adjustment disorder with mixed anxiety and depressed mood: Secondary | ICD-10-CM

## 2023-06-29 NOTE — Progress Notes (Signed)
Danielle Garcia Counselor/Therapist Progress Note  Patient ID: Danielle Garcia, MRN: [id number],    Date: 06/29/2023  Time Spent: 12:00pm-12:45pm   45 minutes   Treatment Type: Individual Therapy  Reported Symptoms: stress  Mental Status Exam: Appearance:  Casual     Behavior: Appropriate  Motor: Normal  Speech/Language:  Normal Rate  Affect: Appropriate  Mood: normal  Thought process: normal  Thought content:   WNL  Sensory/Perceptual disturbances:   WNL  Orientation: oriented to person, place, time/date, and situation  Attention: Good  Concentration: Good  Memory: WNL  Fund of knowledge:  Good  Insight:   Good  Judgment:  Good  Impulse Control: Good   Risk Assessment: Danger to Self:  No Self-injurious Behavior: No Danger to Others: No Duty to Warn:no Physical Aggression / Violence:No  Access to Firearms a concern: No  Gang Involvement:No   Subjective: Pt present for face-to-face individual therapy via video.  Pt consents to telehealth video session and is aware of limitations and benefits of virtual sessions.  Location of pt: home Location of therapist: home office.   Pt talked about work.   She likes her job and it is not too stressful.   Pt talked about her kids.  [name] finally took [name] for a weekend which was helpful for pt.   [name] is behaving better.  Pt has challenges with [name] regarding visitation.  Addressed the challenges.   Pt is getting ready to move into a new apartment Jan 4th.   Pt feels stress about the move.  Worked on [employer], dispensing. Pt's grandmother is still in hospice.  Pt worries about her.   Pt talked about having a date with a new guy tonight.   Pt is frustrated that he does not always follow through with things.     Worked on self care strategies. Provided supportive therapy.    Interventions: Cognitive Behavioral Therapy and Insight-Oriented  Diagnosis:  F43.23  Plan of Care: Recommend ongoing therapy.   Pt participated in setting treatment goals.  Pt wants to find balance in life and improve coping skills and self care.   Plan to meet every two weeks.   Pt agrees with treatment plan.  Treatment Plan Client Abilities/Strengths  Pt is bright, engaging, and motivated for therapy.  Client Treatment Preferences  Individual therapy.  Client Statement of Needs  Improve copings skills and self care. Symptoms  Depressed or irritable mood. Excessive and/or unrealistic worry that is difficult to control occurring more days than not for at least 6 months about a number of events or activities. Hypervigilance (e.g., feeling constantly on edge, experiencing concentration difficulties, having trouble falling or staying asleep, exhibiting a general state of irritability).  Problems Addressed  Unipolar Depression, Anxiety Goals 1. Alleviate depressive symptoms and return to previous level of effective functioning. 2. Appropriately grieve the loss in order to normalize mood and to return to previously adaptive level of functioning. Objective Learn and implement behavioral strategies to overcome depression. Target Date: 2024-04-25 Frequency: Biweekly  Progress: 10 Modality: individual  Related Interventions Assist the client in developing skills that increase the likelihood of deriving pleasure from behavioral activation (e.g., assertiveness skills, developing an exercise plan, less internal/more external focus, increased social involvement); reinforce success. Engage the client in "behavioral activation," increasing his/her activity level and contact with sources of reward, while identifying processes that inhibit activation. use behavioral techniques such as instruction, rehearsal, role-playing, role reversal, as needed, to facilitate activity in the client's daily life;  reinforce success. 3. Develop healthy interpersonal relationships that lead to the alleviation and help prevent the relapse of  depression. 4. Develop healthy thinking patterns and beliefs about self, others, and the world that lead to the alleviation and help prevent the relapse of depression. 5. Enhance ability to effectively cope with the full variety of life's worries and anxieties. 6. Learn and implement coping skills that result in a reduction of anxiety and worry, and improved daily functioning. Objective Learn and implement problem-solving strategies for realistically addressing worries. Target Date: 2024-04-25 Frequency: Biweekly  Progress: 10 Modality: individual  Related Interventions Assign the client a homework exercise in which he/she problem-solves a current problem (see Mastery of Your Anxiety and Worry: Workbook by Elenora Fender and Filbert Schilder or Generalized Anxiety Disorder by Elesa Hacker, and Filbert Schilder); review, reinforce success, and provide corrective feedback toward improvement. Teach the client problem-solving strategies involving specifically defining a problem, generating options for addressing it, evaluating the pros and cons of each option, selecting and implementing an optional action, and reevaluating and refining the action. Objective Learn and implement calming skills to reduce overall anxiety and manage anxiety symptoms. Target Date: 2024-04-25 Frequency: Biweekly  Progress: 10 Modality: individual  Related Interventions Assign the client to read about progressive muscle relaxation and other calming strategies in relevant books or treatment manuals (e.g., Progressive Relaxation Training by Twana First; Mastery of Your Anxiety and Worry: Workbook by Earlie Counts). Assign the client homework each session in which he/she practices relaxation exercises daily, gradually applying them progressively from non-anxiety-provoking to anxiety-provoking situations; review and reinforce success while providing corrective feedback toward improvement. Teach the client calming/relaxation skills (e.g.,  applied relaxation, progressive muscle relaxation, cue controlled relaxation; mindful breathing; biofeedback) and how to discriminate better between relaxation and tension; teach the client how to apply these skills to his/her daily life. 7. Recognize, accept, and cope with feelings of depression. 8. Reduce overall frequency, intensity, and duration of the anxiety so that daily functioning is not impaired. 9. Resolve the core conflict that is the source of anxiety. 10. Stabilize anxiety level while increasing ability to function on a daily basis. Diagnosis Axis none F43.21 (Adjustment Disorder, With depressed mood)  Adjustment Disorder, With Depressed Mood   Axis none F43.22 (Adjustment Disorder, With anxiety)  Adjustment Disorder, With Anxiety   Conditions For Discharge Achievement of treatment goals and objectives   Salomon Fick, LCSW

## 2023-07-02 ENCOUNTER — Other Ambulatory Visit (HOSPITAL_BASED_OUTPATIENT_CLINIC_OR_DEPARTMENT_OTHER): Payer: Self-pay

## 2023-07-02 ENCOUNTER — Ambulatory Visit (INDEPENDENT_AMBULATORY_CARE_PROVIDER_SITE_OTHER): Payer: 59

## 2023-07-02 ENCOUNTER — Other Ambulatory Visit (HOSPITAL_COMMUNITY)
Admission: RE | Admit: 2023-07-02 | Discharge: 2023-07-02 | Disposition: A | Discharge status: Home / Self Care | Payer: 59 | Source: Ambulatory Visit | Attending: Family Medicine | Admitting: Family Medicine

## 2023-07-02 ENCOUNTER — Telehealth: Payer: Self-pay

## 2023-07-02 VITALS — BP 121/78 | HR 66 | Wt 251.0 lb

## 2023-07-02 DIAGNOSIS — N760 Acute vaginitis: Secondary | ICD-10-CM

## 2023-07-02 DIAGNOSIS — N898 Other specified noninflammatory disorders of vagina: Secondary | ICD-10-CM

## 2023-07-02 MED ORDER — METRONIDAZOLE 500 MG PO TABS
500.0000 mg | ORAL_TABLET | Freq: Two times a day (BID) | ORAL | 0 refills | Status: DC
Start: 2023-07-02 — End: 2023-07-13
  Filled 2023-07-02: qty 14, 7d supply, fill #0

## 2023-07-02 NOTE — Telephone Encounter (Signed)
Patient called requesting an appt for self swab. Patient c/o vaginal odor and vaginal discharge. Patient offered treatment for bv, patient declined. Patient offered first available appt on 12/27. Patient declines.

## 2023-07-02 NOTE — Progress Notes (Signed)
SUBJECTIVE:  32 y.o. female complains of clear vaginal discharge for 2 day(s). Denies abnormal vaginal bleeding or significant pelvic pain or fever. No UTI symptoms. Denies history of known exposure to STD.  No LMP recorded. (Menstrual status: IUD).  OBJECTIVE:  She appears well, afebrile. Urine dipstick: not done.  ASSESSMENT:  Vaginal Discharge  Vaginal Odor   PLAN:  GC, chlamydia, trichomonas, BVAG, CVAG probe sent to lab. Treatment: To be determined once lab results are received ROV prn if symptoms persist or worsen.

## 2023-07-03 ENCOUNTER — Other Ambulatory Visit (HOSPITAL_BASED_OUTPATIENT_CLINIC_OR_DEPARTMENT_OTHER): Payer: Self-pay

## 2023-07-04 ENCOUNTER — Other Ambulatory Visit: Payer: Self-pay | Admitting: Family Medicine

## 2023-07-04 LAB — CERVICOVAGINAL ANCILLARY ONLY
Bacterial Vaginitis (gardnerella): POSITIVE — AB
Candida Glabrata: NEGATIVE
Candida Vaginitis: NEGATIVE
Chlamydia: NEGATIVE
Comment: NEGATIVE
Comment: NEGATIVE
Comment: NEGATIVE
Comment: NEGATIVE
Comment: NEGATIVE
Comment: NORMAL
Neisseria Gonorrhea: NEGATIVE
Trichomonas: NEGATIVE

## 2023-07-13 ENCOUNTER — Encounter: Payer: Self-pay | Admitting: Family Medicine

## 2023-07-13 ENCOUNTER — Other Ambulatory Visit (HOSPITAL_BASED_OUTPATIENT_CLINIC_OR_DEPARTMENT_OTHER): Payer: Self-pay

## 2023-07-13 ENCOUNTER — Telehealth (INDEPENDENT_AMBULATORY_CARE_PROVIDER_SITE_OTHER): Payer: 59 | Admitting: Family Medicine

## 2023-07-13 DIAGNOSIS — F411 Generalized anxiety disorder: Secondary | ICD-10-CM | POA: Diagnosis not present

## 2023-07-13 MED ORDER — CLONAZEPAM 0.5 MG PO TABS
0.2500 mg | ORAL_TABLET | Freq: Two times a day (BID) | ORAL | 1 refills | Status: DC | PRN
  Filled 2023-07-13: qty 30, 15d supply, fill #0

## 2023-07-13 NOTE — Progress Notes (Signed)
CC: F/u  Subjective Danielle Garcia presents for f/u anxiety. We are interacting via web portal for an electronic face-to-face visit. I verified patient's ID using 2 identifiers. Patient agreed to proceed with visit via this method. Patient is at work, I am at office. Patient and I are present for visit.   Pt is currently being treated with Celexa 10 mg/d. She has been taking propranolol 10 mg TID prn. It was helpful initially.  Reports she was doing well since treatment. [name] is in hospice, she is moving; having more stress and anxiety flares. Propranolol no longer working.  No thoughts of harming self or others. No self-medication with alcohol, prescription drugs or illicit drugs. Pt is following with a counselor/psychologist.  Past Medical History:  Diagnosis Date   Anxiety    Asthma    as a child   Depression    doing ok now   Fibroid    Floaters    GERD (gastroesophageal reflux disease)    HTN (hypertension)    2014/15 no meds since then   Infection    UTI   Migraine    Pre-diabetes    Pulmonary embolus (HCC)    after first delivery   Allergies as of 07/13/2023       Reactions   Reglan [metoclopramide] Other (See Comments)   Restlessness, agitationn        Medication List        Accurate as of July 13, 2023 12:36 PM. If you have any questions, ask your nurse or doctor.          STOP taking these medications    metroNIDAZOLE 500 MG tablet Commonly known as: FLAGYL Stopped by: Jilda Roche Caylin Nass   propranolol 10 MG tablet Commonly known as: INDERAL Stopped by: Sharlene Dory       TAKE these medications    citalopram 10 MG tablet Commonly known as: CELEXA Take 1 tablet (10 mg total) by mouth daily.   clonazePAM 0.5 MG tablet Commonly known as: KLONOPIN Take 0.5-1 tablets (0.25-0.5 mg total) by mouth 2 (two) times daily as needed for anxiety. Started by: Sharlene Dory   levonorgestrel 20 MCG/DAY Iud Commonly  known as: MIRENA 1 each by Intrauterine route once.   Ozempic (1 MG/DOSE) 4 MG/3ML Sopn Generic drug: Semaglutide (1 MG/DOSE) Inject 1 mg into the skin once a week.        Exam No conversational dyspnea Age appropriate judgment and insight Nml affect and mood  Assessment and Plan  GAD (generalized anxiety disorder) - Plan: clonazePAM (KLONOPIN) 0.5 MG tablet  Chronic, uncontrolled. Cont Celexa 10 mg/d. Stop propranolol. Start Klonopin 0.25-0.5 mg bid prn. Warned about benzo meds. Cont w counseling team. Offered to increase Celexa which she politely declined.  F/u prn. The patient voiced understanding and agreement to the plan.  Jilda Roche Biehle, DO 07/13/23 12:36 PM

## 2023-07-25 ENCOUNTER — Other Ambulatory Visit (HOSPITAL_BASED_OUTPATIENT_CLINIC_OR_DEPARTMENT_OTHER): Payer: Self-pay

## 2023-07-25 DIAGNOSIS — J069 Acute upper respiratory infection, unspecified: Secondary | ICD-10-CM | POA: Diagnosis not present

## 2023-07-25 DIAGNOSIS — J029 Acute pharyngitis, unspecified: Secondary | ICD-10-CM | POA: Diagnosis not present

## 2023-07-25 DIAGNOSIS — R059 Cough, unspecified: Secondary | ICD-10-CM | POA: Diagnosis not present

## 2023-07-25 MED ORDER — BENZONATATE 100 MG PO CAPS
100.0000 mg | ORAL_CAPSULE | Freq: Three times a day (TID) | ORAL | 0 refills | Status: DC
  Filled 2023-07-25: qty 20, 7d supply, fill #0

## 2023-07-27 ENCOUNTER — Emergency Department (HOSPITAL_BASED_OUTPATIENT_CLINIC_OR_DEPARTMENT_OTHER)
Admission: EM | Admit: 2023-07-27 | Discharge: 2023-07-27 | Disposition: A | Discharge status: Home / Self Care | Payer: 59 | Attending: Emergency Medicine | Admitting: Emergency Medicine

## 2023-07-27 ENCOUNTER — Other Ambulatory Visit: Payer: Self-pay

## 2023-07-27 ENCOUNTER — Encounter (HOSPITAL_BASED_OUTPATIENT_CLINIC_OR_DEPARTMENT_OTHER): Payer: Self-pay | Admitting: Emergency Medicine

## 2023-07-27 DIAGNOSIS — R059 Cough, unspecified: Secondary | ICD-10-CM | POA: Insufficient documentation

## 2023-07-27 DIAGNOSIS — R0602 Shortness of breath: Secondary | ICD-10-CM | POA: Diagnosis not present

## 2023-07-27 DIAGNOSIS — Z794 Long term (current) use of insulin: Secondary | ICD-10-CM | POA: Diagnosis not present

## 2023-07-27 DIAGNOSIS — J111 Influenza due to unidentified influenza virus with other respiratory manifestations: Secondary | ICD-10-CM

## 2023-07-27 DIAGNOSIS — M791 Myalgia, unspecified site: Secondary | ICD-10-CM | POA: Insufficient documentation

## 2023-07-27 MED ORDER — ALBUTEROL SULFATE HFA 108 (90 BASE) MCG/ACT IN AERS
2.0000 | INHALATION_SPRAY | RESPIRATORY_TRACT | Status: DC | PRN
  Administered 2023-07-27: 2 via RESPIRATORY_TRACT
  Filled 2023-07-27: qty 6.7

## 2023-07-27 NOTE — ED Triage Notes (Signed)
 Pt reports SHOB, cough since Wednesday. Daughter has flu. Reports hx of asthma.

## 2023-07-27 NOTE — ED Provider Notes (Signed)
 [facility] Provider Note   CSN: [id number] Arrival date & time: 07/27/23  0139     History  Chief Complaint  Patient presents with   Shortness of Breath    Danielle Garcia is a 33 y.o. female.  Patient is a 33 year old female presenting with complaints of cough and shortness of breath.  She is here along with her daughter who was recently diagnosed with influenza A.  She denies fevers or chills, but does report body aches.  No aggravating or alleviating factors.  The history is provided by the patient.       Home Medications Prior to Admission medications   Medication Sig Start Date End Date Taking? Authorizing Provider  benzonatate  (TESSALON ) 100 MG capsule Take one capsule (100 mg dose) by mouth 3 (three) times a day as needed for Cough for up to 7 days. 07/25/23     citalopram  (CELEXA ) 10 MG tablet Take 1 tablet (10 mg total) by mouth daily. 04/11/23   Frann Mabel Mt, DO  clonazePAM  (KLONOPIN ) 0.5 MG tablet Take 0.5-1 tablets (0.25-0.5 mg total) by mouth 2 (two) times daily as needed for anxiety. 07/13/23   Frann Mabel Mt, DO  levonorgestrel  (MIRENA ) 20 MCG/DAY IUD 1 each by Intrauterine route once.    [provider]  Semaglutide , 1 MG/DOSE, (OZEMPIC , 1 MG/DOSE,) 4 MG/3ML SOPN Inject 1 mg into the skin once a week. 05/21/23   Frann Mabel Mt, DO      Allergies    Reglan  [metoclopramide ]    Review of Systems   Review of Systems  All other systems reviewed and are negative.   Physical Exam Updated Vital Signs BP (!) 135/93   Pulse 72   Temp 98.3 F (36.8 C) (Oral)   Resp 19   SpO2 97%  Physical Exam Vitals and nursing note reviewed.  Constitutional:      General: She is not in acute distress.    Appearance: She is well-developed. She is not diaphoretic.  HENT:     Head: Normocephalic and atraumatic.  Cardiovascular:     Rate and Rhythm: Normal rate and regular rhythm.     Heart  sounds: No murmur heard.    No friction rub. No gallop.  Pulmonary:     Effort: Pulmonary effort is normal. No respiratory distress.     Breath sounds: Normal breath sounds. No wheezing.  Abdominal:     General: Bowel sounds are normal. There is no distension.     Palpations: Abdomen is soft.     Tenderness: There is no abdominal tenderness.  Musculoskeletal:        General: Normal range of motion.     Cervical back: Normal range of motion and neck supple.  Skin:    General: Skin is warm and dry.  Neurological:     General: No focal deficit present.     Mental Status: She is alert and oriented to person, place, and time.     ED Results / Procedures / Treatments   Labs (all labs ordered are listed, but only abnormal results are displayed) Labs Reviewed - No data to display  EKG None  Radiology No results found.  Procedures Procedures    Medications Ordered in ED Medications  albuterol  (VENTOLIN  HFA) 108 (90 Base) MCG/ACT inhaler 2 puff (has no administration in time range)    ED Course/ Medical Decision Making/ A&P  Patient presenting with URI symptoms in the setting of influenza A exposure  from her 110-year-old daughter.  Patient arrives with stable vital signs and is afebrile.  Oxygen  saturations are 97%, respiratory rate is 19, and lungs are clear.  There is no respiratory distress.  Mom is inquiring about a breathing treatment, however I hear no wheezing and do not feel as though this would be helpful.  I will give an albuterol  MDI she can use at home if she begins wheezing.  To return as needed if symptoms worsen or change.  Final Clinical Impression(s) / ED Diagnoses Final diagnoses:  None    Rx / DC Orders ED Discharge Orders     None         Geroldine Berg, MD 07/27/23 [phone]

## 2023-07-27 NOTE — Discharge Instructions (Signed)
 Use the albuterol inhaler, 2 puffs every 4 hours as needed for wheezing/difficulty breathing.  Drink plenty of fluids and get plenty of rest.  Take over-the-counter medications as needed for relief of symptoms.

## 2023-07-31 ENCOUNTER — Other Ambulatory Visit (HOSPITAL_BASED_OUTPATIENT_CLINIC_OR_DEPARTMENT_OTHER): Payer: Self-pay

## 2023-07-31 ENCOUNTER — Other Ambulatory Visit: Payer: Self-pay | Admitting: Family Medicine

## 2023-07-31 MED ORDER — PROMETHAZINE-DM 6.25-15 MG/5ML PO SYRP
5.0000 mL | ORAL_SOLUTION | Freq: Four times a day (QID) | ORAL | 0 refills | Status: DC | PRN
  Filled 2023-07-31: qty 118, 6d supply, fill #0

## 2023-08-06 ENCOUNTER — Ambulatory Visit: Payer: 59 | Admitting: Psychology

## 2023-08-06 DIAGNOSIS — F4323 Adjustment disorder with mixed anxiety and depressed mood: Secondary | ICD-10-CM

## 2023-08-06 NOTE — Progress Notes (Signed)
[facility] Counselor/Therapist Progress Note  Patient ID: Danielle Garcia, MRN: [id number],    Date: 08/06/2023  Time Spent: 12:00pm-12:45pm   45 minutes   Treatment Type: Individual Therapy  Reported Symptoms: stress  Mental Status Exam: Appearance:  Casual     Behavior: Appropriate  Motor: Normal  Speech/Language:  Normal Rate  Affect: Appropriate  Mood: normal  Thought process: normal  Thought content:   WNL  Sensory/Perceptual disturbances:   WNL  Orientation: oriented to person, place, time/date, and situation  Attention: Good  Concentration: Good  Memory: WNL  Fund of knowledge:  Good  Insight:   Good  Judgment:  Good  Impulse Control: Good   Risk Assessment: Danger to Self:  No Self-injurious Behavior: No Danger to Others: No Duty to Warn:no Physical Aggression / Violence:No  Access to Firearms a concern: No  Gang Involvement:No   Subjective: Pt present for face-to-face individual therapy via video.  Pt consents to telehealth video session and is aware of limitations and benefits of virtual sessions.  Location of pt: home Location of therapist: home office.   Pt talked about feeling annoyed and overwhelmed today.  She is also tired. Pt's grandmother is getting worse.  She receives Hospice services at home.  Pt is very close to her grandmother and talks to her a few times a day.   Pt is experiencing anticipatory grief.   Helped pt process her feelings and grief.  Pt moved into her new apartment this month.  The move was hard but she is very glad to be there.   There is more space and it is one level.  Janey Greaser is sleeping in her own bed there.  Pt talked about the guy she is dating.  They get along well when they are together but when he goes out of town pt does not hear from him.   Addressed how this impacts pt and how she can effectively communicate her needs to him.    Worked on self care strategies. Provided supportive therapy.     Interventions: Cognitive Behavioral Therapy and Insight-Oriented  Diagnosis:  F43.23  Plan of Care: Recommend ongoing therapy.  Pt participated in setting treatment goals.  Pt wants to find balance in life and improve coping skills and self care.   Plan to meet every two weeks.   Pt agrees with treatment plan.  Treatment Plan Client Abilities/Strengths  Pt is bright, engaging, and motivated for therapy.  Client Treatment Preferences  Individual therapy.  Client Statement of Needs  Improve copings skills and self care. Symptoms  Depressed or irritable mood. Excessive and/or unrealistic worry that is difficult to control occurring more days than not for at least 6 months about a number of events or activities. Hypervigilance (e.g., feeling constantly on edge, experiencing concentration difficulties, having trouble falling or staying asleep, exhibiting a general state of irritability).  Problems Addressed  Unipolar Depression, Anxiety Goals 1. Alleviate depressive symptoms and return to previous level of effective functioning. 2. Appropriately grieve the loss in order to normalize mood and to return to previously adaptive level of functioning. Objective Learn and implement behavioral strategies to overcome depression. Target Date: 2024-04-25 Frequency: Biweekly  Progress: 10 Modality: individual  Related Interventions Assist the client in developing skills that increase the likelihood of deriving pleasure from behavioral activation (e.g., assertiveness skills, developing an exercise plan, less internal/more external focus, increased social involvement); reinforce success. Engage the client in "behavioral activation," increasing his/her activity level and contact with sources of  reward, while identifying processes that inhibit activation. use behavioral techniques such as instruction, rehearsal, role-playing, role reversal, as needed, to facilitate activity in the client's daily life;  reinforce success. 3. Develop healthy interpersonal relationships that lead to the alleviation and help prevent the relapse of depression. 4. Develop healthy thinking patterns and beliefs about self, others, and the world that lead to the alleviation and help prevent the relapse of depression. 5. Enhance ability to effectively cope with the full variety of life's worries and anxieties. 6. Learn and implement coping skills that result in a reduction of anxiety and worry, and improved daily functioning. Objective Learn and implement problem-solving strategies for realistically addressing worries. Target Date: 2024-04-25 Frequency: Biweekly  Progress: 10 Modality: individual  Related Interventions Assign the client a homework exercise in which he/she problem-solves a current problem (see Mastery of Your Anxiety and Worry: Workbook by Elenora Fender and Filbert Schilder or Generalized Anxiety Disorder by Elesa Hacker, and Filbert Schilder); review, reinforce success, and provide corrective feedback toward improvement. Teach the client problem-solving strategies involving specifically defining a problem, generating options for addressing it, evaluating the pros and cons of each option, selecting and implementing an optional action, and reevaluating and refining the action. Objective Learn and implement calming skills to reduce overall anxiety and manage anxiety symptoms. Target Date: 2024-04-25 Frequency: Biweekly  Progress: 10 Modality: individual  Related Interventions Assign the client to read about progressive muscle relaxation and other calming strategies in relevant books or treatment manuals (e.g., Progressive Relaxation Training by Twana First; Mastery of Your Anxiety and Worry: Workbook by Earlie Counts). Assign the client homework each session in which he/she practices relaxation exercises daily, gradually applying them progressively from non-anxiety-provoking to anxiety-provoking situations; review and  reinforce success while providing corrective feedback toward improvement. Teach the client calming/relaxation skills (e.g., applied relaxation, progressive muscle relaxation, cue controlled relaxation; mindful breathing; biofeedback) and how to discriminate better between relaxation and tension; teach the client how to apply these skills to his/her daily life. 7. Recognize, accept, and cope with feelings of depression. 8. Reduce overall frequency, intensity, and duration of the anxiety so that daily functioning is not impaired. 9. Resolve the core conflict that is the source of anxiety. 10. Stabilize anxiety level while increasing ability to function on a daily basis. Diagnosis Axis none F43.21 (Adjustment Disorder, With depressed mood)  Adjustment Disorder, With Depressed Mood   Axis none F43.22 (Adjustment Disorder, With anxiety)  Adjustment Disorder, With Anxiety   Conditions For Discharge Achievement of treatment goals and objectives   Salomon Fick, LCSW

## 2023-08-09 ENCOUNTER — Other Ambulatory Visit (HOSPITAL_COMMUNITY)
Admission: RE | Admit: 2023-08-09 | Discharge: 2023-08-09 | Disposition: A | Discharge status: Home / Self Care | Payer: 59 | Source: Ambulatory Visit | Attending: Family Medicine | Admitting: Family Medicine

## 2023-08-09 ENCOUNTER — Ambulatory Visit (INDEPENDENT_AMBULATORY_CARE_PROVIDER_SITE_OTHER): Payer: 59 | Admitting: Family Medicine

## 2023-08-09 ENCOUNTER — Encounter: Payer: Self-pay | Admitting: Family Medicine

## 2023-08-09 VITALS — BP 140/81 | HR 65 | Ht 63.0 in | Wt 252.0 lb

## 2023-08-09 DIAGNOSIS — Z1151 Encounter for screening for human papillomavirus (HPV): Secondary | ICD-10-CM | POA: Diagnosis not present

## 2023-08-09 DIAGNOSIS — Z30431 Encounter for routine checking of intrauterine contraceptive device: Secondary | ICD-10-CM

## 2023-08-09 DIAGNOSIS — N898 Other specified noninflammatory disorders of vagina: Secondary | ICD-10-CM

## 2023-08-09 DIAGNOSIS — Z01419 Encounter for gynecological examination (general) (routine) without abnormal findings: Secondary | ICD-10-CM | POA: Insufficient documentation

## 2023-08-09 NOTE — Progress Notes (Signed)
ANNUAL EXAM Patient name: Danielle Garcia MRN [id number]  Date of birth: 1990-09-10 Chief Complaint:   Annual Exam  History of Present Illness:   Danielle Garcia is a 33 y.o.  [phone]  female  being seen today for a routine annual exam.  Current complaints: none. No problems with IUD  Patient's last menstrual period was 08/06/2023 (exact date).    Last pap 02/2020. Results were: NILM w/ HRHPV negative. H/O abnormal pap: no Last mammogram: n/a     08/09/2023    9:14 AM 02/28/2023    7:18 AM 01/30/2022    1:02 PM 10/11/2021    2:32 PM 05/21/2020    8:04 AM  Depression screen PHQ 2/9  Decreased Interest 1 0 0 0 0  Down, Depressed, Hopeless 1 0 0 0 0  PHQ - 2 Score 2 0 0 0 0  Altered sleeping 2 0 1    Tired, decreased energy 1 0 1    Change in appetite 1 0     Feeling bad or failure about yourself  0 0 0    Trouble concentrating 1 0 0    Moving slowly or fidgety/restless 0 0 0    Suicidal thoughts 0 0 0    PHQ-9 Score 7 0 2    Difficult doing work/chores  Not difficult at all Not difficult at all          08/09/2023    9:15 AM  GAD 7 : Generalized Anxiety Score  Nervous, Anxious, on Edge 1  Control/stop worrying 1  Worry too much - different things 2  Trouble relaxing 1  Restless 1  Easily annoyed or irritable 3  Afraid - awful might happen 1  Total GAD 7 Score 10     Review of Systems:   Pertinent items are noted in HPI Denies any headaches, blurred vision, fatigue, shortness of breath, chest pain, abdominal pain, abnormal vaginal discharge/itching/odor/irritation, problems with periods, bowel movements, urination, or intercourse unless otherwise stated above. Pertinent History Reviewed:  Reviewed past medical,surgical, social and family history.  Reviewed problem list, medications and allergies. Physical Assessment:   Vitals:   08/09/23 0910  BP: (!) 140/81  Pulse: 65  Weight: 252 lb (114.3 kg)  Height: 5\' 3"  (1.6 m)  Body mass index is 44.64 kg/m.         Physical Examination:   General appearance - well appearing, and in no distress  Mental status - alert, oriented to person, place, and time  Psych:  She has a normal mood and affect  Skin - warm and dry, normal color, no suspicious lesions noted  Chest - effort normal, all lung fields clear to auscultation bilaterally  Heart - normal rate and regular rhythm  Neck:  midline trachea, no thyromegaly or nodules  Breasts - breasts appear normal, no suspicious masses, no skin or nipple changes or axillary nodes  Abdomen - soft, nontender, nondistended, no masses or organomegaly  Pelvic - VULVA: normal appearing vulva with no masses, tenderness or lesions  VAGINA: normal appearing vagina with normal color and discharge, no lesions  CERVIX: normal appearing cervix without discharge or lesions, no CMT  Thin prep pap is done with HR HPV cotesting  IUD strings seen  UTERUS: uterus is felt to be normal size, shape, consistency and nontender   ADNEXA: No adnexal masses or tenderness noted.  Extremities:  No swelling or varicosities noted  Chaperone present for exam  Assessment & Plan:  1. Well woman exam  with routine gynecological exam (Primary) No concerns  2. IUD check up Strings seen   No orders of the defined types were placed in this encounter.   Meds: No orders of the defined types were placed in this encounter.   Follow-up: No follow-ups on file.  Levie Heritage, DO 08/09/2023 4:27 PM

## 2023-08-13 ENCOUNTER — Other Ambulatory Visit (HOSPITAL_COMMUNITY)
Admission: RE | Admit: 2023-08-13 | Discharge: 2023-08-13 | Disposition: A | Discharge status: Home / Self Care | Payer: 59 | Source: Ambulatory Visit | Attending: Family Medicine | Admitting: Family Medicine

## 2023-08-13 DIAGNOSIS — N898 Other specified noninflammatory disorders of vagina: Secondary | ICD-10-CM | POA: Insufficient documentation

## 2023-08-13 NOTE — Addendum Note (Signed)
Addended by: Levie Heritage on: 08/13/2023 03:56 PM   Modules accepted: Orders

## 2023-08-14 ENCOUNTER — Other Ambulatory Visit (HOSPITAL_BASED_OUTPATIENT_CLINIC_OR_DEPARTMENT_OTHER): Payer: Self-pay

## 2023-08-14 ENCOUNTER — Encounter: Payer: Self-pay | Admitting: Family Medicine

## 2023-08-14 LAB — CERVICOVAGINAL ANCILLARY ONLY
Bacterial Vaginitis (gardnerella): POSITIVE — AB
Candida Glabrata: NEGATIVE
Candida Vaginitis: NEGATIVE
Chlamydia: NEGATIVE
Comment: NEGATIVE
Comment: NEGATIVE
Comment: NEGATIVE
Comment: NEGATIVE
Comment: NEGATIVE
Comment: NORMAL
Neisseria Gonorrhea: NEGATIVE
Trichomonas: NEGATIVE

## 2023-08-14 MED ORDER — METRONIDAZOLE 1.3 % VA GEL
1.0000 | Freq: Every day | VAGINAL | 0 refills | Status: AC
  Filled 2023-08-14: qty 5, 1d supply, fill #0

## 2023-08-14 NOTE — Addendum Note (Signed)
Addended by: Levie Heritage on: 08/14/2023 04:59 PM   Modules accepted: Orders

## 2023-08-16 LAB — CYTOLOGY - PAP
Adequacy: ABSENT
Comment: NEGATIVE
Diagnosis: NEGATIVE
High risk HPV: NEGATIVE

## 2023-08-17 ENCOUNTER — Encounter: Payer: Self-pay | Admitting: Family Medicine

## 2023-08-17 ENCOUNTER — Telehealth (INDEPENDENT_AMBULATORY_CARE_PROVIDER_SITE_OTHER): Payer: 59 | Admitting: Family Medicine

## 2023-08-17 DIAGNOSIS — F411 Generalized anxiety disorder: Secondary | ICD-10-CM | POA: Diagnosis not present

## 2023-08-17 MED ORDER — CITALOPRAM HYDROBROMIDE 20 MG PO TABS: 20.0000 mg | ORAL_TABLET | Freq: Every day | ORAL | 3 refills | Status: DC

## 2023-08-17 NOTE — Progress Notes (Signed)
CC: Fatigue  Subjective: Patient is a 33 y.o. female here for fatigue. We are interacting via web portal for an electronic face-to-face visit. I verified patient's ID using 2 identifiers. Patient agreed to proceed with visit via this method. Patient is in a parked car, I am at office. Patient and I are present for visit.   Pt is going thru a lot of stress right now. Her grandmother is having ailing health and requiring her care quite a bit. Her teenage daughter is also challenging her quite a bit. Her focus is affected at work. She is anxious and having fatigue. Compliant w Celexa 10 mg/d, no AE's. Has been taking Klonopin more freq (~4 times in the past week). She does follow w a Veterinary surgeon.  No SI or HI. No self medication.   Past Medical History:  Diagnosis Date   Anxiety    Asthma    as a child   Depression    doing ok now   Fibroid    Floaters    GERD (gastroesophageal reflux disease)    HTN (hypertension)    2014/15 no meds since then   Infection    UTI   Migraine    Pre-diabetes    Pulmonary embolus (HCC)    after first delivery    Objective: No conversational dyspnea Age appropriate judgment and insight Nml affect and mood  Assessment and Plan: GAD (generalized anxiety disorder) - Plan: citalopram (CELEXA) 20 MG tablet  Chronic, uncontrolled. Increase Celexa from 10 mg/d to 20 mg/d. Cont Klonopin prn. FMLA for 2 weeks. F/u in 1 mo.  The patient voiced understanding and agreement to the plan.  Jilda Roche [place], DO 08/17/23  5:02 PM

## 2023-08-21 ENCOUNTER — Other Ambulatory Visit (HOSPITAL_BASED_OUTPATIENT_CLINIC_OR_DEPARTMENT_OTHER): Payer: Self-pay

## 2023-08-22 ENCOUNTER — Other Ambulatory Visit (HOSPITAL_BASED_OUTPATIENT_CLINIC_OR_DEPARTMENT_OTHER): Payer: Self-pay

## 2023-08-22 MED ORDER — METRONIDAZOLE 500 MG PO TABS
500.0000 mg | ORAL_TABLET | Freq: Two times a day (BID) | ORAL | 0 refills | Status: DC
  Filled 2023-08-22 – 2023-09-12 (×2): qty 14, 7d supply, fill #0

## 2023-08-24 ENCOUNTER — Other Ambulatory Visit (HOSPITAL_BASED_OUTPATIENT_CLINIC_OR_DEPARTMENT_OTHER): Payer: Self-pay

## 2023-08-28 ENCOUNTER — Other Ambulatory Visit: Payer: Self-pay | Admitting: Family Medicine

## 2023-08-28 DIAGNOSIS — G43109 Migraine with aura, not intractable, without status migrainosus: Secondary | ICD-10-CM

## 2023-08-28 MED ORDER — RIZATRIPTAN BENZOATE 5 MG PO TBDP: 5.0000 mg | ORAL_TABLET | ORAL | Status: DC | PRN

## 2023-09-03 ENCOUNTER — Other Ambulatory Visit (HOSPITAL_BASED_OUTPATIENT_CLINIC_OR_DEPARTMENT_OTHER): Payer: Self-pay

## 2023-09-03 ENCOUNTER — Ambulatory Visit: Payer: 59 | Admitting: Psychology

## 2023-09-03 DIAGNOSIS — F4323 Adjustment disorder with mixed anxiety and depressed mood: Secondary | ICD-10-CM

## 2023-09-03 NOTE — Progress Notes (Addendum)
 [facility] Counselor/Therapist Progress Note  Patient ID: Danielle Garcia, MRN: [id number],    Date: 09/03/2023  Time Spent: 12:00pm-12:45pm   45 minutes   Treatment Type: Individual Therapy  Reported Symptoms: stress  Mental Status Exam: Appearance:  Casual     Behavior: Appropriate  Motor: Normal  Speech/Language:  Normal Rate  Affect: Appropriate  Mood: normal  Thought process: normal  Thought content:   WNL  Sensory/Perceptual disturbances:   WNL  Orientation: oriented to person, place, time/date, and situation  Attention: Good  Concentration: Good  Memory: WNL  Fund of knowledge:  Good  Insight:   Good  Judgment:  Good  Impulse Control: Good   Risk Assessment: Danger to Self:  No Self-injurious Behavior: No Danger to Others: No Duty to Warn:no Physical Aggression / Violence:No  Access to Firearms a concern: No  Gang Involvement:No   Subjective: Pt present for face-to-face individual therapy via video.  Pt consents to telehealth video session and is aware of limitations and benefits of virtual sessions.  Location of pt: home Location of therapist: home office.   Pt talked about being out of work for 2 weeks on Danielle Garcia.  Pt had been feeling very overwhelmed and stressed so her doctor wrote pt out of work for two weeks.  Pt will go back to work next Monday.   Pt feels like she needed time to herself and time to regroup.   Pt's grandmother is getting worse.  She receives Hospice services at home.  Pt is very close to her grandmother and talks to her a few times a day.   Pt is experiencing anticipatory grief.   Helped pt process her feelings and grief.  Pt states her grandmother has been mean at times and pt had to talk to her about it.   Pt states [name] took [name] this past weekend which helped pt have a break.   Pt and [name] go to court about visitation next month.  Addressed pt's frustrations with Krisett's father.   Pt talked about her  relationship with her guy friend.  The last time pt saw him she felt very unwelcome and unwanted.  Addressed how this impacted pt.  Helped pt process her feelings and relationship dynamics.  Worked on self care strategies. Provided supportive therapy.    Interventions: Cognitive Behavioral Therapy and Insight-Oriented  Diagnosis:  F43.23  Plan of Care: Recommend ongoing therapy.  Pt participated in setting treatment goals.  Pt wants to find balance in life and improve coping skills and self care.   Plan to meet every two weeks.   Pt agrees with treatment plan.  Treatment Plan Client Abilities/Strengths  Pt is bright, engaging, and motivated for therapy.  Client Treatment Preferences  Individual therapy.  Client Statement of Needs  Improve copings skills and self care. Symptoms  Depressed or irritable mood. Excessive and/or unrealistic worry that is difficult to control occurring more days than not for at least 6 months about a number of events or activities. Hypervigilance (e.g., feeling constantly on edge, experiencing concentration difficulties, having trouble falling or staying asleep, exhibiting a general state of irritability).  Problems Addressed  Unipolar Depression, Anxiety Goals 1. Alleviate depressive symptoms and return to previous level of effective functioning. 2. Appropriately grieve the loss in order to normalize mood and to return to previously adaptive level of functioning. Objective Learn and implement behavioral strategies to overcome depression. Target Date: 2024-04-25 Frequency: Biweekly  Progress: 10 Modality: individual  Related Interventions Assist  the client in developing skills that increase the likelihood of deriving pleasure from behavioral activation (e.g., assertiveness skills, developing an exercise plan, less internal/more external focus, increased social involvement); reinforce success. Engage the client in "behavioral activation," increasing his/her  activity level and contact with sources of reward, while identifying processes that inhibit activation. use behavioral techniques such as instruction, rehearsal, role-playing, role reversal, as needed, to facilitate activity in the client's daily life; reinforce success. 3. Develop healthy interpersonal relationships that lead to the alleviation and help prevent the relapse of depression. 4. Develop healthy thinking patterns and beliefs about self, others, and the world that lead to the alleviation and help prevent the relapse of depression. 5. Enhance ability to effectively cope with the full variety of life's worries and anxieties. 6. Learn and implement coping skills that result in a reduction of anxiety and worry, and improved daily functioning. Objective Learn and implement problem-solving strategies for realistically addressing worries. Target Date: 2024-04-25 Frequency: Biweekly  Progress: 10 Modality: individual  Related Interventions Assign the client a homework exercise in which he/she problem-solves a current problem (see Mastery of Your Anxiety and Worry: Workbook by Elenora Fender and Filbert Schilder or Generalized Anxiety Disorder by Elesa Hacker, and Filbert Schilder); review, reinforce success, and provide corrective feedback toward improvement. Teach the client problem-solving strategies involving specifically defining a problem, generating options for addressing it, evaluating the pros and cons of each option, selecting and implementing an optional action, and reevaluating and refining the action. Objective Learn and implement calming skills to reduce overall anxiety and manage anxiety symptoms. Target Date: 2024-04-25 Frequency: Biweekly  Progress: 10 Modality: individual  Related Interventions Assign the client to read about progressive muscle relaxation and other calming strategies in relevant books or treatment manuals (e.g., Progressive Relaxation Training by Twana First; Mastery of Your  Anxiety and Worry: Workbook by Earlie Counts). Assign the client homework each session in which he/she practices relaxation exercises daily, gradually applying them progressively from non-anxiety-provoking to anxiety-provoking situations; review and reinforce success while providing corrective feedback toward improvement. Teach the client calming/relaxation skills (e.g., applied relaxation, progressive muscle relaxation, cue controlled relaxation; mindful breathing; biofeedback) and how to discriminate better between relaxation and tension; teach the client how to apply these skills to his/her daily life. 7. Recognize, accept, and cope with feelings of depression. 8. Reduce overall frequency, intensity, and duration of the anxiety so that daily functioning is not impaired. 9. Resolve the core conflict that is the source of anxiety. 10. Stabilize anxiety level while increasing ability to function on a daily basis. Diagnosis F43,23  Conditions For Discharge Achievement of treatment goals and objectives   Salomon Fick, LCSW

## 2023-09-10 ENCOUNTER — Telehealth: Payer: Self-pay | Admitting: Family Medicine

## 2023-09-10 NOTE — Telephone Encounter (Signed)
 Pt faxed in document to be filled out by provider ( FMLA 4 pages) pt would like document to be faxed in and to call pt to inform that document was faxed in. Pt tel [phone]. Document put at front office tray under providers name.

## 2023-09-10 NOTE — Telephone Encounter (Signed)
 Paperwork placed in provider bin.

## 2023-09-12 ENCOUNTER — Other Ambulatory Visit: Payer: Self-pay | Admitting: Family Medicine

## 2023-09-12 ENCOUNTER — Other Ambulatory Visit (HOSPITAL_BASED_OUTPATIENT_CLINIC_OR_DEPARTMENT_OTHER): Payer: Self-pay

## 2023-09-12 DIAGNOSIS — G43109 Migraine with aura, not intractable, without status migrainosus: Secondary | ICD-10-CM

## 2023-09-12 MED ORDER — OZEMPIC (1 MG/DOSE) 4 MG/3ML ~~LOC~~ SOPN
1.0000 mg | PEN_INJECTOR | SUBCUTANEOUS | 1 refills | Status: DC
  Filled 2023-09-12: qty 3, 28d supply, fill #0
  Filled 2023-10-19: qty 3, 28d supply, fill #1

## 2023-09-12 MED ORDER — RIZATRIPTAN BENZOATE 5 MG PO TBDP
5.0000 mg | ORAL_TABLET | ORAL | 2 refills | Status: AC | PRN
  Filled 2023-09-12: qty 10, 17d supply, fill #0

## 2023-09-14 ENCOUNTER — Other Ambulatory Visit (HOSPITAL_BASED_OUTPATIENT_CLINIC_OR_DEPARTMENT_OTHER): Payer: Self-pay

## 2023-09-18 ENCOUNTER — Other Ambulatory Visit (HOSPITAL_BASED_OUTPATIENT_CLINIC_OR_DEPARTMENT_OTHER): Payer: Self-pay

## 2023-09-18 ENCOUNTER — Encounter: Payer: Self-pay | Admitting: Family Medicine

## 2023-09-18 ENCOUNTER — Other Ambulatory Visit: Payer: Self-pay | Admitting: Family Medicine

## 2023-09-18 DIAGNOSIS — F411 Generalized anxiety disorder: Secondary | ICD-10-CM

## 2023-09-18 MED ORDER — ONDANSETRON 4 MG PO TBDP
4.0000 mg | ORAL_TABLET | Freq: Three times a day (TID) | ORAL | 0 refills | Status: DC | PRN
  Filled 2023-09-18: qty 20, 7d supply, fill #0

## 2023-09-18 MED ORDER — CITALOPRAM HYDROBROMIDE 20 MG PO TABS
20.0000 mg | ORAL_TABLET | Freq: Every day | ORAL | 3 refills | Status: AC
  Filled 2023-09-18: qty 90, 90d supply, fill #0
  Filled 2024-01-31: qty 90, 90d supply, fill #1
  Filled 2024-07-03: qty 90, 90d supply, fill #2

## 2023-09-19 ENCOUNTER — Other Ambulatory Visit (HOSPITAL_BASED_OUTPATIENT_CLINIC_OR_DEPARTMENT_OTHER): Payer: Self-pay

## 2023-09-20 ENCOUNTER — Ambulatory Visit: Payer: 59 | Admitting: Psychology

## 2023-09-20 DIAGNOSIS — F4323 Adjustment disorder with mixed anxiety and depressed mood: Secondary | ICD-10-CM

## 2023-09-20 NOTE — Progress Notes (Signed)
 [facility] Counselor/Therapist Progress Note  Patient ID: Omah Dewalt, MRN: [id number],    Date: 09/20/2023  Time Spent: 12:00pm-12:50pm   50 minutes   Treatment Type: Individual Therapy  Reported Symptoms: stress  Mental Status Exam: Appearance:  Casual     Behavior: Appropriate  Motor: Normal  Speech/Language:  Normal Rate  Affect: Appropriate  Mood: normal  Thought process: normal  Thought content:   WNL  Sensory/Perceptual disturbances:   WNL  Orientation: oriented to person, place, time/date, and situation  Attention: Good  Concentration: Good  Memory: WNL  Fund of knowledge:  Good  Insight:   Good  Judgment:  Good  Impulse Control: Good   Risk Assessment: Danger to Self:  No Self-injurious Behavior: No Danger to Others: No Duty to Warn:no Physical Aggression / Violence:No  Access to Firearms a concern: No  Gang Involvement:No   Subjective: Pt present for face-to-face individual therapy via video.  Pt consents to telehealth video session and is aware of limitations and benefits of virtual sessions.  Location of pt: home Location of therapist: home office.   Pt talked about being out of work on [employer] for 3 weeks.  Pt went back to work this week.  Pt is adjusting ok bc work has not been very busy. Pt talked about her aunt passing away last week.  Helped pt process her feelings and grief. Pt talked about her grandmother.   Pt states she is tired of being the "middle man" btw her grandmother and mother.   Pt's GM and mother tend to have conflict and rely on pt to work things out.   Helped pt process family dynamics.   Worked on healthy boundary setting. Pt talked about her kids.   Her 72 yo [name] is "boy crazy".   Addressed how pt parents that challenging issues.   Pt is good about being direct and setting healthy limits.   Pt's 34 yo Janey Greaser is very active and keeps pt busy. Worked on self care strategies. Provided supportive therapy.     Interventions: Cognitive Behavioral Therapy and Insight-Oriented  Diagnosis:  F43.23  Plan of Care: Recommend ongoing therapy.  Pt participated in setting treatment goals.  Pt wants to find balance in life and improve coping skills and self care.   Plan to meet every two weeks.   Pt agrees with treatment plan.  Treatment Plan Client Abilities/Strengths  Pt is bright, engaging, and motivated for therapy.  Client Treatment Preferences  Individual therapy.  Client Statement of Needs  Improve copings skills and self care. Symptoms  Depressed or irritable mood. Excessive and/or unrealistic worry that is difficult to control occurring more days than not for at least 6 months about a number of events or activities. Hypervigilance (e.g., feeling constantly on edge, experiencing concentration difficulties, having trouble falling or staying asleep, exhibiting a general state of irritability).  Problems Addressed  Unipolar Depression, Anxiety Goals 1. Alleviate depressive symptoms and return to previous level of effective functioning. 2. Appropriately grieve the loss in order to normalize mood and to return to previously adaptive level of functioning. Objective Learn and implement behavioral strategies to overcome depression. Target Date: 2024-04-25 Frequency: Biweekly  Progress: 10 Modality: individual  Related Interventions Assist the client in developing skills that increase the likelihood of deriving pleasure from behavioral activation (e.g., assertiveness skills, developing an exercise plan, less internal/more external focus, increased social involvement); reinforce success. Engage the client in "behavioral activation," increasing his/her activity level and contact with sources of  reward, while identifying processes that inhibit activation. use behavioral techniques such as instruction, rehearsal, role-playing, role reversal, as needed, to facilitate activity in the client's daily life;  reinforce success. 3. Develop healthy interpersonal relationships that lead to the alleviation and help prevent the relapse of depression. 4. Develop healthy thinking patterns and beliefs about self, others, and the world that lead to the alleviation and help prevent the relapse of depression. 5. Enhance ability to effectively cope with the full variety of life's worries and anxieties. 6. Learn and implement coping skills that result in a reduction of anxiety and worry, and improved daily functioning. Objective Learn and implement problem-solving strategies for realistically addressing worries. Target Date: 2024-04-25 Frequency: Biweekly  Progress: 10 Modality: individual  Related Interventions Assign the client a homework exercise in which he/she problem-solves a current problem (see Mastery of Your Anxiety and Worry: Workbook by Elenora Fender and Filbert Schilder or Generalized Anxiety Disorder by Elesa Hacker, and Filbert Schilder); review, reinforce success, and provide corrective feedback toward improvement. Teach the client problem-solving strategies involving specifically defining a problem, generating options for addressing it, evaluating the pros and cons of each option, selecting and implementing an optional action, and reevaluating and refining the action. Objective Learn and implement calming skills to reduce overall anxiety and manage anxiety symptoms. Target Date: 2024-04-25 Frequency: Biweekly  Progress: 10 Modality: individual  Related Interventions Assign the client to read about progressive muscle relaxation and other calming strategies in relevant books or treatment manuals (e.g., Progressive Relaxation Training by Twana First; Mastery of Your Anxiety and Worry: Workbook by Earlie Counts). Assign the client homework each session in which he/she practices relaxation exercises daily, gradually applying them progressively from non-anxiety-provoking to anxiety-provoking situations; review and  reinforce success while providing corrective feedback toward improvement. Teach the client calming/relaxation skills (e.g., applied relaxation, progressive muscle relaxation, cue controlled relaxation; mindful breathing; biofeedback) and how to discriminate better between relaxation and tension; teach the client how to apply these skills to his/her daily life. 7. Recognize, accept, and cope with feelings of depression. 8. Reduce overall frequency, intensity, and duration of the anxiety so that daily functioning is not impaired. 9. Resolve the core conflict that is the source of anxiety. 10. Stabilize anxiety level while increasing ability to function on a daily basis. Diagnosis F43,23  Conditions For Discharge Achievement of treatment goals and objectives   Salomon Fick, LCSW

## 2023-10-10 ENCOUNTER — Ambulatory Visit (INDEPENDENT_AMBULATORY_CARE_PROVIDER_SITE_OTHER): Payer: 59 | Admitting: Psychology

## 2023-10-10 DIAGNOSIS — F4323 Adjustment disorder with mixed anxiety and depressed mood: Secondary | ICD-10-CM | POA: Diagnosis not present

## 2023-10-10 NOTE — Progress Notes (Signed)
 [facility] Counselor/Therapist Progress Note  Patient ID: Danielle Garcia, MRN: [id number],    Date: 10/10/2023  Time Spent: 12:00pm-12:45pm   45 minutes   Treatment Type: Individual Therapy  Reported Symptoms: stress  Mental Status Exam: Appearance:  Casual     Behavior: Appropriate  Motor: Normal  Speech/Language:  Normal Rate  Affect: Appropriate  Mood: normal  Thought process: normal  Thought content:   WNL  Sensory/Perceptual disturbances:   WNL  Orientation: oriented to person, place, time/date, and situation  Attention: Good  Concentration: Good  Memory: WNL  Fund of knowledge:  Good  Insight:   Good  Judgment:  Good  Impulse Control: Good   Risk Assessment: Danger to Self:  No Self-injurious Behavior: No Danger to Others: No Duty to Warn:no Physical Aggression / Violence:No  Access to Firearms a concern: No  Gang Involvement:No   Subjective: Pt present for face-to-face individual therapy via video.  Pt consents to telehealth video session and is aware of limitations and benefits of virtual sessions.  Location of pt: home Location of therapist: home office.   Pt talked about work.  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  Pt feels the work from home position will meet her needs better as the mother of a young child.   Pt talked about her grandmother.   Pt states her grandmother is in the hospital and may be transferred to [facility].  Pt is concerned about her grandmother but is trying to spend all the time with her she can and is trying to not focus on her impending death.    Pt states her mother is not patient with pt's grandmother and this is upsetting to pt.   Addressed how pt can communicate about the relationship dynamics with her mother.   Pt talked about her boyfriend.  He has been spending more time with her and  it has been positive so she is not going to break up with him at this point.   Pt states her mood has improved bc she has increased the Celexa to 20 mg and it seems to be helping.   Worked on self care strategies. Provided supportive therapy.    Interventions: Cognitive Behavioral Therapy and Insight-Oriented  Diagnosis:  F43.23  Plan of Care: Recommend ongoing therapy.  Pt participated in setting treatment goals.  Pt wants to find balance in life and improve coping skills and self care.   Plan to meet every two weeks.   Pt agrees with treatment plan.  Treatment Plan Client Abilities/Strengths  Pt is bright, engaging, and motivated for therapy.  Client Treatment Preferences  Individual therapy.  Client Statement of Needs  Improve copings skills and self care. Symptoms  Depressed or irritable mood. Excessive and/or unrealistic worry that is difficult to control occurring more days than not for at least 6 months about a number of events or activities. Hypervigilance (e.g., feeling constantly on edge, experiencing concentration difficulties, having trouble falling or staying asleep, exhibiting a general state of irritability).  Problems Addressed  Unipolar Depression, Anxiety Goals 1. Alleviate depressive symptoms and return to previous level of effective functioning. 2. Appropriately grieve the loss in order to normalize mood and to return to previously adaptive level of functioning. Objective Learn and implement behavioral strategies to overcome depression. Target Date: 2024-04-25 Frequency: Biweekly  Progress: 10  Modality: individual  Related Interventions Assist the client in developing skills that increase the likelihood of deriving pleasure from behavioral activation (e.g., assertiveness skills, developing an exercise plan, less internal/more external focus, increased social involvement); reinforce success. Engage the client in "behavioral activation," increasing his/her activity  level and contact with sources of reward, while identifying processes that inhibit activation. use behavioral techniques such as instruction, rehearsal, role-playing, role reversal, as needed, to facilitate activity in the client's daily life; reinforce success. 3. Develop healthy interpersonal relationships that lead to the alleviation and help prevent the relapse of depression. 4. Develop healthy thinking patterns and beliefs about self, others, and the world that lead to the alleviation and help prevent the relapse of depression. 5. Enhance ability to effectively cope with the full variety of life's worries and anxieties. 6. Learn and implement coping skills that result in a reduction of anxiety and worry, and improved daily functioning. Objective Learn and implement problem-solving strategies for realistically addressing worries. Target Date: 2024-04-25 Frequency: Biweekly  Progress: 10 Modality: individual  Related Interventions Assign the client a homework exercise in which he/she problem-solves a current problem (see Mastery of Your Anxiety and Worry: Workbook by Elenora Fender and Filbert Schilder or Generalized Anxiety Disorder by Elesa Hacker, and Filbert Schilder); review, reinforce success, and provide corrective feedback toward improvement. Teach the client problem-solving strategies involving specifically defining a problem, generating options for addressing it, evaluating the pros and cons of each option, selecting and implementing an optional action, and reevaluating and refining the action. Objective Learn and implement calming skills to reduce overall anxiety and manage anxiety symptoms. Target Date: 2024-04-25 Frequency: Biweekly  Progress: 10 Modality: individual  Related Interventions Assign the client to read about progressive muscle relaxation and other calming strategies in relevant books or treatment manuals (e.g., Progressive Relaxation Training by Twana First; Mastery of Your Anxiety and  Worry: Workbook by Earlie Counts). Assign the client homework each session in which he/she practices relaxation exercises daily, gradually applying them progressively from non-anxiety-provoking to anxiety-provoking situations; review and reinforce success while providing corrective feedback toward improvement. Teach the client calming/relaxation skills (e.g., applied relaxation, progressive muscle relaxation, cue controlled relaxation; mindful breathing; biofeedback) and how to discriminate better between relaxation and tension; teach the client how to apply these skills to his/her daily life. 7. Recognize, accept, and cope with feelings of depression. 8. Reduce overall frequency, intensity, and duration of the anxiety so that daily functioning is not impaired. 9. Resolve the core conflict that is the source of anxiety. 10. Stabilize anxiety level while increasing ability to function on a daily basis. Diagnosis F43,23  Conditions For Discharge Achievement of treatment goals and objectives   Salomon Fick, LCSW

## 2023-10-22 ENCOUNTER — Telehealth: Payer: Self-pay | Admitting: Family Medicine

## 2023-10-22 ENCOUNTER — Encounter: Payer: Self-pay | Admitting: Family Medicine

## 2023-10-22 ENCOUNTER — Other Ambulatory Visit (HOSPITAL_BASED_OUTPATIENT_CLINIC_OR_DEPARTMENT_OTHER): Payer: Self-pay

## 2023-10-22 ENCOUNTER — Telehealth (INDEPENDENT_AMBULATORY_CARE_PROVIDER_SITE_OTHER): Admitting: Family Medicine

## 2023-10-22 DIAGNOSIS — F411 Generalized anxiety disorder: Secondary | ICD-10-CM

## 2023-10-22 DIAGNOSIS — G43009 Migraine without aura, not intractable, without status migrainosus: Secondary | ICD-10-CM | POA: Diagnosis not present

## 2023-10-22 NOTE — Telephone Encounter (Signed)
 Pt sent in FMLA paperwork to be filled out by provider (FMLA 4 pages) Pt would like to be called when document ready and have paperwork faxed  Fax #[phone]. Document put at front office tray under providers name.

## 2023-10-22 NOTE — Telephone Encounter (Signed)
 Paperwork completed and faxed.

## 2023-10-22 NOTE — Progress Notes (Signed)
 CC: F/u  Subjective: Patient is a 33 y.o. female here for f/u. We are interacting via web portal for an electronic face-to-face visit. I verified patient's ID using 2 identifiers. Patient agreed to proceed with visit via this method. Patient is in the car, I am at office. Patient and I are present for visit.   Patient has a history of migraines.  She takes nothing prophylactically but does take Maxalt 5 mg daily as needed.  Lately she has been getting a couple headaches per month.  The Maxalt does help.  No adverse effects.  She has associated nausea/vomiting, light/sound sensitivity.  Darkness helps.  She receives yearly FMLA for work and is requesting a renewal.  GAD-patient has a history of generalized anxiety.  She is currently taking Celexa 20 mg daily.  She reports compliance with the medication and no reported adverse effects.  It is helpful.  The failing health of her grandmother who she is very close with is contributing to worsening stress.  She is following with a therapist.  No homicidal or suicidal ideation.  No self-medication.  Past Medical History:  Diagnosis Date   Anxiety    Asthma    as a child   Depression    doing ok now   Fibroid    Floaters    GERD (gastroesophageal reflux disease)    HTN (hypertension)    2014/15 no meds since then   Infection    UTI   Migraine    Pre-diabetes    Pulmonary embolus (HCC)    after first delivery    Objective: No conversational dyspnea Age appropriate judgment and insight Nml affect and mood  Assessment and Plan: Migraine without aura and without status migrainosus, not intractable  GAD (generalized anxiety disorder)  Chronic, stable.  Continue Maxalt 5 mg daily as needed.  We will fill out the FMLA forms as we previously had.  Chronic, stable.  Continue Celexa 20 mg daily, Klonopin 0.25-0.5 mg twice daily as needed.  Continue with the counseling team. Follow-up in 6 months for physical or as needed. The patient voiced  understanding and agreement to the plan.  Danielle Garcia [place], DO 10/22/23  7:10 AM

## 2023-10-30 ENCOUNTER — Ambulatory Visit (INDEPENDENT_AMBULATORY_CARE_PROVIDER_SITE_OTHER): Payer: 59 | Admitting: Psychology

## 2023-10-30 DIAGNOSIS — F4323 Adjustment disorder with mixed anxiety and depressed mood: Secondary | ICD-10-CM

## 2023-10-30 NOTE — Progress Notes (Signed)
 [facility] Counselor/Therapist Progress Note  Patient ID: Danielle Garcia, MRN: [id number],    Date: 10/30/2023  Time Spent: 12:00pm-12:45pm   45 minutes   Treatment Type: Individual Therapy  Reported Symptoms: stress  Mental Status Exam: Appearance:  Casual     Behavior: Appropriate  Motor: Normal  Speech/Language:  Normal Rate  Affect: Appropriate  Mood: normal  Thought process: normal  Thought content:   WNL  Sensory/Perceptual disturbances:   WNL  Orientation: oriented to person, place, time/date, and situation  Attention: Good  Concentration: Good  Memory: WNL  Fund of knowledge:  Good  Insight:   Good  Judgment:  Good  Impulse Control: Good   Risk Assessment: Danger to Self:  No Self-injurious Behavior: No Danger to Others: No Duty to Warn:no Physical Aggression / Violence:No  Access to Firearms a concern: No  Gang Involvement:No   Subjective: Pt present for face-to-face individual therapy via video.  Pt consents to telehealth video session and is aware of limitations and benefits of virtual sessions.  Location of pt: home Location of therapist: home office.   Pt talked about her grandmother.   Pt states her grandmother is in home hospice care and pt's uncle and girlfriend are providing the caregiving.  Pt is concerned about her grandmother but is trying to spend all the time with her she can and is trying to not focus on her impending death.     Pt talked about her boyfriend.  He has been spending more time with her and it has been going well.   He did recently lose his job which is worrisome for pt. Pt talked about having some difficulty sleeping bc of racing thoughts.   Encouraged pt to journal her thoughts.  Worked on calming strategies and worry management.    Pt talked about parenting.   It is challenging parenting a teenager and 33 year old.   Addressed the challenges and worked on parenting strategies.    Pt states her mood has continued to  be stable bc she has increased the Celexa to 20 mg and it seems to be helping.   Worked on self care strategies. Provided supportive therapy.    Interventions: Cognitive Behavioral Therapy and Insight-Oriented  Diagnosis:  F43.23  Plan of Care: Recommend ongoing therapy.  Pt participated in setting treatment goals.  Pt wants to find balance in life and improve coping skills and self care.   Plan to meet every two weeks.   Pt agrees with treatment plan.  Treatment Plan Client Abilities/Strengths  Pt is bright, engaging, and motivated for therapy.  Client Treatment Preferences  Individual therapy.  Client Statement of Needs  Improve copings skills and self care. Symptoms  Depressed or irritable mood. Excessive and/or unrealistic worry that is difficult to control occurring more days than not for at least 6 months about a number of events or activities. Hypervigilance (e.g., feeling constantly on edge, experiencing concentration difficulties, having trouble falling or staying asleep, exhibiting a general state of irritability).  Problems Addressed  Unipolar Depression, Anxiety Goals 1. Alleviate depressive symptoms and return to previous level of effective functioning. 2. Appropriately grieve the loss in order to normalize mood and to return to previously adaptive level of functioning. Objective Learn and implement behavioral strategies to overcome depression. Target Date: 2024-04-25 Frequency: Biweekly  Progress: 10 Modality: individual  Related Interventions Assist the client in developing skills that increase the likelihood of deriving pleasure from behavioral activation (e.g., assertiveness skills, developing an exercise  plan, less internal/more external focus, increased social involvement); reinforce success. Engage the client in "behavioral activation," increasing his/her activity level and contact with sources of reward, while identifying processes that inhibit activation. use  behavioral techniques such as instruction, rehearsal, role-playing, role reversal, as needed, to facilitate activity in the client's daily life; reinforce success. 3. Develop healthy interpersonal relationships that lead to the alleviation and help prevent the relapse of depression. 4. Develop healthy thinking patterns and beliefs about self, others, and the world that lead to the alleviation and help prevent the relapse of depression. 5. Enhance ability to effectively cope with the full variety of life's worries and anxieties. 6. Learn and implement coping skills that result in a reduction of anxiety and worry, and improved daily functioning. Objective Learn and implement problem-solving strategies for realistically addressing worries. Target Date: 2024-04-25 Frequency: Biweekly  Progress: 10 Modality: individual  Related Interventions Assign the client a homework exercise in which he/she problem-solves a current problem (see Mastery of Your Anxiety and Worry: Workbook by Colbert Dates and Edna Gouty or Generalized Anxiety Disorder by Woodson He, and Edna Gouty); review, reinforce success, and provide corrective feedback toward improvement. Teach the client problem-solving strategies involving specifically defining a problem, generating options for addressing it, evaluating the pros and cons of each option, selecting and implementing an optional action, and reevaluating and refining the action. Objective Learn and implement calming skills to reduce overall anxiety and manage anxiety symptoms. Target Date: 2024-04-25 Frequency: Biweekly  Progress: 10 Modality: individual  Related Interventions Assign the client to read about progressive muscle relaxation and other calming strategies in relevant books or treatment manuals (e.g., Progressive Relaxation Training by Juleen Oakland; Mastery of Your Anxiety and Worry: Workbook by Rodney Clamp). Assign the client homework each session in which he/she  practices relaxation exercises daily, gradually applying them progressively from non-anxiety-provoking to anxiety-provoking situations; review and reinforce success while providing corrective feedback toward improvement. Teach the client calming/relaxation skills (e.g., applied relaxation, progressive muscle relaxation, cue controlled relaxation; mindful breathing; biofeedback) and how to discriminate better between relaxation and tension; teach the client how to apply these skills to his/her daily life. 7. Recognize, accept, and cope with feelings of depression. 8. Reduce overall frequency, intensity, and duration of the anxiety so that daily functioning is not impaired. 9. Resolve the core conflict that is the source of anxiety. 10. Stabilize anxiety level while increasing ability to function on a daily basis. Diagnosis F43,23  Conditions For Discharge Achievement of treatment goals and objectives   Willey Harrier, LCSW

## 2023-11-07 ENCOUNTER — Other Ambulatory Visit (HOSPITAL_COMMUNITY): Payer: Self-pay

## 2023-11-07 ENCOUNTER — Telehealth: Admitting: Physician Assistant

## 2023-11-07 DIAGNOSIS — B9689 Other specified bacterial agents as the cause of diseases classified elsewhere: Secondary | ICD-10-CM | POA: Diagnosis not present

## 2023-11-07 DIAGNOSIS — J019 Acute sinusitis, unspecified: Secondary | ICD-10-CM | POA: Diagnosis not present

## 2023-11-07 MED ORDER — AMOXICILLIN-POT CLAVULANATE 875-125 MG PO TABS
1.0000 | ORAL_TABLET | Freq: Two times a day (BID) | ORAL | 0 refills | Status: DC
  Filled 2023-11-07: qty 14, 7d supply, fill #0

## 2023-11-07 NOTE — Progress Notes (Signed)

## 2023-11-19 ENCOUNTER — Other Ambulatory Visit (HOSPITAL_COMMUNITY): Payer: Self-pay

## 2023-11-20 ENCOUNTER — Other Ambulatory Visit: Payer: Self-pay | Admitting: Family Medicine

## 2023-11-20 ENCOUNTER — Other Ambulatory Visit (HOSPITAL_COMMUNITY): Payer: Self-pay

## 2023-11-20 MED ORDER — OZEMPIC (1 MG/DOSE) 4 MG/3ML ~~LOC~~ SOPN
1.0000 mg | PEN_INJECTOR | SUBCUTANEOUS | 1 refills | Status: DC
  Filled 2023-11-20 – 2023-12-12 (×2): qty 3, 28d supply, fill #0
  Filled 2024-01-31 – 2024-02-14 (×2): qty 3, 28d supply, fill #1

## 2023-11-27 ENCOUNTER — Ambulatory Visit: Admitting: Psychology

## 2023-11-28 ENCOUNTER — Other Ambulatory Visit: Payer: Self-pay | Admitting: Family Medicine

## 2023-11-28 ENCOUNTER — Other Ambulatory Visit (HOSPITAL_BASED_OUTPATIENT_CLINIC_OR_DEPARTMENT_OTHER): Payer: Self-pay

## 2023-11-28 ENCOUNTER — Other Ambulatory Visit (HOSPITAL_COMMUNITY): Payer: Self-pay

## 2023-11-28 ENCOUNTER — Encounter (HOSPITAL_COMMUNITY): Payer: Self-pay

## 2023-11-28 MED ORDER — ONDANSETRON 4 MG PO TBDP
4.0000 mg | ORAL_TABLET | Freq: Three times a day (TID) | ORAL | 0 refills | Status: DC | PRN
  Filled 2023-11-28: qty 20, 7d supply, fill #0

## 2023-11-29 ENCOUNTER — Other Ambulatory Visit: Payer: Self-pay | Admitting: Family Medicine

## 2023-11-29 ENCOUNTER — Other Ambulatory Visit (HOSPITAL_COMMUNITY): Payer: Self-pay

## 2023-11-29 ENCOUNTER — Other Ambulatory Visit (HOSPITAL_BASED_OUTPATIENT_CLINIC_OR_DEPARTMENT_OTHER): Payer: Self-pay

## 2023-11-29 MED ORDER — ONDANSETRON 4 MG PO TBDP
4.0000 mg | ORAL_TABLET | Freq: Three times a day (TID) | ORAL | 0 refills | Status: DC | PRN
  Filled 2023-11-29 – 2024-01-31 (×4): qty 20, 7d supply, fill #0

## 2023-11-30 ENCOUNTER — Other Ambulatory Visit (HOSPITAL_COMMUNITY): Payer: Self-pay

## 2023-12-03 ENCOUNTER — Ambulatory Visit: Admitting: Psychology

## 2023-12-05 ENCOUNTER — Other Ambulatory Visit (HOSPITAL_COMMUNITY): Payer: Self-pay

## 2023-12-05 ENCOUNTER — Telehealth: Admitting: Family Medicine

## 2023-12-05 DIAGNOSIS — N760 Acute vaginitis: Secondary | ICD-10-CM

## 2023-12-05 DIAGNOSIS — B9689 Other specified bacterial agents as the cause of diseases classified elsewhere: Secondary | ICD-10-CM

## 2023-12-05 MED ORDER — METRONIDAZOLE 500 MG PO TABS
500.0000 mg | ORAL_TABLET | Freq: Two times a day (BID) | ORAL | 0 refills | Status: AC
  Filled 2023-12-05: qty 14, 7d supply, fill #0

## 2023-12-05 NOTE — Progress Notes (Signed)

## 2023-12-11 ENCOUNTER — Other Ambulatory Visit (HOSPITAL_COMMUNITY): Payer: Self-pay

## 2023-12-12 ENCOUNTER — Other Ambulatory Visit (HOSPITAL_COMMUNITY): Payer: Self-pay

## 2023-12-24 ENCOUNTER — Ambulatory Visit: Admitting: Psychology

## 2023-12-24 DIAGNOSIS — F4323 Adjustment disorder with mixed anxiety and depressed mood: Secondary | ICD-10-CM

## 2023-12-24 NOTE — Progress Notes (Deleted)
 [facility] Counselor/Therapist Progress Note  Patient ID: Danielle Garcia, MRN: [id number],    Date: 12/24/2023  Time Spent: 12:00pm-12:45pm   45 minutes   Treatment Type: Individual Therapy  Reported Symptoms: stress  Mental Status Exam: Appearance:  Casual     Behavior: Appropriate  Motor: Normal  Speech/Language:  Normal Rate  Affect: Appropriate  Mood: normal  Thought process: normal  Thought content:   WNL  Sensory/Perceptual disturbances:   WNL  Orientation: oriented to person, place, time/date, and situation  Attention: Good  Concentration: Good  Memory: WNL  Fund of knowledge:  Good  Insight:   Good  Judgment:  Good  Impulse Control: Good   Risk Assessment: Danger to Self:  No Self-injurious Behavior: No Danger to Others: No Duty to Warn:no Physical Aggression / Violence:No  Access to Firearms a concern: No  Gang Involvement:No   Subjective: Danielle Garcia present for face-to-face individual therapy via video.  Danielle Garcia consents to telehealth video session and is aware of limitations and benefits of virtual sessions.  Location of Danielle Garcia: home Location of therapist: home office.   Danielle Garcia talked about her grandmother.   Danielle Garcia states her grandmother is in home hospice care and Danielle Garcia's uncle and girlfriend are providing the caregiving.  Danielle Garcia is concerned about her grandmother but is trying to spend all the time with her she can and is trying to not focus on her impending death.     Danielle Garcia talked about her boyfriend.  He has been spending more time with her and it has been going well.   He did recently lose his job which is worrisome for Danielle Garcia. Danielle Garcia talked about having some difficulty sleeping bc of racing thoughts.   Encouraged Danielle Garcia to journal her thoughts.  Worked on calming strategies and worry management.    Danielle Garcia talked about parenting.   It is challenging parenting a teenager and 33 year old.   Addressed the challenges and worked on parenting strategies.    Danielle Garcia states her mood has continued to  be stable bc she has increased the Celexa  to 20 mg and it seems to be helping.   Worked on self care strategies. Provided supportive therapy.    Interventions: Cognitive Behavioral Therapy and Insight-Oriented  Diagnosis:  F43.23  Plan of Care: Recommend ongoing therapy.  Danielle Garcia participated in setting treatment goals.  Danielle Garcia wants to find balance in life and improve coping skills and self care.   Plan to meet every two weeks.   Danielle Garcia agrees with treatment plan.  Treatment Plan Client Abilities/Strengths  Danielle Garcia is bright, engaging, and motivated for therapy.  Client Treatment Preferences  Individual therapy.  Client Statement of Needs  Improve copings skills and self care. Symptoms  Depressed or irritable mood. Excessive and/or unrealistic worry that is difficult to control occurring more days than not for at least 6 months about a number of events or activities. Hypervigilance (e.g., feeling constantly on edge, experiencing concentration difficulties, having trouble falling or staying asleep, exhibiting a general state of irritability).  Problems Addressed  Unipolar Depression, Anxiety Goals 1. Alleviate depressive symptoms and return to previous level of effective functioning. 2. Appropriately grieve the loss in order to normalize mood and to return to previously adaptive level of functioning. Objective Learn and implement behavioral strategies to overcome depression. Target Date: 2024-04-25 Frequency: Biweekly  Progress: 10 Modality: individual  Related Interventions Assist the client in developing skills that increase the likelihood of deriving pleasure from behavioral activation (e.g., assertiveness skills, developing an exercise  plan, less internal/more external focus, increased social involvement); reinforce success. Engage the client in "behavioral activation," increasing his/her activity level and contact with sources of reward, while identifying processes that inhibit activation. use  behavioral techniques such as instruction, rehearsal, role-playing, role reversal, as needed, to facilitate activity in the client's daily life; reinforce success. 3. Develop healthy interpersonal relationships that lead to the alleviation and help prevent the relapse of depression. 4. Develop healthy thinking patterns and beliefs about self, others, and the world that lead to the alleviation and help prevent the relapse of depression. 5. Enhance ability to effectively cope with the full variety of life's worries and anxieties. 6. Learn and implement coping skills that result in a reduction of anxiety and worry, and improved daily functioning. Objective Learn and implement problem-solving strategies for realistically addressing worries. Target Date: 2024-04-25 Frequency: Biweekly  Progress: 10 Modality: individual  Related Interventions Assign the client a homework exercise in which he/she problem-solves a current problem (see Mastery of Your Anxiety and Worry: Workbook by Colbert Dates and Edna Gouty or Generalized Anxiety Disorder by Woodson He, and Edna Gouty); review, reinforce success, and provide corrective feedback toward improvement. Teach the client problem-solving strategies involving specifically defining a problem, generating options for addressing it, evaluating the pros and cons of each option, selecting and implementing an optional action, and reevaluating and refining the action. Objective Learn and implement calming skills to reduce overall anxiety and manage anxiety symptoms. Target Date: 2024-04-25 Frequency: Biweekly  Progress: 10 Modality: individual  Related Interventions Assign the client to read about progressive muscle relaxation and other calming strategies in relevant books or treatment manuals (e.g., Progressive Relaxation Training by Juleen Oakland; Mastery of Your Anxiety and Worry: Workbook by Rodney Clamp). Assign the client homework each session in which he/she  practices relaxation exercises daily, gradually applying them progressively from non-anxiety-provoking to anxiety-provoking situations; review and reinforce success while providing corrective feedback toward improvement. Teach the client calming/relaxation skills (e.g., applied relaxation, progressive muscle relaxation, cue controlled relaxation; mindful breathing; biofeedback) and how to discriminate better between relaxation and tension; teach the client how to apply these skills to his/her daily life. 7. Recognize, accept, and cope with feelings of depression. 8. Reduce overall frequency, intensity, and duration of the anxiety so that daily functioning is not impaired. 9. Resolve the core conflict that is the source of anxiety. 10. Stabilize anxiety level while increasing ability to function on a daily basis. Diagnosis F43,23  Conditions For Discharge Achievement of treatment goals and objectives   Willey Harrier, LCSW                  Jerris Fleer [place], LCSW

## 2024-01-15 ENCOUNTER — Telehealth: Payer: Self-pay

## 2024-01-15 NOTE — Telephone Encounter (Signed)
 done

## 2024-01-16 ENCOUNTER — Telehealth: Admitting: Family Medicine

## 2024-01-16 ENCOUNTER — Telehealth: Payer: Self-pay

## 2024-01-16 ENCOUNTER — Encounter: Payer: Self-pay | Admitting: Family Medicine

## 2024-01-16 ENCOUNTER — Ambulatory Visit: Admitting: Psychology

## 2024-01-16 DIAGNOSIS — Z634 Disappearance and death of family member: Secondary | ICD-10-CM | POA: Diagnosis not present

## 2024-01-16 DIAGNOSIS — F411 Generalized anxiety disorder: Secondary | ICD-10-CM

## 2024-01-16 NOTE — Progress Notes (Signed)
 CC: F/u  Subjective Danielle Garcia presents for f/u anxiety/depression. We are interacting via web portal for an electronic face-to-face visit. I verified patient's ID using 2 identifiers. Patient agreed to proceed with visit via this method. Patient is at home, I am at office. Patient and I are present for visit.   Pt is currently being treated with Celexa  20 mg/d, Klonopin  prn.  Reports doing OK since treatment. Grandmother is dying and pt is struggling.  No thoughts of harming self or others. No self-medication with alcohol, prescription drugs or illicit drugs. Pt is not following with a counselor/psychologist.  Past Medical History:  Diagnosis Date   Anxiety    Asthma    as a child   Depression    doing ok now   Fibroid    Floaters    GERD (gastroesophageal reflux disease)    HTN (hypertension)    2014/15 no meds since then   Infection    UTI   Migraine    Pre-diabetes    Pulmonary embolus (HCC)    after first delivery   Allergies as of 01/16/2024       Reactions   Reglan  [metoclopramide ] Other (See Comments)   Restlessness, agitationn        Medication List        Accurate as of January 16, 2024  1:31 PM. If you have any questions, ask your nurse or doctor.          citalopram  20 MG tablet Commonly known as: CELEXA  Take 1 tablet (20 mg total) by mouth daily.   clonazePAM  0.5 MG tablet Commonly known as: KLONOPIN  Take 0.5-1 tablets (0.25-0.5 mg total) by mouth 2 (two) times daily as needed for anxiety.   levonorgestrel  20 MCG/DAY Iud Commonly known as: MIRENA  1 each by Intrauterine route once.   ondansetron  4 MG disintegrating tablet Commonly known as: ZOFRAN -ODT Dissolve 1 tablet (4 mg total) in mouth every 8 (eight) hours as needed for nausea or vomiting.   Ozempic  (1 MG/DOSE) 4 MG/3ML Sopn Generic drug: Semaglutide  (1 MG/DOSE) Inject 1 mg into the skin once a week.   rizatriptan  5 MG disintegrating tablet Commonly known as: MAXALT -MLT Take 1  tablet (5 mg total) by mouth as needed for migraine. May repeat in 2 hours if needed        Exam No conversational dyspnea Age appropriate judgment and insight Nml affect and mood  Assessment and Plan  GAD (generalized anxiety disorder)  Bereavement  Chronic issue, not ideally controlled.  Continue Celexa  20 mg daily, Klonopin  0.5 mg twice daily as needed.  We will get her some FMLA for temporary/continuous leave starting 7/1-7/16.  She will hopefully get me the form today. The patient voiced understanding and agreement to the plan.  Danielle Garcia [place], DO 01/16/24 1:31 PM

## 2024-01-16 NOTE — Telephone Encounter (Signed)
 Message sent

## 2024-01-31 ENCOUNTER — Other Ambulatory Visit: Payer: Self-pay | Admitting: Family Medicine

## 2024-01-31 ENCOUNTER — Other Ambulatory Visit (HOSPITAL_COMMUNITY): Payer: Self-pay

## 2024-01-31 DIAGNOSIS — F411 Generalized anxiety disorder: Secondary | ICD-10-CM

## 2024-01-31 MED ORDER — CLONAZEPAM 0.5 MG PO TABS
0.2500 mg | ORAL_TABLET | Freq: Two times a day (BID) | ORAL | 1 refills | Status: AC | PRN
  Filled 2024-01-31: qty 30, 15d supply, fill #0
  Filled 2024-07-03: qty 30, 15d supply, fill #1

## 2024-01-31 NOTE — Telephone Encounter (Signed)
 Requesting: clonazepam  0.5mg   Contract: None UDS: None Last Visit: 01/16/24 Next Visit: None Last Refill: 07/13/23 #30 and 1RF  Please Advise

## 2024-02-12 ENCOUNTER — Other Ambulatory Visit (HOSPITAL_COMMUNITY): Payer: Self-pay

## 2024-02-14 ENCOUNTER — Other Ambulatory Visit (HOSPITAL_COMMUNITY): Payer: Self-pay

## 2024-02-26 ENCOUNTER — Telehealth (INDEPENDENT_AMBULATORY_CARE_PROVIDER_SITE_OTHER): Admitting: Family Medicine

## 2024-02-26 ENCOUNTER — Other Ambulatory Visit (HOSPITAL_BASED_OUTPATIENT_CLINIC_OR_DEPARTMENT_OTHER): Payer: Self-pay

## 2024-02-26 ENCOUNTER — Ambulatory Visit: Admitting: Family Medicine

## 2024-02-26 DIAGNOSIS — J0100 Acute maxillary sinusitis, unspecified: Secondary | ICD-10-CM

## 2024-02-26 MED ORDER — METHYLPREDNISOLONE 4 MG PO TBPK
ORAL_TABLET | ORAL | 0 refills | Status: DC
  Filled 2024-02-26: qty 21, 6d supply, fill #0

## 2024-02-26 NOTE — Progress Notes (Signed)
 CC: URI complaints  Danielle Garcia here for URI complaints. We are interacting via web portal for an electronic face-to-face visit. I verified patient's ID using 2 identifiers. Patient agreed to proceed with visit via this method. Patient is at home, I am at office. Patient and I are present for visit.   Duration: 1 week Associated symptoms: sinus congestion, sinus pain, rhinorrhea, ear pain, and sore throat Denies: itchy watery eyes, ear drainage, wheezing, shortness of breath, myalgia, dental pain, and fevers Treatment to date: Tylenol  sinus Sick contacts: No  Past Medical History:  Diagnosis Date   Anxiety    Asthma    as a child   Depression    doing ok now   Fibroid    Floaters    GERD (gastroesophageal reflux disease)    HTN (hypertension)    2014/15 no meds since then   Infection    UTI   Migraine    Pre-diabetes    Pulmonary embolus (HCC)    after first delivery    Objective No conversational dyspnea Age appropriate judgment and insight Nml affect and mood   Acute maxillary sinusitis, recurrence not specified - Plan: methylPREDNISolone  (MEDROL  DOSEPAK) 4 MG TBPK tablet  Medrol  Dosepak. If no improvement in 2 d, send message and will consider abx. Continue to push fluids, practice good hand hygiene, cover mouth when coughing. F/u prn. If starting to experience fevers, shaking, or shortness of breath, seek immediate care. Pt voiced understanding and agreement to the plan.  Danielle Mt [place], DO 02/26/24 8:46 AM

## 2024-02-27 ENCOUNTER — Ambulatory Visit (INDEPENDENT_AMBULATORY_CARE_PROVIDER_SITE_OTHER): Admitting: Family Medicine

## 2024-02-27 ENCOUNTER — Telehealth: Payer: Self-pay

## 2024-02-27 DIAGNOSIS — R059 Cough, unspecified: Secondary | ICD-10-CM

## 2024-02-27 DIAGNOSIS — R0981 Nasal congestion: Secondary | ICD-10-CM

## 2024-02-27 MED ORDER — METHYLPREDNISOLONE ACETATE 80 MG/ML IJ SUSP
80.0000 mg | Freq: Once | INTRAMUSCULAR | Status: AC
  Administered 2024-02-27 (×2): 80 mg via INTRAMUSCULAR

## 2024-02-27 NOTE — Addendum Note (Signed)
 Addended by: Brendi Mccarroll M on: 02/27/2024 07:47 AM   Modules accepted: Orders

## 2024-02-27 NOTE — Telephone Encounter (Signed)
 Pt had video visit on 02/26/2024, per Dr.Wendling Come in the do Covid and strep test. Pt received Depo  80 mg.

## 2024-02-29 ENCOUNTER — Other Ambulatory Visit (HOSPITAL_COMMUNITY): Payer: Self-pay

## 2024-02-29 ENCOUNTER — Other Ambulatory Visit: Payer: Self-pay | Admitting: Family Medicine

## 2024-02-29 MED ORDER — BENZONATATE 200 MG PO CAPS
200.0000 mg | ORAL_CAPSULE | Freq: Two times a day (BID) | ORAL | 0 refills | Status: DC | PRN
  Filled 2024-02-29: qty 20, 10d supply, fill #0

## 2024-02-29 MED ORDER — BENZONATATE 200 MG PO CAPS: 200.0000 mg | ORAL_CAPSULE | Freq: Two times a day (BID) | ORAL | 0 refills | Status: DC | PRN

## 2024-03-03 ENCOUNTER — Encounter (HOSPITAL_BASED_OUTPATIENT_CLINIC_OR_DEPARTMENT_OTHER): Payer: Self-pay | Admitting: Emergency Medicine

## 2024-03-03 ENCOUNTER — Emergency Department (HOSPITAL_BASED_OUTPATIENT_CLINIC_OR_DEPARTMENT_OTHER)

## 2024-03-03 ENCOUNTER — Other Ambulatory Visit: Payer: Self-pay

## 2024-03-03 ENCOUNTER — Emergency Department (HOSPITAL_BASED_OUTPATIENT_CLINIC_OR_DEPARTMENT_OTHER)
Admission: EM | Admit: 2024-03-03 | Discharge: 2024-03-04 | Disposition: A | Discharge status: Home / Self Care | Attending: Emergency Medicine | Admitting: Emergency Medicine

## 2024-03-03 DIAGNOSIS — J45909 Unspecified asthma, uncomplicated: Secondary | ICD-10-CM | POA: Diagnosis not present

## 2024-03-03 DIAGNOSIS — J069 Acute upper respiratory infection, unspecified: Secondary | ICD-10-CM | POA: Insufficient documentation

## 2024-03-03 DIAGNOSIS — R0989 Other specified symptoms and signs involving the circulatory and respiratory systems: Secondary | ICD-10-CM | POA: Diagnosis not present

## 2024-03-03 DIAGNOSIS — R0602 Shortness of breath: Secondary | ICD-10-CM | POA: Diagnosis not present

## 2024-03-03 DIAGNOSIS — R059 Cough, unspecified: Secondary | ICD-10-CM | POA: Diagnosis not present

## 2024-03-03 DIAGNOSIS — B9789 Other viral agents as the cause of diseases classified elsewhere: Secondary | ICD-10-CM | POA: Diagnosis not present

## 2024-03-03 MED ORDER — ALBUTEROL SULFATE (2.5 MG/3ML) 0.083% IN NEBU
INHALATION_SOLUTION | RESPIRATORY_TRACT | Status: DC
Start: 2024-03-03 — End: 2024-03-04
  Filled 2024-03-03: qty 3

## 2024-03-03 MED ORDER — ALBUTEROL SULFATE (2.5 MG/3ML) 0.083% IN NEBU
2.5000 mg | INHALATION_SOLUTION | Freq: Once | RESPIRATORY_TRACT | Status: AC
  Administered 2024-03-03: 2.5 mg via RESPIRATORY_TRACT

## 2024-03-03 NOTE — ED Triage Notes (Signed)
 Pt reports SOB, chest congestion/tightness, and cough; hx of asthma, RT assessed pt in triage

## 2024-03-03 NOTE — ED Provider Notes (Signed)
 [facility]  Provider Note  CSN: [id number] Arrival date & time: 03/03/24 2308  History Chief Complaint  Patient presents with   Shortness of Breath    Danielle Garcia is a 33 y.o. female with history of asthma reports about 5-6 days of nasal congestion, sore throat, no fever. She had a video visit with PCP and then came in for Covid/Strep swabs (which were neg), started on oral steroids, but only took one dose. She developed a cough while out of town 2 days ago which has been persistent. Used her inhaler today after getting home with some improvement. She is specifically concerned about PNA. Had a remote history of PE while pregnant, but not currently on [facility]. She feels some tightness with coughing, but no true chest pains.    Home Medications Prior to Admission medications   Medication Sig Start Date End Date Taking? Authorizing Provider  benzonatate  (TESSALON ) 200 MG capsule Take 1 capsule (200 mg total) by mouth 2 (two) times daily as needed for cough. 02/29/24   Frann Mabel Mt, DO  citalopram  (CELEXA ) 20 MG tablet Take 1 tablet (20 mg total) by mouth daily. 09/18/23   Frann Mabel Mt, DO  clonazePAM  (KLONOPIN ) 0.5 MG tablet Take 1/2-1 tablets (0.25-0.5 mg total) by mouth 2 (two) times daily as needed for anxiety. 01/31/24   Frann Mabel [place], DO  levonorgestrel  (MIRENA ) 20 MCG/DAY IUD 1 each by Intrauterine route once.    [provider]  methylPREDNISolone  (MEDROL  DOSEPAK) 4 MG TBPK tablet Take as directed on package for 6 days. 02/26/24   Frann Mabel Mt, DO  ondansetron  (ZOFRAN -ODT) 4 MG disintegrating tablet Dissolve 1 tablet (4 mg total) in mouth every 8 (eight) hours as needed for nausea or vomiting. 11/29/23   Frann, Mabel Mt, DO  rizatriptan  (MAXALT -MLT) 5 MG disintegrating tablet Take 1 tablet (5 mg total) by mouth as needed for migraine. May repeat in 2 hours if needed 09/12/23   Frann Mabel Mt, DO  Semaglutide , 1 MG/DOSE, (OZEMPIC , 1 MG/DOSE,) 4 MG/3ML SOPN Inject 1 mg into the skin once a week. 11/20/23   Frann Mabel Mt, DO     Allergies    Reglan  Evon.Finger ]   Review of Systems   Review of Systems Please see HPI for pertinent positives and negatives  Physical Exam BP (!) 128/93 (BP Location: Left Arm)   Pulse 84   Temp 97.9 F (36.6 C) (Oral)   Resp 18   Ht 5' 3 (1.6 m)   Wt 114.3 kg   SpO2 100%   BMI 44.64 kg/m   Physical Exam Vitals and nursing note reviewed.  Constitutional:      Appearance: Normal appearance.  HENT:     Head: Normocephalic and atraumatic.     Nose: Nose normal.     Mouth/Throat:     Mouth: Mucous membranes are moist.  Eyes:     Extraocular Movements: Extraocular movements intact.     Conjunctiva/sclera: Conjunctivae normal.  Cardiovascular:     Rate and Rhythm: Normal rate.  Pulmonary:     Effort: Pulmonary effort is normal.     Breath sounds: Normal breath sounds. No wheezing, rhonchi or rales.  Abdominal:     General: Abdomen is flat.     Palpations: Abdomen is soft.     Tenderness: There is no abdominal tenderness.  Musculoskeletal:        General: No swelling. Normal range of motion.  Cervical back: Neck supple.  Skin:    General: Skin is warm and dry.  Neurological:     General: No focal deficit present.     Mental Status: She is alert.  Psychiatric:        Mood and Affect: Mood normal.     ED Results / Procedures / Treatments   EKG None  Procedures Procedures  Medications Ordered in the ED Medications - No data to display  Initial Impression and Plan  Patient here with several days of viral URI symptoms, no fever. Some cough in the last couple of days. Vitals and exam are reassuring. Will check Covid/Flu/RSV swab and CXR. Otherwise no concern for ACS, AMI, PE, or other acute life threatening process.   ED Course       MDM Rules/Calculators/A&P Medical Decision  Making Amount and/or Complexity of Data Reviewed Radiology: ordered.     Final Clinical Impression(s) / ED Diagnoses Final diagnoses:  None    Rx / DC Orders ED Discharge Orders     None

## 2024-03-04 DIAGNOSIS — R0602 Shortness of breath: Secondary | ICD-10-CM | POA: Diagnosis not present

## 2024-03-04 DIAGNOSIS — R059 Cough, unspecified: Secondary | ICD-10-CM | POA: Diagnosis not present

## 2024-03-04 DIAGNOSIS — R0989 Other specified symptoms and signs involving the circulatory and respiratory systems: Secondary | ICD-10-CM | POA: Diagnosis not present

## 2024-03-04 LAB — RESP PANEL BY RT-PCR (RSV, FLU A&B, COVID)  RVPGX2
Influenza A by PCR: NEGATIVE
Influenza B by PCR: NEGATIVE
Resp Syncytial Virus by PCR: NEGATIVE
SARS Coronavirus 2 by RT PCR: NEGATIVE

## 2024-03-04 NOTE — ED Notes (Signed)
 Discharge instructions reviewed.   Opportunity for questions and concerns provided.   Alert, oriented and ambulatory.   Displays no signs of distress.

## 2024-03-10 ENCOUNTER — Other Ambulatory Visit: Payer: Self-pay | Admitting: Family Medicine

## 2024-03-10 ENCOUNTER — Other Ambulatory Visit (HOSPITAL_COMMUNITY): Payer: Self-pay

## 2024-03-10 LAB — POC COVID19 BINAXNOW: SARS Coronavirus 2 Ag: NEGATIVE

## 2024-03-10 LAB — POCT RAPID STREP A (OFFICE): Rapid Strep A Screen: NEGATIVE

## 2024-03-10 MED ORDER — METHYLPREDNISOLONE ACETATE 80 MG/ML IJ SUSP
80.0000 mg | Freq: Once | INTRAMUSCULAR | Status: AC
Start: 2024-03-10 — End: 2024-02-27
  Administered 2024-02-27: 80 mg via INTRAMUSCULAR

## 2024-03-10 MED ORDER — OZEMPIC (1 MG/DOSE) 4 MG/3ML ~~LOC~~ SOPN
1.0000 mg | PEN_INJECTOR | SUBCUTANEOUS | 1 refills | Status: DC
  Filled 2024-03-10: qty 3, 28d supply, fill #0
  Filled 2024-04-23 – 2024-05-20 (×2): qty 3, 28d supply, fill #1

## 2024-03-10 NOTE — Addendum Note (Signed)
 Addended by: Rosalee Tolley M on: 03/10/2024 10:26 AM   Modules accepted: Orders

## 2024-03-10 NOTE — Progress Notes (Signed)
 Pt had video visit on 02/26/2024, per Dr.Wendling Pt here on 02/27/24 Come in we did Covid and strep test and both test negative. Pt received Depo  80 mg.

## 2024-03-14 ENCOUNTER — Other Ambulatory Visit (HOSPITAL_COMMUNITY): Payer: Self-pay

## 2024-03-20 NOTE — Progress Notes (Signed)
 Order(s) created erroneously. Erroneous order ID: [id number]  Order moved by: CHART CORRECTION ANALYST SEVEN, IDENTITY  Order move date/time: 03/20/2024 5:16 PM  Source Patient: [id number]  Source Contact: 02/26/2024  Destination Patient: [id number]  Destination Contact: 05/19/2021

## 2024-03-20 NOTE — Progress Notes (Signed)
 Order(s) created erroneously. Erroneous order ID: [id number]  Order moved by: CHART CORRECTION ANALYST SEVEN, IDENTITY  Order move date/time: 03/20/2024 2:12 PM  Source Patient: [id number]  Source Contact: 02/26/2024  Destination Patient: [id number]  Destination Contact: 05/19/2021

## 2024-05-12 ENCOUNTER — Other Ambulatory Visit (HOSPITAL_BASED_OUTPATIENT_CLINIC_OR_DEPARTMENT_OTHER): Payer: Self-pay

## 2024-05-13 ENCOUNTER — Other Ambulatory Visit (HOSPITAL_BASED_OUTPATIENT_CLINIC_OR_DEPARTMENT_OTHER): Payer: Self-pay

## 2024-05-20 ENCOUNTER — Other Ambulatory Visit (HOSPITAL_BASED_OUTPATIENT_CLINIC_OR_DEPARTMENT_OTHER): Payer: Self-pay

## 2024-05-20 ENCOUNTER — Other Ambulatory Visit (HOSPITAL_COMMUNITY): Payer: Self-pay

## 2024-05-20 ENCOUNTER — Encounter: Payer: Self-pay | Admitting: Family Medicine

## 2024-05-20 ENCOUNTER — Ambulatory Visit (INDEPENDENT_AMBULATORY_CARE_PROVIDER_SITE_OTHER): Admitting: Family Medicine

## 2024-05-20 VITALS — BP 130/82 | HR 87 | Temp 98.0°F | Resp 16 | Ht 63.0 in | Wt 252.0 lb

## 2024-05-20 DIAGNOSIS — J4521 Mild intermittent asthma with (acute) exacerbation: Secondary | ICD-10-CM | POA: Diagnosis not present

## 2024-05-20 MED ORDER — ALBUTEROL SULFATE (2.5 MG/3ML) 0.083% IN NEBU
2.5000 mg | INHALATION_SOLUTION | Freq: Once | RESPIRATORY_TRACT | Status: AC
  Administered 2024-05-20: 2.5 mg via RESPIRATORY_TRACT

## 2024-05-20 MED ORDER — PREDNISONE 20 MG PO TABS
40.0000 mg | ORAL_TABLET | Freq: Every day | ORAL | 0 refills | Status: AC
  Filled 2024-05-20: qty 10, 5d supply, fill #0

## 2024-05-20 MED ORDER — BENZONATATE 200 MG PO CAPS
200.0000 mg | ORAL_CAPSULE | Freq: Two times a day (BID) | ORAL | 0 refills | Status: AC | PRN
  Filled 2024-05-20: qty 20, 10d supply, fill #0

## 2024-05-20 MED ORDER — BENZONATATE 200 MG PO CAPS
200.0000 mg | ORAL_CAPSULE | Freq: Two times a day (BID) | ORAL | 0 refills | Status: DC | PRN
  Filled 2024-05-20: qty 20, 10d supply, fill #0

## 2024-05-20 MED ORDER — AIRSUPRA 90-80 MCG/ACT IN AERO
2.0000 | INHALATION_SPRAY | Freq: Four times a day (QID) | RESPIRATORY_TRACT | 2 refills | Status: DC | PRN
  Filled 2024-05-20: qty 10.7, 30d supply, fill #0

## 2024-05-20 MED ORDER — METHYLPREDNISOLONE SODIUM SUCC 125 MG IJ SOLR
125.0000 mg | Freq: Once | INTRAMUSCULAR | Status: AC
  Administered 2024-05-20: 125 mg via INTRAVENOUS

## 2024-05-20 NOTE — Progress Notes (Signed)
 Chief Complaint  Patient presents with   Cough    Cough     Rolin Height here for URI complaints.  Duration: 2 days  Associated symptoms: wheezing, shortness of breath, chest tightness, and coughing Denies: sinus congestion, sinus pain, rhinorrhea, itchy watery eyes, ear pain, ear drainage, sore throat, myalgia, and fevers Treatment to date: Tessalon  Perles, SABA Sick contacts: No  Past Medical History:  Diagnosis Date   Anxiety    Asthma    as a child   Depression    doing ok now   Fibroid    Floaters    GERD (gastroesophageal reflux disease)    HTN (hypertension)    2014/15 no meds since then   Infection    UTI   Migraine    Pre-diabetes    Pulmonary embolus (HCC)    after first delivery    Objective BP 130/82 (BP Location: Left Arm, Patient Position: Sitting)   Pulse 87   Temp 98 F (36.7 C) (Oral)   Resp 16   Ht 5' 3 (1.6 m)   Wt 252 lb (114.3 kg)   SpO2 98%   BMI 44.64 kg/m  General: Awake, alert, appears stated age HEENT: AT, [place], ears patent b/l and TM's neg, nares patent w/o discharge, pharynx pink and without exudates, MMM, no sinus ttp Neck: No masses or asymmetry Heart: RRR Lungs: CTAB tho decreased air exchange, no accessory muscle use Psych: Age appropriate judgment and insight, normal mood and affect  Mild intermittent asthma with exacerbation - Plan: predniSONE  (DELTASONE ) 20 MG tablet, methylPREDNISolone  sodium succinate (SOLU-MEDROL ) 125 mg/2 mL injection 125 mg, albuterol  (PROVENTIL ) (2.5 MG/3ML) 0.083% nebulizer solution 2.5 mg  Exacerbation of chronic issue.  Breathing tx today. 5 d pred burst 40 mg daily.  Change albuterol  inhaler to Airsupra.  Continue to push fluids, practice good hand hygiene, cover mouth when coughing. F/u prn. If starting to experience fevers, shaking, or worsening shortness of breath, seek immediate care. Pt voiced understanding and agreement to the plan.  Mabel [place], DO 05/20/24 9:42 AM

## 2024-05-23 ENCOUNTER — Other Ambulatory Visit: Payer: Self-pay | Admitting: Family Medicine

## 2024-05-23 ENCOUNTER — Other Ambulatory Visit (HOSPITAL_COMMUNITY): Payer: Self-pay

## 2024-05-23 MED ORDER — ALBUTEROL SULFATE (2.5 MG/3ML) 0.083% IN NEBU
2.5000 mg | INHALATION_SOLUTION | Freq: Four times a day (QID) | RESPIRATORY_TRACT | 1 refills | Status: DC | PRN
  Filled 2024-05-23: qty 150, 13d supply, fill #0

## 2024-05-23 MED ORDER — ALBUTEROL SULFATE (2.5 MG/3ML) 0.083% IN NEBU: 2.5000 mg | INHALATION_SOLUTION | Freq: Four times a day (QID) | RESPIRATORY_TRACT | 1 refills | Status: DC | PRN

## 2024-05-30 ENCOUNTER — Other Ambulatory Visit (HOSPITAL_COMMUNITY): Payer: Self-pay

## 2024-07-03 ENCOUNTER — Other Ambulatory Visit: Payer: Self-pay

## 2024-07-03 ENCOUNTER — Other Ambulatory Visit: Payer: Self-pay | Admitting: Family Medicine

## 2024-07-03 ENCOUNTER — Other Ambulatory Visit (HOSPITAL_BASED_OUTPATIENT_CLINIC_OR_DEPARTMENT_OTHER): Payer: Self-pay

## 2024-07-03 MED ORDER — OZEMPIC (1 MG/DOSE) 4 MG/3ML ~~LOC~~ SOPN
1.0000 mg | PEN_INJECTOR | SUBCUTANEOUS | 1 refills | Status: AC
  Filled 2024-07-03: qty 3, 28d supply, fill #0

## 2024-07-04 ENCOUNTER — Other Ambulatory Visit (HOSPITAL_BASED_OUTPATIENT_CLINIC_OR_DEPARTMENT_OTHER): Payer: Self-pay

## 2024-07-04 ENCOUNTER — Other Ambulatory Visit: Payer: Self-pay

## 2024-07-04 ENCOUNTER — Other Ambulatory Visit (HOSPITAL_COMMUNITY): Payer: Self-pay

## 2024-07-04 DIAGNOSIS — R059 Cough, unspecified: Secondary | ICD-10-CM

## 2024-07-04 DIAGNOSIS — J4521 Mild intermittent asthma with (acute) exacerbation: Secondary | ICD-10-CM

## 2024-07-04 MED ORDER — ALBUTEROL SULFATE (2.5 MG/3ML) 0.083% IN NEBU
2.5000 mg | INHALATION_SOLUTION | Freq: Four times a day (QID) | RESPIRATORY_TRACT | 1 refills | Status: AC | PRN
  Filled 2024-07-04 (×2): qty 150, 13d supply, fill #0

## 2024-07-04 MED ORDER — AIRSUPRA 90-80 MCG/ACT IN AERO
2.0000 | INHALATION_SPRAY | Freq: Four times a day (QID) | RESPIRATORY_TRACT | 2 refills | Status: AC | PRN
  Filled 2024-07-04: qty 10.7, 30d supply, fill #0

## 2024-07-07 ENCOUNTER — Other Ambulatory Visit: Payer: Self-pay | Admitting: Family Medicine

## 2024-07-07 MED ORDER — PROMETHAZINE-DM 6.25-15 MG/5ML PO SYRP: 5.0000 mL | ORAL_SOLUTION | Freq: Four times a day (QID) | ORAL | 0 refills | Status: DC | PRN

## 2024-07-07 MED ORDER — PROMETHAZINE-DM 6.25-15 MG/5ML PO SYRP: 5.0000 mL | ORAL_SOLUTION | Freq: Four times a day (QID) | ORAL | 0 refills | Status: AC | PRN

## 2024-07-15 ENCOUNTER — Other Ambulatory Visit (HOSPITAL_BASED_OUTPATIENT_CLINIC_OR_DEPARTMENT_OTHER): Payer: Self-pay

## 2024-07-15 ENCOUNTER — Encounter (HOSPITAL_BASED_OUTPATIENT_CLINIC_OR_DEPARTMENT_OTHER): Payer: Self-pay

## 2024-07-21 ENCOUNTER — Other Ambulatory Visit (HOSPITAL_BASED_OUTPATIENT_CLINIC_OR_DEPARTMENT_OTHER): Payer: Self-pay
